# Patient Record
Sex: Male | Born: 1970 | Race: White | Hispanic: No | Marital: Married | Smoking: Never smoker
Health system: Southern US, Community
[De-identification: ages and names within clinical notes are randomized; demographics above are authoritative.]

## PROBLEM LIST (undated history)

## (undated) DIAGNOSIS — D696 Thrombocytopenia, unspecified: Secondary | ICD-10-CM

## (undated) DIAGNOSIS — K219 Gastro-esophageal reflux disease without esophagitis: Secondary | ICD-10-CM

## (undated) DIAGNOSIS — U071 COVID-19: Secondary | ICD-10-CM

## (undated) DIAGNOSIS — G459 Transient cerebral ischemic attack, unspecified: Secondary | ICD-10-CM

## (undated) DIAGNOSIS — E669 Obesity, unspecified: Secondary | ICD-10-CM

## (undated) DIAGNOSIS — G4730 Sleep apnea, unspecified: Secondary | ICD-10-CM

## (undated) DIAGNOSIS — K7460 Unspecified cirrhosis of liver: Secondary | ICD-10-CM

## (undated) DIAGNOSIS — D649 Anemia, unspecified: Secondary | ICD-10-CM

## (undated) DIAGNOSIS — M109 Gout, unspecified: Secondary | ICD-10-CM

## (undated) DIAGNOSIS — E7800 Pure hypercholesterolemia, unspecified: Secondary | ICD-10-CM

## (undated) DIAGNOSIS — E119 Type 2 diabetes mellitus without complications: Secondary | ICD-10-CM

## (undated) DIAGNOSIS — M549 Dorsalgia, unspecified: Principal | ICD-10-CM

## (undated) DIAGNOSIS — T8859XA Other complications of anesthesia, initial encounter: Secondary | ICD-10-CM

## (undated) DIAGNOSIS — G43909 Migraine, unspecified, not intractable, without status migrainosus: Secondary | ICD-10-CM

## (undated) DIAGNOSIS — I10 Essential (primary) hypertension: Secondary | ICD-10-CM

## (undated) DIAGNOSIS — I700 Atherosclerosis of aorta: Secondary | ICD-10-CM

## (undated) DIAGNOSIS — T7840XA Allergy, unspecified, initial encounter: Secondary | ICD-10-CM

## (undated) DIAGNOSIS — K769 Liver disease, unspecified: Secondary | ICD-10-CM

## (undated) DIAGNOSIS — G8929 Other chronic pain: Secondary | ICD-10-CM

## (undated) HISTORY — DX: Essential (primary) hypertension: I10

## (undated) HISTORY — DX: Dorsalgia, unspecified: M54.9

## (undated) HISTORY — PX: KNEE ARTHROSCOPY: SUR90

## (undated) HISTORY — DX: Allergy, unspecified, initial encounter: T78.40XA

## (undated) HISTORY — DX: Migraine, unspecified, not intractable, without status migrainosus: G43.909

## (undated) HISTORY — DX: Other chronic pain: G89.29

## (undated) HISTORY — PX: CHOLECYSTECTOMY: SHX55

## (undated) HISTORY — DX: Pure hypercholesterolemia, unspecified: E78.00

---

## 2003-01-16 ENCOUNTER — Encounter: Admission: RE | Admit: 2003-01-16 | Discharge: 2003-01-16 | Payer: Self-pay | Admitting: Neurosurgery

## 2003-01-16 ENCOUNTER — Encounter: Payer: Self-pay | Admitting: Neurosurgery

## 2003-02-20 ENCOUNTER — Encounter
Admission: RE | Admit: 2003-02-20 | Discharge: 2003-05-21 | Payer: Self-pay | Admitting: Physical Medicine & Rehabilitation

## 2003-06-12 ENCOUNTER — Encounter
Admission: RE | Admit: 2003-06-12 | Discharge: 2003-09-10 | Payer: Self-pay | Admitting: Physical Medicine & Rehabilitation

## 2003-09-20 ENCOUNTER — Encounter
Admission: RE | Admit: 2003-09-20 | Discharge: 2003-12-19 | Payer: Self-pay | Admitting: Physical Medicine & Rehabilitation

## 2004-08-10 ENCOUNTER — Other Ambulatory Visit: Payer: Self-pay

## 2004-08-19 ENCOUNTER — Ambulatory Visit: Payer: Self-pay | Admitting: Unknown Physician Specialty

## 2004-10-16 ENCOUNTER — Encounter: Payer: Self-pay | Admitting: Unknown Physician Specialty

## 2004-11-15 ENCOUNTER — Encounter: Payer: Self-pay | Admitting: Unknown Physician Specialty

## 2004-12-16 ENCOUNTER — Encounter: Payer: Self-pay | Admitting: Unknown Physician Specialty

## 2005-04-16 ENCOUNTER — Ambulatory Visit: Payer: Self-pay | Admitting: Physician Assistant

## 2006-08-10 ENCOUNTER — Other Ambulatory Visit: Payer: Self-pay

## 2006-08-10 ENCOUNTER — Emergency Department: Payer: Self-pay | Admitting: Emergency Medicine

## 2006-08-12 ENCOUNTER — Ambulatory Visit: Payer: Self-pay | Admitting: Emergency Medicine

## 2006-08-23 ENCOUNTER — Ambulatory Visit: Payer: Self-pay | Admitting: Anesthesiology

## 2006-08-26 ENCOUNTER — Ambulatory Visit: Payer: Self-pay | Admitting: Anesthesiology

## 2006-09-05 ENCOUNTER — Ambulatory Visit: Payer: Self-pay | Admitting: Anesthesiology

## 2006-09-28 ENCOUNTER — Ambulatory Visit: Payer: Self-pay | Admitting: Anesthesiology

## 2006-11-21 ENCOUNTER — Ambulatory Visit: Payer: Self-pay | Admitting: Anesthesiology

## 2007-01-24 ENCOUNTER — Ambulatory Visit: Payer: Self-pay | Admitting: Anesthesiology

## 2007-02-16 ENCOUNTER — Ambulatory Visit: Payer: Self-pay | Admitting: Unknown Physician Specialty

## 2007-06-12 ENCOUNTER — Other Ambulatory Visit: Payer: Self-pay

## 2007-06-12 ENCOUNTER — Ambulatory Visit: Payer: Self-pay | Admitting: Unknown Physician Specialty

## 2007-06-19 ENCOUNTER — Inpatient Hospital Stay: Payer: Self-pay | Admitting: Unknown Physician Specialty

## 2008-10-11 ENCOUNTER — Ambulatory Visit: Payer: Self-pay | Admitting: Family Medicine

## 2009-08-12 ENCOUNTER — Ambulatory Visit: Payer: Self-pay | Admitting: Unknown Physician Specialty

## 2010-08-14 ENCOUNTER — Ambulatory Visit: Payer: Self-pay | Admitting: Internal Medicine

## 2010-09-27 ENCOUNTER — Ambulatory Visit: Payer: Self-pay | Admitting: Internal Medicine

## 2010-10-14 ENCOUNTER — Ambulatory Visit: Payer: Self-pay

## 2011-02-07 ENCOUNTER — Emergency Department: Payer: Self-pay | Admitting: Emergency Medicine

## 2011-02-26 ENCOUNTER — Ambulatory Visit: Payer: Self-pay | Admitting: Unknown Physician Specialty

## 2011-03-16 ENCOUNTER — Ambulatory Visit: Payer: Self-pay | Admitting: Unknown Physician Specialty

## 2011-08-19 ENCOUNTER — Ambulatory Visit: Payer: Self-pay | Admitting: Internal Medicine

## 2011-09-10 ENCOUNTER — Ambulatory Visit: Payer: Self-pay | Admitting: Internal Medicine

## 2011-11-04 ENCOUNTER — Ambulatory Visit: Payer: Self-pay | Admitting: Internal Medicine

## 2012-01-11 ENCOUNTER — Ambulatory Visit: Payer: Self-pay | Admitting: Urology

## 2012-01-11 LAB — POTASSIUM: Potassium: 3.9 mmol/L (ref 3.5–5.1)

## 2012-01-19 ENCOUNTER — Ambulatory Visit: Payer: Self-pay | Admitting: Urology

## 2012-05-31 ENCOUNTER — Ambulatory Visit: Payer: Self-pay | Admitting: Pain Medicine

## 2012-08-21 ENCOUNTER — Other Ambulatory Visit: Payer: Self-pay | Admitting: Neurosurgery

## 2012-08-21 DIAGNOSIS — M545 Low back pain, unspecified: Secondary | ICD-10-CM

## 2012-08-23 ENCOUNTER — Ambulatory Visit
Admission: RE | Admit: 2012-08-23 | Discharge: 2012-08-23 | Disposition: A | Discharge status: Home / Self Care | Payer: BC Managed Care – PPO | Source: Ambulatory Visit | Attending: Neurosurgery | Admitting: Neurosurgery

## 2012-08-23 VITALS — BP 135/83 | HR 67

## 2012-08-23 DIAGNOSIS — M545 Low back pain, unspecified: Secondary | ICD-10-CM

## 2012-08-23 MED ORDER — DIAZEPAM 5 MG PO TABS
10.0000 mg | ORAL_TABLET | Freq: Once | ORAL | Status: AC
  Administered 2012-08-23: 10 mg via ORAL

## 2012-08-23 MED ORDER — HYDROMORPHONE HCL PF 2 MG/ML IJ SOLN
2.0000 mg | Freq: Once | INTRAMUSCULAR | Status: AC
  Administered 2012-08-23: 2 mg via INTRAMUSCULAR

## 2012-08-23 MED ORDER — OXYCODONE-ACETAMINOPHEN 5-325 MG PO TABS
2.0000 | ORAL_TABLET | Freq: Once | ORAL | Status: AC
  Administered 2012-08-23: 2 via ORAL

## 2012-08-23 MED ORDER — IOHEXOL 180 MG/ML  SOLN: 15.0000 mL | Freq: Once | INTRAMUSCULAR | Status: AC | PRN

## 2012-08-23 MED ORDER — ONDANSETRON HCL 4 MG/2ML IJ SOLN
4.0000 mg | Freq: Once | INTRAMUSCULAR | Status: AC
  Administered 2012-08-23: 4 mg via INTRAMUSCULAR

## 2012-09-11 ENCOUNTER — Telehealth: Payer: Self-pay | Admitting: Internal Medicine

## 2012-09-11 ENCOUNTER — Emergency Department: Payer: Self-pay | Admitting: Emergency Medicine

## 2012-09-11 LAB — ETHANOL
Ethanol %: <0.003 % (ref 0.000–0.080)
Ethanol: <3 mg/dL

## 2012-09-11 LAB — SALICYLATE LEVEL: Salicylates, Serum: <1.7 mg/dL

## 2012-09-11 LAB — COMPREHENSIVE METABOLIC PANEL
Albumin: 5 g/dL (ref 3.4–5.0)
Alkaline Phosphatase: 55 U/L (ref 50–136)
Anion Gap: 11 (ref 7–16)
BUN: 9 mg/dL (ref 7–18)
Bilirubin,Total: 1.6 mg/dL — ABNORMAL HIGH (ref 0.2–1.0)
Calcium, Total: 9.5 mg/dL (ref 8.5–10.1)
Chloride: 102 mmol/L (ref 98–107)
Co2: 27 mmol/L (ref 21–32)
Creatinine: 1.08 mg/dL (ref 0.60–1.30)
EGFR (African American): >60
EGFR (Non-African Amer.): >60
Glucose: 112 mg/dL — ABNORMAL HIGH (ref 65–99)
Osmolality: 279 (ref 275–301)
Potassium: 3.6 mmol/L (ref 3.5–5.1)
SGOT(AST): 50 U/L — ABNORMAL HIGH (ref 15–37)
SGPT (ALT): 80 U/L — ABNORMAL HIGH (ref 12–78)
Sodium: 140 mmol/L (ref 136–145)
Total Protein: 9 g/dL — ABNORMAL HIGH (ref 6.4–8.2)

## 2012-09-11 LAB — ACETAMINOPHEN LEVEL: Acetaminophen: <2 ug/mL

## 2012-09-11 LAB — DRUG SCREEN, URINE
Amphetamines, Ur Screen: NEGATIVE (ref ?–1000)
Barbiturates, Ur Screen: NEGATIVE (ref ?–200)
Benzodiazepine, Ur Scrn: NEGATIVE (ref ?–200)
Cannabinoid 50 Ng, Ur ~~LOC~~: NEGATIVE (ref ?–50)
Cocaine Metabolite,Ur ~~LOC~~: NEGATIVE (ref ?–300)
MDMA (Ecstasy)Ur Screen: NEGATIVE (ref ?–500)
Methadone, Ur Screen: NEGATIVE (ref ?–300)
Opiate, Ur Screen: POSITIVE (ref ?–300)
Phencyclidine (PCP) Ur S: NEGATIVE (ref ?–25)
Tricyclic, Ur Screen: NEGATIVE (ref ?–1000)

## 2012-09-11 LAB — CBC
HCT: 46 % (ref 40.0–52.0)
HGB: 16 g/dL (ref 13.0–18.0)
MCH: 30.1 pg (ref 26.0–34.0)
MCHC: 34.8 g/dL (ref 32.0–36.0)
MCV: 87 fL (ref 80–100)
Platelet: 261 10*3/uL (ref 150–440)
RBC: 5.31 10*6/uL (ref 4.40–5.90)
RDW: 12.8 % (ref 11.5–14.5)
WBC: 8.7 10*3/uL (ref 3.8–10.6)

## 2012-09-11 LAB — TSH: Thyroid Stimulating Horm: 1.88 u[IU]/mL

## 2012-09-11 NOTE — Telephone Encounter (Signed)
Pt called to see if he could be soon. His dr at [facility] left dr Bernette Redbird , todd Monday Pt had to go to er this morning to get pain meds. Pt stated he has not ate anything in 4 days

## 2012-09-11 NOTE — Telephone Encounter (Signed)
1. Patient stated that both of his back doctors have left [facility], he stated that he cannot get any pain meds until he sees a Psychiatrist.   2. He stated that he hasn't eaten anything in four days, went to ED this morning to see if he could get pain meds for back pain, they gave him twelve tablets of Vicodin and he stated that after those pills he doesn't know what else to do.   3. He states that he cannot sleep, he continues to think about his girlfriend.  He is staying out of work and he cannot concentrate.  Sounds like patient may be suffering from severe depression.  I spoke with Dr. Lorin Picket and she will call patient.  Patient can be reached on his cell phone at [phone].

## 2012-09-11 NOTE — Telephone Encounter (Signed)
I spoke to pt.  Discussed his current situation.  He informed me that since Dr Gerrit Heck left, he has been seeing another physician at [facility] (ortho).  They informed him they were going to stop prescribing pain meds and referred him to pain clinic in [place].  He was evaluated at Pain Clinic and was instructed to see a psychiatrist.  States he has called that psychiatrist three times and has been unable to arrange an appt.  He has subsequently followed up with Dr Gerlene Fee.  He rec pain management in [place] - for injections.  Pt is hesitant to have more injections given his "experience with previous injections".  He is seeing a psychiatrist now (through his work).  He has an appt with him tomorrow.  He does plan to keep the appt.  He was evaluated in the ER today because of increased pain and need of pain meds.  When asked if he was suicidal - he stated he had previously thought about suicide.  He explained to me that he did not and does not have a plan or desire to hurt or kill himself.  States that he was just frustrated with the pain.  He assures me that he would not and has no desire to hurt himself.  He is having some issues with his current relationship.  His girlfriend has moved out.  He is upset about this.  Plans to discuss this more with psychiatry tomorrow.  We discussed a possible plan of seeing the psychiatrist through work and seeing if he could work with the pain clinic physician.  He plans to discuss this with psychiatry tomorrow.  He is also going to call pain clinic tomorrow and explain the above situation.  He did agree to stay with his father tonight.  He stated he was comfortable with this plan.  Info provided for 24 hour coverage if needed for depression, etc issues.

## 2012-09-12 ENCOUNTER — Telehealth: Payer: Self-pay | Admitting: Internal Medicine

## 2012-09-12 NOTE — Telephone Encounter (Signed)
Pt went to Therapist but the therapist does not per scribe rx's. Therapist says pt is going through depression and needed to contact you for getting an rx. Pt says he isn't eating and vomiting because of the pain pills. [facility] [address]. We wanting to have something for the depression as soon as possible because he isn't doing so well.

## 2012-09-12 NOTE — Telephone Encounter (Signed)
Have tried multiple times to reach pt.  Unable.

## 2012-09-13 ENCOUNTER — Emergency Department: Payer: Self-pay | Admitting: Emergency Medicine

## 2012-09-13 LAB — COMPREHENSIVE METABOLIC PANEL
Albumin: 4.8 g/dL (ref 3.4–5.0)
Alkaline Phosphatase: 63 U/L (ref 50–136)
Anion Gap: 10 (ref 7–16)
BUN: 10 mg/dL (ref 7–18)
Bilirubin,Total: 1.5 mg/dL — ABNORMAL HIGH (ref 0.2–1.0)
Calcium, Total: 9.7 mg/dL (ref 8.5–10.1)
Chloride: 100 mmol/L (ref 98–107)
Co2: 27 mmol/L (ref 21–32)
Creatinine: 0.94 mg/dL (ref 0.60–1.30)
EGFR (African American): >60
EGFR (Non-African Amer.): >60
Glucose: 94 mg/dL (ref 65–99)
Osmolality: 273 (ref 275–301)
Potassium: 3.7 mmol/L (ref 3.5–5.1)
SGOT(AST): 27 U/L (ref 15–37)
SGPT (ALT): 71 U/L (ref 12–78)
Sodium: 137 mmol/L (ref 136–145)
Total Protein: 8.7 g/dL — ABNORMAL HIGH (ref 6.4–8.2)

## 2012-09-13 LAB — DRUG SCREEN, URINE
Amphetamines, Ur Screen: NEGATIVE (ref ?–1000)
Barbiturates, Ur Screen: NEGATIVE (ref ?–200)
Benzodiazepine, Ur Scrn: NEGATIVE (ref ?–200)
Cannabinoid 50 Ng, Ur ~~LOC~~: NEGATIVE (ref ?–50)
Cocaine Metabolite,Ur ~~LOC~~: NEGATIVE (ref ?–300)
MDMA (Ecstasy)Ur Screen: NEGATIVE (ref ?–500)
Methadone, Ur Screen: NEGATIVE (ref ?–300)
Opiate, Ur Screen: POSITIVE (ref ?–300)
Phencyclidine (PCP) Ur S: NEGATIVE (ref ?–25)
Tricyclic, Ur Screen: NEGATIVE (ref ?–1000)

## 2012-09-13 LAB — AMYLASE: Amylase: 61 U/L (ref 25–115)

## 2012-09-13 LAB — CBC WITH DIFFERENTIAL/PLATELET
Basophil #: 0.1 10*3/uL (ref 0.0–0.1)
Basophil %: 1 %
Eosinophil #: 0.1 10*3/uL (ref 0.0–0.7)
Eosinophil %: 1.1 %
HCT: 45.3 % (ref 40.0–52.0)
HGB: 15.7 g/dL (ref 13.0–18.0)
Lymphocyte #: 3.8 10*3/uL — ABNORMAL HIGH (ref 1.0–3.6)
Lymphocyte %: 40.6 %
MCH: 30.1 pg (ref 26.0–34.0)
MCHC: 34.8 g/dL (ref 32.0–36.0)
MCV: 87 fL (ref 80–100)
Monocyte #: 0.9 x10 3/mm (ref 0.2–1.0)
Monocyte %: 10.1 %
Neutrophil #: 4.4 10*3/uL (ref 1.4–6.5)
Neutrophil %: 47.2 %
Platelet: 214 10*3/uL (ref 150–440)
RBC: 5.23 10*6/uL (ref 4.40–5.90)
RDW: 12.8 % (ref 11.5–14.5)
WBC: 9.4 10*3/uL (ref 3.8–10.6)

## 2012-09-13 LAB — URINALYSIS, COMPLETE
Bacteria: NONE SEEN
Bilirubin,UR: NEGATIVE
Blood: NEGATIVE
Glucose,UR: NEGATIVE mg/dL (ref 0–75)
Ketone: NEGATIVE
Leukocyte Esterase: NEGATIVE
Nitrite: NEGATIVE
Ph: 6 (ref 4.5–8.0)
Protein: NEGATIVE
RBC,UR: <1 /HPF (ref 0–5)
Specific Gravity: 1.017 (ref 1.003–1.030)
Squamous Epithelial: NONE SEEN
WBC UR: <1 /HPF (ref 0–5)

## 2012-09-13 LAB — ETHANOL
Ethanol %: <0.003 % (ref 0.000–0.080)
Ethanol: <3 mg/dL

## 2012-09-13 LAB — LIPASE, BLOOD: Lipase: 398 U/L — ABNORMAL HIGH (ref 73–393)

## 2012-09-13 NOTE — Telephone Encounter (Signed)
Pt returned your call  He stated to try number [phone]

## 2012-09-13 NOTE — Telephone Encounter (Signed)
I called Carollee Herter to discuss current symptoms.  Please also refer to my previous phone message.  He saw Dr Letta Moynahan today.  Dr Letta Moynahan is going to contact pain management (Dr Shirlyn Goltz) and let him know pt followed through with the appt.  Pt concerned he is out of pain meds.  See my last note regarding plan for follow up at pain clinic.  He contacted pain clinic late this pm.  He is concerned regarding not eating and not being able to keep po liquids or food down.  He is also having diarrhea.  States he hasn't eaten or been able to drink for the past few days.  Discussed current clinical condition.  Discussed withdrawal symptoms and also discussed other possible etiologies.  I do agree with need for evaluation today.  May need fluids, detox, and further testing to confirm nothing else contributing to these symptoms.  He states he discussed these current symptoms with Dr Letta Moynahan.  Discussed with him about withdrawal symptoms.  He does not feel these symptoms are coming from depression.  He denies any suicidal ideations.  Expressed to me that his main concern was "how he was going to get his pain meds".   I expressed my concern regarding the not eating and not sleeping.  Advised him - he needed evaluation today.  He agreed to go to acute care for evaluation. I explained to him that acute care may assess him and refer him over to ER for evaluation and workup.  He was in agreement with this plan.  Needs to contact pain clinics office as well.

## 2012-09-13 NOTE — Telephone Encounter (Signed)
Caller: [name]/Patient; Patient Name: Cameron Sanchez; PCP: Dale [place]; Best Callback Phone Number: [phone]; Calling today 09/13/12 regarding missed call from MD, cell phone not working.  Triager pulled chart in EPIC and see message from yesterday regarding MD trying to reach pt.  No note left in chart for triage RN to relay to pt.  Going through withdrawals because does not have pain medication, not eating.  Can't keep fluids down.  Has been trying to contact MD to get refill or instructions on what to do  Keep missing calls from MD. Emergent symptoms r/o by Withdrawal Symptoms guidelines with exception of continuous or repeated vomiting for more than 8 hours AND unable to keep any fluids down.  Pt has flat affect to his voice. (See Provider Within 4 Hours).  Care advice given.  No appts available in office.  OFFICE PLEASE CALL PT BACK AT [phone] TO ADVISE IF CAN BE WORKED IN OR MD IS GOING TO CALL IN MEDICATION FOR HIM.

## 2012-09-14 ENCOUNTER — Telehealth: Payer: Self-pay

## 2012-09-14 ENCOUNTER — Telehealth: Payer: Self-pay | Admitting: Internal Medicine

## 2012-09-14 ENCOUNTER — Telehealth: Payer: Self-pay | Admitting: *Deleted

## 2012-09-14 NOTE — Telephone Encounter (Signed)
Patient called and he wishes to speck to you.

## 2012-09-14 NOTE — Telephone Encounter (Signed)
Notify pt that I called pain clinic and explained the situation.  They are supposed to be calling him with an appt.  I also need the ER records from last night - states he went to [facility] last night.  (to review) - to see if they gave him anything for nausea, etc.

## 2012-09-14 NOTE — Telephone Encounter (Signed)
Did  give him something for nausea, but nothing else. He wants a call back today with your reply.

## 2012-09-14 NOTE — Telephone Encounter (Signed)
Patient called about what he should do he has not had anything to eat in 3 days because he does not have an appetite  i  informed him of your notes. And he is not vomitting just only drinking water.

## 2012-09-14 NOTE — Telephone Encounter (Signed)
See other message.  Can conclude this one.

## 2012-09-14 NOTE — Telephone Encounter (Signed)
Pt is calling back he went to Acute care and they sent him to hospital. He is saying that they still haven't done anything for him and he is till not eating anything. He is not sure what to do at this point ??? He does apologize for having to keep calling back but he is scared because he isn't eating and he knows his pain meds are causing some of the problem.

## 2012-09-14 NOTE — Telephone Encounter (Signed)
Dr.Scott,  Cameron Sanchez called and stated that he is still unable drink and eat.  He went to [facility], and said they did nothing for him. How should I handle this?  Patients DOB: 11/08/[phone]

## 2012-09-14 NOTE — Telephone Encounter (Signed)
See my other message on this patient.  He called this am also with same question.  Thanks.

## 2012-09-14 NOTE — Telephone Encounter (Signed)
Called pt and discussed the current situation.  He is drinking some fluids now.  Informed him to get gatorade or pediolyte and sip.  Try to start eating crackers/toast, etc.  He does have an appt with pain clinic next Tuesday.  I explained to him that I would contact ortho tomorrow and make them aware of the situation.  He informed me that a psychiatrist saw him last night in the ER and mentioned detox.  He was subsequently discharged with (per pt) an unknown plan.  I discussed with him that if his symptoms worsened - I did want him to return to er for eval/treatment and detox.  He feels this is all related to not having the pain meds.  No suicidal ideations.  Will be evaluated if problems.

## 2012-09-14 NOTE — Telephone Encounter (Signed)
Notify pt i will review the er records and see what they checked, etc.   I did explain the situation to pain clinic. They will notify him of an appt.  Have him take the med if needed for nausea and try sipping clear fluids and eating bland foods.  I will also notify ortho of the situation since Dr Gerrit Heck had previously been following.  If any acute change or problems rec reevaluation.

## 2012-09-15 ENCOUNTER — Telehealth: Payer: Self-pay | Admitting: Internal Medicine

## 2012-09-15 NOTE — Telephone Encounter (Signed)
Notify pt that I did review er records.  Labs looked good.  As we discussed last night - if he is continuing to have these problems - I do rec another evaluation - had stated would go to er if worsened.  rec to er for eval.  If he asked about ortho, let him know I did call them and made them aware of the situation.

## 2012-09-15 NOTE — Telephone Encounter (Signed)
Advised patient per Dr.Scott. Patient stated that he was going to seek more eval at [facility]

## 2012-09-15 NOTE — Telephone Encounter (Signed)
Caller: Kadeem/Patient; Patient Name: Cameron Sanchez; PCP: Dale [place]; Best Callback Phone Number: [phone]. States that he has been talking to Dr. Lorin Picket about vomiting and other symptoms this week. Is still not able to keep down any food. Is feeling weak and having cramping to abdomen and legs. Last urination was this morning at 0930. Has had half of a sweet tea from [employer] that was brought to him this morning. Is dizzy upon standing. States that someone from office was supposed to call him regarding recent ED visit. States that Dr. Lorin Picket told him she would get records from ED, review them and call him back. No one has called him.  Has not been able to eat in 5 days. Reviewed notes in EPIC. Advised I would send this note to office for follow up. OFFICE PLEASE FOLLOW UP WITH PATIENT TODAY.

## 2012-09-19 ENCOUNTER — Ambulatory Visit: Payer: Self-pay | Admitting: Pain Medicine

## 2012-10-05 ENCOUNTER — Ambulatory Visit: Payer: Self-pay | Admitting: Pain Medicine

## 2012-10-06 ENCOUNTER — Telehealth: Payer: Self-pay | Admitting: Internal Medicine

## 2012-10-06 NOTE — Telephone Encounter (Signed)
Caller: Gavriel/Patient; Phone: [phone]; Reason for Call: Patient following Dr.  Lorin Picket from [facility]; states needs refill of BP medication but has not been seen in this office yet.  Caller transferred to staff for assistance.  Krs/can

## 2012-10-06 NOTE — Telephone Encounter (Signed)
Spoke with Cameron Sanchez and explained to him that I was not sure why the do not schedule message had been entered into the record and would follow-up with the person that entered the message on Monday. advised him that Dr. Lorin Picket was not here and that [facility] should refill his BP med because he has not established with Dr. Lorin Picket he stated that he had called and  they would not refill. Advised that he could be seen at an Urgent Care for his BP and they would refill his medication. He is agreeable and  will be seen at a local Urgent Care.

## 2012-10-07 NOTE — Telephone Encounter (Signed)
This pt had a note on his chart to "not schedule with [employer]  Ermalinda Barrios spoke to him on Friday.  I am not sure if she left a message with you regarding this pt.  Thanks.

## 2012-10-09 ENCOUNTER — Telehealth: Payer: Self-pay | Admitting: Internal Medicine

## 2012-10-09 NOTE — Telephone Encounter (Signed)
I spoke with Cameron Sanchez today and advised him that Dr. Lorin Picket would see him on Wednesday. He states he is completely out of his Blood pressure medicine. [facility] [place] and he takes topral 50mg  and Tamazin HCTZ 37.5 mg. Please call in this medication.

## 2012-10-10 ENCOUNTER — Other Ambulatory Visit: Payer: Self-pay | Admitting: *Deleted

## 2012-10-10 NOTE — Telephone Encounter (Signed)
Called prescription in to pharmacy 

## 2012-10-11 ENCOUNTER — Ambulatory Visit: Payer: BC Managed Care – PPO | Admitting: Internal Medicine

## 2012-10-30 ENCOUNTER — Encounter: Payer: Self-pay | Admitting: *Deleted

## 2012-10-30 DIAGNOSIS — I10 Essential (primary) hypertension: Secondary | ICD-10-CM

## 2012-10-30 DIAGNOSIS — E7800 Pure hypercholesterolemia, unspecified: Secondary | ICD-10-CM

## 2012-10-30 DIAGNOSIS — M549 Dorsalgia, unspecified: Secondary | ICD-10-CM

## 2012-10-30 DIAGNOSIS — G8929 Other chronic pain: Secondary | ICD-10-CM

## 2012-10-31 ENCOUNTER — Telehealth: Payer: Self-pay | Admitting: Internal Medicine

## 2012-10-31 ENCOUNTER — Ambulatory Visit: Payer: BC Managed Care – PPO | Admitting: Internal Medicine

## 2012-10-31 ENCOUNTER — Encounter: Payer: Self-pay | Admitting: *Deleted

## 2012-10-31 DIAGNOSIS — E7800 Pure hypercholesterolemia, unspecified: Secondary | ICD-10-CM | POA: Insufficient documentation

## 2012-10-31 DIAGNOSIS — G8929 Other chronic pain: Secondary | ICD-10-CM | POA: Insufficient documentation

## 2012-10-31 DIAGNOSIS — I10 Essential (primary) hypertension: Secondary | ICD-10-CM | POA: Insufficient documentation

## 2012-10-31 NOTE — Telephone Encounter (Signed)
Pt is calling and cancelling appt again. He says he was called into work and is just not calling at 8:45 am to cancel. He is wanting his b/p meds refilled and we told him the next new patient was in in march but we would ask you to see what we needed to do.

## 2012-10-31 NOTE — Telephone Encounter (Signed)
Made appointment for patient. Called to let him know and also let him know that he needs to keep this appointment or we will not continue to offer care.

## 2012-10-31 NOTE — Telephone Encounter (Signed)
I can see him at 11:30 on 11/22/12.  He needs to keep this appt.  I am not going to continue to refill medication and not see him.  If he can come on this day - will need to schedule appt.

## 2012-11-22 ENCOUNTER — Encounter: Payer: Self-pay | Admitting: Internal Medicine

## 2012-11-22 ENCOUNTER — Encounter: Payer: Self-pay | Admitting: *Deleted

## 2012-11-22 ENCOUNTER — Ambulatory Visit (INDEPENDENT_AMBULATORY_CARE_PROVIDER_SITE_OTHER): Payer: BC Managed Care – PPO | Admitting: Internal Medicine

## 2012-11-22 VITALS — BP 112/60 | HR 83 | Temp 98.5°F | Ht 68.0 in | Wt 186.2 lb

## 2012-11-22 DIAGNOSIS — E7800 Pure hypercholesterolemia, unspecified: Secondary | ICD-10-CM

## 2012-11-22 DIAGNOSIS — M549 Dorsalgia, unspecified: Secondary | ICD-10-CM

## 2012-11-22 DIAGNOSIS — R5383 Other fatigue: Secondary | ICD-10-CM

## 2012-11-22 DIAGNOSIS — G8929 Other chronic pain: Secondary | ICD-10-CM

## 2012-11-22 DIAGNOSIS — R5381 Other malaise: Secondary | ICD-10-CM

## 2012-11-22 DIAGNOSIS — R079 Chest pain, unspecified: Secondary | ICD-10-CM

## 2012-11-22 DIAGNOSIS — I10 Essential (primary) hypertension: Secondary | ICD-10-CM

## 2012-11-24 ENCOUNTER — Other Ambulatory Visit: Payer: Self-pay | Admitting: *Deleted

## 2012-11-24 MED ORDER — AMOXICILLIN 875 MG PO TABS: 875.0000 mg | ORAL_TABLET | Freq: Two times a day (BID) | ORAL | Status: DC

## 2012-11-24 MED ORDER — FLUTICASONE PROPIONATE 50 MCG/ACT NA SUSP: 2.0000 | Freq: Every day | NASAL | Status: DC

## 2012-11-24 NOTE — Telephone Encounter (Signed)
Called prescription in to pharmacy 

## 2012-11-27 ENCOUNTER — Other Ambulatory Visit: Payer: BC Managed Care – PPO

## 2012-11-27 ENCOUNTER — Encounter: Payer: Self-pay | Admitting: Internal Medicine

## 2012-11-27 NOTE — Assessment & Plan Note (Signed)
Has not been watching what he eats.  Low cholesterol diet and exercise.  Check lipid panel.

## 2012-11-27 NOTE — Assessment & Plan Note (Signed)
Blood pressure under good control.  Same medication regimen.  Check metabolic panel.    

## 2012-11-27 NOTE — Assessment & Plan Note (Signed)
Now seeing Dr Gerrit Heck.  Discussed with him today regarding the need to cut back on the narcotics.  We have discussed detox.  He is now seeing Dr Gerrit Heck.

## 2012-11-27 NOTE — Progress Notes (Signed)
Subjective:    [name], male    DOB: 1970-12-08, 42 y.o.   MRN: [id number]  HPI 42 year old male with past history of hypertension, hypercholesterolemia, sleep apnea and chronic back pain who comes in today for a scheduled follow up.  He has been going to pain clinic.  Received two injections.  Did not help.  Now is seeing Dr Gerrit Heck.  He hurt his back at work.  He is currently under workman's comp for this.  Dr Gerrit Heck is giving him hydrocodone.  He apparently is out now and is taking some oxycodone.  He can't have his hydrocodone filled until 11/28/12.  We discussed the need to cut down the amount of narcotics he is taking.  Have tried to get him in detox.  His back is doing some better from the injury, but still has the chronic pain issues.  He also states that starting 11/04/12 he developed body aches, ears stopped up, increased congestion and cough.  Was seen at urgent care and given an abx and cough medication.  He is better, but still is having increased cough and congestion and drainage.    He also reports that on 11/15/12, he was driving and developed chest pain and left arm pain.  Pulled over.  Pain persisted.  He went to work and pain started easing off.  He has noticed getting winded with increased activity or exertion.  Had not had any other episodes of chest pain since occurrence, until sitting in the office today.  The initial EKG - no pain.  A second EKG was done secondary to some mild chest and arm discomfort he experienced while sitting here in the office.  Pain resolved quickly.  Declined ER evaluation.  Did agree to cardiology evaluation and they agreed to see him today.    Past Medical History  Diagnosis Date  . Migraines   . Allergy   . Hypertension   . Hypercholesterolemia   . Chronic back pain     Current Outpatient Prescriptions on File Prior to Visit  Medication Sig Dispense Refill  . carisoprodol (SOMA) 350 MG tablet Take 350 mg by mouth 4 (four) times daily  as needed.      . doxepin (SINEQUAN) 10 MG capsule Take 10 mg by mouth daily.      . fish oil-omega-3 fatty acids 1000 MG capsule Take 2 g by mouth daily.      . metoprolol succinate (TOPROL-XL) 50 MG 24 hr tablet Take 50 mg by mouth daily. Take with or immediately following a meal.      . oxyCODONE (OXYCONTIN) 20 MG 12 hr tablet Take 20 mg by mouth 2 (two) times daily.      Marland Kitchen triamterene-hydrochlorothiazide (MAXZIDE-25) 37.5-25 MG per tablet Take 1 tablet by mouth daily.      . fluticasone (FLONASE) 50 MCG/ACT nasal spray Place 2 sprays into the nose daily.  16 g  5  . gabapentin (NEURONTIN) 300 MG capsule Take 300 mg by mouth at bedtime. 2 pills at bedtime      . hydrocodone-acetaminophen (LORCET-HD) 5-500 MG per capsule Take 1 capsule by mouth every 6 (six) hours as needed.        Review of Systems Patient denies any headache, lightheadedness or dizziness. Does report the increased congestion, cough and drainage. Ears stopped up.  Describes the chest pain as outlined.  No sob associated.  Does report increased sob with exertion.  No nausea or vomiting.  No abdominal  pain or cramping.  No bowel change, such as diarrhea, constipation, BRBPR or melana.  No urine change.        Objective:   Physical Exam Filed Vitals:   11/22/12 1156  BP: 112/60  Pulse: 83  Temp: 98.5 F (39.74 C)   42 year old male in no acute distress.   HEENT:  Nares - clear.  OP- without lesions or erythema.  NECK:  Supple, nontender.  No audible bruit.   HEART:  Appears to be regular. LUNGS:  Without crackles or wheezing audible.  Respirations even and unlabored.   RADIAL PULSE:  Equal bilaterally.  ABDOMEN:  Soft, nontender.  No audible abdominal bruit.   EXTREMITIES:  No increased edema to be present.                  Assessment & Plan:  CARDIOVASCULAR.  Chest pain as outlined.  EKG x 2 revealed no acute ischemic changes.  Has risk factors.  Discussed at length with him today regarding the need for further  cardiac w/up.  He declined ER evaluation today.  Did agree to see cardiology.  Cardiology agreed to see him today.  He was pain free prior to leaving the office and agreed if he had any more pain - he would be evaluated in the ER.  Seeing Rudi Coco today.    PROBABLE SINUSITIS/URI.  Already partially treated -with residual symptoms.  Extend out the abx.  Will prescribe amoxicillin 875mg  bid x 1 week.  Flonase nasal spray as directed.    FATIGUE.  Check cbc, met c and tsh.

## 2013-01-01 ENCOUNTER — Telehealth: Payer: Self-pay | Admitting: Internal Medicine

## 2013-01-01 NOTE — Telephone Encounter (Signed)
Appointment scheduled for 01/02/13.

## 2013-01-01 NOTE — Telephone Encounter (Signed)
Patient Information:  Caller Name: Lukas  Phone: [phone]  Patient: Cameron Sanchez, Cameron Sanchez  Gender: Male  DOB: Mar 20, 1971  Age: 42 Years  PCP: [facility] [place]  Office Follow Up:  Does the office need to follow up with this patient?: No  Instructions For The Office: N/A  RN Note:  No appt found for today (01/01/13); Rn looked for appt for tomorrow (01/02/13) and pt needed late appt because of work.  Symptoms  Reason For Call & Symptoms: pt reports he has been to the walk in clinic twice in [place].  Pt reports he has been on antibiotic for a week and it is getting worse  Reviewed Health History In EMR: Yes  Reviewed Medications In EMR: Yes  Reviewed Allergies In EMR: Yes  Reviewed Surgeries / Procedures: Yes  Date of Onset of Symptoms: 12/20/2012  Treatments Tried: antibiotic, Mucinex  Treatments Tried Worked: No  Guideline(s) Used:  Earache  Disposition Per Guideline:   See Today in Office  Reason For Disposition Reached:   All other earaches (Exceptions: earache lasting < 1 hour, and earache from air travel)  Advice Given:  Pain Medicines:  For pain relief, you can take either acetaminophen, ibuprofen, or naproxen.  Call Back If  You become worse.  Appointment Scheduled:  01/02/2013 15:00:00 Appointment Scheduled Provider:  Orville Govern

## 2013-01-02 ENCOUNTER — Ambulatory Visit: Payer: Self-pay | Admitting: Adult Health

## 2013-01-08 ENCOUNTER — Ambulatory Visit: Payer: BC Managed Care – PPO | Admitting: Internal Medicine

## 2013-02-20 ENCOUNTER — Ambulatory Visit: Payer: BC Managed Care – PPO | Admitting: Internal Medicine

## 2013-04-12 ENCOUNTER — Other Ambulatory Visit: Payer: Self-pay | Admitting: Internal Medicine

## 2013-04-12 NOTE — Telephone Encounter (Signed)
He missed his last appt with me and he has not shown up for his labs.  The labs are ordered.  He needs to come in within the next week to have his labs checked.  Also he needs a f/u appt with me.  I will refill the meds #30 with no refills - to give him time to make these appts.  Sent in to pharmacy.  Please notify pt of need for appts.

## 2013-04-12 NOTE — Telephone Encounter (Signed)
Patient wants refills on metoprolol succinate and triamterene-hydrochlorothiazide. Is it ok to refill both or just one?

## 2013-05-16 ENCOUNTER — Other Ambulatory Visit: Payer: Self-pay | Admitting: Internal Medicine

## 2013-08-23 ENCOUNTER — Emergency Department (HOSPITAL_COMMUNITY): Payer: Worker's Compensation

## 2013-08-23 ENCOUNTER — Encounter (HOSPITAL_COMMUNITY): Payer: Self-pay | Admitting: Emergency Medicine

## 2013-08-23 ENCOUNTER — Emergency Department (HOSPITAL_COMMUNITY)
Admission: EM | Admit: 2013-08-23 | Discharge: 2013-08-23 | Disposition: A | Discharge status: Home / Self Care | Payer: Worker's Compensation | Attending: Emergency Medicine | Admitting: Emergency Medicine

## 2013-08-23 DIAGNOSIS — X503XXA Overexertion from repetitive movements, initial encounter: Secondary | ICD-10-CM | POA: Insufficient documentation

## 2013-08-23 DIAGNOSIS — I10 Essential (primary) hypertension: Secondary | ICD-10-CM | POA: Insufficient documentation

## 2013-08-23 DIAGNOSIS — X500XXA Overexertion from strenuous movement or load, initial encounter: Secondary | ICD-10-CM | POA: Insufficient documentation

## 2013-08-23 DIAGNOSIS — G43909 Migraine, unspecified, not intractable, without status migrainosus: Secondary | ICD-10-CM | POA: Insufficient documentation

## 2013-08-23 DIAGNOSIS — S8992XA Unspecified injury of left lower leg, initial encounter: Secondary | ICD-10-CM

## 2013-08-23 DIAGNOSIS — Y929 Unspecified place or not applicable: Secondary | ICD-10-CM | POA: Insufficient documentation

## 2013-08-23 DIAGNOSIS — Z862 Personal history of diseases of the blood and blood-forming organs and certain disorders involving the immune mechanism: Secondary | ICD-10-CM | POA: Insufficient documentation

## 2013-08-23 DIAGNOSIS — Z79899 Other long term (current) drug therapy: Secondary | ICD-10-CM | POA: Insufficient documentation

## 2013-08-23 DIAGNOSIS — S8990XA Unspecified injury of unspecified lower leg, initial encounter: Secondary | ICD-10-CM | POA: Insufficient documentation

## 2013-08-23 DIAGNOSIS — G8929 Other chronic pain: Secondary | ICD-10-CM | POA: Insufficient documentation

## 2013-08-23 DIAGNOSIS — Y9389 Activity, other specified: Secondary | ICD-10-CM | POA: Insufficient documentation

## 2013-08-23 DIAGNOSIS — Z8639 Personal history of other endocrine, nutritional and metabolic disease: Secondary | ICD-10-CM | POA: Insufficient documentation

## 2013-08-23 DIAGNOSIS — Z9889 Other specified postprocedural states: Secondary | ICD-10-CM | POA: Insufficient documentation

## 2013-08-23 MED ORDER — OXYCODONE-ACETAMINOPHEN 5-325 MG PO TABS
2.0000 | ORAL_TABLET | Freq: Once | ORAL | Status: AC
  Administered 2013-08-23: 2 via ORAL
  Filled 2013-08-23: qty 2

## 2013-08-23 MED ORDER — OXYCODONE HCL 10 MG PO TABS: 10.0000 mg | ORAL_TABLET | Freq: Three times a day (TID) | ORAL | Status: DC

## 2013-08-23 NOTE — ED Provider Notes (Signed)
CSN: [id number]     Arrival date & time 08/23/13  1203 History  This chart was scribed for Cameron Helper, PA,  working with Candyce Churn, MD, by Allene Dillon, ED Scribe. This patient was seen in room TR07C/TR07C and the patient's care was started at 12:40 PM.   Chief Complaint  Patient presents with  . Knee Injury   The history is provided by the patient. No language interpreter was used.   HPI Comments: Cameron Sanchez is a 42 y.o. male who presents to the Emergency Department complaining of sudden onset, constant left knee pain which began approximately 2 hours ago. 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 Pt heard a "popping" sound during the incident. Pt has associated swelling. Pt is unable to ambulate. Pt took oxycodone for his chronic back pain 5 hours ago before incident but has not tried any other medication. Pt states he previously had orthoscopic surgery on knee due to previous injury at work. Pt denies numbness or any other symptoms.   Past Medical History  Diagnosis Date  . Migraines   . Allergy   . Hypertension   . Hypercholesterolemia   . Chronic back pain    Past Surgical History  Procedure Laterality Date  . Cholecystectomy    . Back surgery      multiple  . Knee arthroscopy      Dr HBK   Family History  Problem Relation Age of Onset  . Cancer Father     germ cell (retroperitoneal)  . Coronary artery disease      s/p stent (age 67)  . Hypertension Paternal Grandfather   . Kidney cancer Paternal Grandfather   . Diabetes Paternal Grandfather   . Diabetes Maternal Grandfather   . Heart disease Cousin     MI - age 67   History  Substance Use Topics  . Smoking status: Never Smoker   . Smokeless tobacco: Never Used  . Alcohol Use: No    Review of Systems  Musculoskeletal: Positive for gait problem and myalgias.  Skin: Negative for wound.  Neurological: Negative for numbness.    Allergies  Review of patient's allergies  indicates no known allergies.  Home Medications   Current Outpatient Rx  Name  Route  Sig  Dispense  Refill  . carisoprodol (SOMA) 350 MG tablet   Oral   Take 350 mg by mouth 4 (four) times daily as needed for muscle spasms.          Marland Kitchen HYDROcodone-acetaminophen (NORCO/VICODIN) 5-325 MG per tablet   Oral   Take 1 tablet by mouth every 6 (six) hours as needed for pain.         . metoprolol succinate (TOPROL-XL) 50 MG 24 hr tablet   Oral   Take 50 mg by mouth daily. Take with or immediately following a meal.         . Oxycodone HCl 10 MG TABS   Oral   Take 10 mg by mouth 3 (three) times daily.         Marland Kitchen triamterene-hydrochlorothiazide (MAXZIDE-25) 37.5-25 MG per tablet   Oral   Take 1 tablet by mouth daily.          Triage Vitals: BP 159/106  Pulse 76  Temp(Src) 98.6 F (37 C) (Oral)  Resp 12  SpO2 96%  Physical Exam  Nursing note and vitals reviewed. Constitutional: He is oriented to person, place, and time. He appears well-developed and  well-nourished. No distress.  HENT:  Head: Normocephalic and atraumatic.  Right Ear: External ear normal.  Left Ear: External ear normal.  Nose: Nose normal.  Eyes: Conjunctivae are normal.  Neck: Normal range of motion. No tracheal deviation present.  Cardiovascular: Normal rate, regular rhythm and normal heart sounds.   Pulmonary/Chest: Effort normal and breath sounds normal. No stridor.  Abdominal: Soft. He exhibits no distension. There is no tenderness.  Musculoskeletal: Normal range of motion. He exhibits tenderness.  Full ROM to left knee. protuberance over the overlying tibial tuberosity .  Tender to palpation. Moderate tenderness to the infrapatellar region. No obvious deformity.  Negative anterior and posterior draw test. No significant  pain with cavus and varus maneuver.  Neurological: He is alert and oriented to person, place, and time.  Skin: Skin is warm and dry. He is not diaphoretic.  Psychiatric: He has a  normal mood and affect. His behavior is normal.    ED Course  Procedures (including critical care time)  DIAGNOSTIC STUDIES: Oxygen Saturation is 96% on RA, adequate by my interpretation.    COORDINATION OF CARE: 12:44 PM- Pt advised of plan for treatment which includes knee sleeve, crutches, x-ray, percocet and pt agrees.  1:44 PM Xray neg for acute fx/dislocation.  WIll prescribe a short course of pain meds along with RICE therapy.  Ortho referral for further care.  Pt voice understanding.    Medications  oxyCODONE-acetaminophen (PERCOCET/ROXICET) 5-325 MG per tablet 2 tablet (2 tablets Oral Given 08/23/13 1252)     Labs Review Labs Reviewed - No data to display Imaging Review Dg Knee Complete 4 Views Left  08/23/2013   CLINICAL DATA:  Knee popped while lifting.  EXAM: LEFT KNEE - COMPLETE 4+ VIEW  COMPARISON:  None.  FINDINGS: No fracture or dislocation.  No plain film evidence of joint effusion  IMPRESSION: No fracture or dislocation.   Electronically Signed   By: Bridgett Larsson M.D.   On: 08/23/2013 13:16    EKG Interpretation   None       MDM   1. Left knee injury, initial encounter    BP 159/106  Pulse 76  Temp(Src) 98.6 F (37 C) (Oral)  Resp 12  SpO2 96%  I have reviewed nursing notes and vital signs. I personally reviewed the imaging tests through PACS system  I reviewed available ER/hospitalization records thought the EMR  I personally performed the services described in this documentation, which was scribed in my presence. The recorded information has been reviewed and is accurate.     Cameron Helper, PA-C 08/23/13 1345

## 2013-08-23 NOTE — ED Notes (Signed)
Pt felt L knee pop while pushing a cart.  Unable to ambulate d/t pain.  Denies numbness or tingling to lower extremity.

## 2013-08-25 NOTE — ED Provider Notes (Signed)
Medical screening examination/treatment/procedure(s) were performed by non-physician practitioner and as supervising physician I was immediately available for consultation/collaboration.   Halston Kintz David Sam Wunschel, MD 08/25/13 1508 

## 2013-11-12 ENCOUNTER — Other Ambulatory Visit: Payer: Self-pay | Admitting: Internal Medicine

## 2013-11-12 NOTE — Telephone Encounter (Signed)
Last lab 10/13, last visit 11/22/12, needs to be seen first prior to refills?

## 2013-11-12 NOTE — Telephone Encounter (Signed)
He missed his last appt with me.  He needs an appt to be seen and needs labs.  Can schedule fasting labs within one week and appt asap.  Will refill x 1 only if he agrees to appt and labs.  Has to show up to appt or I will not continue to refill

## 2013-11-13 NOTE — Telephone Encounter (Addendum)
Spoke with pt, made aware of need for labs and appt. Agrees to schedule labs and apt with Dr. Lorin Picket. Rx sent to pharmacy by escript for one month only. Made aware he will need to keep appointment for further refills. Fasting lab appt scheduled 11/21/12. Appt with Dr. Lorin Picket 12/12/13. Any additional labs need ordered?

## 2013-11-17 ENCOUNTER — Other Ambulatory Visit: Payer: Self-pay | Admitting: Internal Medicine

## 2013-11-21 ENCOUNTER — Other Ambulatory Visit: Payer: Self-pay | Admitting: Internal Medicine

## 2013-11-21 ENCOUNTER — Other Ambulatory Visit (INDEPENDENT_AMBULATORY_CARE_PROVIDER_SITE_OTHER): Payer: BC Managed Care – PPO

## 2013-11-21 ENCOUNTER — Encounter (INDEPENDENT_AMBULATORY_CARE_PROVIDER_SITE_OTHER): Payer: Self-pay

## 2013-11-21 DIAGNOSIS — R5383 Other fatigue: Secondary | ICD-10-CM

## 2013-11-21 DIAGNOSIS — R5381 Other malaise: Secondary | ICD-10-CM

## 2013-11-21 DIAGNOSIS — R739 Hyperglycemia, unspecified: Secondary | ICD-10-CM

## 2013-11-21 DIAGNOSIS — E7800 Pure hypercholesterolemia, unspecified: Secondary | ICD-10-CM

## 2013-11-21 DIAGNOSIS — R7309 Other abnormal glucose: Secondary | ICD-10-CM

## 2013-11-21 LAB — LIPID PANEL
Cholesterol: 272 mg/dL — ABNORMAL HIGH (ref 0–200)
HDL: 36.6 mg/dL — ABNORMAL LOW (ref >39.00)
Total CHOL/HDL Ratio: 7
Triglycerides: 469 mg/dL — ABNORMAL HIGH (ref 0.0–149.0)
VLDL: 93.8 mg/dL — ABNORMAL HIGH (ref 0.0–40.0)

## 2013-11-21 LAB — COMPREHENSIVE METABOLIC PANEL
ALT: 37 U/L (ref 0–53)
AST: 28 U/L (ref 0–37)
Albumin: 4.8 g/dL (ref 3.5–5.2)
Alkaline Phosphatase: 41 U/L (ref 39–117)
BUN: 9 mg/dL (ref 6–23)
CO2: 27 mEq/L (ref 19–32)
Calcium: 9.7 mg/dL (ref 8.4–10.5)
Chloride: 102 mEq/L (ref 96–112)
Creatinine, Ser: 1.1 mg/dL (ref 0.4–1.5)
GFR: 81.35 mL/min (ref >60.00)
Glucose, Bld: 112 mg/dL — ABNORMAL HIGH (ref 70–99)
Potassium: 4.2 mEq/L (ref 3.5–5.1)
Sodium: 138 mEq/L (ref 135–145)
Total Bilirubin: 1.2 mg/dL (ref 0.3–1.2)
Total Protein: 7.7 g/dL (ref 6.0–8.3)

## 2013-11-21 LAB — CBC WITH DIFFERENTIAL/PLATELET
Basophils Absolute: 0 10*3/uL (ref 0.0–0.1)
Basophils Relative: 0.3 % (ref 0.0–3.0)
Eosinophils Absolute: 0.1 10*3/uL (ref 0.0–0.7)
Eosinophils Relative: 1.1 % (ref 0.0–5.0)
HCT: 43.2 % (ref 39.0–52.0)
Hemoglobin: 14.7 g/dL (ref 13.0–17.0)
Lymphocytes Relative: 34.8 % (ref 12.0–46.0)
Lymphs Abs: 3 10*3/uL (ref 0.7–4.0)
MCHC: 34.1 g/dL (ref 30.0–36.0)
MCV: 86.1 fl (ref 78.0–100.0)
Monocytes Absolute: 0.7 10*3/uL (ref 0.1–1.0)
Monocytes Relative: 8 % (ref 3.0–12.0)
Neutro Abs: 4.8 10*3/uL (ref 1.4–7.7)
Neutrophils Relative %: 55.8 % (ref 43.0–77.0)
Platelets: 224 10*3/uL (ref 150.0–400.0)
RBC: 5.01 Mil/uL (ref 4.22–5.81)
RDW: 13.3 % (ref 11.5–14.6)
WBC: 8.6 10*3/uL (ref 4.5–10.5)

## 2013-11-21 LAB — HEMOGLOBIN A1C: Hgb A1c MFr Bld: 5.9 % (ref 4.6–6.5)

## 2013-11-21 LAB — LDL CHOLESTEROL, DIRECT: Direct LDL: 157.7 mg/dL

## 2013-11-21 LAB — TSH: TSH: 1.83 u[IU]/mL (ref 0.35–5.50)

## 2013-11-21 NOTE — Progress Notes (Signed)
Order placed for a1c

## 2013-11-24 ENCOUNTER — Other Ambulatory Visit: Payer: Self-pay | Admitting: *Deleted

## 2013-11-24 MED ORDER — PRAVASTATIN SODIUM 10 MG PO TABS: 10.0000 mg | ORAL_TABLET | Freq: Every day | ORAL | Status: DC

## 2013-12-12 ENCOUNTER — Ambulatory Visit (INDEPENDENT_AMBULATORY_CARE_PROVIDER_SITE_OTHER): Payer: BC Managed Care – PPO | Admitting: Internal Medicine

## 2013-12-12 ENCOUNTER — Encounter: Payer: Self-pay | Admitting: Internal Medicine

## 2013-12-12 ENCOUNTER — Telehealth: Payer: Self-pay | Admitting: Internal Medicine

## 2013-12-12 VITALS — BP 120/82 | HR 77 | Temp 98.3°F | Ht 68.0 in | Wt 196.5 lb

## 2013-12-12 DIAGNOSIS — Z733 Stress, not elsewhere classified: Secondary | ICD-10-CM

## 2013-12-12 DIAGNOSIS — F439 Reaction to severe stress, unspecified: Secondary | ICD-10-CM

## 2013-12-12 DIAGNOSIS — I10 Essential (primary) hypertension: Secondary | ICD-10-CM

## 2013-12-12 DIAGNOSIS — M25569 Pain in unspecified knee: Secondary | ICD-10-CM

## 2013-12-12 DIAGNOSIS — G8929 Other chronic pain: Secondary | ICD-10-CM

## 2013-12-12 DIAGNOSIS — M549 Dorsalgia, unspecified: Secondary | ICD-10-CM

## 2013-12-12 DIAGNOSIS — M25579 Pain in unspecified ankle and joints of unspecified foot: Secondary | ICD-10-CM

## 2013-12-12 DIAGNOSIS — E7800 Pure hypercholesterolemia, unspecified: Secondary | ICD-10-CM

## 2013-12-12 MED ORDER — CEPHALEXIN 500 MG PO CAPS: 500.0000 mg | ORAL_CAPSULE | Freq: Three times a day (TID) | ORAL | Status: DC

## 2013-12-12 NOTE — Telephone Encounter (Signed)
See below

## 2013-12-12 NOTE — Progress Notes (Signed)
Pre-visit discussion using our clinic review tool. No additional management support is needed unless otherwise documented below in the visit note.  

## 2013-12-12 NOTE — Telephone Encounter (Signed)
Can schedule on 01/01/14 at 11:30.

## 2013-12-12 NOTE — Telephone Encounter (Signed)
Pt aware of appointment on 01/01/14

## 2013-12-12 NOTE — Telephone Encounter (Signed)
[facility] says 1 mth for CPE.  No 30 minute slots until May.  Pt did not schedule.  Please advise.

## 2013-12-13 ENCOUNTER — Telehealth: Payer: Self-pay | Admitting: Internal Medicine

## 2013-12-13 NOTE — Telephone Encounter (Signed)
I am ok to order the xray.  Where does he want the xray.  I can order for [facility] - will not be able to get until tomorrow.  I can write order for the hospital.

## 2013-12-13 NOTE — Telephone Encounter (Signed)
See attached note.

## 2013-12-13 NOTE — Telephone Encounter (Signed)
Patient Information:  Caller Name: [name]  Phone: [phone]  Patient: Cameron Sanchez, Cameron Sanchez  Gender: Male  DOB: 1971/05/02  Age: 43 Years  PCP: Einar Pheasant  Office Follow Up:  Does the office need to follow up with this patient?: Yes  Instructions For The Office: Requesting order for xray  RN Note:  Patient/[name] calling regarding swollen Left ankle.  He was seen in office on 12/12/13 for follow up blood work and states "Dr. Nicki Reaper looked at ankle at that time and offered order for xray."  Ankle is worse today and interferes with daily activity while at work.  Desires order for xray.  Symptoms  Reason For Call & Symptoms: seen in office 12/12/13 by Dr. Nicki Reaper.  Left ankle is worse unable to bear weight.  Reviewed Health History In EMR: Yes  Reviewed Medications In EMR: Yes  Reviewed Allergies In EMR: Yes  Reviewed Surgeries / Procedures: Yes  Date of Onset of Symptoms: 12/12/2013  Guideline(s) Used:  Ankle Pain  Disposition Per Guideline:   See Within 3 Days in Office  Reason For Disposition Reached:   Moderate pain (e.g., interferes with normal activities, limping) and present > 3 days  Advice Given:  N/A  RN Overrode Recommendation:  Follow Up With Office Later  Patient requesting xray of left ankle

## 2013-12-13 NOTE — Telephone Encounter (Signed)
Please advise 

## 2013-12-13 NOTE — Telephone Encounter (Signed)
Pt states he is unable to walk on it, had to leave work due to the pain. Denies any injury and questions why he should have an xray if he has not had any known injury. Pt states ankle quite painful. Advised pt he could be seen in an urgent care this evening for evaluation with increased pain and difficulty walking. Pt states he will be seen tonight and will call office back in the morning to followup.

## 2013-12-13 NOTE — Telephone Encounter (Signed)
The patient calling back wanting a response to his message from this morning.

## 2013-12-16 ENCOUNTER — Encounter: Payer: Self-pay | Admitting: Internal Medicine

## 2013-12-16 DIAGNOSIS — M25579 Pain in unspecified ankle and joints of unspecified foot: Secondary | ICD-10-CM | POA: Insufficient documentation

## 2013-12-16 DIAGNOSIS — Z658 Other specified problems related to psychosocial circumstances: Secondary | ICD-10-CM | POA: Insufficient documentation

## 2013-12-16 DIAGNOSIS — M25569 Pain in unspecified knee: Secondary | ICD-10-CM | POA: Insufficient documentation

## 2013-12-16 DIAGNOSIS — F439 Reaction to severe stress, unspecified: Secondary | ICD-10-CM | POA: Insufficient documentation

## 2013-12-16 NOTE — Assessment & Plan Note (Signed)
Apparently injured his left knee at work.  Workmans Comp.  Going to physical therapy.  Follow.

## 2013-12-16 NOTE — Assessment & Plan Note (Signed)
Now seeing Dr Mauri Pole.  Planning for "pain block".  Dr Mauri Pole prescribes his oxycodone and hydrocodone.  Follow.

## 2013-12-16 NOTE — Assessment & Plan Note (Signed)
Has not been watching what he eats.  Low cholesterol diet and exercise.  Follow lipid panel.  Triglycerides increased recently - 469.  Discussed importance of diet and exercise.  Follow.

## 2013-12-16 NOTE — Assessment & Plan Note (Signed)
Blood pressure as outlined.   Same medication regimen.  Check metabolic panel.  Follow pressures.  Get him back in soon to reassess.  If persistent elevation, will require further medication.

## 2013-12-16 NOTE — Assessment & Plan Note (Signed)
Pain started within the last two days.  No injury.  Increased ankle pain.  Tender to palpation.  Discussed further w/up including xray.  He declines.  Will wear ankle support.  Will notify me if symptoms change, worsen or do not resolve.

## 2013-12-16 NOTE — Progress Notes (Signed)
Subjective:    [name], male    DOB: August 11, 1971, 43 y.o.   MRN: [id number]  HPI 43 year old male with past history of hypertension, hypercholesterolemia, sleep apnea and chronic back pain who comes in today for a scheduled follow up.  I have not seen him in over one year.  Has been noncompliant with keeping appts.  has chronic back pain.   Now is seeing Dr Mauri Pole.  He hurt his knee at work.  He is currently under workman's comp for this.  Dr Mauri Pole is giving him hydrocodone and oxycodone.  He is taking oxycodone tid and hydrocodone qid.  Scheduled for a "pain block" 2/15.  Has not been watching what he eats.  Not exercising.  No chest pain reported.  Breathing stable.  Increased stress.     Past Medical History  Diagnosis Date  . Migraines   . Allergy   . Hypertension   . Hypercholesterolemia   . Chronic back pain     Current Outpatient Prescriptions on File Prior to Visit  Medication Sig Dispense Refill  . carisoprodol (SOMA) 350 MG tablet Take 350 mg by mouth 4 (four) times daily as needed for muscle spasms.       Marland Kitchen HYDROcodone-acetaminophen (NORCO/VICODIN) 5-325 MG per tablet Take 1 tablet by mouth every 6 (six) hours as needed for pain.      . metoprolol succinate (TOPROL-XL) 50 MG 24 hr tablet Take 1 tablet (50 mg total) by mouth daily. MUST KEEP APPT ON 12/12/13 FOR FURTHER REFILLS  30 tablet  0  . Oxycodone HCl 10 MG TABS Take 1 tablet (10 mg total) by mouth 3 (three) times daily.  6 tablet  0  . pravastatin (PRAVACHOL) 10 MG tablet Take 1 tablet (10 mg total) by mouth daily.  30 tablet  5  . triamterene-hydrochlorothiazide (MAXZIDE-25) 37.5-25 MG per tablet Take 1 tablet by mouth daily. MUST KEEP APPT ON 12/12/13 FOR FURTHER REFILLS  30 tablet  0   No current facility-administered medications on file prior to visit.    Review of Systems Patient denies any headache, lightheadedness or dizziness.  No sinus or allergy symptoms currently. No chest pain.  No sob.  No  nausea or vomiting.  No abdominal pain or cramping.  No bowel change, such as diarrhea, constipation, BRBPR or melana.  No urine change.  No acid reflux.  Increased back pain and knee pain as outlined.  States he hurt his left knee pain at work.  Workman's comp.  Going to [facility] physical therapy.  Being followed at [facility].  Increased stress.  Feels she is handling things relatively well.  No suicidal ideations.  Increased pain - left ankle.  No injury.       Objective:   Physical Exam  Filed Vitals:   12/12/13 1031  BP: 120/82  Pulse: 77  Temp: 98.3 F (36.8 C)   Blood pressure recheck:  48/62-19  43 year old male in no acute distress.   HEENT:  Nares - clear.  OP- without lesions or erythema.  NECK:  Supple, nontender.  No audible bruit.   HEART:  Appears to be regular. LUNGS:  Without crackles or wheezing audible.  Respirations even and unlabored.   RADIAL PULSE:  Equal bilaterally.  ABDOMEN:  Soft, nontender.  No audible abdominal bruit.   EXTREMITIES:  No increased edema to be present.    MSK:  Increased pain to palpation over the ankle.  No increased soft tissue  swelling.  No significant pain with rotation fo the ankle.                 Assessment & Plan:  CARDIOVASCULAR.  No cardiac symptoms reported now.  Continue risk factor modification.    HEALTH MAINTENANCE.  Overdue a physical.  Schedule.

## 2013-12-16 NOTE — Assessment & Plan Note (Signed)
Feels he is coping well.  Does not feel he needs anything more.  Follow.

## 2013-12-17 NOTE — Telephone Encounter (Signed)
Spoke with pt, advised on need to be re-evaluated. Advised pt to be seen in an Urgent Care of pain that sever, pt chose to take appt with Raquel on 12/19/13.

## 2013-12-17 NOTE — Telephone Encounter (Signed)
Can refer to ortho or podiatry if persistent pain.  With pain at that level - needs reevaluation.

## 2013-12-17 NOTE — Telephone Encounter (Signed)
The patient [facility] go to [facility] 1[facility]15. The physician gave him a shot the patient is not sure as to what type of shot ,but it helped a little. The shot only helped him Thursday . The pain started again on Friday and has continued to increase. On a scale of 1-10 he stated he is a 8.

## 2013-12-19 ENCOUNTER — Ambulatory Visit (INDEPENDENT_AMBULATORY_CARE_PROVIDER_SITE_OTHER)
Admission: RE | Admit: 2013-12-19 | Discharge: 2013-12-19 | Disposition: A | Discharge status: Home / Self Care | Payer: BC Managed Care – PPO | Source: Ambulatory Visit | Attending: Adult Health | Admitting: Adult Health

## 2013-12-19 ENCOUNTER — Encounter: Payer: Self-pay | Admitting: Adult Health

## 2013-12-19 ENCOUNTER — Ambulatory Visit (INDEPENDENT_AMBULATORY_CARE_PROVIDER_SITE_OTHER): Payer: BC Managed Care – PPO | Admitting: Adult Health

## 2013-12-19 VITALS — BP 130/90 | HR 93 | Temp 97.8°F | Resp 12 | Wt 198.0 lb

## 2013-12-19 DIAGNOSIS — M25579 Pain in unspecified ankle and joints of unspecified foot: Secondary | ICD-10-CM

## 2013-12-19 LAB — SEDIMENTATION RATE: Sed Rate: 4 mm/hr (ref 0–22)

## 2013-12-19 MED ORDER — NAPROXEN 500 MG PO TABS: 500.0000 mg | ORAL_TABLET | Freq: Two times a day (BID) | ORAL | Status: DC

## 2013-12-19 MED ORDER — HYDROCODONE-ACETAMINOPHEN 5-325 MG PO TABS: 1.0000 | ORAL_TABLET | Freq: Four times a day (QID) | ORAL | Status: DC | PRN

## 2013-12-19 NOTE — Progress Notes (Signed)
Pre visit review using our clinic review tool, if applicable. No additional management support is needed unless otherwise documented below in the visit note. 

## 2013-12-19 NOTE — Progress Notes (Signed)
[name], male   DOB: [birth year], 43 y.o.   MRN: [id number]   Subjective:    [name], male    DOB: 11-Sep-1971, 43 y.o.   MRN: [id number]  HPI  Patient is a pleasant 43 year old male who presents to clinic with complaints of left ankle pain. He was seen by his PCP on 12/12/2013 for the same symptom. He declined having an x-ray at that time. He was started on Keflex. Since then he has been to urgent care where he was given a prednisone taper. He does not like the way the prednisone taper is making him feel. He reports that he is completely out of his pain medications because he has been taking more than usual secondary to his ankle pain. Note, he has been receiving hydrocodone and oxycodone from Dr. Mauri Pole, Orthopedics. He drives to [place] for his refills. He is requesting medication for pain.   Past Medical History  Diagnosis Date  . Migraines   . Allergy   . Hypertension   . Hypercholesterolemia   . Chronic back pain     Current Outpatient Prescriptions on File Prior to Visit  Medication Sig Dispense Refill  . carisoprodol (SOMA) 350 MG tablet Take 350 mg by mouth 4 (four) times daily as needed for muscle spasms.       . cephALEXin (KEFLEX) 500 MG capsule Take 1 capsule (500 mg total) by mouth 3 (three) times daily.  21 capsule  0  . metoprolol succinate (TOPROL-XL) 50 MG 24 hr tablet Take 1 tablet (50 mg total) by mouth daily. MUST KEEP APPT ON 12/12/13 FOR FURTHER REFILLS  30 tablet  0  . pravastatin (PRAVACHOL) 10 MG tablet Take 1 tablet (10 mg total) by mouth daily.  30 tablet  5  . triamterene-hydrochlorothiazide (MAXZIDE-25) 37.5-25 MG per tablet Take 1 tablet by mouth daily. MUST KEEP APPT ON 12/12/13 FOR FURTHER REFILLS  30 tablet  0   No current facility-administered medications on file prior to visit.     Review of Systems  Constitutional: Negative for fever and chills.  Respiratory: Negative.   Cardiovascular: Negative.   Musculoskeletal:     Pain in left ankle now also into his toes. Reporting pain on the right toes as well. No injury. Reports he woke up with the pain.  Psychiatric/Behavioral: Negative.   All other systems reviewed and are negative.       Objective:  BP 130/90  Pulse 93  Temp(Src) 97.8 F (36.6 C) (Oral)  Resp 12  Wt 198 lb (89.812 kg)  SpO2 97%   Physical Exam  Constitutional: He is oriented to person, place, and time. No distress.  Cardiovascular: Normal rate and regular rhythm.   Pulmonary/Chest: Effort normal. No respiratory distress.  Musculoskeletal:  Left ankle with slight erythema on the lateral aspect. Some of his toes and also slightly erythematous. Only trace edema noted.   Neurological: He is alert and oriented to person, place, and time.  Skin: There is erythema.  Psychiatric: He has a normal mood and affect. His behavior is normal. Judgment and thought content normal.       Assessment & Plan:   1. Pain in joint, ankle and foot He has agreed to have an xray. Will get labs as well to check for gout, inflammation. No pain medication prescribed today on account that he recently had this filled. Has run out of narcotics because he was taking more than prescribed.   - Uric acid -  Sedimentation rate - C-reactive protein - DG Ankle Complete Left; Future - naproxen (NAPROSYN) 500 MG tablet; Take 1 tablet (500 mg total) by mouth 2 (two) times daily with a meal.  Dispense: 30 tablet; Refill: 0    Return if symptoms worsen or fail to improve.

## 2013-12-20 LAB — C-REACTIVE PROTEIN: CRP: 0.6 mg/dL (ref 0.5–20.0)

## 2013-12-20 LAB — URIC ACID: Uric Acid, Serum: 6.6 mg/dL (ref 4.0–7.8)

## 2013-12-21 ENCOUNTER — Other Ambulatory Visit: Payer: Self-pay | Admitting: Internal Medicine

## 2014-01-01 ENCOUNTER — Ambulatory Visit: Payer: Self-pay | Admitting: Internal Medicine

## 2014-01-08 ENCOUNTER — Telehealth: Payer: Self-pay | Admitting: Internal Medicine

## 2014-01-08 NOTE — Telephone Encounter (Signed)
Called pt.  States his blood pressure has been averaging 170/100 the last few checks - over the last week.  States feels better today.  No light headedness or dizziness.  Only minimal headache now.  Pulse has been running in the 80 range.  Will get his blood pressure checked today.  If higher or if symptoms change or worsen, he will be seen today.  Can take an extra 1/2 of metoprolol today.  See if he can be worked in our office 01/09/14.

## 2014-01-08 NOTE — Telephone Encounter (Signed)
Please advise (we are now closed & leaving the office now)

## 2014-01-08 NOTE — Telephone Encounter (Signed)
Patient Information:  Caller Name: Mont  Phone: [phone]  Patient: Timo, Hartwig  Gender: Male  DOB: 25-Jun-1971  Age: 43 Years  PCP: Einar Pheasant  Office Follow Up:  Does the office need to follow up with this patient?: Yes  Instructions For The Office: Please contact patient.  RN Note:  Please contact patient to give further instructions regarding medication dosages vs going to ED or UC for evaluation since office is closing early due to weather.  Symptoms  Reason For Call & Symptoms: Elevated blood pressure. Reading on 01/05/14 was 174/108. He does not have equipment at home to take BP again. Reports headache, but no other symptoms. Has been taking medicine as prescribed.  Reviewed Health History In EMR: Yes  Reviewed Medications In EMR: Yes  Reviewed Allergies In EMR: Yes  Reviewed Surgeries / Procedures: Yes  Date of Onset of Symptoms: 01/03/2014  Treatments Tried: HCTZ, Toprol  Treatments Tried Worked: No  Guideline(s) Used:  High Blood Pressure  Disposition Per Guideline:   See Within 2 Weeks in Office  Reason For Disposition Reached:   BP > 140/90 and is taking BP medications  Advice Given:  N/A  Patient Will Follow Care Advice:  YES

## 2014-01-09 ENCOUNTER — Encounter: Payer: Self-pay | Admitting: Adult Health

## 2014-01-09 ENCOUNTER — Ambulatory Visit (INDEPENDENT_AMBULATORY_CARE_PROVIDER_SITE_OTHER): Payer: BC Managed Care – PPO | Admitting: Adult Health

## 2014-01-09 ENCOUNTER — Encounter (INDEPENDENT_AMBULATORY_CARE_PROVIDER_SITE_OTHER): Payer: Self-pay

## 2014-01-09 VITALS — BP 133/86 | HR 79 | Temp 98.8°F | Resp 14 | Wt 197.0 lb

## 2014-01-09 DIAGNOSIS — I10 Essential (primary) hypertension: Secondary | ICD-10-CM

## 2014-01-09 MED ORDER — METOPROLOL SUCCINATE ER 50 MG PO TB24: ORAL_TABLET | ORAL | Status: DC

## 2014-01-09 NOTE — Progress Notes (Signed)
Pre visit review using our clinic review tool, if applicable. No additional management support is needed unless otherwise documented below in the visit note. 

## 2014-01-09 NOTE — Patient Instructions (Signed)
  Take 1 and 1/2 tablet of metoprolol for your blood pressure.  Monitor your blood pressure. If better controlled then let us know and we will send in a new prescription.  If your blood pressure is still elevated then take 2 tablets of the metoprolol.   If still not controlled then return for follow up in 1 week.  Avoid sodium. No table salt.

## 2014-01-09 NOTE — Assessment & Plan Note (Signed)
Increase metoprolol to 1 & 1/2 tablet daily. If still not controlled then take 2 tablets - total of 100 mg. He will monitor b/p. If still not improved he will call to be seen.

## 2014-01-09 NOTE — Progress Notes (Signed)
   Subjective:    [name], male    DOB: 1970-12-27, 43 y.o.   MRN: [id number]  HPI  Pt is a pleasant 43 y/o male who presents to clinic with concerns of elevated b/p. He spoke with Dr. Nicki Reaper yesterday evening and was advised to take metoprolol 1 and 1/2 tablet. His blood pressure today in clinic is 133/86. He reports earlier readings from last week were 174/106 on Thursday, 171/103 on Friday and 185/110 yesterday.   Current Outpatient Prescriptions on File Prior to Visit  Medication Sig Dispense Refill  . carisoprodol (SOMA) 350 MG tablet Take 350 mg by mouth 4 (four) times daily as needed for muscle spasms.       . cephALEXin (KEFLEX) 500 MG capsule Take 1 capsule (500 mg total) by mouth 3 (three) times daily.  21 capsule  0  . naproxen (NAPROSYN) 500 MG tablet Take 1 tablet (500 mg total) by mouth 2 (two) times daily with a meal.  30 tablet  0  . pravastatin (PRAVACHOL) 10 MG tablet Take 1 tablet (10 mg total) by mouth daily.  30 tablet  5  . triamterene-hydrochlorothiazide (MAXZIDE-25) 37.5-25 MG per tablet Take 1 tablet by mouth daily.  30 tablet  5   No current facility-administered medications on file prior to visit.    Review of Systems  Constitutional: Negative.   Respiratory: Negative.   Cardiovascular: Negative.   Neurological: Negative.   Psychiatric/Behavioral: Negative.   All other systems reviewed and are negative.       Objective:   Physical Exam  Constitutional: He is oriented to person, place, and time. He appears well-developed and well-nourished. No distress.  Cardiovascular: Normal rate, regular rhythm and normal heart sounds.   Pulmonary/Chest: Effort normal and breath sounds normal.  Neurological: He is alert and oriented to person, place, and time.  Skin: Skin is warm and dry.  Psychiatric: He has a normal mood and affect. His behavior is normal. Judgment and thought content normal.          Assessment & Plan:

## 2014-01-09 NOTE — Telephone Encounter (Signed)
Spoke with pt, appt today at 3:15 with Raquel, ok with Dr. Nicki Reaper

## 2014-01-11 ENCOUNTER — Telehealth: Payer: Self-pay | Admitting: Internal Medicine

## 2014-01-11 NOTE — Telephone Encounter (Signed)
Relevant patient education mailed to patient.  

## 2014-01-29 ENCOUNTER — Ambulatory Visit: Payer: Self-pay | Admitting: Internal Medicine

## 2014-01-31 ENCOUNTER — Encounter: Payer: Self-pay | Admitting: Internal Medicine

## 2014-01-31 ENCOUNTER — Ambulatory Visit (INDEPENDENT_AMBULATORY_CARE_PROVIDER_SITE_OTHER): Payer: BC Managed Care – PPO | Admitting: Internal Medicine

## 2014-01-31 VITALS — BP 122/80 | HR 73 | Temp 98.3°F | Wt 195.5 lb

## 2014-01-31 DIAGNOSIS — G8929 Other chronic pain: Secondary | ICD-10-CM

## 2014-01-31 DIAGNOSIS — R739 Hyperglycemia, unspecified: Secondary | ICD-10-CM

## 2014-01-31 DIAGNOSIS — F439 Reaction to severe stress, unspecified: Secondary | ICD-10-CM

## 2014-01-31 DIAGNOSIS — M549 Dorsalgia, unspecified: Secondary | ICD-10-CM

## 2014-01-31 DIAGNOSIS — J329 Chronic sinusitis, unspecified: Secondary | ICD-10-CM

## 2014-01-31 DIAGNOSIS — E7800 Pure hypercholesterolemia, unspecified: Secondary | ICD-10-CM

## 2014-01-31 DIAGNOSIS — Z733 Stress, not elsewhere classified: Secondary | ICD-10-CM

## 2014-01-31 DIAGNOSIS — R7309 Other abnormal glucose: Secondary | ICD-10-CM

## 2014-01-31 DIAGNOSIS — I10 Essential (primary) hypertension: Secondary | ICD-10-CM

## 2014-01-31 DIAGNOSIS — M25579 Pain in unspecified ankle and joints of unspecified foot: Secondary | ICD-10-CM

## 2014-01-31 MED ORDER — AMOXICILLIN 875 MG PO TABS: 875.0000 mg | ORAL_TABLET | Freq: Two times a day (BID) | ORAL | Status: DC

## 2014-02-03 ENCOUNTER — Encounter: Payer: Self-pay | Admitting: Internal Medicine

## 2014-02-03 DIAGNOSIS — J329 Chronic sinusitis, unspecified: Secondary | ICD-10-CM | POA: Insufficient documentation

## 2014-02-03 NOTE — Progress Notes (Signed)
  Subjective:    [name], male    DOB: 26-Jan-1971, 43 y.o.   MRN: [id number]  HPI 43 year old male with past history of hypertension, hypercholesterolemia, sleep apnea and chronic back pain who comes in today for a scheduled follow up.  He has been going to pain clinic.  Received two injections.  Did not help.  Now is seeing Dr Mauri Pole.   Saw Dr Mauri Pole yesterday.  Planning injection in 2-3 weeks.  We discussed the need to cut down the amount of narcotics he is taking.  Have tried to get him in detox.  He has declined.  He recently had problems with foot pain.  This has resolved.   He reports recently noticing his ears stopped up, increased congestion and cough.  Persistent.  Productive mucus.      Past Medical History  Diagnosis Date  . Migraines   . Allergy   . Hypertension   . Hypercholesterolemia   . Chronic back pain     Current Outpatient Prescriptions on File Prior to Visit  Medication Sig Dispense Refill  . carisoprodol (SOMA) 350 MG tablet Take 350 mg by mouth 4 (four) times daily as needed for muscle spasms.       . cephALEXin (KEFLEX) 500 MG capsule Take 1 capsule (500 mg total) by mouth 3 (three) times daily.  21 capsule  0  . metoprolol succinate (TOPROL-XL) 50 MG 24 hr tablet Take 2 tablets daily  60 tablet  6  . naproxen (NAPROSYN) 500 MG tablet Take 1 tablet (500 mg total) by mouth 2 (two) times daily with a meal.  30 tablet  0  . pravastatin (PRAVACHOL) 10 MG tablet Take 1 tablet (10 mg total) by mouth daily.  30 tablet  5  . triamterene-hydrochlorothiazide (MAXZIDE-25) 37.5-25 MG per tablet Take 1 tablet by mouth daily.  30 tablet  5   No current facility-administered medications on file prior to visit.    Review of Systems Patient denies any headache, lightheadedness or dizziness. Does report the increased congestion, cough and drainage as outlined.  No chest pain.   No sob.  Some cough and congestion.  No nausea or vomiting.  No abdominal pain or  cramping.  No bowel change, such as diarrhea, constipation, BRBPR or melana.  No urine change.  Blood pressure doing better.        Objective:   Physical Exam  Filed Vitals:   01/31/14 1431  BP: 122/80  Pulse: 73  Temp: 98.3 F (36.8 C)   Blood pressure recheck:  75/9  43 year old male in no acute distress.   HEENT:  Nares - slightly erythematous turbinates.   OP- without lesions or erythema.   Minimal tenderness to palpation over the sinuses.  NECK:  Supple, nontender.  No audible bruit.   HEART:  Appears to be regular. LUNGS:  Without crackles or wheezing audible.  Respirations even and unlabored.   RADIAL PULSE:  Equal bilaterally.  ABDOMEN:  Soft, nontender.  No audible abdominal bruit.   EXTREMITIES:  No increased edema to be present.                  Assessment & Plan:  CARDIOVASCULAR.  No chest pain.  Continue risk factor modification.      HEALTH MAINTENANCE.  Schedule for a physical.  Check PSA.

## 2014-02-03 NOTE — Assessment & Plan Note (Signed)
Now seeing Dr Mauri Pole.  Planning for another injection.  Dr Mauri Pole prescribes his oxycodone and hydrocodone.  Follow.

## 2014-02-03 NOTE — Assessment & Plan Note (Signed)
Feels he is coping well.  Does not feel he needs anything more.  Follow.

## 2014-02-03 NOTE — Assessment & Plan Note (Signed)
Has cut down on [facility].   Trying to modify his diet.  Low cholesterol diet and exercise.  Follow lipid panel.  Triglycerides increased recently - 469.  Discussed importance of diet and exercise.  Follow.  Recheck fasting lipid profile with next labs.

## 2014-02-03 NOTE — Assessment & Plan Note (Signed)
Treat with amoxicillin 875mg  bid as directed.  Saline nasal spray as directed.  Mucinex/robitussin as directed.  Follow.

## 2014-02-03 NOTE — Assessment & Plan Note (Signed)
On metoprolol 2 tablets - total of 100 mg per day - now.  Blood pressure better.  Follow.

## 2014-02-03 NOTE — Assessment & Plan Note (Signed)
Resolved

## 2014-03-28 ENCOUNTER — Telehealth: Payer: Self-pay | Admitting: *Deleted

## 2014-03-28 MED ORDER — PRAVASTATIN SODIUM 10 MG PO TABS: 10.0000 mg | ORAL_TABLET | Freq: Every day | ORAL | Status: DC

## 2014-03-28 NOTE — Telephone Encounter (Signed)
Sent in rx.

## 2014-04-04 ENCOUNTER — Other Ambulatory Visit: Payer: Self-pay

## 2014-04-11 ENCOUNTER — Ambulatory Visit: Payer: Self-pay | Admitting: Internal Medicine

## 2014-06-27 ENCOUNTER — Other Ambulatory Visit: Payer: Self-pay | Admitting: Internal Medicine

## 2014-08-12 HISTORY — PX: BACK SURGERY: SHX140

## 2014-08-16 ENCOUNTER — Other Ambulatory Visit: Payer: Self-pay | Admitting: *Deleted

## 2014-08-16 MED ORDER — METOPROLOL SUCCINATE ER 50 MG PO TB24: ORAL_TABLET | ORAL | Status: DC

## 2014-10-01 ENCOUNTER — Other Ambulatory Visit: Payer: Self-pay | Admitting: Internal Medicine

## 2014-10-25 ENCOUNTER — Other Ambulatory Visit: Payer: Self-pay | Admitting: Internal Medicine

## 2014-10-25 NOTE — Telephone Encounter (Signed)
Ok to refill x 1 only.  Will schedule labs.

## 2014-10-25 NOTE — Telephone Encounter (Signed)
Last visit and labs 12/19/13. Called pt and notified on need for appt and labs. States he had back surgery 9/15. Returns to work in after new year. Scheduled lab appt 10/31/14. Sch appt with Dr. Nicki Reaper 12/18/14. Did you want to add any add's labs to those already entered? Ok to refill unitl appt?

## 2014-10-29 MED ORDER — PRAVASTATIN SODIUM 10 MG PO TABS: 10.0000 mg | ORAL_TABLET | Freq: Every day | ORAL | Status: DC

## 2014-10-31 ENCOUNTER — Other Ambulatory Visit (INDEPENDENT_AMBULATORY_CARE_PROVIDER_SITE_OTHER): Payer: BC Managed Care – PPO

## 2014-10-31 DIAGNOSIS — E7800 Pure hypercholesterolemia, unspecified: Secondary | ICD-10-CM

## 2014-10-31 DIAGNOSIS — R739 Hyperglycemia, unspecified: Secondary | ICD-10-CM

## 2014-10-31 DIAGNOSIS — I10 Essential (primary) hypertension: Secondary | ICD-10-CM

## 2014-10-31 LAB — HEPATIC FUNCTION PANEL
ALT: 40 U/L (ref 0–53)
AST: 25 U/L (ref 0–37)
Albumin: 4.6 g/dL (ref 3.5–5.2)
Alkaline Phosphatase: 56 U/L (ref 39–117)
Bilirubin, Direct: 0.1 mg/dL (ref 0.0–0.3)
Total Bilirubin: 0.8 mg/dL (ref 0.2–1.2)
Total Protein: 7.8 g/dL (ref 6.0–8.3)

## 2014-10-31 LAB — BASIC METABOLIC PANEL
BUN: 10 mg/dL (ref 6–23)
CO2: 26 mEq/L (ref 19–32)
Calcium: 9.6 mg/dL (ref 8.4–10.5)
Chloride: 101 mEq/L (ref 96–112)
Creatinine, Ser: 1 mg/dL (ref 0.4–1.5)
GFR: 82.82 mL/min (ref >60.00)
Glucose, Bld: 115 mg/dL — ABNORMAL HIGH (ref 70–99)
Potassium: 4.1 mEq/L (ref 3.5–5.1)
Sodium: 136 mEq/L (ref 135–145)

## 2014-10-31 LAB — LIPID PANEL
Cholesterol: 252 mg/dL — ABNORMAL HIGH (ref 0–200)
HDL: 34.3 mg/dL — ABNORMAL LOW (ref >39.00)
NonHDL: 217.7
Total CHOL/HDL Ratio: 7
Triglycerides: 313 mg/dL — ABNORMAL HIGH (ref 0.0–149.0)
VLDL: 62.6 mg/dL — ABNORMAL HIGH (ref 0.0–40.0)

## 2014-10-31 LAB — LDL CHOLESTEROL, DIRECT: Direct LDL: 158.6 mg/dL

## 2014-10-31 LAB — HEMOGLOBIN A1C: Hgb A1c MFr Bld: 6.1 % (ref 4.6–6.5)

## 2014-11-01 ENCOUNTER — Other Ambulatory Visit: Payer: Self-pay | Admitting: *Deleted

## 2014-11-01 ENCOUNTER — Telehealth: Payer: Self-pay | Admitting: *Deleted

## 2014-11-01 MED ORDER — PRAVASTATIN SODIUM 10 MG PO TABS: 10.0000 mg | ORAL_TABLET | Freq: Every day | ORAL | Status: DC

## 2014-11-01 MED ORDER — TRIAMTERENE-HCTZ 37.5-25 MG PO TABS: 1.0000 | ORAL_TABLET | Freq: Every day | ORAL | Status: DC

## 2014-11-01 NOTE — Telephone Encounter (Signed)
Called to advised pt about labs. States [facility] did not have his pravastatin that was sent 10/29/14, even though confirmed receipt on our end. They did receive his Triam/HCTZ. Resent pravastatin to pharmacy.

## 2014-11-01 NOTE — Telephone Encounter (Signed)
[facility] called, stating pt's insurance will only pay if Rxs sent as 90 days. Recent refills were sent for 30 days since pt needing to be seen. Appt scheduled 12/18/14. Ok to send #90?

## 2014-11-01 NOTE — Telephone Encounter (Signed)
Ok to change to 90 day since had his labs.  thanks

## 2014-11-01 NOTE — Telephone Encounter (Signed)
Rx sent to pharmacy by escript  

## 2014-11-18 ENCOUNTER — Other Ambulatory Visit: Payer: Self-pay | Admitting: Internal Medicine

## 2014-12-18 ENCOUNTER — Ambulatory Visit (INDEPENDENT_AMBULATORY_CARE_PROVIDER_SITE_OTHER): Payer: BLUE CROSS/BLUE SHIELD | Admitting: Internal Medicine

## 2014-12-18 ENCOUNTER — Encounter: Payer: Self-pay | Admitting: Internal Medicine

## 2014-12-18 VITALS — BP 110/80 | HR 76 | Temp 98.2°F | Ht 68.0 in | Wt 202.2 lb

## 2014-12-18 DIAGNOSIS — R739 Hyperglycemia, unspecified: Secondary | ICD-10-CM

## 2014-12-18 DIAGNOSIS — G8929 Other chronic pain: Secondary | ICD-10-CM

## 2014-12-18 DIAGNOSIS — E7800 Pure hypercholesterolemia, unspecified: Secondary | ICD-10-CM

## 2014-12-18 DIAGNOSIS — M549 Dorsalgia, unspecified: Secondary | ICD-10-CM

## 2014-12-18 DIAGNOSIS — Z658 Other specified problems related to psychosocial circumstances: Secondary | ICD-10-CM

## 2014-12-18 DIAGNOSIS — E669 Obesity, unspecified: Secondary | ICD-10-CM

## 2014-12-18 DIAGNOSIS — F439 Reaction to severe stress, unspecified: Secondary | ICD-10-CM

## 2014-12-18 DIAGNOSIS — I10 Essential (primary) hypertension: Secondary | ICD-10-CM

## 2014-12-18 NOTE — Progress Notes (Signed)
Pre visit review using our clinic review tool, if applicable. No additional management support is needed unless otherwise documented below in the visit note. 

## 2014-12-18 NOTE — Progress Notes (Signed)
[name], male   DOB: [birth year], 44 y.o.   MRN: [id number]   Subjective:    [name], male    DOB: 03-29-1971, 44 y.o.   MRN: [id number]  HPI  Patient here for a follow up.  Overdue.  Missed last appt.  Had back surgery 08/12/14.  Dr Mauri Pole.  Back is 70% better.  Having increased pain - right leg.  Had to stop physical therapy.  Just had f/u MRI.  Unsure of results.  Due to f/u with Dr Mauri Pole 01/10/15.  Is planning to call for earlier appt.  Dr Mauri Pole is giving him his hydrocodone.  Some increased stress.  No depression.  Overall handling things relatively well.  Not watching his diet.  Not exercising.                             Past Medical History  Diagnosis Date  . Migraines   . Allergy   . Hypertension   . Hypercholesterolemia   . Chronic back pain     Current Outpatient Prescriptions on File Prior to Visit  Medication Sig Dispense Refill  . carisoprodol (SOMA) 350 MG tablet Take 350 mg by mouth 4 (four) times daily as needed for muscle spasms.     . metoprolol succinate (TOPROL-XL) 50 MG 24 hr tablet TAKE 2 TABLETS BY MOUTH EVERY DAY 60 tablet 0  . naproxen (NAPROSYN) 500 MG tablet Take 1 tablet (500 mg total) by mouth 2 (two) times daily with a meal. 30 tablet 0  . pravastatin (PRAVACHOL) 10 MG tablet Take 1 tablet (10 mg total) by mouth daily. 90 tablet 0  . triamterene-hydrochlorothiazide (MAXZIDE-25) 37.5-25 MG per tablet Take 1 tablet by mouth daily. 90 tablet 0   No current facility-administered medications on file prior to visit.    Review of Systems  Constitutional: Positive for activity change (not working.  increased right leg pain. ) and unexpected weight change (gaining weight.  not watching his diet.  not exercising.  ).  HENT: Negative for congestion and sinus pressure.   Respiratory: Negative for cough, chest tightness and shortness of breath.   Cardiovascular: Negative for chest pain, palpitations and leg swelling.    Gastrointestinal: Negative for nausea, abdominal pain, diarrhea and constipation.  Musculoskeletal: Positive for back pain (followed by Dr Mauri Pole.  is better s/u surgery.  also with right leg pain. ). Negative for joint swelling.  Skin: Negative for color change and rash.  Neurological: Negative for dizziness and light-headedness.       Objective:    Physical Exam  Constitutional: He appears well-developed and well-nourished. No distress.  HENT:  Nose: Nose normal.  Mouth/Throat: Oropharynx is clear and moist.  Neck: Neck supple. No thyromegaly present.  Cardiovascular: Normal rate and regular rhythm.   Pulmonary/Chest: Effort normal and breath sounds normal. No respiratory distress.  Abdominal: Soft. Bowel sounds are normal. There is no tenderness.  Musculoskeletal: He exhibits no edema.  Lymphadenopathy:    He has no cervical adenopathy.  Skin: No rash noted. No erythema.  Psychiatric: He has a normal mood and affect. His behavior is normal.    BP 110/80 mmHg  Pulse 76  Temp(Src) 98.2 F (36.8 C) (Oral)  Ht 5' 8"  (1.727 m)  Wt 202 lb 4 oz (91.74 kg)  BMI 30.76 kg/m2  SpO2 96% Wt Readings from Last 3 Encounters:  12/18/14 202 lb 4 oz (91.74 kg)  01/31/14 195 lb 8 oz (88.678 kg)  01/09/14 197 lb (89.359 kg)     Lab Results  Component Value Date   WBC 8.6 11/21/2013   HGB 14.7 11/21/2013   HCT 43.2 11/21/2013   PLT 224.0 11/21/2013   GLUCOSE 115* 10/31/2014   CHOL 252* 10/31/2014   TRIG 313.0* 10/31/2014   HDL 34.30* 10/31/2014   LDLDIRECT 158.6 10/31/2014   ALT 40 10/31/2014   AST 25 10/31/2014   NA 136 10/31/2014   K 4.1 10/31/2014   CL 101 10/31/2014   CREATININE 1.0 10/31/2014   BUN 10 10/31/2014   CO2 26 10/31/2014   TSH 1.83 11/21/2013   HGBA1C 6.1 10/31/2014       Assessment & Plan:   Problem List Items Addressed This Visit    Chronic back pain    Had back surgery 07/2014.  Seeing Dr Mauri Pole.  Back seems to be doing better.  Now with right leg  pain.  Had MRI.  Due to f/u with Dr Mauri Pole 01/10/15.  Waiting for results of MRI.  Dr Mauri Pole prescribes his hydrocodone.        Relevant Medications   Oxycodone HCl 10 MG TABS   HYDROcodone-acetaminophen (NORCO/VICODIN) 5-325 MG per tablet   Hypercholesterolemia    Low cholesterol diet and exercise.  Follow lipid panel and liver function.  On pravastatin.  Now taking regularly.        Relevant Orders   Lipid panel   Hepatic function panel   Hyperglycemia    Low carb diet and exercise.  Follow.  Check met b and a1c.        Relevant Orders   Hemoglobin A1c   Hypertension - Primary    Blood pressure doing well.  Same medication regimen.  Follow met b.        Relevant Orders   CBC with Differential/Platelet   Basic metabolic panel   Obesity (BMI 30-39.9)    Diet and exercise.        Stress    Increased stress.  He feels he is doing relatively well.  Follow.        Relevant Orders   TSH       Einar Pheasant, MD

## 2014-12-21 ENCOUNTER — Encounter: Payer: Self-pay | Admitting: Internal Medicine

## 2014-12-21 DIAGNOSIS — E1149 Type 2 diabetes mellitus with other diabetic neurological complication: Secondary | ICD-10-CM | POA: Insufficient documentation

## 2014-12-21 DIAGNOSIS — E669 Obesity, unspecified: Secondary | ICD-10-CM | POA: Insufficient documentation

## 2014-12-21 DIAGNOSIS — R739 Hyperglycemia, unspecified: Secondary | ICD-10-CM | POA: Insufficient documentation

## 2014-12-21 NOTE — Assessment & Plan Note (Signed)
Increased stress.  He feels he is doing relatively well.  Follow.

## 2014-12-21 NOTE — Assessment & Plan Note (Signed)
Low carb diet and exercise.  Follow.  Check met b and a1c.    

## 2014-12-21 NOTE — Assessment & Plan Note (Signed)
Had back surgery 07/2014.  Seeing Dr Mauri Pole.  Back seems to be doing better.  Now with right leg pain.  Had MRI.  Due to f/u with Dr Mauri Pole 01/10/15.  Waiting for results of MRI.  Dr Mauri Pole prescribes his hydrocodone.

## 2014-12-21 NOTE — Assessment & Plan Note (Signed)
Diet and exercise.   

## 2014-12-21 NOTE — Assessment & Plan Note (Signed)
Blood pressure doing well.  Same medication regimen.  Follow met b  

## 2014-12-21 NOTE — Assessment & Plan Note (Signed)
Low cholesterol diet and exercise.  Follow lipid panel and liver function.  On pravastatin.  Now taking regularly.

## 2014-12-30 ENCOUNTER — Ambulatory Visit (INDEPENDENT_AMBULATORY_CARE_PROVIDER_SITE_OTHER): Payer: BLUE CROSS/BLUE SHIELD | Admitting: Internal Medicine

## 2014-12-30 ENCOUNTER — Encounter: Payer: Self-pay | Admitting: Internal Medicine

## 2014-12-30 VITALS — BP 130/80 | HR 78 | Temp 98.5°F | Ht 68.0 in | Wt 205.1 lb

## 2014-12-30 DIAGNOSIS — E7800 Pure hypercholesterolemia, unspecified: Secondary | ICD-10-CM

## 2014-12-30 DIAGNOSIS — M79672 Pain in left foot: Secondary | ICD-10-CM

## 2014-12-30 DIAGNOSIS — F439 Reaction to severe stress, unspecified: Secondary | ICD-10-CM

## 2014-12-30 DIAGNOSIS — Z658 Other specified problems related to psychosocial circumstances: Secondary | ICD-10-CM

## 2014-12-30 DIAGNOSIS — R739 Hyperglycemia, unspecified: Secondary | ICD-10-CM

## 2014-12-30 DIAGNOSIS — I10 Essential (primary) hypertension: Secondary | ICD-10-CM

## 2014-12-30 LAB — CBC WITH DIFFERENTIAL/PLATELET
Basophils Absolute: 0 10*3/uL (ref 0.0–0.1)
Basophils Relative: 0.5 % (ref 0.0–3.0)
Eosinophils Absolute: 0.1 10*3/uL (ref 0.0–0.7)
Eosinophils Relative: 1.5 % (ref 0.0–5.0)
HCT: 43.2 % (ref 39.0–52.0)
Hemoglobin: 14.7 g/dL (ref 13.0–17.0)
Lymphocytes Relative: 36.3 % (ref 12.0–46.0)
Lymphs Abs: 3.1 10*3/uL (ref 0.7–4.0)
MCHC: 34.1 g/dL (ref 30.0–36.0)
MCV: 86.5 fl (ref 78.0–100.0)
Monocytes Absolute: 0.8 10*3/uL (ref 0.1–1.0)
Monocytes Relative: 9.7 % (ref 3.0–12.0)
Neutro Abs: 4.5 10*3/uL (ref 1.4–7.7)
Neutrophils Relative %: 52 % (ref 43.0–77.0)
Platelets: 243 10*3/uL (ref 150.0–400.0)
RBC: 4.99 Mil/uL (ref 4.22–5.81)
RDW: 13.5 % (ref 11.5–15.5)
WBC: 8.7 10*3/uL (ref 4.0–10.5)

## 2014-12-30 LAB — BASIC METABOLIC PANEL
BUN: 7 mg/dL (ref 6–23)
CO2: 31 mEq/L (ref 19–32)
Calcium: 9.9 mg/dL (ref 8.4–10.5)
Chloride: 99 mEq/L (ref 96–112)
Creatinine, Ser: 1.01 mg/dL (ref 0.40–1.50)
GFR: 84.66 mL/min (ref >60.00)
Glucose, Bld: 103 mg/dL — ABNORMAL HIGH (ref 70–99)
Potassium: 4.6 mEq/L (ref 3.5–5.1)
Sodium: 136 mEq/L (ref 135–145)

## 2014-12-30 LAB — LIPID PANEL
Cholesterol: 221 mg/dL — ABNORMAL HIGH (ref 0–200)
HDL: 36.7 mg/dL — ABNORMAL LOW (ref >39.00)
Total CHOL/HDL Ratio: 6
Triglycerides: 526 mg/dL — ABNORMAL HIGH (ref 0.0–149.0)

## 2014-12-30 LAB — URIC ACID: Uric Acid, Serum: 9.5 mg/dL — ABNORMAL HIGH (ref 4.0–7.8)

## 2014-12-30 LAB — HEPATIC FUNCTION PANEL
ALT: 52 U/L (ref 0–53)
AST: 39 U/L — ABNORMAL HIGH (ref 0–37)
Albumin: 4.6 g/dL (ref 3.5–5.2)
Alkaline Phosphatase: 55 U/L (ref 39–117)
Bilirubin, Direct: 0.1 mg/dL (ref 0.0–0.3)
Total Bilirubin: 0.9 mg/dL (ref 0.2–1.2)
Total Protein: 7.4 g/dL (ref 6.0–8.3)

## 2014-12-30 LAB — LDL CHOLESTEROL, DIRECT: Direct LDL: 103 mg/dL

## 2014-12-30 LAB — HEMOGLOBIN A1C: Hgb A1c MFr Bld: 6 % (ref 4.6–6.5)

## 2014-12-30 LAB — TSH: TSH: 2.91 u[IU]/mL (ref 0.35–4.50)

## 2014-12-30 MED ORDER — COLCHICINE 0.6 MG PO TABS: 0.6000 mg | ORAL_TABLET | Freq: Two times a day (BID) | ORAL | Status: DC

## 2014-12-30 NOTE — Progress Notes (Signed)
Pre visit review using our clinic review tool, if applicable. No additional management support is needed unless otherwise documented below in the visit note. 

## 2014-12-31 ENCOUNTER — Encounter: Payer: Self-pay | Admitting: Internal Medicine

## 2014-12-31 NOTE — Assessment & Plan Note (Signed)
See recent cholesterol panel.  Have discussed diet and exercise.

## 2014-12-31 NOTE — Assessment & Plan Note (Signed)
Increased pain and soft tissue swelling - base of left great toe.  Acute flare over the last two days.  Denies any injury or trauma.  Appears to be c/w gout.  Had a flare last year - similar.  Uric acid level then wnl.  Recheck uric acid level.  Treat with colchicine.  Unable to take prednisone.  Makes his heart race.  Careful use if antiinflammatories.  He wants to hold on xray.  Refer to rheumatology.  If gout, will need to change the diuretic.

## 2014-12-31 NOTE — Progress Notes (Signed)
Patient ID: NIKOLAJ GERAGHTY, male   DOB: 03/18/1971, 44 y.o.   MRN: [id number]   Subjective:    Patient ID: KENDYN ZAMAN, male    DOB: [birth year], 44 y.o.   MRN: [id number]  HPI  Patient here as a work in with concerns regarding left foot pain.  Started acutely two days ago.  Increased pain and soft tissue swelling.  Denies any injury or trauma.  Similar to episode last year.  On oxycodone tid.  States out of vicodin.  Pain meds prescribed by Dr Mauri Pole.  Unable to take steroids.  Intolerance.  Makes his heart race.    Past Medical History  Diagnosis Date  . Migraines   . Allergy   . Hypertension   . Hypercholesterolemia   . Chronic back pain     Current Outpatient Prescriptions on File Prior to Visit  Medication Sig Dispense Refill  . carisoprodol (SOMA) 350 MG tablet Take 350 mg by mouth 4 (four) times daily as needed for muscle spasms.     . fluticasone (FLONASE) 50 MCG/ACT nasal spray Place 2 sprays into both nostrils as needed.    Marland Kitchen HYDROcodone-acetaminophen (NORCO/VICODIN) 5-325 MG per tablet Take 1 tablet by mouth 4 (four) times daily as needed.    . metoprolol succinate (TOPROL-XL) 50 MG 24 hr tablet TAKE 2 TABLETS BY MOUTH EVERY DAY 60 tablet 0  . naproxen (NAPROSYN) 500 MG tablet Take 1 tablet (500 mg total) by mouth 2 (two) times daily with a meal. 30 tablet 0  . Oxycodone HCl 10 MG TABS Take 1 tablet by mouth 3 (three) times daily.  0  . pravastatin (PRAVACHOL) 10 MG tablet Take 1 tablet (10 mg total) by mouth daily. 90 tablet 0  . triamterene-hydrochlorothiazide (MAXZIDE-25) 37.5-25 MG per tablet Take 1 tablet by mouth daily. 90 tablet 0   No current facility-administered medications on file prior to visit.    Review of Systems  Constitutional: Negative for fever and chills.  Cardiovascular: Negative for leg swelling.  Musculoskeletal:       Increased left foot pain.  Pain localized at the base of the left toe.  Minimal increased soft tissue swelling.  Denies any injury  or trauma.    Skin: Negative for color change and rash.       Objective:     Blood pressure recheck:  122/84  Physical Exam  Constitutional: He appears well-developed and well-nourished. No distress.  Cardiovascular: Normal rate and regular rhythm.   Pulmonary/Chest: Effort normal and breath sounds normal. No respiratory distress.  Abdominal: Bowel sounds are normal.  Musculoskeletal: He exhibits no edema.  Increased pain - base of left great toe.  Minimal increased soft tissue swelling.  No increased erythema.  No rash.    Psychiatric: He has a normal mood and affect. His behavior is normal.    BP 130/80 mmHg  Pulse 78  Temp(Src) 98.5 F (36.9 C) (Oral)  Ht 5\' 8"  (1.727 m)  Wt 205 lb 2 oz (93.044 kg)  BMI 31.20 kg/m2  SpO2 97% Wt Readings from Last 3 Encounters:  12/30/14 205 lb 2 oz (93.044 kg)  12/18/14 202 lb 4 oz (91.74 kg)  01/31/14 195 lb 8 oz (88.678 kg)     Lab Results  Component Value Date   WBC 8.7 12/30/2014   HGB 14.7 12/30/2014   HCT 43.2 12/30/2014   PLT 243.0 12/30/2014   GLUCOSE 103* 12/30/2014   CHOL 221* 12/30/2014   TRIG 526.0* 12/30/2014  HDL 36.70* 12/30/2014   LDLDIRECT 103.0 12/30/2014   ALT 52 12/30/2014   AST 39* 12/30/2014   NA 136 12/30/2014   K 4.6 12/30/2014   CL 99 12/30/2014   CREATININE 1.01 12/30/2014   BUN 7 12/30/2014   CO2 31 12/30/2014   TSH 2.91 12/30/2014   HGBA1C 6.0 12/30/2014    Dg Ankle Complete Left  12/19/2013   CLINICAL DATA:  Pain  EXAM: LEFT ANKLE COMPLETE - 3+ VIEW  COMPARISON:  None.  FINDINGS: There is no evidence of fracture, dislocation, or joint effusion. There is no evidence of arthropathy or other focal bone abnormality. Soft tissues are unremarkable.  IMPRESSION: Negative.   Electronically Signed   By: Kathreen Devoid   On: 12/19/2013 13:13       Assessment & Plan:   Problem List Items Addressed This Visit    Foot pain, left - Primary    Increased pain and soft tissue swelling - base of left great  toe.  Acute flare over the last two days.  Denies any injury or trauma.  Appears to be c/w gout.  Had a flare last year - similar.  Uric acid level then wnl.  Recheck uric acid level.  Treat with colchicine.  Unable to take prednisone.  Makes his heart race.  Careful use if antiinflammatories.  He wants to hold on xray.  Refer to rheumatology.  If gout, will need to change the diuretic.        Relevant Orders   Uric acid (Completed)   Ambulatory referral to Rheumatology   Hypercholesterolemia    See recent cholesterol panel.  Have discussed diet and exercise.        Hyperglycemia    Low carb diet and exercise.        Hypertension    Blood pressure doing well.  Follow.  May need to change diuretic if gout.        Relevant Orders   Basic metabolic panel (Completed)   Stress       Einar Pheasant, MD

## 2014-12-31 NOTE — Assessment & Plan Note (Signed)
Blood pressure doing well.  Follow.  May need to change diuretic if gout.

## 2014-12-31 NOTE — Assessment & Plan Note (Signed)
Low carb diet and exercise.   °

## 2015-01-21 ENCOUNTER — Telehealth: Payer: Self-pay | Admitting: *Deleted

## 2015-01-21 MED ORDER — COLCHICINE 0.6 MG PO TABS: 0.6000 mg | ORAL_TABLET | Freq: Two times a day (BID) | ORAL | Status: DC

## 2015-01-21 NOTE — Telephone Encounter (Signed)
Pt called states he was seen on 2.15.16 diagnosed with Gout to his left foot.  Pt states it has reoccurred, with the change of diet.  Please advise

## 2015-01-21 NOTE — Telephone Encounter (Signed)
Spoke with pt, he states his appoint with [facility] is 3.21.16.  Rx sent to [facility]

## 2015-01-21 NOTE — Telephone Encounter (Signed)
I had given him colchicine at his last visit.  Can restart colchicine .6mg  bid.  If does not have - ok to refill x 1.  Does he have an appt with Dr Jefm Bryant (rheumatology)?  Was ordered.

## 2015-01-30 ENCOUNTER — Other Ambulatory Visit: Payer: Self-pay | Admitting: Internal Medicine

## 2015-02-11 ENCOUNTER — Telehealth: Payer: Self-pay | Admitting: *Deleted

## 2015-02-11 DIAGNOSIS — I10 Essential (primary) hypertension: Secondary | ICD-10-CM

## 2015-02-11 DIAGNOSIS — R739 Hyperglycemia, unspecified: Secondary | ICD-10-CM

## 2015-02-11 DIAGNOSIS — E7800 Pure hypercholesterolemia, unspecified: Secondary | ICD-10-CM

## 2015-02-11 DIAGNOSIS — Z125 Encounter for screening for malignant neoplasm of prostate: Secondary | ICD-10-CM

## 2015-02-11 NOTE — Telephone Encounter (Signed)
Order placed for labs.

## 2015-02-11 NOTE — Telephone Encounter (Signed)
Pt coming in tomorrow what labs and dx?  

## 2015-02-12 ENCOUNTER — Other Ambulatory Visit: Payer: Self-pay

## 2015-02-18 ENCOUNTER — Other Ambulatory Visit: Payer: Self-pay | Admitting: Internal Medicine

## 2015-02-20 ENCOUNTER — Other Ambulatory Visit (INDEPENDENT_AMBULATORY_CARE_PROVIDER_SITE_OTHER): Payer: BLUE CROSS/BLUE SHIELD

## 2015-02-20 DIAGNOSIS — Z125 Encounter for screening for malignant neoplasm of prostate: Secondary | ICD-10-CM

## 2015-02-20 DIAGNOSIS — R739 Hyperglycemia, unspecified: Secondary | ICD-10-CM | POA: Diagnosis not present

## 2015-02-20 DIAGNOSIS — I10 Essential (primary) hypertension: Secondary | ICD-10-CM

## 2015-02-20 DIAGNOSIS — E7800 Pure hypercholesterolemia, unspecified: Secondary | ICD-10-CM

## 2015-02-20 LAB — HEPATIC FUNCTION PANEL
ALT: 39 U/L (ref 0–53)
AST: 24 U/L (ref 0–37)
Albumin: 4.5 g/dL (ref 3.5–5.2)
Alkaline Phosphatase: 48 U/L (ref 39–117)
Bilirubin, Direct: 0.1 mg/dL (ref 0.0–0.3)
Total Bilirubin: 0.8 mg/dL (ref 0.2–1.2)
Total Protein: 7.3 g/dL (ref 6.0–8.3)

## 2015-02-20 LAB — BASIC METABOLIC PANEL
BUN: 11 mg/dL (ref 6–23)
CO2: 28 mEq/L (ref 19–32)
Calcium: 9.9 mg/dL (ref 8.4–10.5)
Chloride: 103 mEq/L (ref 96–112)
Creatinine, Ser: 1.18 mg/dL (ref 0.40–1.50)
GFR: 70.7 mL/min (ref >60.00)
Glucose, Bld: 105 mg/dL — ABNORMAL HIGH (ref 70–99)
Potassium: 4.6 mEq/L (ref 3.5–5.1)
Sodium: 138 mEq/L (ref 135–145)

## 2015-02-20 LAB — LDL CHOLESTEROL, DIRECT: Direct LDL: 154 mg/dL

## 2015-02-20 LAB — LIPID PANEL
Cholesterol: 239 mg/dL — ABNORMAL HIGH (ref 0–200)
HDL: 36.8 mg/dL — ABNORMAL LOW (ref >39.00)
NonHDL: 202.2
Total CHOL/HDL Ratio: 6
Triglycerides: 359 mg/dL — ABNORMAL HIGH (ref 0.0–149.0)
VLDL: 71.8 mg/dL — ABNORMAL HIGH (ref 0.0–40.0)

## 2015-02-20 LAB — PSA: PSA: 0.52 ng/mL (ref 0.10–4.00)

## 2015-02-20 LAB — HEMOGLOBIN A1C: Hgb A1c MFr Bld: 5.9 % (ref 4.6–6.5)

## 2015-03-09 NOTE — Op Note (Signed)
PATIENT NAME:  Cameron Sanchez, BELMARES MR#:  [id number] DATE OF BIRTH:  12/22/1970  DATE OF PROCEDURE:  01/19/2012  PREOPERATIVE DIAGNOSIS: Recurrent hematospermia.   POSTOPERATIVE DIAGNOSIS: Recurrent hematospermia.    PROCEDURES:  1. Cystoscopy.  2. Transrectal ultrasound of the prostate.   SURGEON: John Giovanni, MD   ASSISTANT: None.   ANESTHESIA: General.   INDICATIONS: The patient is a 44 year old male with a history of recurrent hematospermia. Digital rectal exam and PSA were normal. He had had some increased bleeding with a significant amount of blood on one occasion, although this has resolved. He was scheduled for office cystoscopy; however, he decided he wanted to have it performed under anesthesia. Transrectal ultrasound will also be performed.   DESCRIPTION OF PROCEDURE: The patient was taken to the Operating Room where a general anesthetic was administered. He was placed in the low lithotomy position, and his external genitalia were prepped and draped sterilely. A timeout was performed. A 17-French  cystoscope was tight at the meatus and was removed. The distal meatus was dilated with Leander Rams sounds from 16 to 20-French. A 17-French cystoscope was then easily passed. The urethra was normal in caliber without stricture or lesions. The prostate shows mild lateral lobe enlargement with inflammatory changes present. The bladder mucosa was closely inspected, and no erythema, solid or papillary lesions were visualized. The ureteral orifices were normal-appearing with clear efflux. The bladder was emptied and the cystoscope was removed. A 7.5 megahertz transrectal ultrasound probe was inserted per rectum. Seminal vesicles were normal in appearance and showed no masses or abnormal-appearing cysts. Prostate volume was calculated at 32 mL. There were moderate prostatic calculi present. No echogenic abnormalities were identified.  The patient was taken to the postanesthesia care unit in stable  condition. There were no complications. Estimated blood loss was zero.   ____________________________ Ronda Fairly Bernardo Heater, MD scs:cbb D: 01/19/2012 14:38:37 ET T: 01/19/2012 16:24:00 ET JOB#: [id number]  cc: Nicki Reaper C. Bernardo Heater, MD, <Dictator> Abbie Sons MD ELECTRONICALLY SIGNED 01/24/2012 18:16

## 2015-03-18 ENCOUNTER — Encounter: Payer: Self-pay | Admitting: Internal Medicine

## 2015-03-18 DIAGNOSIS — Z0289 Encounter for other administrative examinations: Secondary | ICD-10-CM

## 2015-05-06 ENCOUNTER — Other Ambulatory Visit: Payer: Self-pay | Admitting: Internal Medicine

## 2015-05-25 ENCOUNTER — Other Ambulatory Visit: Payer: Self-pay | Admitting: Internal Medicine

## 2015-05-27 ENCOUNTER — Other Ambulatory Visit: Payer: Self-pay | Admitting: Internal Medicine

## 2015-07-08 ENCOUNTER — Telehealth: Payer: Self-pay | Admitting: *Deleted

## 2015-07-08 MED ORDER — COLCHICINE 0.6 MG PO TABS: 0.6000 mg | ORAL_TABLET | Freq: Two times a day (BID) | ORAL | Status: DC

## 2015-07-08 NOTE — Telephone Encounter (Signed)
Spoke with pt, advised of Rx and stopping medication.  Pt verbalized understanding.  Rx sent to pharmacy and appoint scheduled.

## 2015-07-08 NOTE — Telephone Encounter (Signed)
I sent in rx for colchicine #30 with no refills.  He will need to hold the pravastatin - while on colchicine - then restart.  Needs to schedule a f/u appt with me (49min).

## 2015-07-08 NOTE — Telephone Encounter (Signed)
Pt called states he is having a gout flare.  Pt is requesting a refill.  Last OV 2.15.16.  Please advise

## 2015-07-23 ENCOUNTER — Encounter: Payer: Self-pay | Admitting: Family Medicine

## 2015-07-23 ENCOUNTER — Ambulatory Visit: Payer: Self-pay | Admitting: Internal Medicine

## 2015-07-23 ENCOUNTER — Ambulatory Visit (INDEPENDENT_AMBULATORY_CARE_PROVIDER_SITE_OTHER): Payer: Self-pay | Admitting: Family Medicine

## 2015-07-23 VITALS — BP 120/82 | HR 78 | Temp 98.4°F | Ht 68.0 in | Wt 205.4 lb

## 2015-07-23 DIAGNOSIS — J209 Acute bronchitis, unspecified: Secondary | ICD-10-CM

## 2015-07-23 DIAGNOSIS — J40 Bronchitis, not specified as acute or chronic: Secondary | ICD-10-CM | POA: Insufficient documentation

## 2015-07-23 MED ORDER — HYDROCODONE-HOMATROPINE 5-1.5 MG/5ML PO SYRP: 5.0000 mL | ORAL_SOLUTION | Freq: Three times a day (TID) | ORAL | Status: DC | PRN

## 2015-07-23 MED ORDER — DOXYCYCLINE HYCLATE 100 MG PO TABS: 100.0000 mg | ORAL_TABLET | Freq: Two times a day (BID) | ORAL | Status: DC

## 2015-07-23 MED ORDER — ALBUTEROL SULFATE HFA 108 (90 BASE) MCG/ACT IN AERS: 2.0000 | INHALATION_SPRAY | Freq: Four times a day (QID) | RESPIRATORY_TRACT | Status: DC | PRN

## 2015-07-23 NOTE — Progress Notes (Signed)
   Subjective:  [name], male    DOB: 1971/07/21  Age: 44 y.o. MRN: [id number]  CC: Cough  HPI:  44 year old male presents for an acute visit with complaints of cough.  1) Cough  Patient reports a one-week history of productive cough.  He reports associated postnasal drip.  He's been taking NyQuil with little relief in his symptoms. No known exacerbating factors.  No associated fever. He does note chills.  He has 2 sick sons at home as well.  Cough is quite severe and interferes with sleep.  Social Hx - Nonsmoker.  Review of Systems  Constitutional: Positive for chills. Negative for fever.  HENT: Positive for postnasal drip.   Respiratory: Positive for cough.    Objective:  BP 120/82 mmHg  Pulse 78  Temp(Src) 98.4 F (36.9 C) (Oral)  Ht 5\' 8"  (1.727 m)  Wt 205 lb 6 oz (93.157 kg)  BMI 31.23 kg/m2  SpO2 96%  BP/Weight 07/23/2015 05/14/5283 11/17/2438  Systolic BP 102 725 366  Diastolic BP 82 80 80  Wt. (Lbs) 205.38 205.13 202.25  BMI 31.23 31.2 30.76   Physical Exam  Constitutional: He appears well-developed and well-nourished. No distress.  Neck: Neck supple.  Cardiovascular: Normal rate and regular rhythm.   No murmur heard. Pulmonary/Chest: Effort normal and breath sounds normal. No respiratory distress. He has no wheezes. He has no rales.  Coughing during exam.  Abdominal: Soft. He exhibits no distension. There is no tenderness. There is no rebound.  Lymphadenopathy:    He has no cervical adenopathy.    Lab Results  Component Value Date   WBC 8.7 12/30/2014   HGB 14.7 12/30/2014   HCT 43.2 12/30/2014   PLT 243.0 12/30/2014   GLUCOSE 105* 02/20/2015   CHOL 239* 02/20/2015   TRIG 359.0* 02/20/2015   HDL 36.80* 02/20/2015   LDLDIRECT 154.0 02/20/2015   ALT 39 02/20/2015   AST 24 02/20/2015   NA 138 02/20/2015   K 4.6 02/20/2015   CL 103 02/20/2015   CREATININE 1.18 02/20/2015   BUN 11 02/20/2015   CO2 28 02/20/2015   TSH 2.91  12/30/2014   PSA 0.52 02/20/2015   HGBA1C 5.9 02/20/2015    Assessment & Plan:   Problem List Items Addressed This Visit    Acute bronchitis - Primary    Likely viral in nature, however given severity of systems will give antibiotic (to be used if he fails to improve). Hycodan for cough. PRN Albuterol.         Meds ordered this encounter  Medications  . doxycycline (VIBRA-TABS) 100 MG tablet    Sig: Take 1 tablet (100 mg total) by mouth 2 (two) times daily.    Dispense:  20 tablet    Refill:  0  . albuterol (PROVENTIL HFA;VENTOLIN HFA) 108 (90 BASE) MCG/ACT inhaler    Sig: Inhale 2 puffs into the lungs every 6 (six) hours as needed for wheezing or shortness of breath.    Dispense:  1 Inhaler    Refill:  0  . HYDROcodone-homatropine (HYCODAN) 5-1.5 MG/5ML syrup    Sig: Take 5 mLs by mouth every 8 (eight) hours as needed for cough.    Dispense:  120 mL    Refill:  0   Follow-up: PRN   Thersa Salt, DO

## 2015-07-23 NOTE — Patient Instructions (Signed)
Take the medications as prescribed.  Call if you have trouble affording it.  Follow up if you worsen or fail to improve.  Take care  Dr. Lacinda Axon

## 2015-07-23 NOTE — Progress Notes (Signed)
Pre visit review using our clinic review tool, if applicable. No additional management support is needed unless otherwise documented below in the visit note. 

## 2015-07-23 NOTE — Assessment & Plan Note (Signed)
Likely viral in nature, however given severity of systems will give antibiotic (to be used if he fails to improve). Hycodan for cough. PRN Albuterol.

## 2015-07-31 ENCOUNTER — Encounter: Payer: Self-pay | Admitting: Internal Medicine

## 2015-07-31 ENCOUNTER — Ambulatory Visit (INDEPENDENT_AMBULATORY_CARE_PROVIDER_SITE_OTHER): Payer: Self-pay | Admitting: Internal Medicine

## 2015-07-31 VITALS — BP 110/80 | HR 73 | Temp 98.5°F | Ht 68.0 in | Wt 204.0 lb

## 2015-07-31 DIAGNOSIS — I10 Essential (primary) hypertension: Secondary | ICD-10-CM

## 2015-07-31 DIAGNOSIS — E7800 Pure hypercholesterolemia, unspecified: Secondary | ICD-10-CM

## 2015-07-31 DIAGNOSIS — M79672 Pain in left foot: Secondary | ICD-10-CM

## 2015-07-31 DIAGNOSIS — E669 Obesity, unspecified: Secondary | ICD-10-CM

## 2015-07-31 DIAGNOSIS — J019 Acute sinusitis, unspecified: Secondary | ICD-10-CM

## 2015-07-31 DIAGNOSIS — R739 Hyperglycemia, unspecified: Secondary | ICD-10-CM

## 2015-07-31 MED ORDER — CEFDINIR 300 MG PO CAPS: 300.0000 mg | ORAL_CAPSULE | Freq: Two times a day (BID) | ORAL | Status: DC

## 2015-07-31 NOTE — Patient Instructions (Signed)
Saline nasal spray - flush nose at least 2-3x/day  nasacort nasal spray - 2 sprays each nostril one time per day.  Do this in the evening.    mucinex - one tablet in the morning and robitussin in the evening.    Align - one per day while on antibiotics and for two weeks after complete antibiotics.

## 2015-07-31 NOTE — Progress Notes (Signed)
Pre-visit discussion using our clinic review tool. No additional management support is needed unless otherwise documented below in the visit note.  

## 2015-07-31 NOTE — Progress Notes (Signed)
[name], male   DOB: 11/01/1971, 43 y.o.   MRN: [id number]   Subjective:    [name], male    DOB: [birth year], 44 y.o.   MRN: [id number]  HPI  Patient here for follow up regarding increased cough and congestion and gout.  Was seen recently by Dr Lacinda Axon.  See his note for details.  Took doxycycline.  Taking cough medication.  Still with increased cough and congestion.  Increased drainage.  Green mucus production now.  Increased cough and some chest congestion.  Productive of yellow mucus production.  Has chronic back pain.  Seeing Dr Mauri Pole.  Eating and drinking.  No nausea or vomiting.  Bowels stable.  Toe is better.     Past Medical History  Diagnosis Date  . Migraines   . Allergy   . Hypertension   . Hypercholesterolemia   . Chronic back pain    Past Surgical History  Procedure Laterality Date  . Cholecystectomy    . Back surgery  08/12/14    multiple  . Knee arthroscopy      Dr HBK   Family History  Problem Relation Age of Onset  . Cancer Father     germ cell (retroperitoneal)  . Coronary artery disease      s/p stent (age 3)  . Hypertension Paternal Grandfather   . Kidney cancer Paternal Grandfather   . Diabetes Paternal Grandfather   . Diabetes Maternal Grandfather   . Heart disease Cousin     MI - age 53   Social History   Social History  . Marital Status: Single    Spouse Name: N/A  . Number of Children: 2  . Years of Education: N/A   Occupational History  .     Social History Main Topics  . Smoking status: Never Smoker   . Smokeless tobacco: Never Used  . Alcohol Use: No  . Drug Use: No  . Sexual Activity: Not Asked   Other Topics Concern  . None   Social History Narrative    Outpatient Encounter Prescriptions as of 07/31/2015  Medication Sig  . albuterol (PROVENTIL HFA;VENTOLIN HFA) 108 (90 BASE) MCG/ACT inhaler Inhale 2 puffs into the lungs every 6 (six) hours as needed for wheezing or shortness of breath.  .  carisoprodol (SOMA) 350 MG tablet Take 350 mg by mouth 4 (four) times daily as needed for muscle spasms.   . colchicine 0.6 MG tablet Take 1 tablet (0.6 mg total) by mouth 2 (two) times daily.  Marland Kitchen doxycycline (VIBRA-TABS) 100 MG tablet Take 1 tablet (100 mg total) by mouth 2 (two) times daily.  . fluticasone (FLONASE) 50 MCG/ACT nasal spray Place 2 sprays into both nostrils as needed.  Marland Kitchen HYDROcodone-acetaminophen (NORCO/VICODIN) 5-325 MG per tablet Take 1 tablet by mouth 4 (four) times daily as needed.  . metoprolol succinate (TOPROL-XL) 50 MG 24 hr tablet TAKE 2 TABLETS BY MOUTH EVERY DAY  . naproxen (NAPROSYN) 500 MG tablet Take 1 tablet (500 mg total) by mouth 2 (two) times daily with a meal.  . Oxycodone HCl 10 MG TABS Take 1 tablet by mouth 3 (three) times daily.  . pravastatin (PRAVACHOL) 10 MG tablet TAKE 1 TABLET (10 MG TOTAL) BY MOUTH DAILY.  Marland Kitchen triamterene-hydrochlorothiazide (MAXZIDE-25) 37.5-25 MG per tablet TAKE 1 TABLET BY MOUTH DAILY.  . [DISCONTINUED] HYDROcodone-homatropine (HYCODAN) 5-1.5 MG/5ML syrup Take 5 mLs by mouth every 8 (eight) hours as needed for cough.  . cefdinir (OMNICEF)  300 MG capsule Take 1 capsule (300 mg total) by mouth 2 (two) times daily.   No facility-administered encounter medications on file as of 07/31/2015.    Review of Systems  Constitutional: Negative for fever and appetite change.  HENT: Positive for congestion, postnasal drip and sinus pressure.   Eyes: Negative for pain and discharge.  Respiratory: Positive for cough. Negative for chest tightness.        Cough productive of yellow mucus.   Cardiovascular: Negative for chest pain and palpitations.  Gastrointestinal: Negative for nausea, vomiting, abdominal pain and diarrhea.  Genitourinary: Negative for dysuria and difficulty urinating.  Musculoskeletal:       Toe pain is better.   Skin: Negative for color change and rash.  Neurological: Negative for dizziness, light-headedness and headaches.    Psychiatric/Behavioral: Negative for dysphoric mood and agitation.       Objective:    Physical Exam  Constitutional: He appears well-developed and well-nourished. No distress.  HENT:  Minimal tenderness to palpation over the sinuses.  Nares with slightly erythematous turbinates.  Oropharynx without lesions.  TMs without erythema.   Neck: Neck supple.  Cardiovascular: Normal rate and regular rhythm.   Pulmonary/Chest: Effort normal and breath sounds normal. No respiratory distress. He has no wheezes.  Abdominal: Soft. Bowel sounds are normal. There is no tenderness.  Musculoskeletal: He exhibits no edema or tenderness.  Lymphadenopathy:    He has no cervical adenopathy.  Skin: No rash noted. No erythema.  Psychiatric: He has a normal mood and affect. His behavior is normal.    BP 110/80 mmHg  Pulse 73  Temp(Src) 98.5 F (36.9 C) (Oral)  Ht 5\' 8"  (1.727 m)  Wt 204 lb (92.534 kg)  BMI 31.03 kg/m2  SpO2 97% Wt Readings from Last 3 Encounters:  07/31/15 204 lb (92.534 kg)  07/23/15 205 lb 6 oz (93.157 kg)  12/30/14 205 lb 2 oz (93.044 kg)     Lab Results  Component Value Date   WBC 8.7 12/30/2014   HGB 14.7 12/30/2014   HCT 43.2 12/30/2014   PLT 243.0 12/30/2014   GLUCOSE 105* 02/20/2015   CHOL 239* 02/20/2015   TRIG 359.0* 02/20/2015   HDL 36.80* 02/20/2015   LDLDIRECT 154.0 02/20/2015   ALT 39 02/20/2015   AST 24 02/20/2015   NA 138 02/20/2015   K 4.6 02/20/2015   CL 103 02/20/2015   CREATININE 1.18 02/20/2015   BUN 11 02/20/2015   CO2 28 02/20/2015   TSH 2.91 12/30/2014   PSA 0.52 02/20/2015   HGBA1C 5.9 02/20/2015       Assessment & Plan:   Problem List Items Addressed This Visit    Foot pain, left    Better.  Felt like gout flare.  Follow.       Hypercholesterolemia    Low cholesterol diet and exercise.  On pravastatin.  Needs labs.  Does not have insurance at this time.  Wants to hold on labs.  Follow.        Hyperglycemia    Low carb diet  and exercise.  Follow.        Hypertension - Primary    Blood pressure under good control.  Continue same medication regimen.  Follow pressures.  Follow metabolic panel.        Obesity (BMI 30-39.9)    Diet and exercise.        Sinusitis    Sinusitis with URI.  With increased cough and congestion and increased mucus production.  Treat with omnicef bid as directed.  mucinex in the am and robitussin in the evening.  Saline nasal spray and nasacort as directed.  Follow.        Relevant Medications   cefdinir (OMNICEF) 300 MG capsule       Einar Pheasant, MD

## 2015-08-02 ENCOUNTER — Encounter: Payer: Self-pay | Admitting: Internal Medicine

## 2015-08-02 NOTE — Assessment & Plan Note (Signed)
Blood pressure under good control.  Continue same medication regimen.  Follow pressures.  Follow metabolic panel.   

## 2015-08-02 NOTE — Assessment & Plan Note (Signed)
Better.  Felt like gout flare.  Follow.

## 2015-08-02 NOTE — Assessment & Plan Note (Signed)
Diet and exercise.   

## 2015-08-02 NOTE — Assessment & Plan Note (Signed)
Low cholesterol diet and exercise.  On pravastatin.  Needs labs.  Does not have insurance at this time.  Wants to hold on labs.  Follow.

## 2015-08-02 NOTE — Assessment & Plan Note (Signed)
Low carb diet and exercise.  Follow.  

## 2015-08-02 NOTE — Assessment & Plan Note (Signed)
Sinusitis with URI.  With increased cough and congestion and increased mucus production.  Treat with omnicef bid as directed.  mucinex in the am and robitussin in the evening.  Saline nasal spray and nasacort as directed.  Follow.

## 2015-08-25 ENCOUNTER — Other Ambulatory Visit: Payer: Self-pay | Admitting: Internal Medicine

## 2015-09-29 ENCOUNTER — Encounter: Payer: Self-pay | Admitting: Internal Medicine

## 2015-09-29 DIAGNOSIS — Z0289 Encounter for other administrative examinations: Secondary | ICD-10-CM

## 2015-11-24 ENCOUNTER — Ambulatory Visit (INDEPENDENT_AMBULATORY_CARE_PROVIDER_SITE_OTHER): Payer: Self-pay | Admitting: Internal Medicine

## 2015-11-24 ENCOUNTER — Encounter: Payer: Self-pay | Admitting: Internal Medicine

## 2015-11-24 VITALS — BP 120/82 | HR 83 | Temp 98.6°F | Resp 18 | Ht 68.0 in | Wt 211.8 lb

## 2015-11-24 DIAGNOSIS — M25462 Effusion, left knee: Secondary | ICD-10-CM

## 2015-11-24 DIAGNOSIS — I10 Essential (primary) hypertension: Secondary | ICD-10-CM

## 2015-11-24 DIAGNOSIS — M25562 Pain in left knee: Secondary | ICD-10-CM

## 2015-11-24 LAB — CBC WITH DIFFERENTIAL/PLATELET
Basophils Absolute: 0 10*3/uL (ref 0.0–0.1)
Basophils Relative: 0.4 % (ref 0.0–3.0)
Eosinophils Absolute: 0.1 10*3/uL (ref 0.0–0.7)
Eosinophils Relative: 1.4 % (ref 0.0–5.0)
HCT: 45.3 % (ref 39.0–52.0)
Hemoglobin: 14.8 g/dL (ref 13.0–17.0)
Lymphocytes Relative: 33.2 % (ref 12.0–46.0)
Lymphs Abs: 3.5 10*3/uL (ref 0.7–4.0)
MCHC: 32.6 g/dL (ref 30.0–36.0)
MCV: 88.6 fl (ref 78.0–100.0)
Monocytes Absolute: 1.1 10*3/uL — ABNORMAL HIGH (ref 0.1–1.0)
Monocytes Relative: 10.3 % (ref 3.0–12.0)
Neutro Abs: 5.8 10*3/uL (ref 1.4–7.7)
Neutrophils Relative %: 54.7 % (ref 43.0–77.0)
Platelets: 239 10*3/uL (ref 150.0–400.0)
RBC: 5.12 Mil/uL (ref 4.22–5.81)
RDW: 13.2 % (ref 11.5–15.5)
WBC: 10.6 10*3/uL — ABNORMAL HIGH (ref 4.0–10.5)

## 2015-11-24 LAB — BASIC METABOLIC PANEL
BUN: 15 mg/dL (ref 6–23)
CO2: 29 mEq/L (ref 19–32)
Calcium: 10.2 mg/dL (ref 8.4–10.5)
Chloride: 100 mEq/L (ref 96–112)
Creatinine, Ser: 1.17 mg/dL (ref 0.40–1.50)
GFR: 71.16 mL/min (ref >60.00)
Glucose, Bld: 112 mg/dL — ABNORMAL HIGH (ref 70–99)
Potassium: 3.7 mEq/L (ref 3.5–5.1)
Sodium: 138 mEq/L (ref 135–145)

## 2015-11-24 LAB — URIC ACID: Uric Acid, Serum: 8.9 mg/dL — ABNORMAL HIGH (ref 4.0–7.8)

## 2015-11-24 MED ORDER — METOPROLOL SUCCINATE ER 50 MG PO TB24: 100.0000 mg | ORAL_TABLET | Freq: Every day | ORAL | Status: DC

## 2015-11-24 MED ORDER — COLCHICINE 0.6 MG PO TABS: 0.6000 mg | ORAL_TABLET | Freq: Two times a day (BID) | ORAL | Status: DC

## 2015-11-24 MED ORDER — TRIAMTERENE-HCTZ 37.5-25 MG PO TABS: 1.0000 | ORAL_TABLET | Freq: Every day | ORAL | Status: DC

## 2015-11-24 MED ORDER — CEPHALEXIN 500 MG PO CAPS: 500.0000 mg | ORAL_CAPSULE | Freq: Three times a day (TID) | ORAL | Status: DC

## 2015-11-24 NOTE — Progress Notes (Signed)
Pre-visit discussion using our clinic review tool. No additional management support is needed unless otherwise documented below in the visit note.  

## 2015-11-24 NOTE — Progress Notes (Signed)
[name], male   DOB: [birth year], 45 y.o.   MRN: [id number]   Subjective:    [name], male    DOB: 1971/11/15, 45 y.o.   MRN: [id number]  HPI  Patient with past history of hypercholesterolemia, chronic back pain and hypertension.  He comes in today as work in with concerns regarding knee pain and swelling.  The pain and swelling started a few days ago.  Hurts to bend.  Unable to stand and walk without increased pain.  Hurts if anything touches the knee.  Has a history of gout.  Question if gout flare.  He is unable to take prednisone secondary to increased heart rate.  On ibuprofen three tablets bid.  Already on narcotic medication.     Past Medical History  Diagnosis Date  . Migraines   . Allergy   . Hypertension   . Hypercholesterolemia   . Chronic back pain    Past Surgical History  Procedure Laterality Date  . Cholecystectomy    . Back surgery  08/12/14    multiple  . Knee arthroscopy      Dr HBK   Family History  Problem Relation Age of Onset  . Cancer Father     germ cell (retroperitoneal)  . Coronary artery disease      s/p stent (age 51)  . Hypertension Paternal Grandfather   . Kidney cancer Paternal Grandfather   . Diabetes Paternal Grandfather   . Diabetes Maternal Grandfather   . Heart disease Cousin     MI - age 32   Social History   Social History  . Marital Status: Single    Spouse Name: N/A  . Number of Children: 2  . Years of Education: N/A   Occupational History  .     Social History Main Topics  . Smoking status: Never Smoker   . Smokeless tobacco: Never Used  . Alcohol Use: No  . Drug Use: No  . Sexual Activity: Not Asked   Other Topics Concern  . None   Social History Narrative    Outpatient Encounter Prescriptions as of 11/24/2015  Medication Sig  . albuterol (PROVENTIL HFA;VENTOLIN HFA) 108 (90 BASE) MCG/ACT inhaler Inhale 2 puffs into the lungs every 6 (six) hours as needed for wheezing or  shortness of breath.  . carisoprodol (SOMA) 350 MG tablet Take 350 mg by mouth 4 (four) times daily as needed for muscle spasms.   . colchicine 0.6 MG tablet Take 1 tablet (0.6 mg total) by mouth 2 (two) times daily.  . fluticasone (FLONASE) 50 MCG/ACT nasal spray Place 2 sprays into both nostrils as needed.  . metoprolol succinate (TOPROL-XL) 50 MG 24 hr tablet Take 2 tablets (100 mg total) by mouth daily. Take with or immediately following a meal.  . naproxen (NAPROSYN) 500 MG tablet Take 1 tablet (500 mg total) by mouth 2 (two) times daily with a meal.  . Oxycodone HCl 10 MG TABS Take 1 tablet by mouth 3 (three) times daily.  Marland Kitchen triamterene-hydrochlorothiazide (MAXZIDE-25) 37.5-25 MG tablet Take 1 tablet by mouth daily.  . [DISCONTINUED] colchicine 0.6 MG tablet Take 1 tablet (0.6 mg total) by mouth 2 (two) times daily.  . [DISCONTINUED] metoprolol succinate (TOPROL-XL) 50 MG 24 hr tablet TAKE 2 TABLETS BY MOUTH EVERY DAY  . [DISCONTINUED] metoprolol succinate (TOPROL-XL) 50 MG 24 hr tablet TAKE 2 TABLETS BY MOUTH EVERY DAY  . [DISCONTINUED] pravastatin (PRAVACHOL) 10 MG tablet TAKE 1 TABLET (  10 MG TOTAL) BY MOUTH DAILY.  . [DISCONTINUED] triamterene-hydrochlorothiazide (MAXZIDE-25) 37.5-25 MG per tablet TAKE 1 TABLET BY MOUTH DAILY.  . cephALEXin (KEFLEX) 500 MG capsule Take 1 capsule (500 mg total) by mouth 3 (three) times daily.  . [DISCONTINUED] cefdinir (OMNICEF) 300 MG capsule Take 1 capsule (300 mg total) by mouth 2 (two) times daily.  . [DISCONTINUED] doxycycline (VIBRA-TABS) 100 MG tablet Take 1 tablet (100 mg total) by mouth 2 (two) times daily.  . [DISCONTINUED] HYDROcodone-acetaminophen (NORCO/VICODIN) 5-325 MG per tablet Take 1 tablet by mouth 4 (four) times daily as needed.   No facility-administered encounter medications on file as of 11/24/2015.    Review of Systems  Constitutional: Negative for fever and appetite change.  Respiratory: Negative for cough, chest tightness and  shortness of breath.   Cardiovascular: Negative for chest pain, palpitations and leg swelling.  Gastrointestinal: Negative for nausea and vomiting.  Musculoskeletal:       Knee pain and swelling as outlined.  Increased soft tissue swelling - left knee.    Skin: Negative for rash and wound.  Psychiatric/Behavioral: Negative for dysphoric mood and agitation.       Objective:    Physical Exam  Constitutional: He appears well-developed and well-nourished. No distress.  Cardiovascular: Normal rate and regular rhythm.   Pulmonary/Chest: Effort normal and breath sounds normal. No respiratory distress.  Abdominal: Soft. Bowel sounds are normal. There is no tenderness.  Musculoskeletal: He exhibits no edema or tenderness.  Some increased soft tissue swelling and tenderness - left knee.  Increased pain with attempts with flexion and full extension at the knee.  Increased tenderness to light touch.  Some minimal increased erythema.    Skin: No rash noted.  Psychiatric: He has a normal mood and affect. His behavior is normal.    BP 120/82 mmHg  Pulse 83  Temp(Src) 98.6 F (37 C) (Oral)  Resp 18  Ht 5\' 8"  (1.727 m)  Wt 211 lb 12 oz (96.049 kg)  BMI 32.20 kg/m2  SpO2 97% Wt Readings from Last 3 Encounters:  11/24/15 211 lb 12 oz (96.049 kg)  07/31/15 204 lb (92.534 kg)  07/23/15 205 lb 6 oz (93.157 kg)     Lab Results  Component Value Date   WBC 10.6* 11/24/2015   HGB 14.8 11/24/2015   HCT 45.3 11/24/2015   PLT 239.0 11/24/2015   GLUCOSE 112* 11/24/2015   CHOL 239* 02/20/2015   TRIG 359.0* 02/20/2015   HDL 36.80* 02/20/2015   LDLDIRECT 154.0 02/20/2015   ALT 39 02/20/2015   AST 24 02/20/2015   NA 138 11/24/2015   K 3.7 11/24/2015   CL 100 11/24/2015   CREATININE 1.17 11/24/2015   BUN 15 11/24/2015   CO2 29 11/24/2015   TSH 2.91 12/30/2014   PSA 0.52 02/20/2015   HGBA1C 5.9 02/20/2015        Assessment & Plan:   Problem List Items Addressed This Visit     Hypertension    Blood pressure is under good control.  Same medication regimen.  Follow pressures.  Follow metabolic panel.        Relevant Medications   metoprolol succinate (TOPROL-XL) 50 MG 24 hr tablet   triamterene-hydrochlorothiazide (MAXZIDE-25) 37.5-25 MG tablet   Left knee pain - Primary    Increased pain and swelling as outlined.  Unable to put weight on his leg.  Increased pain - with light touch.  Appears to be c/w acute inflammatory process.  Question if gout.  Unable  to take prednisone.  Already on antiinflammatories.  On narcotic medication now.  Treat with colchicine.  Have ortho or rheumatology evaluate.  Question of need for aspiration.  Cover with keflex for possible infection.  Follow closely.        Relevant Orders   Ambulatory referral to Orthopedic Surgery   Basic metabolic panel (Completed)   CBC with Differential/Platelet (Completed)   Uric acid (Completed)    Other Visit Diagnoses    Knee swelling, left        Relevant Orders    Basic metabolic panel (Completed)    CBC with Differential/Platelet (Completed)    Uric acid (Completed)        Einar Pheasant, MD

## 2015-11-24 NOTE — Patient Instructions (Signed)
Take a probiotic one per day while on the antibiotics and for two weeks after completing the antibiotic.

## 2015-11-25 ENCOUNTER — Encounter: Payer: Self-pay | Admitting: Internal Medicine

## 2015-11-25 NOTE — Assessment & Plan Note (Signed)
Increased pain and swelling as outlined.  Unable to put weight on his leg.  Increased pain - with light touch.  Appears to be c/w acute inflammatory process.  Question if gout.  Unable to take prednisone.  Already on antiinflammatories.  On narcotic medication now.  Treat with colchicine.  Have ortho or rheumatology evaluate.  Question of need for aspiration.  Cover with keflex for possible infection.  Follow closely.

## 2015-11-25 NOTE — Assessment & Plan Note (Signed)
Blood pressure is under good control.  Same medication regimen.  Follow pressures.  Follow metabolic panel.   

## 2015-12-23 ENCOUNTER — Telehealth: Payer: Self-pay | Admitting: Internal Medicine

## 2015-12-23 MED ORDER — COLCHICINE 0.6 MG PO TABS: 0.6000 mg | ORAL_TABLET | Freq: Two times a day (BID) | ORAL | Status: DC

## 2015-12-23 NOTE — Telephone Encounter (Signed)
colchicine refilled

## 2015-12-23 NOTE — Telephone Encounter (Signed)
Pt called about needing another prescription for colchicine 0.6 MG tablet. Call pt @ [phone]. Pharmacy is [id number] - [place], [address] Thank you!

## 2016-03-22 ENCOUNTER — Encounter: Payer: Self-pay | Admitting: Podiatry

## 2016-03-22 NOTE — Patient Instructions (Signed)

## 2016-04-05 ENCOUNTER — Ambulatory Visit
Admission: RE | Admit: 2016-04-05 | Discharge: 2016-04-05 | Disposition: A | Discharge status: Home / Self Care | Payer: Disability Insurance | Source: Ambulatory Visit | Attending: General Practice | Admitting: General Practice

## 2016-04-05 ENCOUNTER — Other Ambulatory Visit: Payer: Self-pay | Admitting: General Practice

## 2016-04-05 DIAGNOSIS — Z981 Arthrodesis status: Secondary | ICD-10-CM | POA: Insufficient documentation

## 2016-04-05 DIAGNOSIS — M5136 Other intervertebral disc degeneration, lumbar region: Secondary | ICD-10-CM

## 2016-04-15 NOTE — Progress Notes (Signed)
This encounter was created in error - please disregard.

## 2016-06-21 ENCOUNTER — Ambulatory Visit: Payer: Self-pay | Admitting: Family Medicine

## 2016-11-04 ENCOUNTER — Other Ambulatory Visit: Payer: Self-pay | Admitting: Internal Medicine

## 2016-12-08 ENCOUNTER — Encounter: Payer: Self-pay | Admitting: Family

## 2016-12-08 ENCOUNTER — Ambulatory Visit (INDEPENDENT_AMBULATORY_CARE_PROVIDER_SITE_OTHER): Payer: Self-pay | Admitting: Family

## 2016-12-08 VITALS — BP 124/80 | HR 89 | Temp 98.3°F | Ht 68.0 in | Wt 213.4 lb

## 2016-12-08 DIAGNOSIS — J40 Bronchitis, not specified as acute or chronic: Secondary | ICD-10-CM

## 2016-12-08 DIAGNOSIS — E162 Hypoglycemia, unspecified: Secondary | ICD-10-CM | POA: Insufficient documentation

## 2016-12-08 LAB — POCT INFLUENZA A/B
Influenza A, POC: NEGATIVE
Influenza B, POC: NEGATIVE

## 2016-12-08 MED ORDER — ALBUTEROL SULFATE HFA 108 (90 BASE) MCG/ACT IN AERS: 2.0000 | INHALATION_SPRAY | Freq: Four times a day (QID) | RESPIRATORY_TRACT | 0 refills | Status: DC | PRN

## 2016-12-08 MED ORDER — BENZONATATE 100 MG PO CAPS: 100.0000 mg | ORAL_CAPSULE | Freq: Three times a day (TID) | ORAL | 1 refills | Status: DC | PRN

## 2016-12-08 NOTE — Assessment & Plan Note (Signed)
Afebrile. No adventitious lung sounds. Patient and I agreed he would continue antibiotic. Return precautions given

## 2016-12-08 NOTE — Progress Notes (Signed)
Subjective:    [name], male    DOB: May 22, 1971, 46 y.o.   MRN: [id number]  CC: Cameron Sanchez is a 46 y.o. male who presents today for an acute visit.    HPI: CC; productive cough, sore throat x one week improved today. Endorses chest tightness,  Ear pressure, wheezing. Congestion makes cough, gag. Has used an inhaler in the past.   Tried nyquil with some relief. Tried flonase without relief. Seen at walk in clinic 5 days ago and given minocycline.   C/o dizziness for past several months has self limiting episodes of fatigue, dizziness. No LOC. Endorses heart racing,  has to 'sit down' and 'eats something sweet'. Concern for DM as significant for family.   Eats once a day. Denies exertional chest pain or pressure, numbness or tingling radiating to left arm or jaw,frequent headaches, changes in vision, or shortness of breath during these episodes.   Doesn't check BP at home.     HISTORY:  Past Medical History:  Diagnosis Date  . Allergy   . Chronic back pain   . Hypercholesterolemia   . Hypertension   . Migraines    Past Surgical History:  Procedure Laterality Date  . BACK SURGERY  08/12/14   multiple  . CHOLECYSTECTOMY    . KNEE ARTHROSCOPY     Dr HBK   Family History  Problem Relation Age of Onset  . Cancer Father     germ cell (retroperitoneal)  . Coronary artery disease      s/p stent (age 61)  . Hypertension Paternal Grandfather   . Kidney cancer Paternal Grandfather   . Diabetes Paternal Grandfather   . Diabetes Maternal Grandfather   . Heart disease Cousin     MI - age 99    Allergies: Patient has no known allergies. Current Outpatient Prescriptions on File Prior to Visit  Medication Sig Dispense Refill  . carisoprodol (SOMA) 350 MG tablet Take 350 mg by mouth 4 (four) times daily as needed for muscle spasms.     . cephALEXin (KEFLEX) 500 MG capsule Take 1 capsule (500 mg total) by mouth 3 (three) times daily. 21 capsule 0  . colchicine  0.6 MG tablet Take 1 tablet (0.6 mg total) by mouth 2 (two) times daily. 60 tablet 11  . fluticasone (FLONASE) 50 MCG/ACT nasal spray Place 2 sprays into both nostrils as needed.    . metoprolol succinate (TOPROL-XL) 50 MG 24 hr tablet TAKE 2 TABLETS (100 MG TOTAL) BY MOUTH DAILY. TAKE WITH OR IMMEDIATELY FOLLOWING A MEAL. 60 tablet 0  . naproxen (NAPROSYN) 500 MG tablet Take 1 tablet (500 mg total) by mouth 2 (two) times daily with a meal. 30 tablet 0  . Oxycodone HCl 10 MG TABS Take 1 tablet by mouth 3 (three) times daily.  0  . triamterene-hydrochlorothiazide (MAXZIDE-25) 37.5-25 MG tablet TAKE 1 TABLET BY MOUTH DAILY. 30 tablet 0   No current facility-administered medications on file prior to visit.     Social History  Substance Use Topics  . Smoking status: Never Smoker  . Smokeless tobacco: Never Used  . Alcohol use No    Review of Systems  Constitutional: Negative for chills and fever.  HENT: Positive for congestion, ear pain, sinus pain, sinus pressure and sore throat.   Respiratory: Positive for cough and wheezing. Negative for shortness of breath.   Cardiovascular: Negative for chest pain and palpitations.  Gastrointestinal: Negative for nausea and vomiting.  Neurological:  Positive for dizziness and light-headedness. Negative for seizures and syncope.      Objective:    BP 124/80   Pulse 89   Temp 98.3 F (36.8 C) (Oral)   Ht 5\' 8"  (1.727 m)   Wt 213 lb 6.4 oz (96.8 kg)   SpO2 98%   BMI 32.45 kg/m    Physical Exam  Constitutional: Vital signs are normal. He appears well-developed and well-nourished.  HENT:  Head: Normocephalic and atraumatic.  Right Ear: Hearing, tympanic membrane, external ear and ear canal normal. No drainage, swelling or tenderness. Tympanic membrane is not injected, not erythematous and not bulging. No middle ear effusion. No decreased hearing is noted.  Left Ear: Hearing, tympanic membrane, external ear and ear canal normal. No drainage,  swelling or tenderness. Tympanic membrane is not injected, not erythematous and not bulging.  No middle ear effusion. No decreased hearing is noted.  Nose: Nose normal. Right sinus exhibits no maxillary sinus tenderness and no frontal sinus tenderness. Left sinus exhibits no maxillary sinus tenderness and no frontal sinus tenderness.  Mouth/Throat: Uvula is midline, oropharynx is clear and moist and mucous membranes are normal. No oropharyngeal exudate, posterior oropharyngeal edema, posterior oropharyngeal erythema or tonsillar abscesses.  Eyes: Conjunctivae are normal.  Cardiovascular: Regular rhythm and normal heart sounds.   Pulmonary/Chest: Effort normal and breath sounds normal. No respiratory distress. He has no wheezes. He has no rhonchi. He has no rales.  Lymphadenopathy:       Head (right side): No submental, no submandibular, no tonsillar, no preauricular, no posterior auricular and no occipital adenopathy present.       Head (left side): No submental, no submandibular, no tonsillar, no preauricular, no posterior auricular and no occipital adenopathy present.    He has no cervical adenopathy.  Neurological: He is alert.  Skin: Skin is warm and dry.  Psychiatric: He has a normal mood and affect. His speech is normal and behavior is normal.  Vitals reviewed.  Patient felt significantly better after albuterol treatment. Lung sounds clear and increased     Assessment & Plan:   Problem List Items Addressed This Visit      Respiratory   Bronchitis - Primary    Afebrile. No adventitious lung sounds. Patient and I agreed he would continue antibiotic. Return precautions given      Relevant Medications   albuterol (PROVENTIL HFA;VENTOLIN HFA) 108 (90 Base) MCG/ACT inhaler   benzonatate (TESSALON PERLES) 100 MG capsule   Other Relevant Orders   POCT Influenza A/B (Completed)     Endocrine   Hypoglycemia    New.Episodes most consistent with hypoglycemia and consistently respond to  sugar. Patient's only eating once a day. He wasvery explicit that he is not having any chest pain, SOB during these episodes. No syncope. We jointly agreed that he would first try to eat regular meals, if unable to eat a meal, and encouraged him to buy protein bars, protein shake. advised to buy blood pressure cuff, as I'm considering hypotension from his blood pressure medications. Pending A1c. Advised patient to follow-up with PCP.      Relevant Orders   Hemoglobin A1c          I am having Mr. Simons start on benzonatate. I am also having him maintain his carisoprodol, naproxen, Oxycodone HCl, fluticasone, cephALEXin, colchicine, metoprolol succinate, triamterene-hydrochlorothiazide, indomethacin, and albuterol.   Meds ordered this encounter  Medications  . indomethacin (INDOCIN) 50 MG capsule    Sig: Reported on 12/11/2015  .  albuterol (PROVENTIL HFA;VENTOLIN HFA) 108 (90 Base) MCG/ACT inhaler    Sig: Inhale 2 puffs into the lungs every 6 (six) hours as needed for wheezing or shortness of breath.    Dispense:  1 Inhaler    Refill:  0    Order Specific Question:   Supervising Provider    Answer:   Derrel Nip, TERESA L [[id number]]  . benzonatate (TESSALON PERLES) 100 MG capsule    Sig: Take 1 capsule (100 mg total) by mouth 3 (three) times daily as needed for cough.    Dispense:  30 capsule    Refill:  1    Order Specific Question:   Supervising Provider    Answer:   Crecencio Mc [[id number]]    Return precautions given.   Risks, benefits, and alternatives of the medications and treatment plan prescribed today were discussed, and patient expressed understanding.   Education regarding symptom management and diagnosis given to patient on AVS.  Continue to follow with Einar Pheasant, MD for routine health maintenance.   Cameron Sanchez and I agreed with plan.   Mable Paris, FNP

## 2016-12-08 NOTE — Patient Instructions (Addendum)
Probiotics  Continue antibiotic since seeing improvement.   Lots of fluid.   Tessalon as needed for cough  Albuterol for wheezing  Try eating more regular meals to see if this limits episodes.  Buy blood pressure cuff and see if blood pressure on low side ( may be from blood pressure medications).   Make follow up appt with Dr. Nicki Reaper  Lab appt

## 2016-12-08 NOTE — Assessment & Plan Note (Addendum)
New.Episodes most consistent with hypoglycemia and consistently respond to sugar. Patient's only eating once a day. He wasvery explicit that he is not having any chest pain, SOB during these episodes. No syncope. We jointly agreed that he would first try to eat regular meals, if unable to eat a meal, and encouraged him to buy protein bars, protein shake. advised to buy blood pressure cuff, as I'm considering hypotension from his blood pressure medications. Pending A1c. Advised patient to follow-up with PCP.

## 2016-12-08 NOTE — Progress Notes (Signed)
Pre visit review using our clinic review tool, if applicable. No additional management support is needed unless otherwise documented below in the visit note. 

## 2016-12-09 ENCOUNTER — Other Ambulatory Visit (INDEPENDENT_AMBULATORY_CARE_PROVIDER_SITE_OTHER): Payer: Self-pay

## 2016-12-09 DIAGNOSIS — E162 Hypoglycemia, unspecified: Secondary | ICD-10-CM

## 2016-12-09 LAB — HEMOGLOBIN A1C: Hgb A1c MFr Bld: 6.1 % (ref 4.6–6.5)

## 2016-12-13 ENCOUNTER — Telehealth: Payer: Self-pay | Admitting: *Deleted

## 2016-12-13 NOTE — Telephone Encounter (Signed)
Spoke to pt -  Mainly congestion bothering  Him, especially in back of throat.   'Some better. ' No fever.   Completed minocycline yesterday ( 10 day course).   Never tried Mucinex.  Jointly agreed he would trial mucinex BID and call and let me know how he is doing

## 2016-12-13 NOTE — Telephone Encounter (Signed)
Reason for call: not feeling better after finishing  antibiotic  Symptoms: stuffy nose, sinus drainage, sore throat right sided, no fever, cough dry sometimes productive, non productive, worse at bed time, No shortness of breath, right ear pressure  Duration 12/08/16  Medications: Nyquil, Tessalon Perles  Last seen for this problem:12/08/16   Seen by: Mable Paris, FNP  Please advise

## 2016-12-13 NOTE — Telephone Encounter (Signed)
Pt was advised to call office if he was not feeling any better after completing his antibiotic. Pt was seen on 12/09/15.  Pt currently has a cough and right ear pain, he requested a call to discuss further care.  Pt contact [phone]

## 2016-12-20 ENCOUNTER — Other Ambulatory Visit: Payer: Self-pay | Admitting: Internal Medicine

## 2016-12-20 NOTE — Telephone Encounter (Signed)
Patient must keep appt for further refills

## 2017-02-18 ENCOUNTER — Ambulatory Visit (INDEPENDENT_AMBULATORY_CARE_PROVIDER_SITE_OTHER): Payer: Self-pay | Admitting: Internal Medicine

## 2017-02-18 ENCOUNTER — Encounter: Payer: Self-pay | Admitting: Internal Medicine

## 2017-02-18 VITALS — BP 120/76 | HR 67 | Temp 98.6°F | Resp 14 | Ht 68.0 in | Wt 216.8 lb

## 2017-02-18 DIAGNOSIS — E7800 Pure hypercholesterolemia, unspecified: Secondary | ICD-10-CM

## 2017-02-18 DIAGNOSIS — M545 Low back pain: Secondary | ICD-10-CM

## 2017-02-18 DIAGNOSIS — E669 Obesity, unspecified: Secondary | ICD-10-CM

## 2017-02-18 DIAGNOSIS — R079 Chest pain, unspecified: Secondary | ICD-10-CM

## 2017-02-18 DIAGNOSIS — M109 Gout, unspecified: Secondary | ICD-10-CM

## 2017-02-18 DIAGNOSIS — I10 Essential (primary) hypertension: Secondary | ICD-10-CM

## 2017-02-18 DIAGNOSIS — R739 Hyperglycemia, unspecified: Secondary | ICD-10-CM

## 2017-02-18 DIAGNOSIS — G8929 Other chronic pain: Secondary | ICD-10-CM

## 2017-02-18 DIAGNOSIS — K219 Gastro-esophageal reflux disease without esophagitis: Secondary | ICD-10-CM

## 2017-02-18 LAB — CBC WITH DIFFERENTIAL/PLATELET
Basophils Absolute: 0 cells/uL (ref 0–200)
Basophils Relative: 0 %
Eosinophils Absolute: 162 cells/uL (ref 15–500)
Eosinophils Relative: 2 %
HCT: 40.6 % (ref 38.5–50.0)
Hemoglobin: 13.9 g/dL (ref 13.2–17.1)
Lymphocytes Relative: 42 %
Lymphs Abs: 3402 cells/uL (ref 850–3900)
MCH: 29.8 pg (ref 27.0–33.0)
MCHC: 34.2 g/dL (ref 32.0–36.0)
MCV: 86.9 fL (ref 80.0–100.0)
MPV: 11.6 fL (ref 7.5–12.5)
Monocytes Absolute: 729 cells/uL (ref 200–950)
Monocytes Relative: 9 %
Neutro Abs: 3807 cells/uL (ref 1500–7800)
Neutrophils Relative %: 47 %
Platelets: 159 10*3/uL (ref 140–400)
RBC: 4.67 MIL/uL (ref 4.20–5.80)
RDW: 14.2 % (ref 11.0–15.0)
WBC: 8.1 10*3/uL (ref 3.8–10.8)

## 2017-02-18 LAB — BASIC METABOLIC PANEL
BUN: 13 mg/dL (ref 7–25)
CO2: 26 mmol/L (ref 20–31)
Calcium: 9.5 mg/dL (ref 8.6–10.3)
Chloride: 101 mmol/L (ref 98–110)
Creat: 1.16 mg/dL (ref 0.60–1.35)
Glucose, Bld: 100 mg/dL — ABNORMAL HIGH (ref 65–99)
Potassium: 3.9 mmol/L (ref 3.5–5.3)
Sodium: 137 mmol/L (ref 135–146)

## 2017-02-18 LAB — LIPID PANEL
Cholesterol: 206 mg/dL — ABNORMAL HIGH (ref <200)
HDL: 40 mg/dL — ABNORMAL LOW (ref >40)
LDL Cholesterol: 123 mg/dL — ABNORMAL HIGH (ref <100)
Total CHOL/HDL Ratio: 5.2 Ratio — ABNORMAL HIGH (ref <5.0)
Triglycerides: 216 mg/dL — ABNORMAL HIGH (ref <150)
VLDL: 43 mg/dL — ABNORMAL HIGH (ref <30)

## 2017-02-18 LAB — HEPATIC FUNCTION PANEL
ALT: 29 U/L (ref 9–46)
AST: 28 U/L (ref 10–40)
Albumin: 4.3 g/dL (ref 3.6–5.1)
Alkaline Phosphatase: 48 U/L (ref 40–115)
Bilirubin, Direct: 0.1 mg/dL (ref <=0.2)
Indirect Bilirubin: 0.6 mg/dL (ref 0.2–1.2)
Total Bilirubin: 0.7 mg/dL (ref 0.2–1.2)
Total Protein: 7.1 g/dL (ref 6.1–8.1)

## 2017-02-18 LAB — TSH: TSH: 3.41 mIU/L (ref 0.40–4.50)

## 2017-02-18 LAB — GLUCOSE, POCT (MANUAL RESULT ENTRY): POC Glucose: 93 mg/dl (ref 70–99)

## 2017-02-18 LAB — URIC ACID: Uric Acid, Serum: 8.4 mg/dL — ABNORMAL HIGH (ref 4.0–8.0)

## 2017-02-18 MED ORDER — METOPROLOL SUCCINATE ER 50 MG PO TB24: 100.0000 mg | ORAL_TABLET | Freq: Every day | ORAL | 2 refills | Status: DC

## 2017-02-18 MED ORDER — TRIAMTERENE-HCTZ 37.5-25 MG PO TABS: 1.0000 | ORAL_TABLET | Freq: Every day | ORAL | 2 refills | Status: DC

## 2017-02-18 MED ORDER — PANTOPRAZOLE SODIUM 40 MG PO TBEC
40.0000 mg | DELAYED_RELEASE_TABLET | Freq: Every day | ORAL | 1 refills | Status: DC
Start: 2017-02-18 — End: 2017-04-19

## 2017-02-18 NOTE — Progress Notes (Signed)
[name], male   DOB: 1971-05-27, 46 y.o.   MRN: [id number]   Subjective:    [name], male    DOB: June 25, 1971, 46 y.o.   MRN: [id number]  HPI  Patient here for a scheduled follow up.  Has not been seen here by me since 11/24/15.  States he has not been doing as well watching his diet.  Not exercising.  Reports increased acid reflux.  Recently has adjusted his diet and is some better.  Takes ibuprofen.  Instructed to stop.  No swallowing difficulty.  Also reporst some chest pain at night.  Reports has had a few episodes where he feels weak.  Feels like he is going to pass out.  No syncope.  Has occurred on a few occasions.  No known triggers.  No abdominal pain.  Bowels moving, but constipated.  Some rectal bleeding noted.     Past Medical History:  Diagnosis Date  . Allergy   . Chronic back pain   . Hypercholesterolemia   . Hypertension   . Migraines    Past Surgical History:  Procedure Laterality Date  . BACK SURGERY  08/12/14   multiple  . CHOLECYSTECTOMY    . KNEE ARTHROSCOPY     Dr HBK   Family History  Problem Relation Age of Onset  . Cancer Father     germ cell (retroperitoneal)  . Coronary artery disease      s/p stent (age 53)  . Hypertension Paternal Grandfather   . Kidney cancer Paternal Grandfather   . Diabetes Paternal Grandfather   . Diabetes Maternal Grandfather   . Heart disease Cousin     MI - age 55   Social History   Social History  . Marital status: Single    Spouse name: N/A  . Number of children: 2  . Years of education: N/A   Occupational History  .  [employer]   Social History Main Topics  . Smoking status: Never Smoker  . Smokeless tobacco: Never Used  . Alcohol use No  . Drug use: No  . Sexual activity: Not Asked   Other Topics Concern  . None   Social History Narrative  . None    Outpatient Encounter Prescriptions as of 02/18/2017  Medication Sig  . carisoprodol (SOMA) 350 MG tablet  Take 350 mg by mouth 4 (four) times daily as needed for muscle spasms.   . colchicine 0.6 MG tablet Take 1 tablet (0.6 mg total) by mouth 2 (two) times daily.  . fluticasone (FLONASE) 50 MCG/ACT nasal spray Place 2 sprays into both nostrils as needed.  . metoprolol succinate (TOPROL-XL) 50 MG 24 hr tablet Take 2 tablets (100 mg total) by mouth daily. Take with or immediately following a meal.  . Oxycodone HCl 10 MG TABS Take 1 tablet by mouth 3 (three) times daily.  Marland Kitchen triamterene-hydrochlorothiazide (MAXZIDE-25) 37.5-25 MG tablet Take 1 tablet by mouth daily.  . [DISCONTINUED] cephALEXin (KEFLEX) 500 MG capsule Take 1 capsule (500 mg total) by mouth 3 (three) times daily.  . [DISCONTINUED] indomethacin (INDOCIN) 50 MG capsule Reported on 12/11/2015  . [DISCONTINUED] metoprolol succinate (TOPROL-XL) 50 MG 24 hr tablet TAKE 2 TABLETS (100 MG TOTAL) BY MOUTH DAILY. TAKE WITH OR IMMEDIATELY FOLLOWING A MEAL.  . [DISCONTINUED] naproxen (NAPROSYN) 500 MG tablet Take 1 tablet (500 mg total) by mouth 2 (two) times daily with a meal.  . [DISCONTINUED] triamterene-hydrochlorothiazide (MAXZIDE-25) 37.5-25 MG tablet TAKE 1  TABLET BY MOUTH DAILY.  . pantoprazole (PROTONIX) 40 MG tablet Take 1 tablet (40 mg total) by mouth daily.  . [DISCONTINUED] albuterol (PROVENTIL HFA;VENTOLIN HFA) 108 (90 Base) MCG/ACT inhaler Inhale 2 puffs into the lungs every 6 (six) hours as needed for wheezing or shortness of breath. (Patient not taking: Reported on 02/18/2017)  . [DISCONTINUED] benzonatate (TESSALON PERLES) 100 MG capsule Take 1 capsule (100 mg total) by mouth 3 (three) times daily as needed for cough. (Patient not taking: Reported on 02/18/2017)   No facility-administered encounter medications on file as of 02/18/2017.     Review of Systems  Constitutional: Negative for appetite change and unexpected weight change.  HENT: Negative for congestion and sinus pressure.   Respiratory: Negative for cough, chest tightness and  wheezing.   Cardiovascular: Positive for chest pain. Negative for palpitations and leg swelling.  Gastrointestinal: Negative for abdominal pain, diarrhea, nausea and vomiting.       Acid reflux as outlined.    Genitourinary: Negative for difficulty urinating and dysuria.  Musculoskeletal: Positive for back pain. Negative for myalgias.       Chronic back pain.   Skin: Negative for color change and rash.  Neurological: Negative for dizziness, light-headedness and headaches.  Psychiatric/Behavioral: Negative for agitation and dysphoric mood.       Objective:    Physical Exam  Constitutional: He appears well-developed and well-nourished. No distress.  HENT:  Nose: Nose normal.  Mouth/Throat: Oropharynx is clear and moist.  Neck: Neck supple. No thyromegaly present.  Cardiovascular: Normal rate and regular rhythm.   Pulmonary/Chest: Effort normal and breath sounds normal. No respiratory distress.  Abdominal: Soft. Bowel sounds are normal. There is no tenderness.  Musculoskeletal: He exhibits no edema or tenderness.  Lymphadenopathy:    He has no cervical adenopathy.  Skin: No rash noted. No erythema.  Psychiatric: He has a normal mood and affect. His behavior is normal.    BP 120/76 (BP Location: Left Arm, Patient Position: Sitting, Cuff Size: Normal)   Pulse 67   Temp 98.6 F (37 C) (Oral)   Resp 14   Ht 5\' 8"  (1.727 m)   Wt 216 lb 12.8 oz (98.3 kg)   SpO2 96%   BMI 32.96 kg/m  Wt Readings from Last 3 Encounters:  02/18/17 216 lb 12.8 oz (98.3 kg)  12/08/16 213 lb 6.4 oz (96.8 kg)  11/24/15 211 lb 12 oz (96 kg)     Lab Results  Component Value Date   WBC 8.1 02/18/2017   HGB 13.9 02/18/2017   HCT 40.6 02/18/2017   PLT 159 02/18/2017   GLUCOSE 100 (H) 02/18/2017   CHOL 206 (H) 02/18/2017   TRIG 216 (H) 02/18/2017   HDL 40 (L) 02/18/2017   LDLDIRECT 154.0 02/20/2015   LDLCALC 123 (H) 02/18/2017   ALT 29 02/18/2017   AST 28 02/18/2017   NA 137 02/18/2017   K  3.9 02/18/2017   CL 101 02/18/2017   CREATININE 1.16 02/18/2017   BUN 13 02/18/2017   CO2 26 02/18/2017   TSH 3.41 02/18/2017   PSA 0.52 02/20/2015   HGBA1C 6.1 12/09/2016    Dg Lumbar Spine 2-3 Views  Result Date: 04/05/2016 CLINICAL DATA:  Pain left and right lower extremity . EXAM: LUMBAR SPINE - 2-3 VIEW COMPARISON:  CT 08/23/2012. FINDINGS: Surgical clips right upper quadrant . L5 posterior fusion. Right ilial sacral fusion. Hardware intact. Good anatomic bony alignment. Mineralization normal. Degenerative changes lumbar spine and both hips. IMPRESSION: 1.  L5-S1 posterior fusion. Right ileo sacral fusion. Good anatomic alignment. Hardware intact. 2. Degenerative changes lumbar spine and both hips. No acute abnormality. Electronically Signed   By: Marcello Moores  Register   On: 04/05/2016 09:38       Assessment & Plan:   Problem List Items Addressed This Visit    Chest pain - Primary    Chest pain as outlined.  Episodes of feeling weak (intermittently) as outlined.  Unclear etiology.  Treat reflux.  Have him spot check his sugar.  Check when episodes occur.  Gave him a glucometer.  EKG - SR with no acute ischemic changes.  Feel needs further cardiac w/up.  Refer back to cardiology for evaluation.  Unclear etiology of the "spells".  No actual syncope.  Again will check sugar.  May need holter and further stress testing, echo, etc.  If any change or worsening problems, he is to be evaluated.        Relevant Orders   EKG 12-Lead (Completed)   CBC with Differential/Platelet (Completed)   Ambulatory referral to Cardiology   Chronic back pain    Has back surgery 07/2014.  Has been seeing Dr Mauri Pole.  On oxycodone per ortho.        GERD (gastroesophageal reflux disease)    Persistent increased acid reflux as outlined.  Start protonix.  Avoid foods that aggravate.  Avoid eating late.        Relevant Medications   pantoprazole (PROTONIX) 40 MG tablet   Gout    No recent flare.  Check uric acid  level.        Relevant Orders   Uric acid (Completed)   Hypercholesterolemia    Low cholesterol diet and exercise.  Discussed with him today.  Follow lipid panel.       Relevant Medications   metoprolol succinate (TOPROL-XL) 50 MG 24 hr tablet   triamterene-hydrochlorothiazide (MAXZIDE-25) 37.5-25 MG tablet   Other Relevant Orders   Hepatic function panel (Completed)   Lipid panel (Completed)   Hyperglycemia    Low carb diet and exercise.  Weight loss.  Recent a1c 6.0.        Relevant Orders   POCT Glucose (CBG) (Completed)   Hypertension    Blood pressure under good control.  Continue same medication regimen.  Follow pressures.  Follow metabolic panel.        Relevant Medications   metoprolol succinate (TOPROL-XL) 50 MG 24 hr tablet   triamterene-hydrochlorothiazide (MAXZIDE-25) 37.5-25 MG tablet   Other Relevant Orders   TSH (Completed)   Basic metabolic panel (Completed)   Obesity (BMI 30-39.9)    Discussed diet, exercise and weight loss.  Follow.           Einar Pheasant, MD

## 2017-02-18 NOTE — Progress Notes (Signed)
Pre-visit discussion using our clinic review tool. No additional management support is needed unless otherwise documented below in the visit note.  

## 2017-02-19 ENCOUNTER — Encounter: Payer: Self-pay | Admitting: Internal Medicine

## 2017-02-19 DIAGNOSIS — K219 Gastro-esophageal reflux disease without esophagitis: Secondary | ICD-10-CM | POA: Insufficient documentation

## 2017-02-19 NOTE — Assessment & Plan Note (Signed)
Discussed diet, exercise and weight loss.  Follow.    

## 2017-02-19 NOTE — Assessment & Plan Note (Signed)
Has back surgery 07/2014.  Has been seeing Dr Mauri Pole.  On oxycodone per ortho.

## 2017-02-19 NOTE — Assessment & Plan Note (Signed)
Low carb diet and exercise.  Weight loss.  Recent a1c 6.0.

## 2017-02-19 NOTE — Assessment & Plan Note (Signed)
Blood pressure under good control.  Continue same medication regimen.  Follow pressures.  Follow metabolic panel.   

## 2017-02-19 NOTE — Assessment & Plan Note (Signed)
No recent flare.  Check uric acid level.

## 2017-02-19 NOTE — Assessment & Plan Note (Signed)
Persistent increased acid reflux as outlined.  Start protonix.  Avoid foods that aggravate.  Avoid eating late.

## 2017-02-19 NOTE — Assessment & Plan Note (Signed)
Chest pain as outlined.  Episodes of feeling weak (intermittently) as outlined.  Unclear etiology.  Treat reflux.  Have him spot check his sugar.  Check when episodes occur.  Gave him a glucometer.  EKG - SR with no acute ischemic changes.  Feel needs further cardiac w/up.  Refer back to cardiology for evaluation.  Unclear etiology of the "spells".  No actual syncope.  Again will check sugar.  May need holter and further stress testing, echo, etc.  If any change or worsening problems, he is to be evaluated.

## 2017-02-19 NOTE — Assessment & Plan Note (Signed)
Low cholesterol diet and exercise.  Discussed with him today.  Follow lipid panel.

## 2017-02-21 ENCOUNTER — Other Ambulatory Visit: Payer: Self-pay | Admitting: Internal Medicine

## 2017-02-21 NOTE — Telephone Encounter (Signed)
Patient requested a update on this Rx.  Pt contact [phone]

## 2017-02-21 NOTE — Telephone Encounter (Signed)
Pt called to follow up on the medication listed. Refill needed.   Pharmacy is [id number] - [place], [place] [address]  Call pt @ [phone]. Thank you!

## 2017-02-22 ENCOUNTER — Other Ambulatory Visit: Payer: Self-pay | Admitting: Internal Medicine

## 2017-02-22 MED ORDER — ALLOPURINOL 300 MG PO TABS: 300.0000 mg | ORAL_TABLET | Freq: Every day | ORAL | 2 refills | Status: DC

## 2017-02-22 NOTE — Telephone Encounter (Signed)
Pt called back and is getting very aggravated. PT states that he is getting a gout flair up (in big toe) and needs this medication. Please advise, thank you!  Call pt @ [phone]

## 2017-02-22 NOTE — Telephone Encounter (Signed)
Also has message under lab results

## 2017-02-22 NOTE — Progress Notes (Signed)
rx sent in for allopurinol #30 with 2 refills.

## 2017-02-23 NOTE — Telephone Encounter (Signed)
rx sent in for colchicine.  Notify pt since having an acute flare, to start colchicine now.  Hold on starting allopurinol until after gout flare resolves.  Once gout flare resolves, then stop colchicine and start allopurinol to prevent gout flare.

## 2017-03-02 NOTE — Telephone Encounter (Signed)
Not able to leave vm

## 2017-03-07 NOTE — Telephone Encounter (Signed)
Not able to leave vm

## 2017-03-17 NOTE — Telephone Encounter (Signed)
Not able to l/m.

## 2017-03-24 NOTE — Telephone Encounter (Signed)
Have not be able to reach patient do you want me mail letter to have him call office?

## 2017-03-25 NOTE — Telephone Encounter (Signed)
You can send letter.  May be past the acute flare.  Will need him to call office with update on condition to know which medication is needed now.

## 2017-04-19 ENCOUNTER — Other Ambulatory Visit: Payer: Self-pay | Admitting: Internal Medicine

## 2017-05-18 ENCOUNTER — Other Ambulatory Visit: Payer: Self-pay | Admitting: Internal Medicine

## 2017-05-23 ENCOUNTER — Ambulatory Visit: Payer: Self-pay | Admitting: Internal Medicine

## 2017-07-01 ENCOUNTER — Telehealth: Payer: Self-pay | Admitting: Internal Medicine

## 2017-07-01 ENCOUNTER — Encounter: Payer: Self-pay | Admitting: Emergency Medicine

## 2017-07-01 ENCOUNTER — Emergency Department: Payer: Self-pay

## 2017-07-01 ENCOUNTER — Telehealth (INDEPENDENT_AMBULATORY_CARE_PROVIDER_SITE_OTHER): Payer: Self-pay | Admitting: Internal Medicine

## 2017-07-01 ENCOUNTER — Emergency Department
Admission: EM | Admit: 2017-07-01 | Discharge: 2017-07-01 | Disposition: A | Discharge status: Home / Self Care | Payer: Self-pay | Attending: Emergency Medicine | Admitting: Emergency Medicine

## 2017-07-01 DIAGNOSIS — R55 Syncope and collapse: Secondary | ICD-10-CM

## 2017-07-01 DIAGNOSIS — Z79899 Other long term (current) drug therapy: Secondary | ICD-10-CM | POA: Insufficient documentation

## 2017-07-01 DIAGNOSIS — I200 Unstable angina: Secondary | ICD-10-CM

## 2017-07-01 DIAGNOSIS — I10 Essential (primary) hypertension: Secondary | ICD-10-CM | POA: Insufficient documentation

## 2017-07-01 DIAGNOSIS — R079 Chest pain, unspecified: Secondary | ICD-10-CM

## 2017-07-01 LAB — GLUCOSE, POCT (MANUAL RESULT ENTRY): POC Glucose: 111 mg/dl — AB (ref 70–99)

## 2017-07-01 LAB — CBC
HCT: 44.6 % (ref 40.0–52.0)
Hemoglobin: 15.1 g/dL (ref 13.0–18.0)
MCH: 29.5 pg (ref 26.0–34.0)
MCHC: 33.9 g/dL (ref 32.0–36.0)
MCV: 87.2 fL (ref 80.0–100.0)
Platelets: 193 10*3/uL (ref 150–440)
RBC: 5.11 MIL/uL (ref 4.40–5.90)
RDW: 14.1 % (ref 11.5–14.5)
WBC: 10.4 10*3/uL (ref 3.8–10.6)

## 2017-07-01 LAB — BASIC METABOLIC PANEL
Anion gap: 9 (ref 5–15)
BUN: 11 mg/dL (ref 6–20)
CO2: 25 mmol/L (ref 22–32)
Calcium: 9.8 mg/dL (ref 8.9–10.3)
Chloride: 100 mmol/L — ABNORMAL LOW (ref 101–111)
Creatinine, Ser: 1.01 mg/dL (ref 0.61–1.24)
GFR calc Af Amer: >60 mL/min (ref >60)
GFR calc non Af Amer: >60 mL/min (ref >60)
Glucose, Bld: 103 mg/dL — ABNORMAL HIGH (ref 65–99)
Potassium: 4.1 mmol/L (ref 3.5–5.1)
Sodium: 134 mmol/L — ABNORMAL LOW (ref 135–145)

## 2017-07-01 LAB — TROPONIN I: Troponin I: <0.03 ng/mL (ref <0.03)

## 2017-07-01 MED ORDER — LORAZEPAM 0.5 MG PO TABS: 0.5000 mg | ORAL_TABLET | Freq: Three times a day (TID) | ORAL | 0 refills | Status: DC | PRN

## 2017-07-01 NOTE — Telephone Encounter (Signed)
Pt called and stated that he thinks that his blood sugar issues. He states that he has being feeling more weak,chest pain (like when you run), he took his blood sugar was 206 ad blood pressure was 144/94. He states that his the top of his head hurt. Pt states that his blood sugar is 119 now and blood pressure is 136/83. Sent call to [facility] triage.  Call pt @ [phone]

## 2017-07-01 NOTE — Telephone Encounter (Signed)
See 07/01/17 phone note; Dr Nicki Reaper at [facility]; will fwd note to Dr Nicki Reaper and Garnette Scheuermann LPN.

## 2017-07-01 NOTE — Telephone Encounter (Signed)
Spoke with patient again and he stated that he was feeling some better but agreed to go to walk in clinic or the ED to be evaluated today.

## 2017-07-01 NOTE — Telephone Encounter (Signed)
EKG in the office revealed SR with no acute ischemic changes.  Pt was triaged to ER.  Went to ER for evaluation.  Planning for f/u with cardiology after ER evaluation.

## 2017-07-01 NOTE — Telephone Encounter (Signed)
FYI- See team health note.

## 2017-07-01 NOTE — Telephone Encounter (Signed)
Patient Name: Cameron Sanchez  DOB: Apr 18, 1971    Initial Comment Caller states he feels weak, chest pain.   Nurse Assessment  Nurse: Julien Girt, RN, Almyra Free Date/Time Eilene Ghazi Time): 07/01/2017 8:48:11 AM  Confirm and document reason for call. If symptomatic, describe symptoms. ---Caller states he is having chest pain and is feeling weak. States he has had this pain before, "spells" always feels very weak, and was referred to a cardiologist but missed the appointment. He had 2 episodes on Thursday, last 25 -30 mins.  Does the patient have any new or worsening symptoms? ---Yes  Will a triage be completed? ---Yes  Related visit to physician within the last 2 weeks? ---No  Does the PT have any chronic conditions? (i.e. diabetes, asthma, etc.) ---Yes  List chronic conditions. ---Hx chest pain, eud, Back surgery x 4 , Htn, GOUT, Pre-diabetic  Is this a behavioral health or substance abuse call? ---No     Guidelines    Guideline Title Affirmed Question Affirmed Notes  Chest Pain [1] Chest pain lasts > 5 minutes AND [2] age > 54 AND [3] at least one cardiac risk factor (i.e., hypertension, diabetes, obesity, smoker or strong family history of heart disease)    Final Disposition User   Call EMS 911 Now Julien Girt, RN, Almyra Free    Disagree/Comply: Comply   911 Outcome Documentation Julien Girt, RN, Almyra Free Reason: Obtained message that voice mail box is not set up yet. Number was confirmed, Girlfriend was present

## 2017-07-01 NOTE — Telephone Encounter (Signed)
EMS is with patient at this time. Will follow up.

## 2017-07-01 NOTE — Telephone Encounter (Signed)
Patient came into clinc with C/O intermittent chest pain, fatigue ,  Feeling sweaty and Hot spells and high sugar, checked cbg = 111 temp 98.2 02 sat @ 98 pulse 69 BP 118/78, patient had called EMS earlier in the day and refused ED , then this office had advised twice to go to ED patient still came into office , patient was triaged and sent to ED for further work up. Called ED and advised of patient coming from office for further evaluation for labs that would take several hours to return here in office. Patient agreed to go to ED immediately from PCP office , advise should call and have someone drive patient refused.

## 2017-07-01 NOTE — Discharge Instructions (Signed)
You have been seen in the emergency department today for chest pain and episodes of near syncope. Your workup has shown normal results. As we discussed please follow-up with cardiology as soon as possible by calling the number provided Monday morning to arrange a Holter monitor. Return to the emergency department for any further chest pain, trouble breathing, or any other symptom personally concerning to yourself.

## 2017-07-01 NOTE — Telephone Encounter (Signed)
Per other phone note, EMS with pt.  Do not see where he went to ER.  Agree needs to be seen.

## 2017-07-01 NOTE — ED Triage Notes (Signed)
Pt arrives with complaints of chest pain, mid chest sharp in nature, began last night, states "weak spells" where his legs feel like giving out, awake and alert, states SOB with chest pain

## 2017-07-01 NOTE — ED Triage Notes (Signed)
First Nurse Note:  Sent from D.r Scott's office for ED evaluation.  Patient c/o hyperglycemia dn chest pain.  CBG at Dr. Bary Leriche office was 111 and EKG was "wnl, no change from prior EKG".  Patient AAOx3.  Skin warm and dry.  Vital signs at Dr. Bary Leriche office were BP:  118/78  P:  69  SPO@ 98.

## 2017-07-01 NOTE — ED Provider Notes (Signed)
[facility] Emergency Department Provider Note  Time seen: 7:48 PM  I have reviewed the triage vital signs and the nursing notes.   HISTORY  Chief Complaint Chest Pain    HPI Cameron Sanchez is a 46 y.o. male with a past medical history of hypertension, hyperlipidemia, presents to the emergency department for chest discomfort and lightheadedness. According to the patient for the past one year he has been experiencing episodes intermittently of feeling like he is going to pass out with generalized weakness. States his symptoms will happen sporadically and last for 30 minutes to one hour before resolving. He states over the past 2 weeks they have been occurring more frequently. He also states over the past 3-4 weeks she has been experiencing intermittent central chest discomfort. States intermittent nausea denies shortness of breath but does state intermittent diaphoresis. Patient has seen his doctor for the same and was referred to the ER for further workup.  Past Medical History:  Diagnosis Date  . Allergy   . Chronic back pain   . Hypercholesterolemia   . Hypertension   . Migraines     Patient Active Problem List   Diagnosis Date Noted  . GERD (gastroesophageal reflux disease) 02/19/2017  . Chest pain 02/18/2017  . Gout 02/18/2017  . Hypoglycemia 12/08/2016  . Left knee pain 11/24/2015  . Bronchitis 07/23/2015  . Foot pain, left 12/30/2014  . Obesity (BMI 30-39.9) 12/21/2014  . Hyperglycemia 12/21/2014  . Sinusitis 02/03/2014  . Knee pain 12/16/2013  . Stress 12/16/2013  . Ankle pain 12/16/2013  . Chronic back pain 10/31/2012  . Hypertension 10/31/2012  . Hypercholesterolemia 10/31/2012    Past Surgical History:  Procedure Laterality Date  . BACK SURGERY  08/12/14   multiple  . CHOLECYSTECTOMY    . KNEE ARTHROSCOPY     Dr HBK    Prior to Admission medications   Medication Sig Start Date End Date Taking? Authorizing Provider  allopurinol  (ZYLOPRIM) 300 MG tablet TAKE 1 TABLET BY MOUTH EVERY DAY 05/19/17   Einar Pheasant, MD  carisoprodol (SOMA) 350 MG tablet Take 350 mg by mouth 4 (four) times daily as needed for muscle spasms.     [provider]  colchicine 0.6 MG tablet TAKE 1 TABLET (0.6 MG TOTAL) BY MOUTH 2 (TWO) TIMES DAILY. 02/23/17   Einar Pheasant, MD  fluticasone (FLONASE) 50 MCG/ACT nasal spray Place 2 sprays into both nostrils as needed. 10/24/14   [provider]  metoprolol succinate (TOPROL-XL) 50 MG 24 hr tablet TAKE 2 TABLETS (100 MG TOTAL) BY MOUTH DAILY. TAKE WITH OR IMMEDIATELY FOLLOWING A MEAL. 05/19/17   Einar Pheasant, MD  Oxycodone HCl 10 MG TABS Take 1 tablet by mouth 3 (three) times daily. 11/24/14   [provider]  pantoprazole (PROTONIX) 40 MG tablet TAKE 1 TABLET (40 MG TOTAL) BY MOUTH DAILY. 04/19/17   Einar Pheasant, MD  triamterene-hydrochlorothiazide (MAXZIDE-25) 37.5-25 MG tablet TAKE 1 TABLET BY MOUTH DAILY. 05/19/17   Einar Pheasant, MD    No Known Allergies  Family History  Problem Relation Age of Onset  . Cancer Father        germ cell (retroperitoneal)  . Coronary artery disease Unknown        s/p stent (age 68)  . Hypertension Paternal Grandfather   . Kidney cancer Paternal Grandfather   . Diabetes Paternal Grandfather   . Diabetes Maternal Grandfather   . Heart disease Cousin  MI - age 26    Social History Social History  Substance Use Topics  . Smoking status: Never Smoker  . Smokeless tobacco: Never Used  . Alcohol use No    Review of Systems Constitutional: Negative for fever. Cardiovascular: Intermittent chest pain over the past several weeks Respiratory: Negative for shortness of breath. Gastrointestinal: Negative for abdominal pain, vomiting and diarrhea. Musculoskeletal: Negative for leg pain or swelling Neurological: Negative for headache All other ROS negative  ____________________________________________   PHYSICAL  EXAM:  VITAL SIGNS: ED Triage Vitals  Enc Vitals Group     BP 07/01/17 1635 132/74     Pulse Rate 07/01/17 1635 65     Resp 07/01/17 1635 18     Temp 07/01/17 1635 98.8 F (37.1 C)     Temp Source 07/01/17 1635 Oral     SpO2 07/01/17 1635 98 %     Weight 07/01/17 1635 212 lb (96.2 kg)     Height 07/01/17 1635 5\' 8"  (1.727 m)     Head Circumference --      Peak Flow --      Pain Score 07/01/17 1634 4     Pain Loc --      Pain Edu? --      Excl. in [place]? --     Constitutional: Alert and oriented. Well appearing and in no distress. Eyes: Normal exam ENT   Head: Normocephalic and atraumatic   Mouth/Throat: Mucous membranes are moist. Cardiovascular: Normal rate, regular rhythm. No murmur Respiratory: Normal respiratory effort without tachypnea nor retractions. Breath sounds are clear Gastrointestinal: Soft and nontender. No distention.   Musculoskeletal: Nontender with normal range of motion in all extremities. No lower extremity tenderness or edema. Neurologic:  Normal speech and language. No gross focal neurologic deficits  Skin:  Skin is warm, dry and intact.  Psychiatric: Mood and affect are normal.   ____________________________________________    EKG  EKG reviewed and interpreted by myself shows normal sinus rhythm at 65 bpm, narrow QRS, normal axis, normal intervals, no ST changes. Normal EKG  ____________________________________________    RADIOLOGY  Chest x-ray negative  ____________________________________________   INITIAL IMPRESSION / ASSESSMENT AND PLAN / ED COURSE  Pertinent labs & imaging results that were available during my care of the patient were reviewed by me and considered in my medical decision making (see chart for details).  Patient presents the emergency department for intermittent chest pain and near syncopal episodes. Overall the patient appears very well with a normal physical exam. No complaints at this time. Patient's workup  including cardiac enzymes are normal. EKG is normal, chest x-ray is normal. I discussed with the patient the possibilities of an arrhythmia causing intermittent near syncope also the possibility of anxiety. Patient will follow-up with cardiology on Monday for a Holter monitor. We will also discharge with a short course of anxiolytic medication for trial at home. Patient agreeable to plan. Discussed my normal chest pain return precautions.  ____________________________________________   FINAL CLINICAL IMPRESSION(S) / ED DIAGNOSES  Chest pain Near-syncope    Harvest Dark, MD 07/01/17 [phone]

## 2017-07-25 ENCOUNTER — Ambulatory Visit: Payer: Self-pay | Admitting: Internal Medicine

## 2017-07-25 DIAGNOSIS — Z0289 Encounter for other administrative examinations: Secondary | ICD-10-CM

## 2017-08-17 ENCOUNTER — Other Ambulatory Visit: Payer: Self-pay | Admitting: Internal Medicine

## 2017-11-21 ENCOUNTER — Other Ambulatory Visit: Payer: Self-pay | Admitting: Internal Medicine

## 2017-12-22 ENCOUNTER — Other Ambulatory Visit: Payer: Self-pay | Admitting: Internal Medicine

## 2017-12-22 NOTE — Telephone Encounter (Signed)
Copied from [place]. Topic: Quick Communication - Rx Refill/Question >> Dec 22, 2017 11:44 AM Scherrie Gerlach wrote: Medication: triamterene-hydrochlorothiazide (MAXZIDE-25) 37.5-25 MG tablet metoprolol succinate (TOPROL-XL) 50 MG 24 hr tablet   Has the patient contacted their pharmacy? {yes Pt was told he needed an appt. Pt has made an appt for refills, but is out. Can you send enough to get him through to 12/27/17?  [id number] - [place], [place] - [address] [phone] (Phone) [phone] (Fax)

## 2017-12-27 ENCOUNTER — Ambulatory Visit (INDEPENDENT_AMBULATORY_CARE_PROVIDER_SITE_OTHER): Payer: Self-pay | Admitting: Internal Medicine

## 2017-12-27 ENCOUNTER — Encounter: Payer: Self-pay | Admitting: Internal Medicine

## 2017-12-27 VITALS — BP 128/84 | HR 64 | Temp 98.3°F | Resp 20 | Wt 221.6 lb

## 2017-12-27 DIAGNOSIS — F439 Reaction to severe stress, unspecified: Secondary | ICD-10-CM

## 2017-12-27 DIAGNOSIS — M545 Low back pain: Secondary | ICD-10-CM

## 2017-12-27 DIAGNOSIS — K219 Gastro-esophageal reflux disease without esophagitis: Secondary | ICD-10-CM

## 2017-12-27 DIAGNOSIS — E7800 Pure hypercholesterolemia, unspecified: Secondary | ICD-10-CM

## 2017-12-27 DIAGNOSIS — G8929 Other chronic pain: Secondary | ICD-10-CM

## 2017-12-27 DIAGNOSIS — I10 Essential (primary) hypertension: Secondary | ICD-10-CM

## 2017-12-27 DIAGNOSIS — E669 Obesity, unspecified: Secondary | ICD-10-CM

## 2017-12-27 DIAGNOSIS — M109 Gout, unspecified: Secondary | ICD-10-CM

## 2017-12-27 DIAGNOSIS — R739 Hyperglycemia, unspecified: Secondary | ICD-10-CM

## 2017-12-27 DIAGNOSIS — M79672 Pain in left foot: Secondary | ICD-10-CM

## 2017-12-27 DIAGNOSIS — Z125 Encounter for screening for malignant neoplasm of prostate: Secondary | ICD-10-CM

## 2017-12-27 MED ORDER — PANTOPRAZOLE SODIUM 40 MG PO TBEC: 40.0000 mg | DELAYED_RELEASE_TABLET | Freq: Every day | ORAL | 1 refills | Status: DC

## 2017-12-27 NOTE — Progress Notes (Addendum)
[name], male   DOB: 11-07-1971, 47 y.o.   MRN: [id number]   Subjective:    [name], male    DOB: 09/05/1971, 47 y.o.   MRN: [id number]  HPI  Patient here for a scheduled follow up.  He is accompanied by his friend.  History obtained from both of them.  He was evaluated 07/01/17 (ER) - for chest pain.  Had f/u with cardiology - Dr Chancy Milroy.  Reports had negative stress test and negative cardiac w/up.  Reports having increased acid reflux now.  Taking TUMS.  Breathing stable.  No reports of increased heart rate or palpitations.  No abdominal pain.  Bowels moving.  Has some issues with constipation.  Feel aggravated by his pain medication.  States having gout flare. Taking colchicine.  Is better.  Discussed diet and exercise.  Discussed the need to keep regular appointments.     Past Medical History:  Diagnosis Date  . Allergy   . Chronic back pain   . Hypercholesterolemia   . Hypertension   . Migraines    Past Surgical History:  Procedure Laterality Date  . BACK SURGERY  08/12/14   multiple  . CHOLECYSTECTOMY    . KNEE ARTHROSCOPY     Dr HBK   Family History  Problem Relation Age of Onset  . Cancer Father        germ cell (retroperitoneal)  . Coronary artery disease Unknown        s/p stent (age 81)  . Hypertension Paternal Grandfather   . Kidney cancer Paternal Grandfather   . Diabetes Paternal Grandfather   . Diabetes Maternal Grandfather   . Heart disease Cousin        MI - age 78   Social History   Socioeconomic History  . Marital status: Single    Spouse name: None  . Number of children: 2  . Years of education: None  . Highest education level: None  Social Needs  . Financial resource strain: None  . Food insecurity - worry: None  . Food insecurity - inability: None  . Transportation needs - medical: None  . Transportation needs - non-medical: None  Occupational History    Employer: [employer]  Tobacco Use  . Smoking  status: Never Smoker  . Smokeless tobacco: Never Used  Substance and Sexual Activity  . Alcohol use: No    Alcohol/week: 0.0 oz  . Drug use: No  . Sexual activity: None  Other Topics Concern  . None  Social History Narrative  . None    Outpatient Encounter Medications as of 12/27/2017  Medication Sig  . allopurinol (ZYLOPRIM) 300 MG tablet TAKE 1 TABLET BY MOUTH EVERY DAY  . carisoprodol (SOMA) 350 MG tablet Take 350 mg by mouth 4 (four) times daily as needed for muscle spasms.   . colchicine 0.6 MG tablet TAKE 1 TABLET (0.6 MG TOTAL) BY MOUTH 2 (TWO) TIMES DAILY.  . fluticasone (FLONASE) 50 MCG/ACT nasal spray Place 2 sprays into both nostrils as needed.  . metoprolol succinate (TOPROL-XL) 50 MG 24 hr tablet Take 2 tablets (100 mg total) by mouth daily. Take with or immediately following a meal.  . Oxycodone HCl 10 MG TABS Take 1 tablet by mouth 3 (three) times daily.  . pantoprazole (PROTONIX) 40 MG tablet Take 1 tablet (40 mg total) by mouth daily.  Marland Kitchen triamterene-hydrochlorothiazide (MAXZIDE-25) 37.5-25 MG tablet TAKE 1 TABLET BY MOUTH DAILY.  Marland Kitchen triamterene-hydrochlorothiazide (MAXZIDE-25)  37.5-25 MG tablet TAKE 1 TABLET BY MOUTH DAILY.  . [DISCONTINUED] LORazepam (ATIVAN) 0.5 MG tablet Take 1 tablet (0.5 mg total) by mouth every 8 (eight) hours as needed for anxiety.  . [DISCONTINUED] metoprolol succinate (TOPROL-XL) 50 MG 24 hr tablet TAKE 2 TABLETS (100 MG TOTAL) BY MOUTH DAILY. TAKE WITH OR IMMEDIATELY FOLLOWING A MEAL.  . [DISCONTINUED] metoprolol succinate (TOPROL-XL) 50 MG 24 hr tablet TAKE 2 TABLETS (100 MG TOTAL) BY MOUTH DAILY. TAKE WITH OR IMMEDIATELY FOLLOWING A MEAL.  . [DISCONTINUED] pantoprazole (PROTONIX) 40 MG tablet TAKE 1 TABLET (40 MG TOTAL) BY MOUTH DAILY.  . [DISCONTINUED] pantoprazole (PROTONIX) 40 MG tablet Take 1 tablet (40 mg total) by mouth daily.   No facility-administered encounter medications on file as of 12/27/2017.     Review of Systems    Constitutional: Negative for appetite change.       Not watching his diet.  Has gained weight.    HENT: Negative for congestion and sinus pressure.   Respiratory: Negative for cough, chest tightness and shortness of breath.   Cardiovascular: Negative for chest pain, palpitations and leg swelling.  Gastrointestinal: Positive for constipation. Negative for abdominal pain, diarrhea and vomiting.  Genitourinary: Negative for difficulty urinating and dysuria.  Musculoskeletal: Positive for back pain. Negative for joint swelling.       Foot pain as outlined.  Relates to gout flare.   Skin: Negative for color change and rash.  Neurological: Negative for dizziness, light-headedness and headaches.  Psychiatric/Behavioral: Negative for agitation and dysphoric mood.       Objective:     Blood pressure rechecked by me:  128/84  Physical Exam  Constitutional: He appears well-developed and well-nourished. No distress.  HENT:  Nose: Nose normal.  Mouth/Throat: Oropharynx is clear and moist.  Neck: Neck supple. No thyromegaly present.  Cardiovascular: Normal rate and regular rhythm.  Pulmonary/Chest: Effort normal and breath sounds normal. No respiratory distress.  Abdominal: Soft. Bowel sounds are normal. There is no tenderness.  Musculoskeletal: He exhibits no edema or tenderness.  Tenderness noted over base of great toe.    Lymphadenopathy:    He has no cervical adenopathy.  Skin: No rash noted. No erythema.  Psychiatric: He has a normal mood and affect. His behavior is normal.    BP 128/84   Pulse 64   Temp 98.3 F (36.8 C) (Oral)   Resp 20   Wt 221 lb 9.6 oz (100.5 kg)   SpO2 98%   BMI 33.69 kg/m  Wt Readings from Last 3 Encounters:  12/27/17 221 lb 9.6 oz (100.5 kg)  07/01/17 212 lb (96.2 kg)  02/18/17 216 lb 12.8 oz (98.3 kg)     Lab Results  Component Value Date   WBC 10.4 07/01/2017   HGB 15.1 07/01/2017   HCT 44.6 07/01/2017   PLT 193 07/01/2017   GLUCOSE 103 (H)  07/01/2017   CHOL 206 (H) 02/18/2017   TRIG 216 (H) 02/18/2017   HDL 40 (L) 02/18/2017   LDLDIRECT 154.0 02/20/2015   LDLCALC 123 (H) 02/18/2017   ALT 29 02/18/2017   AST 28 02/18/2017   NA 134 (L) 07/01/2017   K 4.1 07/01/2017   CL 100 (L) 07/01/2017   CREATININE 1.01 07/01/2017   BUN 11 07/01/2017   CO2 25 07/01/2017   TSH 3.41 02/18/2017   PSA 0.52 02/20/2015   HGBA1C 6.1 12/09/2016    Dg Chest 2 View  Result Date: 07/01/2017 CLINICAL DATA:  Chest pain. EXAM: CHEST  2 VIEW COMPARISON:  Radiographs of February 26, 2011. FINDINGS: The heart size and mediastinal contours are within normal limits. Both lungs are clear. No pneumothorax or pleural effusion is noted. The visualized skeletal structures are unremarkable. IMPRESSION: No active cardiopulmonary disease. Electronically Signed   By: Marijo Conception, M.D.   On: 07/01/2017 17:10       Assessment & Plan:   Problem List Items Addressed This Visit    Chronic back pain    Had back surgery 07/2014.  Seeing Dr Mauri Pole.  On oxycodone (per ortho).  Undergoing evaluation for disability.        Foot pain, left    Recent gout flare.  On colchicine.  Better.       GERD (gastroesophageal reflux disease)    Persistent increased acid reflux.  Start protonix.  Given the severity, will refer to GI for evaluation.        Relevant Medications   pantoprazole (PROTONIX) 40 MG tablet   Other Relevant Orders   Ambulatory referral to Gastroenterology   Gout    On colchicine now.  Foot is better.  Follow.  Will resume allopurinol after this flare.  Desires not to stop the triam/hctz.  Can decrease to 1/2 tablet.  Follow pressures.        Hypercholesterolemia    Not watching his diet.  Discussed the need for diet and exercise.  Diet information given.  Follow lipid panel.        Relevant Medications   metoprolol succinate (TOPROL-XL) 50 MG 24 hr tablet   Other Relevant Orders   Hepatic function panel   Lipid panel   Hyperglycemia     Low carb diet and exercise.  Follow met b and a1c.        Relevant Orders   Hemoglobin A1c   Hypertension    Blood pressure under good control.  Continue same medication regimen.  Follow pressures.  Follow metabolic panel.        Relevant Medications   metoprolol succinate (TOPROL-XL) 50 MG 24 hr tablet   Other Relevant Orders   TSH   Basic metabolic panel   Obesity (BMI 30-39.9)    Discussed diet and exercise.  Follow.       Stress    Overall appears to be doing better.  With his friend now.  Follow.        Other Visit Diagnoses    Prostate cancer screening    -  Primary   Relevant Orders   PSA       Einar Pheasant, MD

## 2017-12-30 ENCOUNTER — Encounter: Payer: Self-pay | Admitting: Internal Medicine

## 2017-12-30 MED ORDER — METOPROLOL SUCCINATE ER 50 MG PO TB24: 100.0000 mg | ORAL_TABLET | Freq: Every day | ORAL | 2 refills | Status: DC

## 2017-12-30 NOTE — Addendum Note (Signed)
Addended by: Alisa Graff on: 12/30/2017 05:02 AM   Modules accepted: Orders

## 2017-12-30 NOTE — Assessment & Plan Note (Signed)
Had back surgery 07/2014.  Seeing Dr Mauri Pole.  On oxycodone (per ortho).  Undergoing evaluation for disability.

## 2017-12-30 NOTE — Assessment & Plan Note (Signed)
Overall appears to be doing better.  With his friend now.  Follow.

## 2017-12-30 NOTE — Assessment & Plan Note (Signed)
Persistent increased acid reflux.  Start protonix.  Given the severity, will refer to GI for evaluation.

## 2017-12-30 NOTE — Assessment & Plan Note (Signed)
Not watching his diet.  Discussed the need for diet and exercise.  Diet information given.  Follow lipid panel.

## 2017-12-30 NOTE — Assessment & Plan Note (Signed)
Low carb diet and exercise.  Follow met b and a1c.   

## 2017-12-30 NOTE — Assessment & Plan Note (Addendum)
On colchicine now.  Foot is better.  Follow.  Will resume allopurinol after this flare.  Desires not to stop the triam/hctz.  Can decrease to 1/2 tablet.  Follow pressures.

## 2017-12-30 NOTE — Assessment & Plan Note (Signed)
Recent gout flare.  On colchicine.  Better.

## 2017-12-30 NOTE — Assessment & Plan Note (Signed)
Blood pressure under good control.  Continue same medication regimen.  Follow pressures.  Follow metabolic panel.   

## 2017-12-30 NOTE — Assessment & Plan Note (Signed)
Discussed diet and exercise.  Follow.  

## 2018-01-04 ENCOUNTER — Other Ambulatory Visit (INDEPENDENT_AMBULATORY_CARE_PROVIDER_SITE_OTHER): Payer: Self-pay

## 2018-01-04 DIAGNOSIS — E7800 Pure hypercholesterolemia, unspecified: Secondary | ICD-10-CM

## 2018-01-04 DIAGNOSIS — I10 Essential (primary) hypertension: Secondary | ICD-10-CM

## 2018-01-04 DIAGNOSIS — R739 Hyperglycemia, unspecified: Secondary | ICD-10-CM

## 2018-01-04 DIAGNOSIS — Z125 Encounter for screening for malignant neoplasm of prostate: Secondary | ICD-10-CM

## 2018-01-04 LAB — HEPATIC FUNCTION PANEL
ALT: 59 U/L — ABNORMAL HIGH (ref 0–53)
AST: 48 U/L — ABNORMAL HIGH (ref 0–37)
Albumin: 4.4 g/dL (ref 3.5–5.2)
Alkaline Phosphatase: 62 U/L (ref 39–117)
Bilirubin, Direct: 0.1 mg/dL (ref 0.0–0.3)
Total Bilirubin: 1 mg/dL (ref 0.2–1.2)
Total Protein: 7.1 g/dL (ref 6.0–8.3)

## 2018-01-04 LAB — BASIC METABOLIC PANEL
BUN: 12 mg/dL (ref 6–23)
CO2: 31 mEq/L (ref 19–32)
Calcium: 9.9 mg/dL (ref 8.4–10.5)
Chloride: 98 mEq/L (ref 96–112)
Creatinine, Ser: 0.97 mg/dL (ref 0.40–1.50)
GFR: 87.55 mL/min (ref >60.00)
Glucose, Bld: 126 mg/dL — ABNORMAL HIGH (ref 70–99)
Potassium: 4.6 mEq/L (ref 3.5–5.1)
Sodium: 136 mEq/L (ref 135–145)

## 2018-01-04 LAB — LIPID PANEL
Cholesterol: 197 mg/dL (ref 0–200)
HDL: 36.9 mg/dL — ABNORMAL LOW (ref >39.00)
NonHDL: 159.6
Total CHOL/HDL Ratio: 5
Triglycerides: 233 mg/dL — ABNORMAL HIGH (ref 0.0–149.0)
VLDL: 46.6 mg/dL — ABNORMAL HIGH (ref 0.0–40.0)

## 2018-01-04 LAB — TSH: TSH: 3.59 u[IU]/mL (ref 0.35–4.50)

## 2018-01-04 LAB — HEMOGLOBIN A1C: Hgb A1c MFr Bld: 6.3 % (ref 4.6–6.5)

## 2018-01-04 LAB — LDL CHOLESTEROL, DIRECT: Direct LDL: 134 mg/dL

## 2018-01-04 LAB — PSA: PSA: 0.43 ng/mL (ref 0.10–4.00)

## 2018-01-05 ENCOUNTER — Other Ambulatory Visit: Payer: Self-pay | Admitting: Internal Medicine

## 2018-01-05 DIAGNOSIS — R945 Abnormal results of liver function studies: Secondary | ICD-10-CM

## 2018-01-05 DIAGNOSIS — R7989 Other specified abnormal findings of blood chemistry: Secondary | ICD-10-CM

## 2018-01-05 NOTE — Progress Notes (Signed)
Order placed for f/u liver panel.  

## 2018-01-09 ENCOUNTER — Other Ambulatory Visit: Payer: Self-pay | Admitting: Internal Medicine

## 2018-01-09 DIAGNOSIS — R945 Abnormal results of liver function studies: Secondary | ICD-10-CM

## 2018-01-09 DIAGNOSIS — R7989 Other specified abnormal findings of blood chemistry: Secondary | ICD-10-CM

## 2018-01-09 DIAGNOSIS — R739 Hyperglycemia, unspecified: Secondary | ICD-10-CM

## 2018-01-09 NOTE — Progress Notes (Signed)
Order placed for [facility] referral and abdominal ultrasound.

## 2018-01-13 ENCOUNTER — Ambulatory Visit: Payer: Self-pay

## 2018-01-16 ENCOUNTER — Ambulatory Visit
Admission: RE | Admit: 2018-01-16 | Discharge: 2018-01-16 | Disposition: A | Discharge status: Home / Self Care | Payer: Self-pay | Source: Ambulatory Visit | Attending: Internal Medicine | Admitting: Internal Medicine

## 2018-01-16 DIAGNOSIS — R7989 Other specified abnormal findings of blood chemistry: Secondary | ICD-10-CM

## 2018-01-16 DIAGNOSIS — R945 Abnormal results of liver function studies: Secondary | ICD-10-CM | POA: Insufficient documentation

## 2018-01-17 ENCOUNTER — Telehealth: Payer: Self-pay | Admitting: Internal Medicine

## 2018-01-17 ENCOUNTER — Other Ambulatory Visit: Payer: Self-pay | Admitting: Internal Medicine

## 2018-01-17 DIAGNOSIS — R200 Anesthesia of skin: Secondary | ICD-10-CM

## 2018-01-17 DIAGNOSIS — R7989 Other specified abnormal findings of blood chemistry: Secondary | ICD-10-CM

## 2018-01-17 DIAGNOSIS — R945 Abnormal results of liver function studies: Secondary | ICD-10-CM

## 2018-01-17 DIAGNOSIS — K760 Fatty (change of) liver, not elsewhere classified: Secondary | ICD-10-CM

## 2018-01-17 MED ORDER — COLCHICINE 0.6 MG PO TABS: 0.6000 mg | ORAL_TABLET | Freq: Two times a day (BID) | ORAL | 0 refills | Status: DC

## 2018-01-17 NOTE — Progress Notes (Signed)
Order placed for GI referral.   

## 2018-01-17 NOTE — Telephone Encounter (Signed)
See last unrouted msg 

## 2018-01-17 NOTE — Telephone Encounter (Signed)
Sent rx for colchicine to [facility].

## 2018-01-17 NOTE — Telephone Encounter (Signed)
Patient aware.

## 2018-01-17 NOTE — Telephone Encounter (Signed)
OK to refill

## 2018-01-17 NOTE — Telephone Encounter (Signed)
CRM for notification. See Telephone encounter for: pt called in to request a refill for colchicine 0.6 MG tablet for his gout, pt says that allopurinol (ZYLOPRIM) 300 MG tablet isn't helping.     Ph[facility]acy: [id number] - [place], [place] [address]      Please advise.     01/17/18.

## 2018-01-22 ENCOUNTER — Other Ambulatory Visit: Payer: Self-pay | Admitting: Internal Medicine

## 2018-01-23 ENCOUNTER — Other Ambulatory Visit (INDEPENDENT_AMBULATORY_CARE_PROVIDER_SITE_OTHER): Payer: Self-pay

## 2018-01-23 DIAGNOSIS — R7989 Other specified abnormal findings of blood chemistry: Secondary | ICD-10-CM

## 2018-01-23 DIAGNOSIS — R945 Abnormal results of liver function studies: Secondary | ICD-10-CM

## 2018-01-23 LAB — HEPATIC FUNCTION PANEL
ALT: 62 U/L — ABNORMAL HIGH (ref 0–53)
AST: 45 U/L — ABNORMAL HIGH (ref 0–37)
Albumin: 4.4 g/dL (ref 3.5–5.2)
Alkaline Phosphatase: 62 U/L (ref 39–117)
Bilirubin, Direct: 0.1 mg/dL (ref 0.0–0.3)
Total Bilirubin: 0.7 mg/dL (ref 0.2–1.2)
Total Protein: 7.3 g/dL (ref 6.0–8.3)

## 2018-01-30 ENCOUNTER — Emergency Department
Admission: EM | Admit: 2018-01-30 | Discharge: 2018-01-31 | Disposition: A | Discharge status: Home / Self Care | Payer: Self-pay | Attending: Emergency Medicine | Admitting: Emergency Medicine

## 2018-01-30 ENCOUNTER — Emergency Department: Payer: Self-pay

## 2018-01-30 DIAGNOSIS — Z79899 Other long term (current) drug therapy: Secondary | ICD-10-CM | POA: Insufficient documentation

## 2018-01-30 DIAGNOSIS — R200 Anesthesia of skin: Secondary | ICD-10-CM

## 2018-01-30 DIAGNOSIS — I10 Essential (primary) hypertension: Secondary | ICD-10-CM | POA: Insufficient documentation

## 2018-01-30 NOTE — ED Provider Notes (Signed)
[facility] Emergency Department Provider Note   ____________________________________________   First MD Initiated Contact with Patient 01/30/18 2310     (approximate)  I have reviewed the triage vital signs and the nursing notes.   HISTORY  Chief Complaint Dizziness and Numbness    HPI Cameron Sanchez is a 47 y.o. male brought to the ED from home via EMS with a chief complaint of right-sided numbness.  Patient with a history of hypertension, hypercholesterolemia who states he was sitting in the chair when he felt numb on his right arm and leg.  Girlfriend was with him and denied slurred speech, facial droop, confusion.  Patient felt weak on the right side although he stayed in the chair and did not get up.  Symptoms lasted approximately 30 minutes.  EMS reports stroke scale was negative on their arrival.  Patient denies similar symptoms previously.  Denies recent fever, chills, chest pain, shortness of breath, bowel pain, nausea, vomiting, diarrhea.  [place]   Past Medical History:  Diagnosis Date  . Allergy   . Chronic back pain   . Hypercholesterolemia   . Hypertension   . Migraines     Patient Active Problem List   Diagnosis Date Noted  . GERD (gastroesophageal reflux disease) 02/19/2017  . Gout 02/18/2017  . Bronchitis 07/23/2015  . Foot pain, left 12/30/2014  . Obesity (BMI 30-39.9) 12/21/2014  . Hyperglycemia 12/21/2014  . Stress 12/16/2013  . Ankle pain 12/16/2013  . Chronic back pain 10/31/2012  . Hypertension 10/31/2012  . Hypercholesterolemia 10/31/2012    Past Surgical History:  Procedure Laterality Date  . BACK SURGERY  08/12/14   multiple  . CHOLECYSTECTOMY    . KNEE ARTHROSCOPY     Dr HBK    Prior to Admission medications   Medication Sig Start Date End Date Taking? Authorizing Provider  carisoprodol (SOMA) 350 MG tablet Take 350 mg by mouth 4 (four) times daily as needed for muscle spasms.    Yes  [provider]  colchicine 0.6 MG tablet Take 1 tablet (0.6 mg total) by mouth 2 (two) times daily. 01/17/18  Yes Einar Pheasant, MD  fluticasone (FLONASE) 50 MCG/ACT nasal spray Place 2 sprays into both nostrils as needed. 10/24/14  Yes [provider]  metoprolol succinate (TOPROL-XL) 50 MG 24 hr tablet TAKE 2 TABLETS (100 MG TOTAL) BY MOUTH DAILY. TAKE WITH OR IMMEDIATELY FOLLOWING A MEAL. 01/24/18  Yes Einar Pheasant, MD  Oxycodone HCl 10 MG TABS Take 1 tablet by mouth 3 (three) times daily. 11/24/14  Yes [provider]  pantoprazole (PROTONIX) 40 MG tablet Take 1 tablet (40 mg total) by mouth daily. 12/27/17  Yes Einar Pheasant, MD  triamterene-hydrochlorothiazide (MAXZIDE-25) 37.5-25 MG tablet TAKE 1 TABLET BY MOUTH DAILY. 01/24/18  Yes Einar Pheasant, MD  allopurinol (ZYLOPRIM) 300 MG tablet TAKE 1 TABLET BY MOUTH EVERY DAY Patient not taking: Reported on 01/30/2018 11/21/17   Einar Pheasant, MD    Allergies Patient has no known allergies.  Family History  Problem Relation Age of Onset  . Cancer Father        germ cell (retroperitoneal)  . Coronary artery disease Unknown        s/p stent (age 70)  . Hypertension Paternal Grandfather   . Kidney cancer Paternal Grandfather   . Diabetes Paternal Grandfather   . Diabetes Maternal Grandfather   . Heart disease Cousin        MI - age  32    Social History Social History   Tobacco Use  . Smoking status: Never Smoker  . Smokeless tobacco: Never Used  Substance Use Topics  . Alcohol use: No    Alcohol/week: 0.0 oz  . Drug use: No    Review of Systems  Constitutional: No fever/chills. Eyes: No visual changes. ENT: No sore throat. Cardiovascular: Denies chest pain. Respiratory: Denies shortness of breath. Gastrointestinal: No abdominal pain.  No nausea, no vomiting.  No diarrhea.  No constipation. Genitourinary: Negative for dysuria. Musculoskeletal: Negative for back pain. Skin: Negative for  rash. Neurological: Negative for headaches, focal weakness.  Positive for right-sided numbness.   ____________________________________________   PHYSICAL EXAM:  VITAL SIGNS: ED Triage Vitals  Enc Vitals Group     BP 01/30/18 2248 (!) 169/78     Pulse Rate 01/30/18 2248 70     Resp 01/30/18 2248 12     Temp 01/30/18 2248 98.1 F (36.7 C)     Temp Source 01/30/18 2248 Oral     SpO2 01/30/18 2248 99 %     Weight 01/30/18 2249 214 lb (97.1 kg)     Height 01/30/18 2249 5\' 8"  (1.727 m)     Head Circumference --      Peak Flow --      Pain Score 01/30/18 2249 7     Pain Loc --      Pain Edu? --      Excl. in [place]? --     Constitutional: Alert and oriented. Well appearing and in no acute distress. Eyes: Conjunctivae are normal. PERRL. EOMI. Head: Atraumatic. Nose: No congestion/rhinnorhea. Mouth/Throat: Mucous membranes are moist.  Oropharynx non-erythematous. Neck: No stridor.  No carotid bruits. Cardiovascular: Normal rate, regular rhythm. Grossly normal heart sounds.  Good peripheral circulation. Respiratory: Normal respiratory effort.  No retractions. Lungs CTAB. Gastrointestinal: Soft and nontender. No distention. No abdominal bruits. No CVA tenderness. Musculoskeletal: No lower extremity tenderness nor edema.  No joint effusions. Neurologic: Alert and oriented x3.  CN II-XII grossly intact.  Normal speech and language. No gross focal neurologic deficits are appreciated. MAEx4. Skin:  Skin is warm, dry and intact. No rash noted. Psychiatric: Mood and affect are normal. Speech and behavior are normal.  ____________________________________________   LABS (all labs ordered are listed, but only abnormal results are displayed)  Labs Reviewed  COMPREHENSIVE METABOLIC PANEL - Abnormal; Notable for the following components:      Result Value   Glucose, Bld 108 (*)    AST 58 (*)    ALT 73 (*)    All other components within normal limits  CBC WITH DIFFERENTIAL/PLATELET    ETHANOL  TROPONIN I  URINE DRUG SCREEN, QUALITATIVE ([facility] ONLY)   ____________________________________________  EKG  ED ECG REPORT I, SUNG,JADE J, the attending physician, personally viewed and interpreted this ECG.   Date: 01/31/2018  EKG Time: 2243  Rate: 69  Rhythm: normal EKG, normal sinus rhythm  Axis: Normal  Intervals:none  ST&T Change: Nonspecific  ____________________________________________  RADIOLOGY  ED MD interpretation: No ICH, no acute cardiopulmonary process, MRI with no acute abnormalities, nonspecific findings  Official radiology report(s): Ct Head Wo Contrast  Result Date: 01/30/2018 CLINICAL DATA:  Dizziness and numbness EXAM: CT HEAD WITHOUT CONTRAST TECHNIQUE: Contiguous axial images were obtained from the base of the skull through the vertex without intravenous contrast. COMPARISON:  None. FINDINGS: Brain: No acute territorial infarction, hemorrhage or intracranial mass is visualized. The ventricles are nonenlarged. Vascular: No hyperdense vessels.  Scattered calcifications at the carotid siphons. Vertebral artery calcification. Skull: Normal. Negative for fracture or focal lesion. Sinuses/Orbits: No acute finding. Other: None IMPRESSION: Negative non contrasted CT appearance of the brain. Electronically Signed   By: Donavan Foil M.D.   On: 01/30/2018 23:36   Mr Jeri Cos And Wo Contrast  Result Date: 01/31/2018 CLINICAL DATA:  Initial evaluation for acute dizziness, numbness. EXAM: MRI HEAD WITHOUT AND WITH CONTRAST TECHNIQUE: Multiplanar, multiecho pulse sequences of the brain and surrounding structures were obtained without and with intravenous contrast. CONTRAST:  62mL MULTIHANCE GADOBENATE DIMEGLUMINE 529 MG/ML IV SOLN COMPARISON:  Prior CT from 01/30/2018. FINDINGS: Brain: Cerebral volume within normal limits for age. Few scattered tiny subcentimeter T2/FLAIR hyperintense foci noted within the periventricular, deep, and subcortical white matter both  cerebral hemispheres, predominantly involving the frontal lobes bilaterally. Most prominent of these foci position within the left frontal lobe and measures 5 mm (series 8, image 36). Findings are nonspecific, and of uncertain clinical significance. No abnormal foci of restricted diffusion to suggest acute or subacute ischemia. Gray-white matter differentiation maintained. No areas of chronic infarction identified. No acute or chronic intracranial hemorrhage. No mass lesion, midline shift or mass effect. No hydrocephalus. No extra-axial fluid collection. Major dural sinuses are grossly patent. Pituitary gland suprasellar region within normal limits. Midline structures intact. Vascular: Major intracranial vascular flow voids are maintained. Skull and upper cervical spine: Craniocervical junction within normal limits. Upper cervical spine normal. Bone marrow signal intensity within normal limits. No scalp soft tissue abnormality. Sinuses/Orbits: Globes and orbital soft tissues within normal limits. Left maxillary sinus retention cyst noted. Paranasal sinuses are otherwise clear. No mastoid effusion. Inner ear structures normal. Other: None. IMPRESSION: 1. No acute intracranial abnormality. 2. Mild nonspecific T2/FLAIR hyperintensities involving the supratentorial cerebral white matter as above. Findings are nonspecific, but mild for age. Differential considerations include sequelae of chronic small vessel ischemic disease, changes related to underlying migrainous disorder, sequelae of prior infectious or inflammatory process, or possibly vasculitis. Although not classic for underlying demyelinating disease, this could also be considered in the correct clinical setting. Electronically Signed   By: Jeannine Boga M.D.   On: 01/31/2018 04:29   Dg Chest Port 1 View  Result Date: 01/30/2018 CLINICAL DATA:  Numbness.  Dizziness. EXAM: PORTABLE CHEST 1 VIEW COMPARISON:  Frontal and lateral views 07/01/2017  FINDINGS: The cardiomediastinal contours are normal. Low lung volumes. Heart size likely normal for technique. Pulmonary vasculature is normal. No consolidation, pleural effusion, or pneumothorax. No acute osseous abnormalities are seen. IMPRESSION: No acute abnormality. Electronically Signed   By: Jeb Levering M.D.   On: 01/30/2018 23:41    ____________________________________________   PROCEDURES  Procedure(s) performed:  NIH Stroke Scale  Interval: Baseline Time: 6:13 AM Person Administering Scale: SUNG,JADE J  Administer stroke scale items in the order listed. Record performance in each category after each subscale exam. Do not go back and change scores. Follow directions provided for each exam technique. Scores should reflect what the patient does, not what the clinician thinks the patient can do. The clinician should record answers while administering the exam and work quickly. Except where indicated, the patient should not be coached (i.e., repeated requests to patient to make a special effort).   1a  Level of consciousness: 0=alert; keenly responsive  1b. LOC questions:  0=Performs both tasks correctly  1c. LOC commands: 0=Performs both tasks correctly  2.  Best Gaze: 0=normal  3.  Visual: 0=No visual loss  4. Facial  Palsy: 0=Normal symmetric movement  5a.  Motor left arm: 0=No drift, limb holds 90 (or 45) degrees for full 10 seconds  5b.  Motor right arm: 0=No drift, limb holds 90 (or 45) degrees for full 10 seconds  6a. motor left leg: 0=No drift, limb holds 90 (or 45) degrees for full 10 seconds  6b  Motor right leg:  0=No drift, limb holds 90 (or 45) degrees for full 10 seconds  7. Limb Ataxia: 0=Absent  8.  Sensory: 0=Normal; no sensory loss  9. Best Language:  0=No aphasia, normal  10. Dysarthria: 0=Normal  11. Extinction and Inattention: 0=No abnormality  12. Distal motor function: 0=Normal   Total:   0    Procedures  Critical Care performed:  None  ____________________________________________   INITIAL IMPRESSION / ASSESSMENT AND PLAN / ED COURSE  As part of my medical decision making, I reviewed the following data within the [facility] notes reviewed and incorporated, Labs reviewed, EKG interpreted, Old chart reviewed, Radiograph reviewed and Notes from prior ED visits   47 year old male with hypertension, hyperlipidemia who presents with 30 minutes of right-sided numbness.  Symptoms resolved, now with mild global headache.  Differential diagnosis includes, but is not limited to, CVA, TIA, intracranial hemorrhage, meningitis/encephalitis, cavernous venous thrombosis, tension headache, temporal arteritis, migraine or migraine equivalent, idiopathic intracranial hypertension, and non-specific headache.  No focal neurological deficits on exam.  Will initiate workup including CT head and reassess.  Clinical Course as of Feb 01 612  Tue Jan 31, 2018  0205 Patient resting no acute distress.  Updated patient and spouse of laboratory and imaging results.  Discussed with them possibly patient had TIA.  NIH stroke scale 0.  Discussed with patient obtaining MRI brain.  If negative, feel patient may be discharged home with outpatient follow-up.  Patient agreeable.  He has missed his evening dose of pain medicine for his chronic back pain.  Will administer and proceed with MRI of the brain.  [JS]  [id number] Patient resting no acute distress.  No focal neurological deficits noted.  Updated patient and spouse of MRI results.  I did offer admission for further evaluation by neurology.  Patient does not have insurance and prefers to follow-up with his PCP with neurology referral.  I asked patient to start taking a baby aspirin daily.  Very strict return instructions given.  Patient knows he is welcome to return at any time or if he changes his mind.  Both verbalize understanding and agree with plan of care.  [JS]    Clinical  Course User Index [JS] Paulette Blanch, MD     ____________________________________________   FINAL CLINICAL IMPRESSION(S) / ED DIAGNOSES  Final diagnoses:  Numbness     ED Discharge Orders    None       Note:  This document was prepared using Dragon voice recognition software and may include unintentional dictation errors.    Paulette Blanch, MD 01/31/18 [phone]

## 2018-01-30 NOTE — ED Triage Notes (Signed)
Pt arrived from home with complaints of dizziness and numbness to right side upper and lower extremities. EMS reported that pt had a similar episode about 3-4 months ago along with chest pain. EMS said that pt was simply sitting and speaking with friends, then about 30 min later his speech became delayed. EMS stated that the stroke scale was negative for them. VS per EMS BP manually- 128/78 BP automatic- 155/96 BS-104 O2sat- 98%RA. Pt is alert and oriented x 4.

## 2018-01-31 ENCOUNTER — Emergency Department: Payer: Self-pay

## 2018-01-31 LAB — CBC WITH DIFFERENTIAL/PLATELET
Basophils Absolute: 0 10*3/uL (ref 0–0.1)
Basophils Relative: 1 %
Eosinophils Absolute: 0.2 10*3/uL (ref 0–0.7)
Eosinophils Relative: 2 %
HCT: 44 % (ref 40.0–52.0)
Hemoglobin: 15.3 g/dL (ref 13.0–18.0)
Lymphocytes Relative: 39 %
Lymphs Abs: 3.6 10*3/uL (ref 1.0–3.6)
MCH: 30.2 pg (ref 26.0–34.0)
MCHC: 34.8 g/dL (ref 32.0–36.0)
MCV: 86.9 fL (ref 80.0–100.0)
Monocytes Absolute: 0.8 10*3/uL (ref 0.2–1.0)
Monocytes Relative: 9 %
Neutro Abs: 4.5 10*3/uL (ref 1.4–6.5)
Neutrophils Relative %: 49 %
Platelets: 154 10*3/uL (ref 150–440)
RBC: 5.06 MIL/uL (ref 4.40–5.90)
RDW: 13.5 % (ref 11.5–14.5)
WBC: 9.1 10*3/uL (ref 3.8–10.6)

## 2018-01-31 LAB — URINE DRUG SCREEN, QUALITATIVE (ARMC ONLY)
Amphetamines, Ur Screen: NOT DETECTED
Barbiturates, Ur Screen: NOT DETECTED
Benzodiazepine, Ur Scrn: NOT DETECTED
Cannabinoid 50 Ng, Ur ~~LOC~~: NOT DETECTED
Cocaine Metabolite,Ur ~~LOC~~: NOT DETECTED
MDMA (Ecstasy)Ur Screen: NOT DETECTED
Methadone Scn, Ur: NOT DETECTED
Opiate, Ur Screen: NOT DETECTED
Phencyclidine (PCP) Ur S: NOT DETECTED
Tricyclic, Ur Screen: NOT DETECTED

## 2018-01-31 LAB — COMPREHENSIVE METABOLIC PANEL
ALT: 73 U/L — ABNORMAL HIGH (ref 17–63)
AST: 58 U/L — ABNORMAL HIGH (ref 15–41)
Albumin: 4.5 g/dL (ref 3.5–5.0)
Alkaline Phosphatase: 70 U/L (ref 38–126)
Anion gap: 11 (ref 5–15)
BUN: 10 mg/dL (ref 6–20)
CO2: 24 mmol/L (ref 22–32)
Calcium: 9.1 mg/dL (ref 8.9–10.3)
Chloride: 102 mmol/L (ref 101–111)
Creatinine, Ser: 0.79 mg/dL (ref 0.61–1.24)
GFR calc Af Amer: >60 mL/min (ref >60)
GFR calc non Af Amer: >60 mL/min (ref >60)
Glucose, Bld: 108 mg/dL — ABNORMAL HIGH (ref 65–99)
Potassium: 3.9 mmol/L (ref 3.5–5.1)
Sodium: 137 mmol/L (ref 135–145)
Total Bilirubin: 0.9 mg/dL (ref 0.3–1.2)
Total Protein: 7.5 g/dL (ref 6.5–8.1)

## 2018-01-31 LAB — TROPONIN I: Troponin I: <0.03 ng/mL (ref <0.03)

## 2018-01-31 LAB — ETHANOL: Alcohol, Ethyl (B): <10 mg/dL (ref <10)

## 2018-01-31 MED ORDER — ASPIRIN EC 81 MG PO TBEC
81.0000 mg | DELAYED_RELEASE_TABLET | Freq: Once | ORAL | Status: AC
Start: 2018-01-31 — End: 2018-01-31
  Administered 2018-01-31: 81 mg via ORAL
  Filled 2018-01-31: qty 1

## 2018-01-31 MED ORDER — OXYCODONE HCL 5 MG PO TABS
10.0000 mg | ORAL_TABLET | Freq: Once | ORAL | Status: AC
  Administered 2018-01-31: 10 mg via ORAL
  Filled 2018-01-31: qty 2

## 2018-01-31 MED ORDER — GADOBENATE DIMEGLUMINE 529 MG/ML IV SOLN
20.0000 mL | Freq: Once | INTRAVENOUS | Status: AC | PRN
  Administered 2018-01-31: 20 mL via INTRAVENOUS

## 2018-01-31 NOTE — Discharge Instructions (Signed)
1.  Start baby aspirin daily. 2.  Contact your doctor today for follow-up visit as well as neurology referral to further evaluate your numbness. 3.  Return to the ER for worsening symptoms, persistent vomiting, difficulty breathing, lethargy, weakness or other concerns.

## 2018-02-01 NOTE — Telephone Encounter (Signed)
-----   Message from Lars Masson, LPN sent at 05/14/1600 10:28 AM EDT ----- Regarding: RE: FYI patient visit Patient is currently taking aspirin everyday and is agreeable to referral to neurology. He is ok with going to [facility]. He does not have a preference. ----- Message ----- From: Einar Pheasant, MD Sent: 01/31/2018   1:23 PM To: Lars Masson, LPN Subject: Cameron Sanchez: FYI patient visit                          Received information and this note regarding recent ED admission.  See note.  They had recommended neurology evaluation.  If pt agreeable?  If so, does he have a preference of where he goes or who he sees?  Is he ok with going to [place]?  Confirm taking aspirin daily and confirm doing ok.    Dr Nicki Reaper ----- Message ----- From: Paulette Blanch, MD Sent: 01/31/2018   6:13 AM To: Einar Pheasant, MD Subject: FYI patient visit                              Hi Dr. Nicki Reaper,  I had the pleasure of seeing Cameron Sanchez tonight for right-sided numbness; possible TIA. He had a NIH stroke scale = 0, work up included MRI Brain which was nonspecific. I offered hospital admission for further neuro workup but he declined due to not having health insurance. He preferred to have outpatient neurology follow up. I asked him to call your office for neurology referral. In the interim I have asked him to take a baby aspirin daily.  Thank you, Lurline Hare, MD

## 2018-02-01 NOTE — Telephone Encounter (Signed)
Order placed for neurology referral.   

## 2018-02-02 ENCOUNTER — Encounter: Payer: Self-pay | Admitting: Neurology

## 2018-02-20 ENCOUNTER — Other Ambulatory Visit: Payer: Self-pay

## 2018-02-20 ENCOUNTER — Telehealth: Payer: Self-pay

## 2018-02-20 ENCOUNTER — Encounter: Payer: Self-pay | Admitting: Neurology

## 2018-02-20 ENCOUNTER — Ambulatory Visit (INDEPENDENT_AMBULATORY_CARE_PROVIDER_SITE_OTHER): Payer: Self-pay | Admitting: Neurology

## 2018-02-20 VITALS — BP 100/64 | HR 71 | Ht 69.0 in | Wt 221.0 lb

## 2018-02-20 DIAGNOSIS — G459 Transient cerebral ischemic attack, unspecified: Secondary | ICD-10-CM

## 2018-02-20 DIAGNOSIS — I10 Essential (primary) hypertension: Secondary | ICD-10-CM

## 2018-02-20 DIAGNOSIS — E7800 Pure hypercholesterolemia, unspecified: Secondary | ICD-10-CM

## 2018-02-20 MED ORDER — ATORVASTATIN CALCIUM 80 MG PO TABS: 80.0000 mg | ORAL_TABLET | Freq: Every day | ORAL | 2 refills | Status: DC

## 2018-02-20 NOTE — Patient Instructions (Signed)
1.  Continue aspirin 81mg  daily 2.  Start atorvastatin 80mg  daily.  We will recheck a lipid profile with direct LDL in 3 months (about a week prior to follow up) 3.  Continue blood pressure control 4.  We will check CTA of head and neck 5.  We will check 2D echocardiogram 6.  We will check a 24 hour Holter monitor 7.  Mediterranean diet (see below) 8.  Routine exercise 9.  Follow up in 3 months.   Mediterranean Diet A Mediterranean diet refers to food and lifestyle choices that are based on the traditions of countries located on the [employer]. This way of eating has been shown to help prevent certain conditions and improve outcomes for people who have chronic diseases, like kidney disease and heart disease. What are tips for following this plan? Lifestyle  Cook and eat meals together with your family, when possible.  Drink enough fluid to keep your urine clear or pale yellow.  Be physically active every day. This includes: ? Aerobic exercise like running or swimming. ? Leisure activities like gardening, walking, or housework.  Get 7-8 hours of sleep each night.  If recommended by your health care provider, drink red wine in moderation. This means 1 glass a day for nonpregnant women and 2 glasses a day for men. A glass of wine equals 5 oz (150 mL). Reading food labels  Check the serving size of packaged foods. For foods such as rice and pasta, the serving size refers to the amount of cooked product, not dry.  Check the total fat in packaged foods. Avoid foods that have saturated fat or trans fats.  Check the ingredients list for added sugars, such as corn syrup. Shopping  At the grocery store, buy most of your food from the areas near the walls of the store. This includes: ? Fresh fruits and vegetables (produce). ? Grains, beans, nuts, and seeds. Some of these may be available in unpackaged forms or large amounts (in bulk). ? Fresh seafood. ? Poultry and  eggs. ? Low-fat dairy products.  Buy whole ingredients instead of prepackaged foods.  Buy fresh fruits and vegetables in-season from local farmers markets.  Buy frozen fruits and vegetables in resealable bags.  If you do not have access to quality fresh seafood, buy precooked frozen shrimp or canned fish, such as tuna, salmon, or sardines.  Buy small amounts of raw or cooked vegetables, salads, or olives from the deli or salad bar at your store.  Stock your pantry so you always have certain foods on hand, such as olive oil, canned tuna, canned tomatoes, rice, pasta, and beans. Cooking  Cook foods with extra-virgin olive oil instead of using butter or other vegetable oils.  Have meat as a side dish, and have vegetables or grains as your main dish. This means having meat in small portions or adding small amounts of meat to foods like pasta or stew.  Use beans or vegetables instead of meat in common dishes like chili or lasagna.  Experiment with different cooking methods. Try roasting or broiling vegetables instead of steaming or sauteing them.  Add frozen vegetables to soups, stews, pasta, or rice.  Add nuts or seeds for added healthy fat at each meal. You can add these to yogurt, salads, or vegetable dishes.  Marinate fish or vegetables using olive oil, lemon juice, garlic, and fresh herbs. Meal planning  Plan to eat 1 vegetarian meal one day each week. Try to work up to 2 vegetarian  meals, if possible.  Eat seafood 2 or more times a week.  Have healthy snacks readily available, such as: ? Vegetable sticks with hummus. ? [place] yogurt. ? Fruit and nut trail mix.  Eat balanced meals throughout the week. This includes: ? Fruit: 2-3 servings a day ? Vegetables: 4-5 servings a day ? Low-fat dairy: 2 servings a day ? Fish, poultry, or lean meat: 1 serving a day ? Beans and legumes: 2 or more servings a week ? Nuts and seeds: 1-2 servings a day ? Whole grains: 6-8 servings a  day ? Extra-virgin olive oil: 3-4 servings a day  Limit red meat and sweets to only a few servings a month What are my food choices?  Mediterranean diet ? Recommended ? Grains: Whole-grain pasta. Brown rice. Bulgar wheat. Polenta. Couscous. Whole-wheat bread. [place]. ? Vegetables: Artichokes. Beets. Broccoli. Cabbage. Carrots. Eggplant. Green beans. Chard. Kale. Spinach. Onions. Leeks. Peas. Squash. Tomatoes. Peppers. Radishes. ? Fruits: Apples. Apricots. Avocado. Berries. Bananas. Cherries. Dates. Figs. Grapes. Lemons. Melon. Oranges. Peaches. Plums. Pomegranate. ? Meats and other protein foods: Beans. Almonds. Sunflower seeds. Pine nuts. Peanuts. [place]. Salmon. Scallops. Shrimp. [place]. Tilapia. Clams. Oysters. Eggs. ? Dairy: Low-fat milk. Cheese. Greek yogurt. ? Beverages: Water. Red wine. Herbal tea. ? Fats and oils: Extra virgin olive oil. Avocado oil. Grape seed oil. ? Sweets and desserts: [place] yogurt with honey. Baked apples. Poached pears. Trail mix. ? Seasoning and other foods: Basil. Cilantro. Coriander. Cumin. Mint. Parsley. Sage. Rosemary. Tarragon. Garlic. Oregano. Thyme. Pepper. Balsalmic vinegar. Tahini. Hummus. Tomato sauce. Olives. Mushrooms. ? Limit these ? Grains: Prepackaged pasta or rice dishes. Prepackaged cereal with added sugar. ? Vegetables: Deep fried potatoes (french fries). ? Fruits: Fruit canned in syrup. ? Meats and other protein foods: Beef. Pork. Lamb. Poultry with skin. Hot dogs. [place] Salines. ? Dairy: Ice cream. Sour cream. Whole milk. ? Beverages: Juice. Sugar-sweetened soft drinks. Beer. Liquor and spirits. ? Fats and oils: Butter. Canola oil. Vegetable oil. Beef fat (tallow). Lard. ? Sweets and desserts: Cookies. Cakes. Pies. Candy. ? Seasoning and other foods: Mayonnaise. Premade sauces and marinades. ? The items listed may not be a complete list. Talk with your dietitian about what dietary choices are right for you. Summary  The Mediterranean diet  includes both food and lifestyle choices.  Eat a variety of fresh fruits and vegetables, beans, nuts, seeds, and whole grains.  Limit the amount of red meat and sweets that you eat.  Talk with your health care provider about whether it is safe for you to drink red wine in moderation. This means 1 glass a day for nonpregnant women and 2 glasses a day for men. A glass of wine equals 5 oz (150 mL). This information is not intended to replace advice given to you by your health care provider. Make sure you discuss any questions you have with your health care provider. Document Released: 06/24/2016 Document Revised: 07/27/2016 Document Reviewed: 06/24/2016 Elsevier Interactive Patient Education  [employer].

## 2018-02-20 NOTE — Progress Notes (Signed)
NEUROLOGY CONSULTATION NOTE  Cameron Sanchez MRN: [id number] DOB: [birth year]  Referring provider: Einar Pheasant, MD Primary care provider: Einar Pheasant, MD  Reason for consult:  TIA  HISTORY OF PRESENT ILLNESS: Cameron Sanchez is a 47 year old right-handed male with hypercholesterolemia, hypertension, chronic back pain and migraines who presents for evaluation of possible TIA presenting as right sided numbness.  History supplemented by ED notes.  On 01/30/18, he was sitting in his recliner when he suddenly felt numbness of his right arm and leg.  He also had trouble getting words out.  He did not have associated facial droop or unilateral weakness.  He noted a left sided headache.  Symptoms lasted about 60 minutes.  He was brought by EMS to the ED at [facility] for further evaluation and treatment.  CT of head was unremarkable.  MRI of brain with and without contrast showed mild nonspecific cerebral white matter changes but no acute stroke, bleed or mass lesion.  He deferred admission for observation, so he was advised to start aspirin 81mg  daily.  He has history of mild headaches once in a while but denies migraines.  He has history of hypertension (on medication) as well as hypercholesterolemia.  He was previously on statin therapy but was taken off due to cost and never restarted.  Heart disease runs in his family.  01/04/18:  Lipid panel with total cholesterol 197, TG 233, HDL 36.90 and direct LDL 134; Hgb A1c 6.3.  For the past year, he reports feeling intermittent diffuse weakness during physical activity.  01/31/18 LABS:  CBC with WBC 9.1, HGB 15.3, HCT 44, PLT 154; CMP with Na 137, K 3.9, Cl 102. CO2 24, glucose 108, BUN 10, Cr 0.79, t bili 0.9, ALP 70, AST 58 and ALT 73.  PAST MEDICAL HISTORY: Past Medical History:  Diagnosis Date  . Allergy   . Chronic back pain   . Hypercholesterolemia   . Hypertension   . Migraines     PAST SURGICAL HISTORY: Past  Surgical History:  Procedure Laterality Date  . BACK SURGERY  08/12/14   multiple  . CHOLECYSTECTOMY    . KNEE ARTHROSCOPY     Dr Joellyn Rued    MEDICATIONS: Current Outpatient Medications on File Prior to Visit  Medication Sig Dispense Refill  . aspirin EC 81 MG tablet Take 81 mg by mouth daily.    . carisoprodol (SOMA) 350 MG tablet Take 350 mg by mouth 4 (four) times daily as needed for muscle spasms.     . colchicine 0.6 MG tablet Take 1 tablet (0.6 mg total) by mouth 2 (two) times daily. 60 tablet 0  . metoprolol succinate (TOPROL-XL) 50 MG 24 hr tablet TAKE 2 TABLETS (100 MG TOTAL) BY MOUTH DAILY. TAKE WITH OR IMMEDIATELY FOLLOWING A MEAL. 60 tablet 0  . Oxycodone HCl 10 MG TABS Take 1 tablet by mouth 3 (three) times daily.  0  . triamterene-hydrochlorothiazide (MAXZIDE-25) 37.5-25 MG tablet TAKE 1 TABLET BY MOUTH DAILY. 30 tablet 0   No current facility-administered medications on file prior to visit.     ALLERGIES: No Known Allergies  FAMILY HISTORY: Family History  Problem Relation Age of Onset  . Cancer Father        germ cell (retroperitoneal)  . Coronary artery disease Unknown        s/p stent (age 89)  . Hypertension Paternal Grandfather   . Kidney cancer Paternal Grandfather   . Diabetes Paternal Grandfather   .  Diabetes Maternal Grandfather   . Heart disease Cousin        MI - age 16    SOCIAL HISTORY: Social History   Socioeconomic History  . Marital status: Significant Other    Spouse name: Not on file  . Number of children: 2  . Years of education: 26  . Highest education level: Not on file  Occupational History  . Occupation: disabled    Employer: [employer]  Social Needs  . Financial resource strain: Not on file  . Food insecurity:    Worry: Not on file    Inability: Not on file  . Transportation needs:    Medical: Not on file    Non-medical: Not on file  Tobacco Use  . Smoking status: Never Smoker  . Smokeless tobacco: Never Used    Substance and Sexual Activity  . Alcohol use: No    Alcohol/week: 0.0 oz  . Drug use: No  . Sexual activity: Not on file  Lifestyle  . Physical activity:    Days per week: Not on file    Minutes per session: Not on file  . Stress: Not on file  Relationships  . Social connections:    Talks on phone: Not on file    Gets together: Not on file    Attends religious service: Not on file    Active member of club or organization: Not on file    Attends meetings of clubs or organizations: Not on file    Relationship status: Not on file  . Intimate partner violence:    Fear of current or ex partner: Not on file    Emotionally abused: Not on file    Physically abused: Not on file    Forced sexual activity: Not on file  Other Topics Concern  . Not on file  Social History Narrative   Lives with girlfriend in a one story home.  Has 2 sons.  Trying to get disability due to several back issues.  Education: high school.     REVIEW OF SYSTEMS: Constitutional: No fevers, chills, or sweats, no generalized fatigue, change in appetite Eyes: No visual changes, double vision, eye pain Ear, nose and throat: No hearing loss, ear pain, nasal congestion, sore throat Cardiovascular: No chest pain, palpitations Respiratory:  No shortness of breath at rest or with exertion, wheezes GastrointestinaI: No nausea, vomiting, diarrhea, abdominal pain, fecal incontinence Genitourinary:  No dysuria, urinary retention or frequency Musculoskeletal:  No neck pain, back pain Integumentary: No rash, pruritus, skin lesions Neurological: as above Psychiatric: No depression, insomnia, anxiety Endocrine: No palpitations, fatigue, diaphoresis, mood swings, change in appetite, change in weight, increased thirst Hematologic/Lymphatic:  No purpura, petechiae. Allergic/Immunologic: no itchy/runny eyes, nasal congestion, recent allergic reactions, rashes  PHYSICAL EXAM: BP 100/64, Pulse 71, Wt 221 lb, Ht 5'9", O2  96% General: No acute distress.  Patient appears well-groomed.  Head:  Normocephalic/atraumatic Eyes:  fundi examined but not visualized Neck: supple, no paraspinal tenderness, full range of motion Back: No paraspinal tenderness Heart: regular rate and rhythm Lungs: Clear to auscultation bilaterally. Vascular: No carotid bruits. Neurological Exam: Mental status: alert and oriented to person, place, and time, recent and remote memory intact, fund of knowledge intact, attention and concentration intact, speech fluent and not dysarthric, language intact. Cranial nerves: CN I: not tested CN II: pupils equal, round and reactive to light, visual fields intact CN III, IV, VI:  full range of motion, no nystagmus, no ptosis CN V: facial  sensation intact CN VII: upper and lower face symmetric CN VIII: hearing intact CN IX, X: gag intact, uvula midline CN XI: sternocleidomastoid and trapezius muscles intact CN XII: tongue midline Bulk & Tone: normal, no fasciculations. Motor:  5/5 throughout  Sensation: temperature and vibration sensation intact. Deep Tendon Reflexes:  1+ throughout, toes downgoing.  Finger to nose testing:  Without dysmetria.  Heel to shin:  Without dysmetria.  Gait:  Normal station and stride.  Able to turn and tandem walk cautiously. Romberg negative.  IMPRESSION: Transient ischemic attack Hypertension Hyperlipidemia  PLAN: 1.  Continue ASA 81mg  daily for secondary stroke prevention 2.  We will start atorvastatin 80mg  daily.  LDL goal less than 70.  Recheck lipid panel with direct LDL in 3 months (prior to follow up) 3.  Continue blood pressure and glycemic control. 4.  Mediterranean diet and exercise 5.  Check CTA of head and neck, TTE and 24 hour Holter monitor 6.  Follow up in 3 months.  Thank you for allowing me to take part in the care of this patient.  Metta Clines, DO  CC:  Einar Pheasant, MD

## 2018-02-20 NOTE — Telephone Encounter (Signed)
[facility], spoke with Jackelyn Poling, she gave me CVD [phone], Left detailed message abourt 24 hr holter. Debbie also trx me to [facility] specialized scheduling, LM concerning CTA head and neck and 2D Echo

## 2018-02-21 ENCOUNTER — Other Ambulatory Visit: Payer: Self-pay | Admitting: Internal Medicine

## 2018-02-24 ENCOUNTER — Other Ambulatory Visit: Payer: Self-pay | Admitting: Internal Medicine

## 2018-02-27 ENCOUNTER — Ambulatory Visit: Payer: Self-pay

## 2018-02-27 DIAGNOSIS — I10 Essential (primary) hypertension: Secondary | ICD-10-CM

## 2018-02-27 DIAGNOSIS — E7800 Pure hypercholesterolemia, unspecified: Secondary | ICD-10-CM

## 2018-02-27 DIAGNOSIS — G459 Transient cerebral ischemic attack, unspecified: Secondary | ICD-10-CM

## 2018-02-28 ENCOUNTER — Encounter: Payer: Self-pay | Admitting: Internal Medicine

## 2018-02-28 ENCOUNTER — Ambulatory Visit (INDEPENDENT_AMBULATORY_CARE_PROVIDER_SITE_OTHER): Payer: Self-pay | Admitting: Internal Medicine

## 2018-02-28 DIAGNOSIS — E669 Obesity, unspecified: Secondary | ICD-10-CM

## 2018-02-28 DIAGNOSIS — G8929 Other chronic pain: Secondary | ICD-10-CM

## 2018-02-28 DIAGNOSIS — R739 Hyperglycemia, unspecified: Secondary | ICD-10-CM

## 2018-02-28 DIAGNOSIS — G459 Transient cerebral ischemic attack, unspecified: Secondary | ICD-10-CM

## 2018-02-28 DIAGNOSIS — E7800 Pure hypercholesterolemia, unspecified: Secondary | ICD-10-CM

## 2018-02-28 DIAGNOSIS — M545 Low back pain: Secondary | ICD-10-CM

## 2018-02-28 DIAGNOSIS — I10 Essential (primary) hypertension: Secondary | ICD-10-CM

## 2018-02-28 DIAGNOSIS — M109 Gout, unspecified: Secondary | ICD-10-CM

## 2018-02-28 DIAGNOSIS — F439 Reaction to severe stress, unspecified: Secondary | ICD-10-CM

## 2018-02-28 NOTE — Progress Notes (Addendum)
[name], male   DOB: 10-Jan-1971, 47 y.o.   MRN: [id number]   Subjective:    [name], male    DOB: 08/03/1971, 47 y.o.   MRN: [id number]  HPI  Patient here for a scheduled follow up.  Recently evaluated in ER 01/30/18 and diagnosed with TIA.  Presented with right sided numbness.  Saw neurlogy - Dr Tomi Likens.  Had MRI or brain that revealed mild non specific cerebral white matter changes but no acute stoke, bleed or mass lesion.   Was started on aspirin and lipitor.  Planning for holter monitor and CTA (head/neck).  Symptoms resolved.  No chest pain.  Breathing stable.  No acid reflux.  No abdominal pain.  Bowels moving.  No urine change.  Persistent back pain.  Limits his activity.  Has been followed by ortho.  Trying to get disability.  Increased stress related to this and his financial situation.  His problem is getting his medication. Discussed medication assistance programs.     Past Medical History:  Diagnosis Date  . Allergy   . Chronic back pain   . Hypercholesterolemia   . Hypertension   . Migraines    Past Surgical History:  Procedure Laterality Date  . BACK SURGERY  08/12/14   multiple  . CHOLECYSTECTOMY    . KNEE ARTHROSCOPY     Dr HBK   Family History  Problem Relation Age of Onset  . Cancer Father        germ cell (retroperitoneal)  . Coronary artery disease Unknown        s/p stent (age 47)  . Hypertension Paternal Grandfather   . Kidney cancer Paternal Grandfather   . Diabetes Paternal Grandfather   . Diabetes Maternal Grandfather   . Heart disease Cousin        MI - age 82   Social History   Socioeconomic History  . Marital status: Significant Other    Spouse name: Not on file  . Number of children: 2  . Years of education: 19  . Highest education level: Not on file  Occupational History  . Occupation: disabled    Employer: [employer]  Social Needs  . Financial resource strain: Not on file  . Food insecurity:      Worry: Not on file    Inability: Not on file  . Transportation needs:    Medical: Not on file    Non-medical: Not on file  Tobacco Use  . Smoking status: Never Smoker  . Smokeless tobacco: Never Used  Substance and Sexual Activity  . Alcohol use: No    Alcohol/week: 0.0 oz  . Drug use: No  . Sexual activity: Not on file  Lifestyle  . Physical activity:    Days per week: Not on file    Minutes per session: Not on file  . Stress: Not on file  Relationships  . Social connections:    Talks on phone: Not on file    Gets together: Not on file    Attends religious service: Not on file    Active member of club or organization: Not on file    Attends meetings of clubs or organizations: Not on file    Relationship status: Not on file  Other Topics Concern  . Not on file  Social History Narrative   Lives with girlfriend in a one story home.  Has 2 sons.  Trying to get disability due to several back issues.  Education: high school.     Outpatient Encounter Medications as of 02/28/2018  Medication Sig  . aspirin EC 81 MG tablet Take 81 mg by mouth daily.  Marland Kitchen atorvastatin (LIPITOR) 80 MG tablet Take 1 tablet (80 mg total) by mouth daily.  . carisoprodol (SOMA) 350 MG tablet Take 350 mg by mouth 4 (four) times daily as needed for muscle spasms.   . colchicine 0.6 MG tablet Take 1 tablet (0.6 mg total) by mouth 2 (two) times daily.  . metoprolol succinate (TOPROL-XL) 50 MG 24 hr tablet TAKE 2 TABLETS (100 MG TOTAL) BY MOUTH DAILY. TAKE WITH OR IMMEDIATELY FOLLOWING A MEAL.  Marland Kitchen Oxycodone HCl 10 MG TABS Take 1 tablet by mouth 3 (three) times daily.  . [DISCONTINUED] triamterene-hydrochlorothiazide (MAXZIDE-25) 37.5-25 MG tablet TAKE 1 TABLET BY MOUTH DAILY.   No facility-administered encounter medications on file as of 02/28/2018.     Review of Systems  Constitutional: Negative for appetite change and unexpected weight change.  HENT: Negative for congestion and sinus pressure.    Respiratory: Negative for cough, chest tightness and shortness of breath.   Cardiovascular: Negative for chest pain, palpitations and leg swelling.  Gastrointestinal: Negative for abdominal pain, diarrhea, nausea and vomiting.  Genitourinary: Negative for difficulty urinating and dysuria.  Musculoskeletal: Positive for back pain. Negative for joint swelling.       No recent gout flares.   Skin: Negative for color change and rash.  Neurological: Negative for dizziness, light-headedness and headaches.  Psychiatric/Behavioral: Negative for agitation and dysphoric mood.       Objective:     Blood pressure rechecked by me:  130/84  Physical Exam  Constitutional: He appears well-developed and well-nourished. No distress.  HENT:  Nose: Nose normal.  Mouth/Throat: No oropharyngeal exudate.  Neck: Neck supple. No thyromegaly present.  Cardiovascular: Normal rate and regular rhythm.  Pulmonary/Chest: Effort normal and breath sounds normal. No respiratory distress.  Abdominal: Soft. Bowel sounds are normal. There is no tenderness.  Musculoskeletal: He exhibits no edema or tenderness.  Lymphadenopathy:    He has no cervical adenopathy.  Skin: No rash noted. No erythema.  Psychiatric: He has a normal mood and affect. His behavior is normal.    BP 114/76 (BP Location: Left Arm, Patient Position: Sitting, Cuff Size: Normal)   Pulse 65   Temp 98 F (36.7 C) (Oral)   Resp 15   Ht 5' 9"  (1.753 m)   Wt 219 lb 9.6 oz (99.6 kg)   SpO2 97%   BMI 32.43 kg/m  Wt Readings from Last 3 Encounters:  02/28/18 219 lb 9.6 oz (99.6 kg)  02/20/18 221 lb (100.2 kg)  01/30/18 214 lb (97.1 kg)     Lab Results  Component Value Date   WBC 9.1 01/31/2018   HGB 15.3 01/31/2018   HCT 44.0 01/31/2018   PLT 154 01/31/2018   GLUCOSE 108 (H) 01/31/2018   CHOL 197 01/04/2018   TRIG 233.0 (H) 01/04/2018   HDL 36.90 (L) 01/04/2018   LDLDIRECT 134.0 01/04/2018   LDLCALC 123 (H) 02/18/2017   ALT 73 (H)  01/31/2018   AST 58 (H) 01/31/2018   NA 137 01/31/2018   K 3.9 01/31/2018   CL 102 01/31/2018   CREATININE 0.79 01/31/2018   BUN 10 01/31/2018   CO2 24 01/31/2018   TSH 3.59 01/04/2018   PSA 0.43 01/04/2018   HGBA1C 6.3 01/04/2018    Ct Head Wo Contrast  Result Date: 01/30/2018 CLINICAL DATA:  Dizziness and  numbness EXAM: CT HEAD WITHOUT CONTRAST TECHNIQUE: Contiguous axial images were obtained from the base of the skull through the vertex without intravenous contrast. COMPARISON:  None. FINDINGS: Brain: No acute territorial infarction, hemorrhage or intracranial mass is visualized. The ventricles are nonenlarged. Vascular: No hyperdense vessels. Scattered calcifications at the carotid siphons. Vertebral artery calcification. Skull: Normal. Negative for fracture or focal lesion. Sinuses/Orbits: No acute finding. Other: None IMPRESSION: Negative non contrasted CT appearance of the brain. Electronically Signed   By: Donavan Foil M.D.   On: 01/30/2018 23:36   Mr Jeri Cos And Wo Contrast  Result Date: 01/31/2018 CLINICAL DATA:  Initial evaluation for acute dizziness, numbness. EXAM: MRI HEAD WITHOUT AND WITH CONTRAST TECHNIQUE: Multiplanar, multiecho pulse sequences of the brain and surrounding structures were obtained without and with intravenous contrast. CONTRAST:  70m MULTIHANCE GADOBENATE DIMEGLUMINE 529 MG/ML IV SOLN COMPARISON:  Prior CT from 01/30/2018. FINDINGS: Brain: Cerebral volume within normal limits for age. Few scattered tiny subcentimeter T2/FLAIR hyperintense foci noted within the periventricular, deep, and subcortical white matter both cerebral hemispheres, predominantly involving the frontal lobes bilaterally. Most prominent of these foci position within the left frontal lobe and measures 5 mm (series 8, image 36). Findings are nonspecific, and of uncertain clinical significance. No abnormal foci of restricted diffusion to suggest acute or subacute ischemia. Gray-white matter  differentiation maintained. No areas of chronic infarction identified. No acute or chronic intracranial hemorrhage. No mass lesion, midline shift or mass effect. No hydrocephalus. No extra-axial fluid collection. Major dural sinuses are grossly patent. Pituitary gland suprasellar region within normal limits. Midline structures intact. Vascular: Major intracranial vascular flow voids are maintained. Skull and upper cervical spine: Craniocervical junction within normal limits. Upper cervical spine normal. Bone marrow signal intensity within normal limits. No scalp soft tissue abnormality. Sinuses/Orbits: Globes and orbital soft tissues within normal limits. Left maxillary sinus retention cyst noted. Paranasal sinuses are otherwise clear. No mastoid effusion. Inner ear structures normal. Other: None. IMPRESSION: 1. No acute intracranial abnormality. 2. Mild nonspecific T2/FLAIR hyperintensities involving the supratentorial cerebral white matter as above. Findings are nonspecific, but mild for age. Differential considerations include sequelae of chronic small vessel ischemic disease, changes related to underlying migrainous disorder, sequelae of prior infectious or inflammatory process, or possibly vasculitis. Although not classic for underlying demyelinating disease, this could also be considered in the correct clinical setting. Electronically Signed   By: BJeannine BogaM.D.   On: 01/31/2018 04:29   Dg Chest Port 1 View  Result Date: 01/30/2018 CLINICAL DATA:  Numbness.  Dizziness. EXAM: PORTABLE CHEST 1 VIEW COMPARISON:  Frontal and lateral views 07/01/2017 FINDINGS: The cardiomediastinal contours are normal. Low lung volumes. Heart size likely normal for technique. Pulmonary vasculature is normal. No consolidation, pleural effusion, or pneumothorax. No acute osseous abnormalities are seen. IMPRESSION: No acute abnormality. Electronically Signed   By: MJeb LeveringM.D.   On: 01/30/2018 23:41         Assessment & Plan:   Problem List Items Addressed This Visit    Chronic back pain    Followed by ortho.  Being evaluated for disability.        Gout    On colchicine.  No recent flares.  Follow.        Hypercholesterolemia    On lipitor.  Low cholesterol diet and exercise.  Follow lipid panel and liver function tests.        Relevant Orders   Hepatic function panel   Hyperglycemia  Low carb diet and exercise.  Follow met b and a1c.        Hypertension    Blood pressure under good control.  Continue same medication regimen.  Follow pressures.  Follow metabolic panel.        Obesity (BMI 30-39.9)    Discussed diet and exercise.  Follow.        Stress    Increased stress as outlined.  Information given for medication assistance.  Has information for [facility] assistance.        TIA (transient ischemic attack)    Recently admitted and diagnosed with TIA.  Saw neurology.  Planning for CT/MRI and holter.  Continue aspirin.  No recurring symptoms.            Einar Pheasant, MD

## 2018-03-01 ENCOUNTER — Ambulatory Visit (HOSPITAL_BASED_OUTPATIENT_CLINIC_OR_DEPARTMENT_OTHER)
Admission: RE | Admit: 2018-03-01 | Discharge: 2018-03-01 | Disposition: A | Discharge status: Home / Self Care | Payer: Self-pay | Source: Ambulatory Visit | Attending: Neurology | Admitting: Neurology

## 2018-03-01 ENCOUNTER — Ambulatory Visit
Admission: RE | Admit: 2018-03-01 | Discharge: 2018-03-01 | Disposition: A | Discharge status: Home / Self Care | Payer: Self-pay | Source: Ambulatory Visit | Attending: Neurology | Admitting: Neurology

## 2018-03-01 ENCOUNTER — Other Ambulatory Visit: Payer: Self-pay | Admitting: Neurology

## 2018-03-01 ENCOUNTER — Ambulatory Visit
Admission: RE | Admit: 2018-03-01 | Discharge: 2018-03-01 | Disposition: A | Discharge status: Home / Self Care | Payer: Self-pay | Source: Ambulatory Visit | Attending: Cardiovascular Disease | Admitting: Cardiovascular Disease

## 2018-03-01 DIAGNOSIS — G459 Transient cerebral ischemic attack, unspecified: Secondary | ICD-10-CM

## 2018-03-01 DIAGNOSIS — I10 Essential (primary) hypertension: Secondary | ICD-10-CM

## 2018-03-01 DIAGNOSIS — E7800 Pure hypercholesterolemia, unspecified: Secondary | ICD-10-CM

## 2018-03-01 NOTE — Progress Notes (Signed)
*  PRELIMINARY RESULTS* Echocardiogram 2D Echocardiogram has been performed.  Cameron Sanchez 03/01/2018, 10:46 AM

## 2018-03-02 ENCOUNTER — Other Ambulatory Visit: Payer: Self-pay | Admitting: Internal Medicine

## 2018-03-03 ENCOUNTER — Other Ambulatory Visit: Payer: Self-pay | Admitting: *Deleted

## 2018-03-03 ENCOUNTER — Encounter: Payer: Self-pay | Admitting: Internal Medicine

## 2018-03-03 DIAGNOSIS — G459 Transient cerebral ischemic attack, unspecified: Secondary | ICD-10-CM

## 2018-03-03 NOTE — Assessment & Plan Note (Signed)
On lipitor.  Low cholesterol diet and exercise.  Follow lipid panel and liver function tests.   

## 2018-03-03 NOTE — Assessment & Plan Note (Addendum)
Followed by ortho.  Being evaluated for disability.

## 2018-03-03 NOTE — Assessment & Plan Note (Signed)
Low carb diet and exercise.  Follow met b and a1c.   

## 2018-03-03 NOTE — Assessment & Plan Note (Signed)
Increased stress as outlined.  Information given for medication assistance.  Has information for [facility] assistance.

## 2018-03-03 NOTE — Assessment & Plan Note (Signed)
Recently admitted and diagnosed with TIA.  Saw neurology.  Planning for CT/MRI and holter.  Continue aspirin.  No recurring symptoms.

## 2018-03-03 NOTE — Assessment & Plan Note (Signed)
Blood pressure under good control.  Continue same medication regimen.  Follow pressures.  Follow metabolic panel.   

## 2018-03-03 NOTE — Assessment & Plan Note (Signed)
Discussed diet and exercise.  Follow.  

## 2018-03-03 NOTE — Assessment & Plan Note (Signed)
On colchicine.  No recent flares.  Follow.

## 2018-03-06 ENCOUNTER — Telehealth: Payer: Self-pay

## 2018-03-06 NOTE — Telephone Encounter (Signed)
Called and spoke with Pt, advsd him of Echo results. Pt mentioned he has not heard from anyone concerning the CTA of head and neck. Provided him with the telephone number to centralized scheduling.

## 2018-03-06 NOTE — Telephone Encounter (Signed)
-----   Message from Pieter Partridge, DO sent at 03/06/2018  8:13 AM EDT ----- Echocardiogram is unremarkable.

## 2018-03-10 ENCOUNTER — Encounter: Payer: Self-pay | Admitting: Neurology

## 2018-03-15 ENCOUNTER — Other Ambulatory Visit: Payer: Self-pay

## 2018-03-21 DIAGNOSIS — M961 Postlaminectomy syndrome, not elsewhere classified: Secondary | ICD-10-CM | POA: Diagnosis not present

## 2018-03-21 DIAGNOSIS — S39012A Strain of muscle, fascia and tendon of lower back, initial encounter: Secondary | ICD-10-CM | POA: Diagnosis not present

## 2018-03-21 DIAGNOSIS — W172XXA Fall into hole, initial encounter: Secondary | ICD-10-CM | POA: Diagnosis not present

## 2018-03-21 DIAGNOSIS — M25551 Pain in right hip: Secondary | ICD-10-CM | POA: Diagnosis not present

## 2018-03-21 DIAGNOSIS — M545 Low back pain: Secondary | ICD-10-CM | POA: Diagnosis not present

## 2018-04-07 ENCOUNTER — Ambulatory Visit: Payer: Medicare Other | Admitting: Internal Medicine

## 2018-04-07 ENCOUNTER — Encounter: Payer: Self-pay | Admitting: Internal Medicine

## 2018-04-07 VITALS — BP 130/74 | HR 68 | Temp 98.1°F | Ht 69.0 in | Wt 216.0 lb

## 2018-04-07 DIAGNOSIS — W57XXXA Bitten or stung by nonvenomous insect and other nonvenomous arthropods, initial encounter: Secondary | ICD-10-CM

## 2018-04-07 DIAGNOSIS — S40862A Insect bite (nonvenomous) of left upper arm, initial encounter: Secondary | ICD-10-CM | POA: Diagnosis not present

## 2018-04-07 MED ORDER — DOXYCYCLINE HYCLATE 100 MG PO TABS: 100.0000 mg | ORAL_TABLET | Freq: Two times a day (BID) | ORAL | 0 refills | Status: DC

## 2018-04-07 NOTE — Patient Instructions (Signed)
Start the antibiotic if the tick bite site starts getting inflamed, or it you start with fever or worsened headache.

## 2018-04-07 NOTE — Assessment & Plan Note (Signed)
No evidence of secondary infection but some early headache Discussed local care Start doxy if worsened headache, fever or local infection

## 2018-04-07 NOTE — Progress Notes (Signed)
Subjective:    [name], male    DOB: 1971-03-23, 47 y.o.   MRN: [id number]  HPI Here due to concern about a tick bite  Noticed a dark area on upper left arm--yesterday Thought it was a pimple Had been doing work in yard 6 days ago---wearing gloves  This morning, he realized it was a tick He took it off with tweezers Not engorged Brought it in (deer tick size)  No fever Some headache for 2 days  No rash  Current Outpatient Medications on File Prior to Visit  Medication Sig Dispense Refill  . aspirin EC 81 MG tablet Take 81 mg by mouth daily.    Marland Kitchen atorvastatin (LIPITOR) 80 MG tablet Take 1 tablet (80 mg total) by mouth daily. 30 tablet 2  . carisoprodol (SOMA) 350 MG tablet Take 350 mg by mouth 4 (four) times daily as needed for muscle spasms.     . colchicine 0.6 MG tablet Take 1 tablet (0.6 mg total) by mouth 2 (two) times daily. 60 tablet 0  . metoprolol succinate (TOPROL-XL) 50 MG 24 hr tablet TAKE 2 TABLETS (100 MG TOTAL) BY MOUTH DAILY. TAKE WITH OR IMMEDIATELY FOLLOWING A MEAL. 60 tablet 0  . Oxycodone HCl 10 MG TABS Take 1 tablet by mouth 3 (three) times daily.  0  . triamterene-hydrochlorothiazide (MAXZIDE-25) 37.5-25 MG tablet TAKE 1 TABLET BY MOUTH DAILY. 30 tablet 0   No current facility-administered medications on file prior to visit.     No Known Allergies  Past Medical History:  Diagnosis Date  . Allergy   . Chronic back pain   . Hypercholesterolemia   . Hypertension   . Migraines     Past Surgical History:  Procedure Laterality Date  . BACK SURGERY  08/12/14   multiple  . CHOLECYSTECTOMY    . KNEE ARTHROSCOPY     Dr HBK    Family History  Problem Relation Age of Onset  . Cancer Father        germ cell (retroperitoneal)  . Coronary artery disease Unknown        s/p stent (age 35)  . Hypertension Paternal Grandfather   . Kidney cancer Paternal Grandfather   . Diabetes Paternal Grandfather   . Diabetes Maternal Grandfather    . Heart disease Cousin        MI - age 9    Social History   Socioeconomic History  . Marital status: Significant Other    Spouse name: Not on file  . Number of children: 2  . Years of education: 40  . Highest education level: Not on file  Occupational History  . Occupation: disabled    Employer: [employer]  Social Needs  . Financial resource strain: Not on file  . Food insecurity:    Worry: Not on file    Inability: Not on file  . Transportation needs:    Medical: Not on file    Non-medical: Not on file  Tobacco Use  . Smoking status: Never Smoker  . Smokeless tobacco: Never Used  Substance and Sexual Activity  . Alcohol use: No    Alcohol/week: 0.0 oz  . Drug use: No  . Sexual activity: Not on file  Lifestyle  . Physical activity:    Days per week: Not on file    Minutes per session: Not on file  . Stress: Not on file  Relationships  . Social connections:    Talks on phone:  Not on file    Gets together: Not on file    Attends religious service: Not on file    Active member of club or organization: Not on file    Attends meetings of clubs or organizations: Not on file    Relationship status: Not on file  . Intimate partner violence:    Fear of current or ex partner: Not on file    Emotionally abused: Not on file    Physically abused: Not on file    Forced sexual activity: Not on file  Other Topics Concern  . Not on file  Social History Narrative   Lives with girlfriend in a one story home.  Has 2 sons.  Trying to get disability due to several back issues.  Education: high school.    Review of Systems Chronic back pain --on meds for this No joint swelling    Objective:   Physical Exam  Constitutional: He appears well-developed. No distress.  Musculoskeletal:  No active joint swelling  Skin:  2-10mm nodule on posterior upper left arm (at shoulder). Slight retained tick parts removed No sig rash  Separate papulovesicular area on volar  left forearm (contact)           Assessment & Plan:

## 2018-04-08 ENCOUNTER — Other Ambulatory Visit: Payer: Self-pay | Admitting: Internal Medicine

## 2018-04-24 ENCOUNTER — Other Ambulatory Visit: Payer: Self-pay | Admitting: Internal Medicine

## 2018-05-01 DIAGNOSIS — J019 Acute sinusitis, unspecified: Secondary | ICD-10-CM | POA: Diagnosis not present

## 2018-05-08 DIAGNOSIS — M961 Postlaminectomy syndrome, not elsewhere classified: Secondary | ICD-10-CM | POA: Diagnosis not present

## 2018-05-08 DIAGNOSIS — M25562 Pain in left knee: Secondary | ICD-10-CM | POA: Diagnosis not present

## 2018-05-14 ENCOUNTER — Other Ambulatory Visit: Payer: Self-pay | Admitting: Internal Medicine

## 2018-05-23 ENCOUNTER — Other Ambulatory Visit: Payer: Self-pay | Admitting: Internal Medicine

## 2018-06-01 ENCOUNTER — Telehealth: Payer: Self-pay | Admitting: Neurology

## 2018-06-01 NOTE — Telephone Encounter (Signed)
Called Pt, his VM is not set up, unable to leave message.  I did speak with this Pt on 03/06/18 and provided him the telephone number to call to schedule imaging studies.

## 2018-06-01 NOTE — Telephone Encounter (Signed)
Patients that he wanted to cancel his appt on 06-02-18 sue to him not having the test done that Dr Tomi Likens wanted. He states that no one ever called him to sch the test. He is no resch from 08-04-18 so he wants to talk to someone about this

## 2018-06-02 ENCOUNTER — Ambulatory Visit: Payer: Self-pay | Admitting: Neurology

## 2018-06-06 NOTE — Telephone Encounter (Signed)
Called and spoke with Pt. He said when he called the number I gave him for [facility] to schedule his imaging studies, he was told they were busy and someone would return his call. No one called him back. Provided him the number to centralized scheduling.

## 2018-06-17 ENCOUNTER — Encounter: Payer: Self-pay | Admitting: Emergency Medicine

## 2018-06-17 ENCOUNTER — Emergency Department
Admission: EM | Admit: 2018-06-17 | Discharge: 2018-06-17 | Disposition: A | Discharge status: Home / Self Care | Payer: Medicare Other | Attending: Emergency Medicine | Admitting: Emergency Medicine

## 2018-06-17 ENCOUNTER — Other Ambulatory Visit: Payer: Self-pay

## 2018-06-17 DIAGNOSIS — Y9389 Activity, other specified: Secondary | ICD-10-CM | POA: Diagnosis not present

## 2018-06-17 DIAGNOSIS — Z9049 Acquired absence of other specified parts of digestive tract: Secondary | ICD-10-CM | POA: Insufficient documentation

## 2018-06-17 DIAGNOSIS — Y998 Other external cause status: Secondary | ICD-10-CM | POA: Insufficient documentation

## 2018-06-17 DIAGNOSIS — Y929 Unspecified place or not applicable: Secondary | ICD-10-CM | POA: Diagnosis not present

## 2018-06-17 DIAGNOSIS — Z79899 Other long term (current) drug therapy: Secondary | ICD-10-CM | POA: Insufficient documentation

## 2018-06-17 DIAGNOSIS — S00201A Unspecified superficial injury of right eyelid and periocular area, initial encounter: Secondary | ICD-10-CM | POA: Diagnosis present

## 2018-06-17 DIAGNOSIS — Z8673 Personal history of transient ischemic attack (TIA), and cerebral infarction without residual deficits: Secondary | ICD-10-CM | POA: Insufficient documentation

## 2018-06-17 DIAGNOSIS — W298XXA Contact with other powered powered hand tools and household machinery, initial encounter: Secondary | ICD-10-CM | POA: Diagnosis not present

## 2018-06-17 DIAGNOSIS — S0011XA Contusion of right eyelid and periocular area, initial encounter: Secondary | ICD-10-CM | POA: Diagnosis not present

## 2018-06-17 DIAGNOSIS — I10 Essential (primary) hypertension: Secondary | ICD-10-CM | POA: Insufficient documentation

## 2018-06-17 DIAGNOSIS — Z7982 Long term (current) use of aspirin: Secondary | ICD-10-CM | POA: Diagnosis not present

## 2018-06-17 MED ORDER — ERYTHROMYCIN 5 MG/GM OP OINT: 1.0000 "application " | TOPICAL_OINTMENT | Freq: Every day | OPHTHALMIC | 0 refills | Status: AC

## 2018-06-17 NOTE — ED Triage Notes (Signed)
approx 30 minutes ago drill spun around in his hand and hit R upper eyelid, ecchymotic area L upper eyelid, no visible injury to eye.

## 2018-06-17 NOTE — ED Provider Notes (Signed)
[facility] Emergency Department Provider Note  ____________________________________________  Time seen: Approximately 7:30 PM  I have reviewed the triage vital signs and the nursing notes.   HISTORY  Chief Complaint Eyelid Pain    HPI Cameron Sanchez is a 47 y.o. male presents to the emergency department after a drill spun unexpectedly and struck patient in the right upper eyelid.  Patient has noticed a small area of ecchymosis with a linear abrasion, approximately 0.5 cm.  Patient has noticed no increased tearing, photophobia, new onset blurry vision or pain with opening and closing eyelids.  Patient has noticed no bleeding from eyelid.  Patient is able to move his eyes in different directions without pain.  Tetanus status is up-to-date.   Past Medical History:  Diagnosis Date  . Allergy   . Chronic back pain   . Hypercholesterolemia   . Hypertension   . Migraines     Patient Active Problem List   Diagnosis Date Noted  . Tick bite of left upper arm 04/07/2018  . TIA (transient ischemic attack) 03/03/2018  . GERD (gastroesophageal reflux disease) 02/19/2017  . Gout 02/18/2017  . Foot pain, left 12/30/2014  . Obesity (BMI 30-39.9) 12/21/2014  . Hyperglycemia 12/21/2014  . Stress 12/16/2013  . Ankle pain 12/16/2013  . Chronic back pain 10/31/2012  . Hypertension 10/31/2012  . Hypercholesterolemia 10/31/2012    Past Surgical History:  Procedure Laterality Date  . BACK SURGERY  08/12/14   multiple  . CHOLECYSTECTOMY    . KNEE ARTHROSCOPY     Dr HBK    Prior to Admission medications   Medication Sig Start Date End Date Taking? Authorizing Provider  aspirin EC 81 MG tablet Take 81 mg by mouth daily.    [provider]  atorvastatin (LIPITOR) 80 MG tablet Take 1 tablet (80 mg total) by mouth daily. 02/20/18   Pieter Partridge, DO  carisoprodol (SOMA) 350 MG tablet Take 350 mg by mouth 4 (four) times daily as needed for muscle spasms.      [provider]  colchicine 0.6 MG tablet Take 1 tablet (0.6 mg total) by mouth 2 (two) times daily. 01/17/18   Einar Pheasant, MD  doxycycline (VIBRA-TABS) 100 MG tablet Take 1 tablet (100 mg total) by mouth 2 (two) times daily. 04/07/18   Venia Carbon, MD  erythromycin ophthalmic ointment Place 1 application into the right eye at bedtime for 7 days. 06/17/18 06/24/18  Lannie Fields, PA-C  metoprolol succinate (TOPROL-XL) 50 MG 24 hr tablet TAKE 2 TABLETS BY MOUTH DAILY WITH OR IMMEDIATELY FOLLOWING A MEAL. 05/23/18   Einar Pheasant, MD  Oxycodone HCl 10 MG TABS Take 1 tablet by mouth 3 (three) times daily. 11/24/14   [provider]  triamterene-hydrochlorothiazide (MAXZIDE-25) 37.5-25 MG tablet TAKE 1 TABLET BY MOUTH DAILY. 05/15/18   Einar Pheasant, MD    Allergies Patient has no known allergies.  Family History  Problem Relation Age of Onset  . Cancer Father        germ cell (retroperitoneal)  . Coronary artery disease Unknown        s/p stent (age 62)  . Hypertension Paternal Grandfather   . Kidney cancer Paternal Grandfather   . Diabetes Paternal Grandfather   . Diabetes Maternal Grandfather   . Heart disease Cousin        MI - age 41    Social History Social History   Tobacco Use  . Smoking status: Never Smoker  .  Smokeless tobacco: Never Used  Substance Use Topics  . Alcohol use: No    Alcohol/week: 0.0 oz  . Drug use: No     Review of Systems  Constitutional: No fever/chills Eyes: No visual changes. No discharge ENT: No upper respiratory complaints. Cardiovascular: no chest pain. Respiratory: no cough. No SOB. Gastrointestinal: No abdominal pain.  No nausea, no vomiting.  No diarrhea.  No constipation. Genitourinary: Negative for dysuria. No hematuria Musculoskeletal: Negative for musculoskeletal pain. Skin: Patient has right upper eyelid contusion.  Neurological: Negative for headaches, focal weakness or  numbness.   ____________________________________________   PHYSICAL EXAM:  VITAL SIGNS: ED Triage Vitals  Enc Vitals Group     BP 06/17/18 1747 136/78     Pulse Rate 06/17/18 1747 87     Resp 06/17/18 1747 20     Temp 06/17/18 1747 99 F (37.2 C)     Temp Source 06/17/18 1747 Oral     SpO2 06/17/18 1747 99 %     Weight 06/17/18 1748 211 lb (95.7 kg)     Height 06/17/18 1748 5\' 7"  (1.702 m)     Head Circumference --      Peak Flow --      Pain Score 06/17/18 1748 7     Pain Loc --      Pain Edu? --      Excl. in [place]? --      Constitutional: Alert and oriented. Well appearing and in no acute distress. Eyes: Conjunctivae are normal.  Patient has 0.5 cm abrasion of right upper eyelid with mild surrounding ecchymosis.  Patient is holding his right eye open without difficulty.  No internal upper eyelid lacerations with eyelid eversion.  PERRL. EOMI. Head: Atraumatic. ENT:      Ears: TMs are pearly.      Nose: No congestion/rhinnorhea.      Mouth/Throat: Mucous membranes are moist.  Neck: No stridor.  No cervical spine tenderness to palpation. Cardiovascular: Normal rate, regular rhythm. Normal S1 and S2.  Good peripheral circulation. Respiratory: Normal respiratory effort without tachypnea or retractions. Lungs CTAB. Good air entry to the bases with no decreased or absent breath sounds. Musculoskeletal: Full range of motion to all extremities. No gross deformities appreciated. Neurologic:  Normal speech and language. No gross focal neurologic deficits are appreciated.  Skin:  Skin is warm, dry and intact. No rash noted.   ____________________________________________   LABS (all labs ordered are listed, but only abnormal results are displayed)  Labs Reviewed - No data to display ____________________________________________  EKG   ____________________________________________  RADIOLOGY   No results  found.  ____________________________________________    PROCEDURES  Procedure(s) performed:    Procedures    Medications - No data to display   ____________________________________________   INITIAL IMPRESSION / ASSESSMENT AND PLAN / ED COURSE  Pertinent labs & imaging results that were available during my care of the patient were reviewed by me and considered in my medical decision making (see chart for details).  Review of the [place] CSRS was performed in accordance of the [place] prior to dispensing any controlled drugs.      Assessment and plan Right eyelid contusion Patient presents to the emergency department with a 0.5 cm right upper eyelid abrasion with some surrounding ecchymosis.  Overall physical exam was reassuring.  Patient was discharged with erythromycin ointment and advised to follow-up with ophthalmology as needed.  All patient questions were answered.    ____________________________________________  FINAL CLINICAL IMPRESSION(S) / ED  DIAGNOSES  Final diagnoses:  Contusion of right eyelid, initial encounter      NEW MEDICATIONS STARTED DURING THIS VISIT:  ED Discharge Orders        Ordered    erythromycin ophthalmic ointment  Daily at bedtime     06/17/18 1818          This chart was dictated using voice recognition software/Dragon. Despite best efforts to proofread, errors can occur which can change the meaning. Any change was purely unintentional.    Lannie Fields, PA-C 06/17/18 Cheri Rous, MD 06/17/18 [phone]

## 2018-06-19 ENCOUNTER — Ambulatory Visit (INDEPENDENT_AMBULATORY_CARE_PROVIDER_SITE_OTHER): Payer: Medicare Other | Admitting: Internal Medicine

## 2018-06-19 ENCOUNTER — Other Ambulatory Visit: Payer: Self-pay | Admitting: Internal Medicine

## 2018-06-19 ENCOUNTER — Encounter: Payer: Self-pay | Admitting: Internal Medicine

## 2018-06-19 VITALS — BP 130/80 | HR 87 | Temp 97.9°F | Resp 18 | Wt 224.0 lb

## 2018-06-19 DIAGNOSIS — S0591XD Unspecified injury of right eye and orbit, subsequent encounter: Secondary | ICD-10-CM

## 2018-06-19 DIAGNOSIS — G4730 Sleep apnea, unspecified: Secondary | ICD-10-CM | POA: Diagnosis not present

## 2018-06-19 DIAGNOSIS — G459 Transient cerebral ischemic attack, unspecified: Secondary | ICD-10-CM | POA: Diagnosis not present

## 2018-06-19 DIAGNOSIS — M545 Low back pain: Secondary | ICD-10-CM | POA: Diagnosis not present

## 2018-06-19 DIAGNOSIS — K219 Gastro-esophageal reflux disease without esophagitis: Secondary | ICD-10-CM | POA: Diagnosis not present

## 2018-06-19 DIAGNOSIS — R739 Hyperglycemia, unspecified: Secondary | ICD-10-CM | POA: Diagnosis not present

## 2018-06-19 DIAGNOSIS — E669 Obesity, unspecified: Secondary | ICD-10-CM | POA: Diagnosis not present

## 2018-06-19 DIAGNOSIS — E7800 Pure hypercholesterolemia, unspecified: Secondary | ICD-10-CM | POA: Diagnosis not present

## 2018-06-19 DIAGNOSIS — G8929 Other chronic pain: Secondary | ICD-10-CM | POA: Diagnosis not present

## 2018-06-19 DIAGNOSIS — I10 Essential (primary) hypertension: Secondary | ICD-10-CM

## 2018-06-19 MED ORDER — HYDROCORTISONE ACE-PRAMOXINE 1-1 % RE CREA: 1.0000 "application " | TOPICAL_CREAM | Freq: Two times a day (BID) | RECTAL | 0 refills | Status: DC

## 2018-06-19 MED ORDER — ATORVASTATIN CALCIUM 80 MG PO TABS: 80.0000 mg | ORAL_TABLET | Freq: Every day | ORAL | 0 refills | Status: DC

## 2018-06-19 NOTE — Progress Notes (Signed)
[name], male   DOB: 1971-08-02, 47 y.o.   MRN: [id number]   Subjective:    [name], male    DOB: August 15, 1971, 47 y.o.   MRN: [id number]  HPI  Patient here for a scheduled follow up.  He was seen in ER 06/17/18 with contusion/laceration of right eyelid.  Was referred to [facility].  Did not go.  Declines to go.  Eyelid bruised, but area closed.  No vision change.  Still with persistent back pain.  Sees Dr Mauri Pole.  He prescribes his oxycodone.  Takes 4 per day.  Is on disability now.  No chest pain reported.  No sob.  No acid reflux.  Some problems with constipation.  Dulcolax helps.  Has been off his cholesterol medication for two months.  Stopped taking.  Discussed importance of taking his medication regularly.  Previous concern regarding TIA.  Saw Dr Tomi Likens.  Still has not had CTA.  Plans to reschedule.  Is taking aspirin daily.  No recurring episode.  Concern regarding weight gain.  Discussed diet and exercise.  Previously diagnosed with sleep apnea.  Never treated.  Discussed recent TIA, elevated blood pressure, weight gain, daytime somnolence and concern regarding untreated sleep apnea.  Agrees with referral to pulmonary for sleep study.     Past Medical History:  Diagnosis Date  . Allergy   . Chronic back pain   . Hypercholesterolemia   . Hypertension   . Migraines    Past Surgical History:  Procedure Laterality Date  . BACK SURGERY  08/12/14   multiple  . CHOLECYSTECTOMY    . KNEE ARTHROSCOPY     Dr HBK   Family History  Problem Relation Age of Onset  . Cancer Father        germ cell (retroperitoneal)  . Coronary artery disease Unknown        s/p stent (age 89)  . Hypertension Paternal Grandfather   . Kidney cancer Paternal Grandfather   . Diabetes Paternal Grandfather   . Diabetes Maternal Grandfather   . Heart disease Cousin        MI - age 58   Social History   Socioeconomic History  . Marital status: Significant Other   Spouse name: Not on file  . Number of children: 2  . Years of education: 37  . Highest education level: Not on file  Occupational History  . Occupation: disabled    Employer: [employer]  Social Needs  . Financial resource strain: Not on file  . Food insecurity:    Worry: Not on file    Inability: Not on file  . Transportation needs:    Medical: Not on file    Non-medical: Not on file  Tobacco Use  . Smoking status: Never Smoker  . Smokeless tobacco: Never Used  Substance and Sexual Activity  . Alcohol use: No    Alcohol/week: 0.0 oz  . Drug use: No  . Sexual activity: Not on file  Lifestyle  . Physical activity:    Days per week: Not on file    Minutes per session: Not on file  . Stress: Not on file  Relationships  . Social connections:    Talks on phone: Not on file    Gets together: Not on file    Attends religious service: Not on file    Active member of club or organization: Not on file    Attends meetings of clubs or organizations:  Not on file    Relationship status: Not on file  Other Topics Concern  . Not on file  Social History Narrative   Lives with girlfriend in a one story home.  Has 2 sons.  Trying to get disability due to several back issues.  Education: high school.     Outpatient Encounter Medications as of 06/19/2018  Medication Sig  . allopurinol (ZYLOPRIM) 300 MG tablet Take 300 mg by mouth daily.  Marland Kitchen aspirin EC 81 MG tablet Take 81 mg by mouth daily.  Marland Kitchen atorvastatin (LIPITOR) 80 MG tablet Take 1 tablet (80 mg total) by mouth daily.  . carisoprodol (SOMA) 350 MG tablet Take 350 mg by mouth 4 (four) times daily as needed for muscle spasms.   . colchicine 0.6 MG tablet Take 1 tablet (0.6 mg total) by mouth 2 (two) times daily.  Marland Kitchen erythromycin ophthalmic ointment Place 1 application into the right eye at bedtime for 7 days.  . metoprolol succinate (TOPROL-XL) 50 MG 24 hr tablet TAKE 2 TABLETS BY MOUTH DAILY WITH OR IMMEDIATELY FOLLOWING A MEAL.   Marland Kitchen Oxycodone HCl 10 MG TABS Take 1 tablet by mouth 3 (three) times daily.  . pantoprazole (PROTONIX) 40 MG tablet Take 40 mg by mouth daily.  . pramoxine-hydrocortisone (ANALPRAM-HC) 1-1 % rectal cream Place 1 application rectally 2 (two) times daily.  Marland Kitchen triamterene-hydrochlorothiazide (MAXZIDE-25) 37.5-25 MG tablet TAKE 1 TABLET BY MOUTH DAILY.  . [DISCONTINUED] atorvastatin (LIPITOR) 80 MG tablet Take 1 tablet (80 mg total) by mouth daily.  . [DISCONTINUED] doxycycline (VIBRA-TABS) 100 MG tablet Take 1 tablet (100 mg total) by mouth 2 (two) times daily.   No facility-administered encounter medications on file as of 06/19/2018.     Review of Systems  Constitutional: Negative for appetite change.       Increased weight.    HENT: Negative for congestion and sinus pressure.   Respiratory: Negative for cough, chest tightness and shortness of breath.   Cardiovascular: Negative for chest pain, palpitations and leg swelling.  Gastrointestinal: Positive for constipation. Negative for diarrhea, nausea and vomiting.       Some abdominal discomfort related to constipation.  Resolves when has a bowel movement.    Genitourinary: Negative for difficulty urinating and dysuria.  Musculoskeletal: Positive for back pain. Negative for joint swelling and myalgias.  Skin: Negative for color change and rash.  Neurological: Negative for dizziness, light-headedness and headaches.  Psychiatric/Behavioral: Negative for agitation and dysphoric mood.       Objective:    Physical Exam  Constitutional: He appears well-developed and well-nourished. No distress.  HENT:  Nose: Nose normal.  Mouth/Throat: Oropharynx is clear and moist.  Neck: Neck supple. No thyromegaly present.  Cardiovascular: Normal rate and regular rhythm.  Pulmonary/Chest: Effort normal and breath sounds normal. No respiratory distress.  Abdominal: Soft. Bowel sounds are normal. There is no tenderness.  Musculoskeletal: He exhibits no edema  or tenderness.  Lymphadenopathy:    He has no cervical adenopathy.  Skin: No rash noted. No erythema.  Psychiatric: He has a normal mood and affect. His behavior is normal.    BP 130/80 (BP Location: Left Arm, Patient Position: Sitting, Cuff Size: Large)   Pulse 87   Temp 97.9 F (36.6 C) (Oral)   Resp 18   Wt 224 lb (101.6 kg)   SpO2 98%   BMI 35.08 kg/m  Wt Readings from Last 3 Encounters:  06/19/18 224 lb (101.6 kg)  06/17/18 211 lb (95.7 kg)  04/07/18  216 lb (98 kg)     Lab Results  Component Value Date   WBC 9.1 01/31/2018   HGB 15.3 01/31/2018   HCT 44.0 01/31/2018   PLT 154 01/31/2018   GLUCOSE 108 (H) 01/31/2018   CHOL 197 01/04/2018   TRIG 233.0 (H) 01/04/2018   HDL 36.90 (L) 01/04/2018   LDLDIRECT 134.0 01/04/2018   LDLCALC 123 (H) 02/18/2017   ALT 73 (H) 01/31/2018   AST 58 (H) 01/31/2018   NA 137 01/31/2018   K 3.9 01/31/2018   CL 102 01/31/2018   CREATININE 0.79 01/31/2018   BUN 10 01/31/2018   CO2 24 01/31/2018   TSH 3.59 01/04/2018   PSA 0.43 01/04/2018   HGBA1C 6.3 01/04/2018       Assessment & Plan:   Problem List Items Addressed This Visit    Chronic back pain    Followed by ortho.  Received disability.        GERD (gastroesophageal reflux disease)    On protonix.  No increased acid reflux reported.        Relevant Medications   pantoprazole (PROTONIX) 40 MG tablet   Hypercholesterolemia    Was on lipitor.  Stopped taking.  rx sent in.  Will restart.  Low cholesterol diet and exercise.  Follow lipid panel and liver function tests.  Discussed importance of taking medication regularly.        Relevant Medications   atorvastatin (LIPITOR) 80 MG tablet   Other Relevant Orders   Hepatic function panel   Lipid panel   Hyperglycemia    Low carb diet and exercise.  Follow met b and a1c.        Relevant Orders   Hemoglobin A1c   Hypertension    Blood pressure under good control.  Continue same medication regimen.  Follow  pressures.  Follow metabolic panel.        Relevant Medications   atorvastatin (LIPITOR) 80 MG tablet   Other Relevant Orders   TSH   Basic metabolic panel   Obesity (BMI 30-39.9)    Discussed diet and exercise.  Follow.       Sleep apnea    Has previously been diagnosed with sleep apnea.  Never followed through with treatment.  Recent TIA.  Weight gain.  Hypertension, etc.  Discussed the need for reevaluation and treatment.  Refer to pulmonary.        Relevant Orders   Ambulatory referral to Pulmonology   TIA (transient ischemic attack)    Recently admitted and diagnosed with TIA.  Saw neurology.  Still has not had CTA. Planning to reschedule.  On aspirin.  Continue.  No recurring symptoms.        Relevant Medications   atorvastatin (LIPITOR) 80 MG tablet    Other Visit Diagnoses    Right eye injury, subsequent encounter    -  Primary   Involved eye lid. Bruising.  No vision change.  Declines further evaluation.         Einar Pheasant, MD

## 2018-06-20 ENCOUNTER — Encounter: Payer: Self-pay | Admitting: Internal Medicine

## 2018-06-20 DIAGNOSIS — G4730 Sleep apnea, unspecified: Secondary | ICD-10-CM | POA: Insufficient documentation

## 2018-06-20 NOTE — Assessment & Plan Note (Signed)
Low carb diet and exercise.  Follow met b and a1c.   

## 2018-06-20 NOTE — Assessment & Plan Note (Signed)
On protonix.  No increased acid reflux reported.   

## 2018-06-20 NOTE — Assessment & Plan Note (Signed)
Recently admitted and diagnosed with TIA.  Saw neurology.  Still has not had CTA. Planning to reschedule.  On aspirin.  Continue.  No recurring symptoms.

## 2018-06-20 NOTE — Assessment & Plan Note (Signed)
Has previously been diagnosed with sleep apnea.  Never followed through with treatment.  Recent TIA.  Weight gain.  Hypertension, etc.  Discussed the need for reevaluation and treatment.  Refer to pulmonary.

## 2018-06-20 NOTE — Assessment & Plan Note (Signed)
Blood pressure under good control.  Continue same medication regimen.  Follow pressures.  Follow metabolic panel.   

## 2018-06-20 NOTE — Assessment & Plan Note (Signed)
Was on lipitor.  Stopped taking.  rx sent in.  Will restart.  Low cholesterol diet and exercise.  Follow lipid panel and liver function tests.  Discussed importance of taking medication regularly.

## 2018-06-20 NOTE — Assessment & Plan Note (Signed)
Discussed diet and exercise.  Follow.  

## 2018-06-20 NOTE — Assessment & Plan Note (Signed)
Followed by ortho.  Received disability.

## 2018-06-23 ENCOUNTER — Telehealth: Payer: Self-pay

## 2018-06-23 ENCOUNTER — Other Ambulatory Visit: Payer: Self-pay | Admitting: Internal Medicine

## 2018-06-23 ENCOUNTER — Other Ambulatory Visit: Payer: Self-pay

## 2018-06-23 MED ORDER — METOPROLOL SUCCINATE ER 50 MG PO TB24: ORAL_TABLET | ORAL | 3 refills | Status: DC

## 2018-06-23 MED ORDER — ALLOPURINOL 300 MG PO TABS: 300.0000 mg | ORAL_TABLET | Freq: Every day | ORAL | 2 refills | Status: DC

## 2018-06-23 NOTE — Telephone Encounter (Signed)
Copied from [place] [id number]. Topic: General - Other >> Jun 23, 2018 11:17 AM Carolyn Stare wrote:  Pt said he saw the doctor on Monday and was told his refills would be called in and he has contacted the pharmacy and nothing is there    metoprolol succinate (TOPROL-XL) 50 MG 24 hr tablet     ALLOPURINOL

## 2018-06-23 NOTE — Telephone Encounter (Signed)
rx sent in. Tried to call patient but he does not have voicemail set up to leave a message

## 2018-06-27 ENCOUNTER — Other Ambulatory Visit: Payer: Self-pay

## 2018-06-29 ENCOUNTER — Other Ambulatory Visit (INDEPENDENT_AMBULATORY_CARE_PROVIDER_SITE_OTHER): Payer: Medicare Other

## 2018-06-29 DIAGNOSIS — R739 Hyperglycemia, unspecified: Secondary | ICD-10-CM | POA: Diagnosis not present

## 2018-06-29 DIAGNOSIS — E7800 Pure hypercholesterolemia, unspecified: Secondary | ICD-10-CM

## 2018-06-29 DIAGNOSIS — I10 Essential (primary) hypertension: Secondary | ICD-10-CM | POA: Diagnosis not present

## 2018-06-29 LAB — POCT GLYCOSYLATED HEMOGLOBIN (HGB A1C)
HbA1c POC (<> result, manual entry): 6.2 % (ref 4.0–5.6)
HbA1c, POC (controlled diabetic range): 6.2 % (ref 0.0–7.0)
HbA1c, POC (prediabetic range): 6.2 % (ref 5.7–6.4)
Hemoglobin A1C: 6.2 % — AB (ref 4.0–5.6)

## 2018-06-29 LAB — TSH: TSH: 4.43 u[IU]/mL (ref 0.35–4.50)

## 2018-06-29 NOTE — Addendum Note (Signed)
Addended by: Leeanne Rio on: 06/29/2018 11:54 AM   Modules accepted: Orders

## 2018-06-29 NOTE — Addendum Note (Signed)
Addended by: Arby Barrette on: 06/29/2018 08:35 AM   Modules accepted: Orders

## 2018-06-29 NOTE — Progress Notes (Signed)
Pt came in for fasting labs this morning. Was not able to get enough blood after three attempts to run all test.  Pt usually drinks plenty of fluids, but did not this morning. Pt will need to return for remainder of labs well hydrated. Only TSH & POCT A1C was completed today.

## 2018-06-30 ENCOUNTER — Institutional Professional Consult (permissible substitution): Payer: Self-pay | Admitting: Internal Medicine

## 2018-07-03 ENCOUNTER — Other Ambulatory Visit: Payer: Self-pay

## 2018-07-03 ENCOUNTER — Encounter: Payer: Self-pay | Admitting: Internal Medicine

## 2018-07-04 ENCOUNTER — Other Ambulatory Visit: Payer: Self-pay

## 2018-07-05 ENCOUNTER — Other Ambulatory Visit: Payer: Self-pay

## 2018-07-07 ENCOUNTER — Ambulatory Visit: Payer: Self-pay | Admitting: Family Medicine

## 2018-07-07 DIAGNOSIS — M545 Low back pain: Secondary | ICD-10-CM | POA: Diagnosis not present

## 2018-07-18 DIAGNOSIS — M545 Low back pain: Secondary | ICD-10-CM | POA: Diagnosis not present

## 2018-07-20 DIAGNOSIS — M2392 Unspecified internal derangement of left knee: Secondary | ICD-10-CM | POA: Diagnosis not present

## 2018-07-20 DIAGNOSIS — S39012A Strain of muscle, fascia and tendon of lower back, initial encounter: Secondary | ICD-10-CM | POA: Diagnosis not present

## 2018-07-20 DIAGNOSIS — M961 Postlaminectomy syndrome, not elsewhere classified: Secondary | ICD-10-CM | POA: Diagnosis not present

## 2018-07-20 DIAGNOSIS — M25562 Pain in left knee: Secondary | ICD-10-CM | POA: Diagnosis not present

## 2018-07-24 ENCOUNTER — Other Ambulatory Visit: Payer: Self-pay | Admitting: Unknown Physician Specialty

## 2018-07-24 DIAGNOSIS — M2392 Unspecified internal derangement of left knee: Secondary | ICD-10-CM

## 2018-07-29 ENCOUNTER — Telehealth: Payer: Self-pay | Admitting: Internal Medicine

## 2018-07-31 NOTE — Telephone Encounter (Signed)
Pt called stating he needs a refill for medication triamterene-hydrochlorothiazide (MAXZIDE-25) 37.5-25 MG tablet. Pt does not have anymore left. Pt states both of his hands are swollen. Pt took last pill on 09/14. Please advise?  Pharmacy is [id number] - [place], [address]  Call pt @ [phone]. Thank you!

## 2018-07-31 NOTE — Telephone Encounter (Signed)
rx sent in 

## 2018-08-03 NOTE — Progress Notes (Deleted)
NEUROLOGY FOLLOW UP OFFICE NOTE  Cameron Sanchez [id number]  HISTORY OF PRESENT ILLNESS: Cameron Sanchez is a 47 year old right-handed male with hypercholesterolemia, hypertension, chronic back pain and migraines who follows up for TIA.  UPDATE: Echocardiogram from 03/01/18 showed EF 55-60% 24 hour Holter Monitor showed normal sinus rhythm with rare PVCs and no significant arrhythmias such as atrial fibrillation. Hgb A1c from 06/29/18 was 6.2 CTA of head and neck not performed.  He is taking ASA 325mg  daily and atorvastatin ***  HISTORY: On 01/30/18, he was sitting in his recliner when he suddenly felt numbness of his right arm and leg.  He also had trouble getting words out.  He did not have associated facial droop or unilateral weakness.  He noted a left sided headache.  Symptoms lasted about 60 minutes.  He was brought by EMS to the ED at [facility] for further evaluation and treatment.  CT of head was unremarkable.  MRI of brain with and without contrast showed mild nonspecific cerebral white matter changes but no acute stroke, bleed or mass lesion.  He deferred admission for observation, so he was advised to start aspirin 81mg  daily.  He has history of mild headaches once in a while but denies migraines.  He has history of hypertension (on medication) as well as hypercholesterolemia.  He was previously on statin therapy but was taken off due to cost and never restarted.  Heart disease runs in his family.  PAST MEDICAL HISTORY: Past Medical History:  Diagnosis Date  . Allergy   . Chronic back pain   . Hypercholesterolemia   . Hypertension   . Migraines     MEDICATIONS: Current Outpatient Medications on File Prior to Visit  Medication Sig Dispense Refill  . allopurinol (ZYLOPRIM) 300 MG tablet Take 1 tablet (300 mg total) by mouth daily. 30 tablet 2  . aspirin EC 81 MG tablet Take 81 mg by mouth daily.    Marland Kitchen atorvastatin (LIPITOR) 80 MG tablet Take 1 tablet  (80 mg total) by mouth daily. 30 tablet 0  . carisoprodol (SOMA) 350 MG tablet Take 350 mg by mouth 4 (four) times daily as needed for muscle spasms.     . colchicine 0.6 MG tablet Take 1 tablet (0.6 mg total) by mouth 2 (two) times daily. 60 tablet 0  . metoprolol succinate (TOPROL-XL) 50 MG 24 hr tablet TAKE 2 TABLETS BY MOUTH DAILY WITH OR IMMEDIATELY FOLLOWING A MEAL. 60 tablet 3  . Oxycodone HCl 10 MG TABS Take 1 tablet by mouth 3 (three) times daily.  0  . pantoprazole (PROTONIX) 40 MG tablet Take 40 mg by mouth daily.  1  . pramoxine-hydrocortisone (ANALPRAM-HC) 1-1 % rectal cream Place 1 application rectally 2 (two) times daily. 30 g 0  . triamterene-hydrochlorothiazide (MAXZIDE-25) 37.5-25 MG tablet TAKE 1 TABLET BY MOUTH DAILY. 30 tablet 2   No current facility-administered medications on file prior to visit.     ALLERGIES: No Known Allergies  FAMILY HISTORY: Family History  Problem Relation Age of Onset  . Cancer Father        germ cell (retroperitoneal)  . Coronary artery disease Unknown        s/p stent (age 40)  . Hypertension Paternal Grandfather   . Kidney cancer Paternal Grandfather   . Diabetes Paternal Grandfather   . Diabetes Maternal Grandfather   . Heart disease Cousin        MI - age 43  SOCIAL HISTORY: Social History   Socioeconomic History  . Marital status: Significant Other    Spouse name: Not on file  . Number of children: 2  . Years of education: 56  . Highest education level: Not on file  Occupational History  . Occupation: disabled    Employer: [employer]  Social Needs  . Financial resource strain: Not on file  . Food insecurity:    Worry: Not on file    Inability: Not on file  . Transportation needs:    Medical: Not on file    Non-medical: Not on file  Tobacco Use  . Smoking status: Never Smoker  . Smokeless tobacco: Never Used  Substance and Sexual Activity  . Alcohol use: No    Alcohol/week: 0.0 standard drinks  .  Drug use: No  . Sexual activity: Not on file  Lifestyle  . Physical activity:    Days per week: Not on file    Minutes per session: Not on file  . Stress: Not on file  Relationships  . Social connections:    Talks on phone: Not on file    Gets together: Not on file    Attends religious service: Not on file    Active member of club or organization: Not on file    Attends meetings of clubs or organizations: Not on file    Relationship status: Not on file  . Intimate partner violence:    Fear of current or ex partner: Not on file    Emotionally abused: Not on file    Physically abused: Not on file    Forced sexual activity: Not on file  Other Topics Concern  . Not on file  Social History Narrative   Lives with girlfriend in a one story home.  Has 2 sons.  Trying to get disability due to several back issues.  Education: high school.     REVIEW OF SYSTEMS: Constitutional: No fevers, chills, or sweats, no generalized fatigue, change in appetite Eyes: No visual changes, double vision, eye pain Ear, nose and throat: No hearing loss, ear pain, nasal congestion, sore throat Cardiovascular: No chest pain, palpitations Respiratory:  No shortness of breath at rest or with exertion, wheezes GastrointestinaI: No nausea, vomiting, diarrhea, abdominal pain, fecal incontinence Genitourinary:  No dysuria, urinary retention or frequency Musculoskeletal:  No neck pain, back pain Integumentary: No rash, pruritus, skin lesions Neurological: as above Psychiatric: No depression, insomnia, anxiety Endocrine: No palpitations, fatigue, diaphoresis, mood swings, change in appetite, change in weight, increased thirst Hematologic/Lymphatic:  No purpura, petechiae. Allergic/Immunologic: no itchy/runny eyes, nasal congestion, recent allergic reactions, rashes  PHYSICAL EXAM: *** General: No acute distress.  Patient appears ***-groomed.  *** body habitus. Head:  Normocephalic/atraumatic Eyes:  Fundi  examined but not visualized Neck: supple, no paraspinal tenderness, full range of motion Heart:  Regular rate and rhythm Lungs:  Clear to auscultation bilaterally Back: No paraspinal tenderness Neurological Exam: alert and oriented to person, place, and time. Attention span and concentration intact, recent and remote memory intact, fund of knowledge intact.  Speech fluent and not dysarthric, language intact.  CN II-XII intact. Bulk and tone normal, muscle strength 5/5 throughout.  Sensation to light touch, temperature and vibration intact.  Deep tendon reflexes 2+ throughout, toes downgoing.  Finger to nose and heel to shin testing intact.  Gait normal, Romberg negative.  IMPRESSION: ***  PLAN: ***  Metta Clines, DO  CC: ***

## 2018-08-04 ENCOUNTER — Ambulatory Visit: Payer: Medicare Other

## 2018-08-04 ENCOUNTER — Ambulatory Visit: Payer: Self-pay | Admitting: Neurology

## 2018-08-16 ENCOUNTER — Ambulatory Visit
Admission: RE | Admit: 2018-08-16 | Discharge: 2018-08-16 | Disposition: A | Discharge status: Home / Self Care | Payer: Medicare Other | Source: Ambulatory Visit | Attending: Unknown Physician Specialty | Admitting: Unknown Physician Specialty

## 2018-08-16 DIAGNOSIS — M2392 Unspecified internal derangement of left knee: Secondary | ICD-10-CM | POA: Insufficient documentation

## 2018-08-16 DIAGNOSIS — R600 Localized edema: Secondary | ICD-10-CM | POA: Diagnosis not present

## 2018-08-16 DIAGNOSIS — M94262 Chondromalacia, left knee: Secondary | ICD-10-CM | POA: Diagnosis not present

## 2018-09-03 IMAGING — CT CT HEAD W/O CM
3 series · 16 of 47 positions shown, 19 images · non-contrast
Comparison: None.

CLINICAL DATA: Dizziness and numbness

EXAM:
CT HEAD WITHOUT CONTRAST
TECHNIQUE: Contiguous axial images were obtained from the base of the skull
through the vertex without intravenous contrast.

[Series 2: head wo · axial · 0.43mm/px · z∈[+302,+432]mm · 10 of 32 slices shown, 13 images]
[im 3/32  brain]
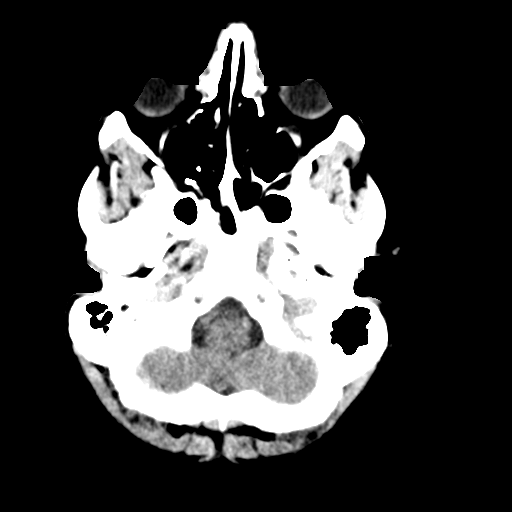
[im 3/32  bone]
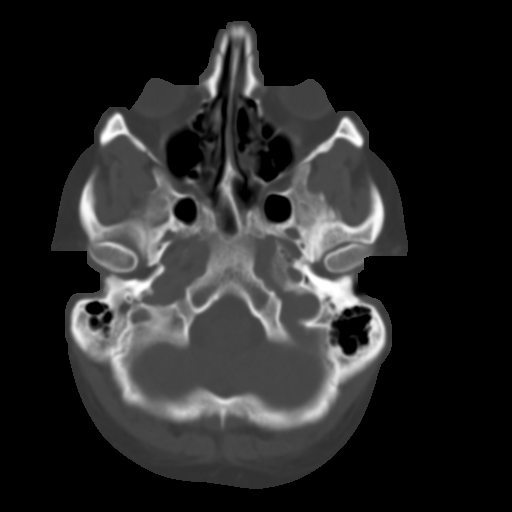
[im 6/32  brain]
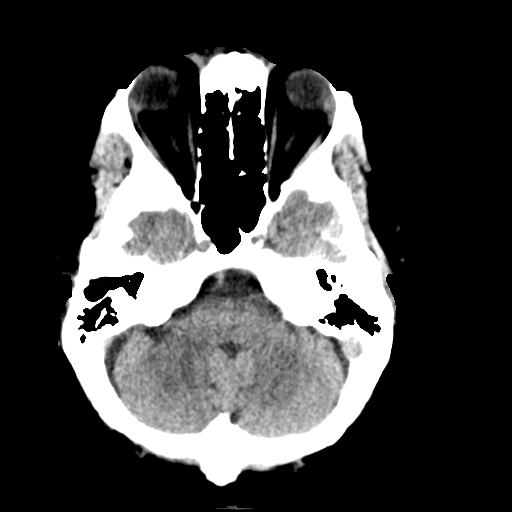
[im 9/32  brain]
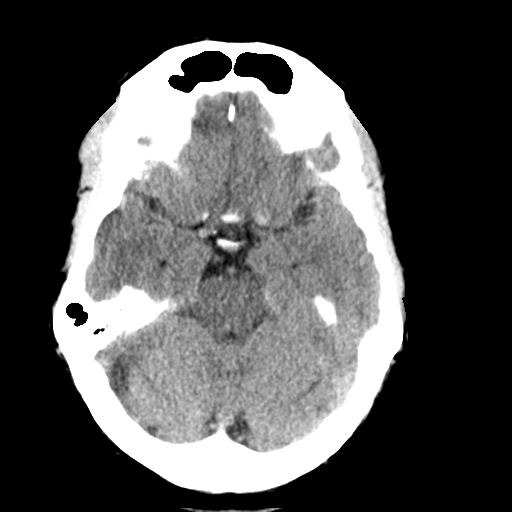
[im 11/32  brain]
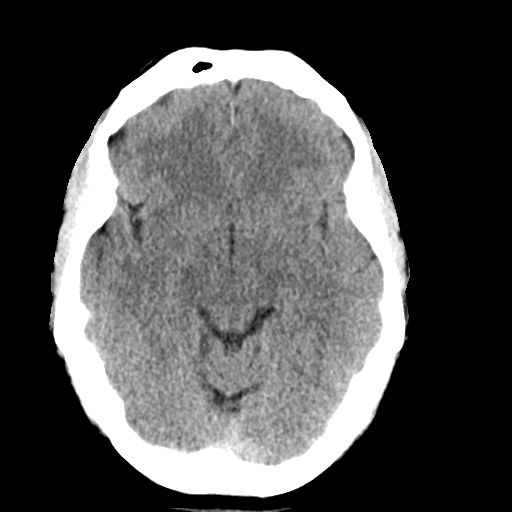
[im 14/32  brain]
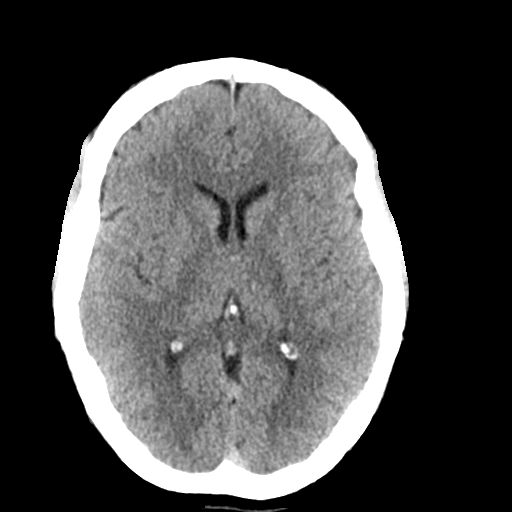
[im 14/32  bone]
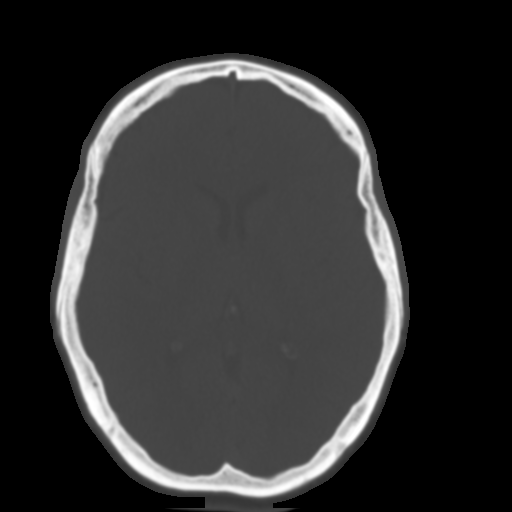
[im 18/32  brain]
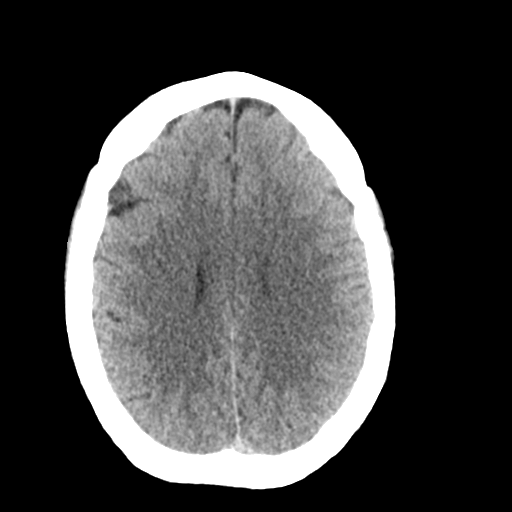
[im 21/32  brain]
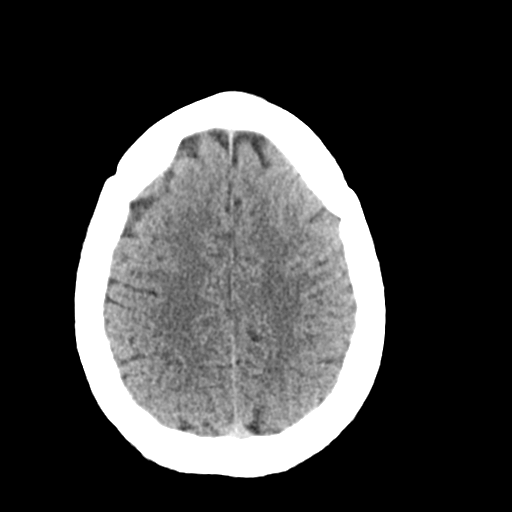
[im 24/32  brain]
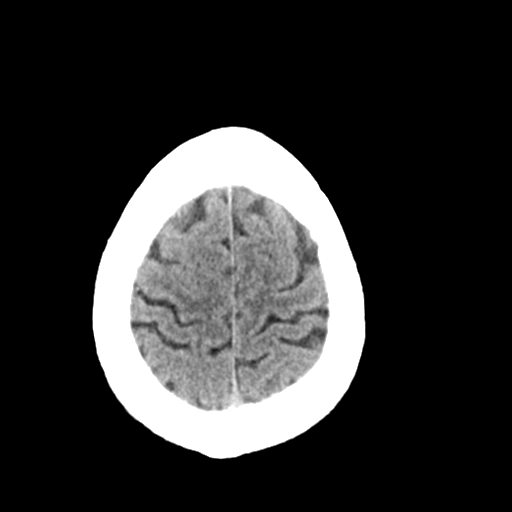
[im 26/32  brain]
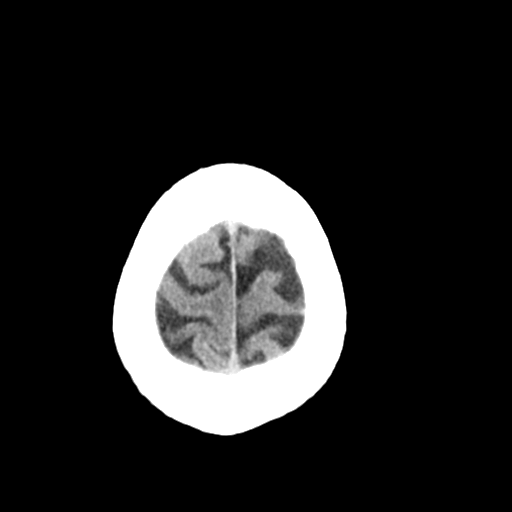
[im 26/32  bone]
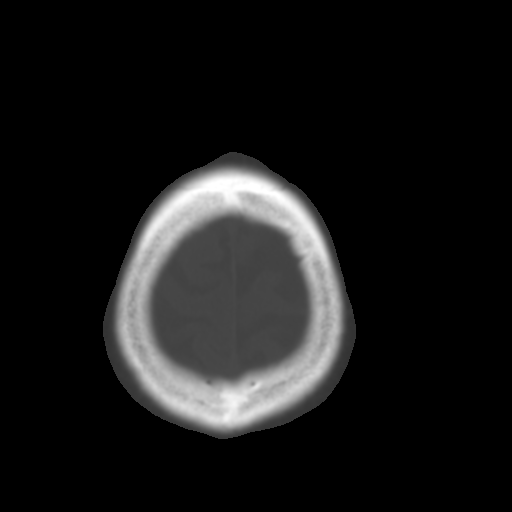
[im 29/32  brain]
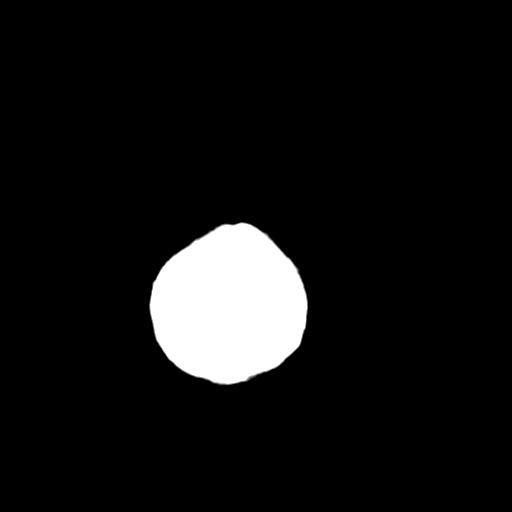

[Series 4: coronal soft tissue · coronal · 0.31mm/px · 3 of 67 slices shown]
[im 23/67  brain]
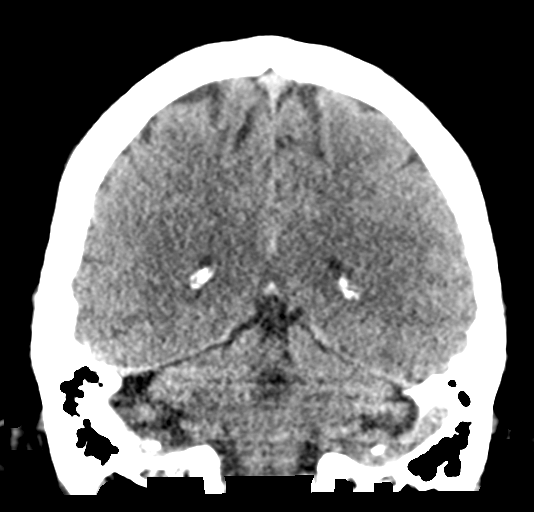
[im 30/67  brain]
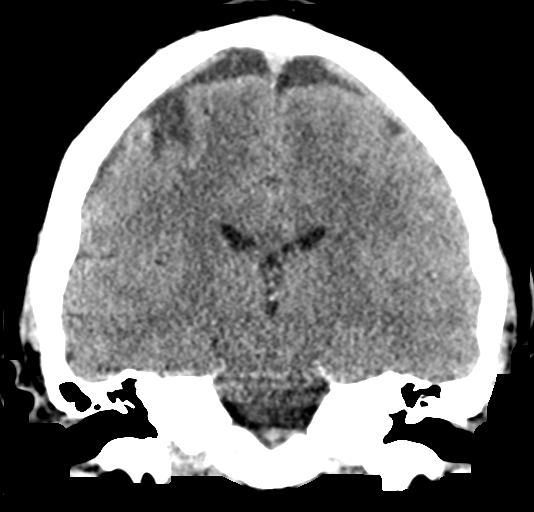
[im 37/67  brain]
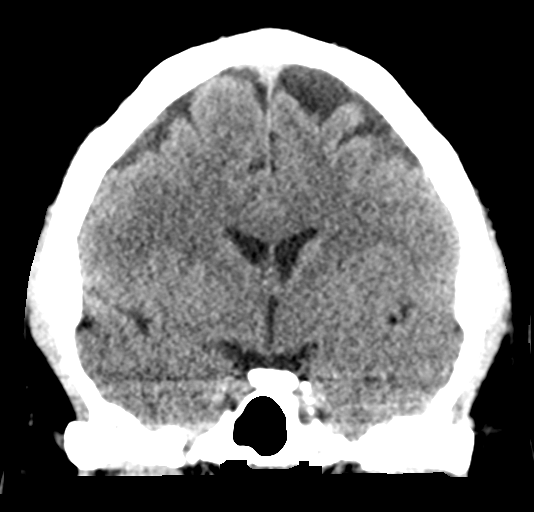

[Series 5: sagittal soft tissue · sagittal · 0.31mm/px · 3 of 56 slices shown]
[im 19/56  brain]
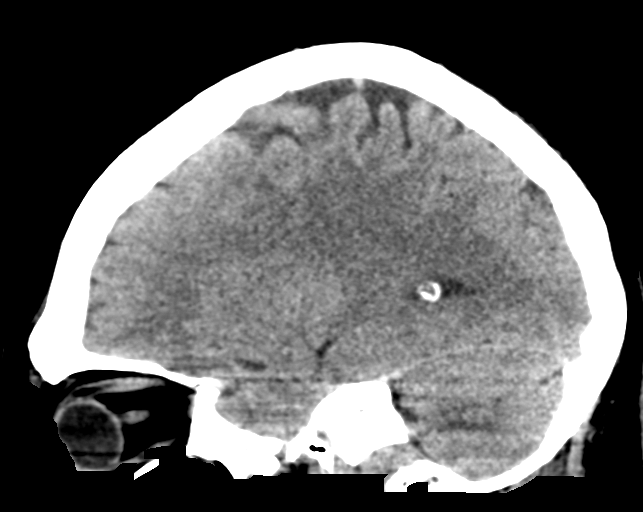
[im 28/56  brain]
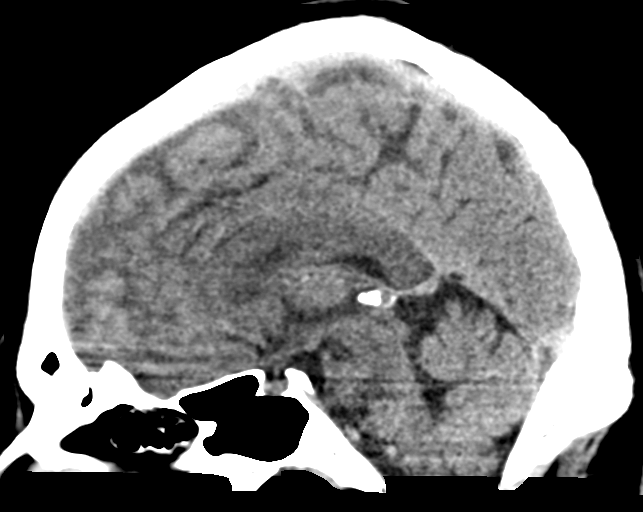
[im 37/56  brain]
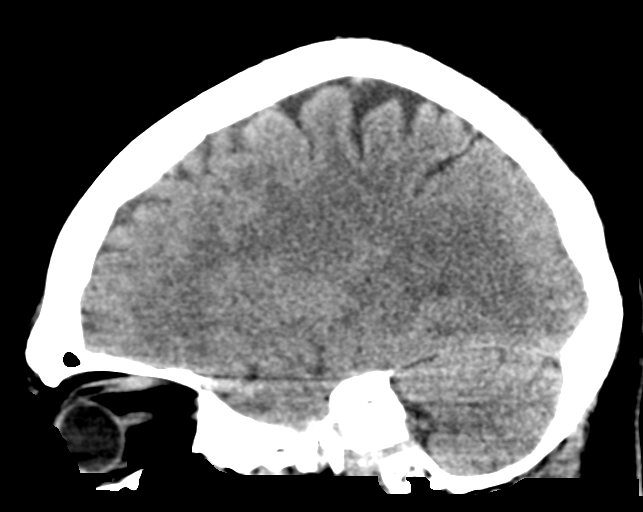

[16 of 47 positions shown; findings below may reference images not displayed]

FINDINGS: Brain: No acute territorial infarction, hemorrhage or intracranial
mass is visualized. The ventricles are nonenlarged.

Vascular: No hyperdense vessels. Scattered calcifications at the
carotid siphons. Vertebral artery calcification.

Skull: Normal. Negative for fracture or focal lesion.

Sinuses/Orbits: No acute finding.

Other: None
IMPRESSION: Negative non contrasted CT appearance of the brain.

## 2018-09-16 ENCOUNTER — Other Ambulatory Visit: Payer: Self-pay | Admitting: Internal Medicine

## 2018-09-29 ENCOUNTER — Telehealth: Payer: Self-pay

## 2018-09-29 ENCOUNTER — Ambulatory Visit (INDEPENDENT_AMBULATORY_CARE_PROVIDER_SITE_OTHER): Payer: Medicare Other | Admitting: Internal Medicine

## 2018-09-29 VITALS — BP 126/78 | HR 79 | Temp 98.0°F | Resp 18 | Wt 220.4 lb

## 2018-09-29 DIAGNOSIS — R739 Hyperglycemia, unspecified: Secondary | ICD-10-CM

## 2018-09-29 DIAGNOSIS — K219 Gastro-esophageal reflux disease without esophagitis: Secondary | ICD-10-CM | POA: Diagnosis not present

## 2018-09-29 DIAGNOSIS — G4730 Sleep apnea, unspecified: Secondary | ICD-10-CM | POA: Diagnosis not present

## 2018-09-29 DIAGNOSIS — E7800 Pure hypercholesterolemia, unspecified: Secondary | ICD-10-CM | POA: Diagnosis not present

## 2018-09-29 DIAGNOSIS — I10 Essential (primary) hypertension: Secondary | ICD-10-CM

## 2018-09-29 DIAGNOSIS — M109 Gout, unspecified: Secondary | ICD-10-CM | POA: Diagnosis not present

## 2018-09-29 DIAGNOSIS — G8929 Other chronic pain: Secondary | ICD-10-CM

## 2018-09-29 DIAGNOSIS — L918 Other hypertrophic disorders of the skin: Secondary | ICD-10-CM | POA: Diagnosis not present

## 2018-09-29 DIAGNOSIS — M545 Low back pain, unspecified: Secondary | ICD-10-CM

## 2018-09-29 DIAGNOSIS — E669 Obesity, unspecified: Secondary | ICD-10-CM

## 2018-09-29 MED ORDER — PANTOPRAZOLE SODIUM 40 MG PO TBEC: 40.0000 mg | DELAYED_RELEASE_TABLET | Freq: Two times a day (BID) | ORAL | 2 refills | Status: DC

## 2018-09-29 NOTE — Progress Notes (Signed)
[name], male   DOB: 1971/08/07, 47 y.o.   MRN: [id number]   Subjective:    [name], male    DOB: 1971-09-18, 47 y.o.   MRN: [id number]  HPI  Patient here for a scheduled follow up.  He reports that he is having problems with increased acid reflux.  Worse recently.  On protonix.  Taking daily.  A few days ago, he had an episode where acid came up in his mouth.  Drank milk and took [place].  Sleeps with his head up.  Tries to eat early.  Trying to avoid greasy foods.  No chest pain with increased activity or exertion.  Breathing is stable.  No abdominal pain.  Bowels moving.  Has known sleep apnea. Never treated.  Sleep study was years ago.  Was referred to pulmonary.  appt never made.  He is in agreement.  Also has multiple skin tags.  He request dermatology referral to have removed.     Past Medical History:  Diagnosis Date  . Allergy   . Chronic back pain   . Hypercholesterolemia   . Hypertension   . Migraines    Past Surgical History:  Procedure Laterality Date  . BACK SURGERY  08/12/14   multiple  . CHOLECYSTECTOMY    . KNEE ARTHROSCOPY     Dr HBK   Family History  Problem Relation Age of Onset  . Cancer Father        germ cell (retroperitoneal)  . Coronary artery disease Unknown        s/p stent (age 20)  . Hypertension Paternal Grandfather   . Kidney cancer Paternal Grandfather   . Diabetes Paternal Grandfather   . Diabetes Maternal Grandfather   . Heart disease Cousin        MI - age 29   Social History   Socioeconomic History  . Marital status: Significant Other    Spouse name: Not on file  . Number of children: 2  . Years of education: 63  . Highest education level: Not on file  Occupational History  . Occupation: disabled    Employer: [employer]  Social Needs  . Financial resource strain: Not on file  . Food insecurity:    Worry: Not on file    Inability: Not on file  . Transportation needs:    Medical: Not  on file    Non-medical: Not on file  Tobacco Use  . Smoking status: Never Smoker  . Smokeless tobacco: Never Used  Substance and Sexual Activity  . Alcohol use: No    Alcohol/week: 0.0 standard drinks  . Drug use: No  . Sexual activity: Not on file  Lifestyle  . Physical activity:    Days per week: Not on file    Minutes per session: Not on file  . Stress: Not on file  Relationships  . Social connections:    Talks on phone: Not on file    Gets together: Not on file    Attends religious service: Not on file    Active member of club or organization: Not on file    Attends meetings of clubs or organizations: Not on file    Relationship status: Not on file  Other Topics Concern  . Not on file  Social History Narrative   Lives with girlfriend in a one story home.  Has 2 sons.  Trying to get disability due to several back issues.  Education: high school.  Outpatient Encounter Medications as of 09/29/2018  Medication Sig  . allopurinol (ZYLOPRIM) 300 MG tablet TAKE 1 TABLET BY MOUTH EVERY DAY  . aspirin EC 81 MG tablet Take 81 mg by mouth daily.  Marland Kitchen atorvastatin (LIPITOR) 80 MG tablet Take 1 tablet (80 mg total) by mouth daily.  . carisoprodol (SOMA) 350 MG tablet Take 350 mg by mouth 4 (four) times daily as needed for muscle spasms.   . colchicine 0.6 MG tablet Take 1 tablet (0.6 mg total) by mouth 2 (two) times daily.  . metoprolol succinate (TOPROL-XL) 50 MG 24 hr tablet TAKE 2 TABLETS BY MOUTH DAILY WITH OR IMMEDIATELY FOLLOWING A MEAL.  . pantoprazole (PROTONIX) 40 MG tablet Take 1 tablet (40 mg total) by mouth 2 (two) times daily before a meal.  . triamterene-hydrochlorothiazide (MAXZIDE-25) 37.5-25 MG tablet TAKE 1 TABLET BY MOUTH DAILY.  . [DISCONTINUED] Oxycodone HCl 10 MG TABS Take 1 tablet by mouth 3 (three) times daily.  . [DISCONTINUED] pantoprazole (PROTONIX) 40 MG tablet Take 40 mg by mouth daily.  . [DISCONTINUED] pramoxine-hydrocortisone (ANALPRAM-HC) 1-1 % rectal  cream Place 1 application rectally 2 (two) times daily.   No facility-administered encounter medications on file as of 09/29/2018.     Review of Systems  Constitutional: Negative for appetite change and unexpected weight change.  HENT: Negative for congestion and sinus pressure.   Respiratory: Negative for cough, chest tightness and shortness of breath.   Cardiovascular: Negative for chest pain, palpitations and leg swelling.  Gastrointestinal: Negative for abdominal pain, diarrhea, nausea and vomiting.       Reports acid reflux as outlined.  Worsened recently.    Genitourinary: Negative for difficulty urinating and dysuria.  Musculoskeletal: Negative for joint swelling and myalgias.  Skin: Negative for color change and rash.  Neurological: Negative for dizziness, light-headedness and headaches.  Psychiatric/Behavioral: Negative for agitation and dysphoric mood.       Objective:    Physical Exam  Constitutional: He appears well-developed and well-nourished. No distress.  HENT:  Nose: Nose normal.  Mouth/Throat: Oropharynx is clear and moist.  Neck: Neck supple.  Cardiovascular: Normal rate and regular rhythm.  Pulmonary/Chest: Effort normal and breath sounds normal. No respiratory distress.  Abdominal: Soft. Bowel sounds are normal. There is no tenderness.  Musculoskeletal: He exhibits no edema or tenderness.  Lymphadenopathy:    He has no cervical adenopathy.  Skin: No rash noted. No erythema.  Psychiatric: He has a normal mood and affect. His behavior is normal.    BP 126/78 (BP Location: Left Arm, Patient Position: Sitting, Cuff Size: Normal)   Pulse 79   Temp 98 F (36.7 C) (Oral)   Resp 18   Wt 220 lb 6.4 oz (100 kg)   SpO2 98%   BMI 34.52 kg/m  Wt Readings from Last 3 Encounters:  09/29/18 220 lb 6.4 oz (100 kg)  06/19/18 224 lb (101.6 kg)  06/17/18 211 lb (95.7 kg)     Lab Results  Component Value Date   WBC 9.1 01/31/2018   HGB 15.3 01/31/2018   HCT  44.0 01/31/2018   PLT 154 01/31/2018   GLUCOSE 108 (H) 01/31/2018   CHOL 197 01/04/2018   TRIG 233.0 (H) 01/04/2018   HDL 36.90 (L) 01/04/2018   LDLDIRECT 134.0 01/04/2018   LDLCALC 123 (H) 02/18/2017   ALT 73 (H) 01/31/2018   AST 58 (H) 01/31/2018   NA 137 01/31/2018   K 3.9 01/31/2018   CL 102 01/31/2018   CREATININE 0.79  01/31/2018   BUN 10 01/31/2018   CO2 24 01/31/2018   TSH 4.43 06/29/2018   PSA 0.43 01/04/2018   HGBA1C 6.2 (A) 06/29/2018   HGBA1C 6.2 06/29/2018   HGBA1C 6.2 06/29/2018   HGBA1C 6.2 06/29/2018    Mr Knee Left Wo Contrast  Result Date: 08/17/2018 CLINICAL DATA:  Left knee instability. Mass along the anterior aspect. EXAM: MRI OF THE LEFT KNEE WITHOUT CONTRAST TECHNIQUE: Multiplanar, multisequence MR imaging of the knee was performed. No intravenous contrast was administered. COMPARISON:  None. FINDINGS: MENISCI Medial meniscus:  Intact. Lateral meniscus:  Intact. LIGAMENTS Cruciates:  Intact ACL and PCL. Collaterals: Medial collateral ligament is intact. Lateral collateral ligament complex is intact. 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 Medial:  No chondral defect. Lateral:  No chondral defect. Joint: Small joint effusion. Edema in Hoffa's fat. Prominent medial patellar plica extending into the medial patellofemoral joint space. Popliteal Fossa:  No Baker cyst. Intact popliteus tendon. Extensor Mechanism: Intact quadriceps tendon. Intact patellar tendon. Intact medial patellar retinaculum. Intact lateral patellar retinaculum. Intact MPFL. Bones:  No acute osseous abnormality.  No aggressive osseous lesion. Other: No fluid collection or hematoma. IMPRESSION: 1. 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 2. No ligamentous or meniscal injury of left knee. Electronically  Signed   By: Kathreen Devoid   On: 08/17/2018 08:39       Assessment & Plan:   Problem List Items Addressed This Visit    Chronic back pain - Primary    Followed by ortho.        GERD (gastroesophageal reflux disease)    On protonix.  Continues to have increased symptoms.  Will increase protonix to bid.  Refer to GI for evaluation and question of need for EGD.        Relevant Medications   pantoprazole (PROTONIX) 40 MG tablet   Gout    No reported flares since last visit.  Follow.        Hypercholesterolemia    On lipiitor.  Low cholesterol diet and exercise.  Follow lipid panel and liver function tests.        Relevant Orders   Hepatic function panel   Lipid panel   Hyperglycemia    Low carb diet and exercise.  Follow met b and a1c.        Relevant Orders   Hemoglobin A1c   Hypertension    Blood pressure under good control.  Continue same medication regimen.  Follow pressures.  Follow metabolic panel.        Relevant Orders   Basic metabolic panel   Obesity (BMI 30-39.9)    Discussed diet and exercise.  Follow.        Sleep apnea    Has previously been diagnosed with sleep apnea.  Never treated.  Has been years since had sleep study.  Refer to pulmonary for evaluation and treatment - sleep apnea.         Other Visit Diagnoses    Cutaneous skin tags       Request dermatology referral.     Relevant Orders   Ambulatory referral to Dermatology       Einar Pheasant, MD

## 2018-09-29 NOTE — Telephone Encounter (Signed)
He wants his dermatology referral to go to [facility]

## 2018-09-29 NOTE — Telephone Encounter (Signed)
Copied from [place] [phone]. Topic: Referral - Request for Referral >> Sep 29, 2018  3:48 PM Rutherford Nail, [place] wrote: Has patient seen PCP for this complaint? Yes.   *If NO, is insurance requiring patient see PCP for this issue before PCP can refer them? Referral for which specialty: Dermatology Preferred provider/office: [facility] (t) [phone] Reason for referral: Discussed with Dr Nicki Reaper at visit on 09/29/18

## 2018-10-01 ENCOUNTER — Encounter: Payer: Self-pay | Admitting: Internal Medicine

## 2018-10-01 NOTE — Assessment & Plan Note (Signed)
No reported flares since last visit.  Follow.

## 2018-10-01 NOTE — Assessment & Plan Note (Signed)
Discussed diet and exercise.  Follow.  

## 2018-10-01 NOTE — Assessment & Plan Note (Signed)
On lipiitor.  Low cholesterol diet and exercise.  Follow lipid panel and liver function tests.

## 2018-10-01 NOTE — Assessment & Plan Note (Signed)
Blood pressure under good control.  Continue same medication regimen.  Follow pressures.  Follow metabolic panel.   

## 2018-10-01 NOTE — Assessment & Plan Note (Signed)
Low carb diet and exercise.  Follow met b and a1c.   

## 2018-10-01 NOTE — Assessment & Plan Note (Signed)
Has previously been diagnosed with sleep apnea.  Never treated.  Has been years since had sleep study.  Refer to pulmonary for evaluation and treatment - sleep apnea.

## 2018-10-01 NOTE — Assessment & Plan Note (Signed)
On protonix.  Continues to have increased symptoms.  Will increase protonix to bid.  Refer to GI for evaluation and question of need for EGD.

## 2018-10-01 NOTE — Telephone Encounter (Signed)
Order placed for dermatology referral.  

## 2018-10-01 NOTE — Assessment & Plan Note (Signed)
Followed by ortho 

## 2018-10-03 ENCOUNTER — Other Ambulatory Visit (INDEPENDENT_AMBULATORY_CARE_PROVIDER_SITE_OTHER): Payer: Medicare Other

## 2018-10-03 DIAGNOSIS — R739 Hyperglycemia, unspecified: Secondary | ICD-10-CM

## 2018-10-03 DIAGNOSIS — I10 Essential (primary) hypertension: Secondary | ICD-10-CM | POA: Diagnosis not present

## 2018-10-03 DIAGNOSIS — E7800 Pure hypercholesterolemia, unspecified: Secondary | ICD-10-CM

## 2018-10-03 LAB — LIPID PANEL
Cholesterol: 194 mg/dL (ref 0–200)
HDL: 39.5 mg/dL (ref >39.00)
NonHDL: 154.16
Total CHOL/HDL Ratio: 5
Triglycerides: 266 mg/dL — ABNORMAL HIGH (ref 0.0–149.0)
VLDL: 53.2 mg/dL — ABNORMAL HIGH (ref 0.0–40.0)

## 2018-10-03 LAB — BASIC METABOLIC PANEL
BUN: 9 mg/dL (ref 6–23)
CO2: 23 mEq/L (ref 19–32)
Calcium: 9.8 mg/dL (ref 8.4–10.5)
Chloride: 101 mEq/L (ref 96–112)
Creatinine, Ser: 0.91 mg/dL (ref 0.40–1.50)
GFR: 93.96 mL/min (ref >60.00)
Glucose, Bld: 114 mg/dL — ABNORMAL HIGH (ref 70–99)
Potassium: 4.1 mEq/L (ref 3.5–5.1)
Sodium: 138 mEq/L (ref 135–145)

## 2018-10-03 LAB — HEPATIC FUNCTION PANEL
ALT: 39 U/L (ref 0–53)
AST: 48 U/L — ABNORMAL HIGH (ref 0–37)
Albumin: 4.4 g/dL (ref 3.5–5.2)
Alkaline Phosphatase: 72 U/L (ref 39–117)
Bilirubin, Direct: 0.1 mg/dL (ref 0.0–0.3)
Total Bilirubin: 0.8 mg/dL (ref 0.2–1.2)
Total Protein: 7 g/dL (ref 6.0–8.3)

## 2018-10-03 LAB — HEMOGLOBIN A1C: Hgb A1c MFr Bld: 6.6 % — ABNORMAL HIGH (ref 4.6–6.5)

## 2018-10-03 LAB — LDL CHOLESTEROL, DIRECT: Direct LDL: 127 mg/dL

## 2018-10-04 ENCOUNTER — Other Ambulatory Visit: Payer: Self-pay | Admitting: Internal Medicine

## 2018-10-09 DIAGNOSIS — D2261 Melanocytic nevi of right upper limb, including shoulder: Secondary | ICD-10-CM | POA: Diagnosis not present

## 2018-10-09 DIAGNOSIS — L83 Acanthosis nigricans: Secondary | ICD-10-CM | POA: Diagnosis not present

## 2018-10-09 DIAGNOSIS — D225 Melanocytic nevi of trunk: Secondary | ICD-10-CM | POA: Diagnosis not present

## 2018-10-09 DIAGNOSIS — D2262 Melanocytic nevi of left upper limb, including shoulder: Secondary | ICD-10-CM | POA: Diagnosis not present

## 2018-10-10 ENCOUNTER — Other Ambulatory Visit: Payer: Self-pay | Admitting: Internal Medicine

## 2018-10-10 ENCOUNTER — Other Ambulatory Visit: Payer: Self-pay

## 2018-10-10 DIAGNOSIS — E1149 Type 2 diabetes mellitus with other diabetic neurological complication: Secondary | ICD-10-CM

## 2018-10-10 DIAGNOSIS — E7800 Pure hypercholesterolemia, unspecified: Secondary | ICD-10-CM

## 2018-10-10 MED ORDER — ATORVASTATIN CALCIUM 40 MG PO TABS: 40.0000 mg | ORAL_TABLET | Freq: Every day | ORAL | 2 refills | Status: DC

## 2018-10-10 NOTE — Progress Notes (Signed)
Order placed for f/u liver panel.  

## 2018-10-10 NOTE — Progress Notes (Signed)
rx sent in for lipitor 40mg  q day #30 with 2 refills.  Also placed order for referral to [facility].

## 2018-10-11 DIAGNOSIS — M222X2 Patellofemoral disorders, left knee: Secondary | ICD-10-CM | POA: Diagnosis not present

## 2018-10-11 DIAGNOSIS — M961 Postlaminectomy syndrome, not elsewhere classified: Secondary | ICD-10-CM | POA: Diagnosis not present

## 2018-10-11 DIAGNOSIS — S39012D Strain of muscle, fascia and tendon of lower back, subsequent encounter: Secondary | ICD-10-CM | POA: Diagnosis not present

## 2018-10-15 ENCOUNTER — Other Ambulatory Visit: Payer: Self-pay | Admitting: Internal Medicine

## 2018-11-10 ENCOUNTER — Other Ambulatory Visit: Payer: Self-pay

## 2018-11-17 ENCOUNTER — Institutional Professional Consult (permissible substitution): Payer: Self-pay | Admitting: Internal Medicine

## 2018-11-20 ENCOUNTER — Institutional Professional Consult (permissible substitution): Payer: Self-pay | Admitting: Internal Medicine

## 2018-11-22 ENCOUNTER — Ambulatory Visit (INDEPENDENT_AMBULATORY_CARE_PROVIDER_SITE_OTHER): Payer: Medicare Other | Admitting: Internal Medicine

## 2018-11-22 ENCOUNTER — Encounter: Payer: Self-pay | Admitting: Internal Medicine

## 2018-11-22 VITALS — BP 122/80 | HR 76 | Ht 67.0 in | Wt 221.4 lb

## 2018-11-22 DIAGNOSIS — G4733 Obstructive sleep apnea (adult) (pediatric): Secondary | ICD-10-CM | POA: Diagnosis not present

## 2018-11-22 NOTE — Addendum Note (Signed)
Addended by: Maryanna Shape A on: 11/22/2018 12:18 PM   Modules accepted: Orders

## 2018-11-22 NOTE — Progress Notes (Signed)
Name: Cameron Sanchez MRN: [id number] DOB: [birth year]     CONSULTATION DATE: 1.8.2020 REFERRING MD : scott  CHIEF COMPLAINT: excessive daytime sleepiness   HISTORY OF PRESENT ILLNESS: 48 yo pleasant white male seen today for severe excessive daytime sleepiness  seen today for problems with sleep Patient  has been having sleep problems for many years Patient has been having excessive daytime sleepiness Patient has been having extreme fatigue and tiredness, lack of energy +  very Loud snoring every night + struggling breathe at night and gasps for air   Discussed sleep data and reviewed with patient.  Encouraged proper weight management.  Discussed driving precautions and its relationship with hypersomnolence.  Discussed operating dangerous equipment and its relationship with hypersomnolence.  Discussed sleep hygiene, and benefits of a fixed sleep waked time.  The importance of getting eight or more hours of sleep discussed with patient.  Discussed limiting the use of the computer and television before bedtime.  Decrease naps during the day, so night time sleep will become enhanced.  Limit caffeine, and sleep deprivation.  HTN, stroke, and heart failure are potential risk factors.     PAST MEDICAL HISTORY :   has a past medical history of Allergy, Chronic back pain, Hypercholesterolemia, Hypertension, and Migraines.  has a past surgical history that includes Cholecystectomy; Back surgery (08/12/14); and Knee arthroscopy. Prior to Admission medications   Medication Sig Start Date End Date Taking? Authorizing Provider  allopurinol (ZYLOPRIM) 300 MG tablet TAKE 1 TABLET BY MOUTH EVERY DAY 09/18/18  Yes Einar Pheasant, MD  aspirin EC 81 MG tablet Take 81 mg by mouth daily.   Yes [provider]  atorvastatin (LIPITOR) 40 MG tablet Take 1 tablet (40 mg total) by mouth daily. 10/10/18  Yes Einar Pheasant, MD  carisoprodol (SOMA) 350 MG tablet Take 350 mg by mouth 4 (four)  times daily as needed for muscle spasms.    Yes [provider]  metoprolol succinate (TOPROL-XL) 50 MG 24 hr tablet TAKE 2 TABLETS BY MOUTH DAILY WITH OR IMMEDIATELY FOLLOWING A MEAL. 10/16/18  Yes Einar Pheasant, MD  oxyCODONE-acetaminophen (PERCOCET) 10-325 MG tablet Take 1 tablet by mouth every 4 (four) hours as needed for pain.   Yes [provider]  pantoprazole (PROTONIX) 40 MG tablet Take 1 tablet (40 mg total) by mouth 2 (two) times daily before a meal. 09/29/18  Yes Einar Pheasant, MD  triamterene-hydrochlorothiazide (MAXZIDE-25) 37.5-25 MG tablet TAKE 1 TABLET BY MOUTH DAILY. 10/04/18  Yes Einar Pheasant, MD   No Known Allergies  FAMILY HISTORY:  family history includes Cancer in his father; Coronary artery disease in his unknown relative; Diabetes in his maternal grandfather and paternal grandfather; Heart disease in his cousin; Hypertension in his paternal grandfather; Kidney cancer in his paternal grandfather. SOCIAL HISTORY:  reports that he has never smoked. He has never used smokeless tobacco. He reports that he does not drink alcohol or use drugs.  REVIEW OF SYSTEMS:   Constitutional: Negative for fever, chills, weight loss, +malaise/fatigue  HENT: Negative for hearing loss, ear pain, nosebleeds, congestion, sore throat, neck pain, tinnitus and ear discharge.   Eyes: Negative for blurred vision, double vision, photophobia, pain, discharge and redness.  Respiratory: Negative for cough, hemoptysis, sputum production, shortness of breath, wheezing and stridor.   Cardiovascular: Negative for chest pain, palpitations, orthopnea, claudication, leg swelling and PND.  Gastrointestinal: Negative for heartburn, nausea, vomiting, abdominal pain, diarrhea, constipation, blood in stool and melena.  Genitourinary: Negative for dysuria,  urgency, frequency, hematuria and flank pain.  Musculoskeletal: Negative for myalgias, +back pain, -joint pain and -falls.  Skin:  Negative for itching and rash.  Neurological: Negative for dizziness, tingling, tremors, sensory change, speech change, focal weakness, seizures, loss of consciousness, weakness and headaches.  Endo/Heme/Allergies: Negative for environmental allergies and polydipsia. Does not bruise/bleed easily.  ALL OTHER ROS ARE NEGATIVE    BP 122/80 (BP Location: Left Arm, Cuff Size: Normal)   Pulse 76   Ht 5\' 7"  (1.702 m)   Wt 221 lb 6.4 oz (100.4 kg)   SpO2 96%   BMI 34.68 kg/m    Physical Examination:   GENERAL:NAD, no fevers, chills, no weakness no fatigue HEAD: Normocephalic, atraumatic.  EYES: Pupils equal, round, reactive to light. Extraocular muscles intact. No scleral icterus.  MOUTH: Moist mucosal membrane.   EAR, NOSE, THROAT: Clear without exudates. No external lesions.  NECK: Supple. No thyromegaly. No nodules. No JVD.  PULMONARY:CTA B/L no wheezes, no crackles, no rhonchi CARDIOVASCULAR: S1 and S2. Regular rate and rhythm. No murmurs, rubs, or gallops. No edema.  GASTROINTESTINAL: Soft, nontender, nondistended. No masses. Positive bowel sounds.  MUSCULOSKELETAL: No swelling, clubbing, or edema. Range of motion full in all extremities.  NEUROLOGIC: Cranial nerves II through XII are intact. No gross focal neurological deficits.  SKIN: No ulceration, lesions, rashes, or cyanosis. Skin warm and dry. Turgor intact.  PSYCHIATRIC: Mood, affect within normal limits. The patient is awake, alert and oriented x 3. Insight, judgment intact.      ASSESSMENT / PLAN: Severe Excessive daytime sleepiness with EPWORTH SLEEP SCORE 18  Obtain Sleep study ASAP  Obesity -recommend significant weight loss -recommend changing diet  Deconditioned state -Recommend increased daily activity and exercise     Patient satisfied with Plan of action and management. All questions answered Follow up after test completed   Corrin Parker, M.D.  [facility]    Medical Director [place] Director [facility] Cardio[facility] Department

## 2018-11-22 NOTE — Patient Instructions (Signed)
Plan for Home sleep Study  Recommend Weight loss

## 2018-11-28 DIAGNOSIS — G4733 Obstructive sleep apnea (adult) (pediatric): Secondary | ICD-10-CM | POA: Diagnosis not present

## 2018-12-01 DIAGNOSIS — G4733 Obstructive sleep apnea (adult) (pediatric): Secondary | ICD-10-CM | POA: Diagnosis not present

## 2018-12-06 ENCOUNTER — Telehealth: Payer: Self-pay | Admitting: *Deleted

## 2018-12-06 DIAGNOSIS — G4733 Obstructive sleep apnea (adult) (pediatric): Secondary | ICD-10-CM

## 2018-12-06 NOTE — Telephone Encounter (Signed)
Pt aware of results.  Orders placed. 

## 2018-12-17 ENCOUNTER — Other Ambulatory Visit: Payer: Self-pay | Admitting: Internal Medicine

## 2018-12-19 ENCOUNTER — Other Ambulatory Visit: Payer: Self-pay

## 2018-12-19 DIAGNOSIS — G4733 Obstructive sleep apnea (adult) (pediatric): Secondary | ICD-10-CM

## 2018-12-23 ENCOUNTER — Other Ambulatory Visit: Payer: Self-pay | Admitting: Internal Medicine

## 2018-12-31 ENCOUNTER — Other Ambulatory Visit: Payer: Self-pay | Admitting: Internal Medicine

## 2019-01-15 ENCOUNTER — Ambulatory Visit: Payer: Self-pay | Admitting: Internal Medicine

## 2019-01-18 ENCOUNTER — Other Ambulatory Visit: Payer: Self-pay | Admitting: Internal Medicine

## 2019-02-06 ENCOUNTER — Other Ambulatory Visit: Payer: Self-pay | Admitting: Internal Medicine

## 2019-02-06 MED ORDER — METOPROLOL SUCCINATE ER 50 MG PO TB24: ORAL_TABLET | ORAL | 4 refills | Status: DC

## 2019-02-06 NOTE — Telephone Encounter (Signed)
Patient called and asked to reschedule his appointment due to the coronavirus, rescheduled to Thursday, 06/21/19 at 0900. He says he will need refills on his medications to last until his appointment.

## 2019-02-08 ENCOUNTER — Ambulatory Visit: Payer: Self-pay | Admitting: Internal Medicine

## 2019-02-18 ENCOUNTER — Other Ambulatory Visit: Payer: Self-pay | Admitting: Internal Medicine

## 2019-03-20 ENCOUNTER — Other Ambulatory Visit: Payer: Self-pay | Admitting: Internal Medicine

## 2019-04-11 ENCOUNTER — Other Ambulatory Visit: Payer: Self-pay | Admitting: Internal Medicine

## 2019-06-13 ENCOUNTER — Other Ambulatory Visit: Payer: Self-pay | Admitting: Internal Medicine

## 2019-06-17 ENCOUNTER — Other Ambulatory Visit: Payer: Self-pay | Admitting: Internal Medicine

## 2019-06-19 ENCOUNTER — Other Ambulatory Visit: Payer: Self-pay

## 2019-06-21 ENCOUNTER — Other Ambulatory Visit: Payer: Self-pay

## 2019-06-21 ENCOUNTER — Ambulatory Visit (INDEPENDENT_AMBULATORY_CARE_PROVIDER_SITE_OTHER): Payer: Medicare Other | Admitting: Internal Medicine

## 2019-06-21 DIAGNOSIS — E1149 Type 2 diabetes mellitus with other diabetic neurological complication: Secondary | ICD-10-CM

## 2019-06-21 DIAGNOSIS — E669 Obesity, unspecified: Secondary | ICD-10-CM

## 2019-06-21 DIAGNOSIS — E7800 Pure hypercholesterolemia, unspecified: Secondary | ICD-10-CM

## 2019-06-21 DIAGNOSIS — F439 Reaction to severe stress, unspecified: Secondary | ICD-10-CM

## 2019-06-21 DIAGNOSIS — M545 Low back pain, unspecified: Secondary | ICD-10-CM

## 2019-06-21 DIAGNOSIS — I10 Essential (primary) hypertension: Secondary | ICD-10-CM | POA: Diagnosis not present

## 2019-06-21 DIAGNOSIS — K219 Gastro-esophageal reflux disease without esophagitis: Secondary | ICD-10-CM

## 2019-06-21 DIAGNOSIS — G8929 Other chronic pain: Secondary | ICD-10-CM | POA: Diagnosis not present

## 2019-06-21 LAB — CBC WITH DIFFERENTIAL/PLATELET
Basophils Absolute: 0.1 10*3/uL (ref 0.0–0.1)
Basophils Relative: 0.5 % (ref 0.0–3.0)
Eosinophils Absolute: 0.2 10*3/uL (ref 0.0–0.7)
Eosinophils Relative: 1.8 % (ref 0.0–5.0)
HCT: 44.1 % (ref 39.0–52.0)
Hemoglobin: 14.3 g/dL (ref 13.0–17.0)
Lymphocytes Relative: 36.8 % (ref 12.0–46.0)
Lymphs Abs: 3.4 10*3/uL (ref 0.7–4.0)
MCHC: 32.5 g/dL (ref 30.0–36.0)
MCV: 90 fl (ref 78.0–100.0)
Monocytes Absolute: 0.8 10*3/uL (ref 0.1–1.0)
Monocytes Relative: 8.2 % (ref 3.0–12.0)
Neutro Abs: 4.9 10*3/uL (ref 1.4–7.7)
Neutrophils Relative %: 52.7 % (ref 43.0–77.0)
Platelets: 170 10*3/uL (ref 150.0–400.0)
RBC: 4.9 Mil/uL (ref 4.22–5.81)
RDW: 13.9 % (ref 11.5–15.5)
WBC: 9.4 10*3/uL (ref 4.0–10.5)

## 2019-06-21 LAB — MICROALBUMIN / CREATININE URINE RATIO
Creatinine,U: 56.3 mg/dL
Microalb Creat Ratio: 1.2 mg/g (ref 0.0–30.0)
Microalb, Ur: <0.7 mg/dL (ref 0.0–1.9)

## 2019-06-21 LAB — LIPID PANEL
Cholesterol: 222 mg/dL — ABNORMAL HIGH (ref 0–200)
HDL: 41.1 mg/dL (ref >39.00)
NonHDL: 181.25
Total CHOL/HDL Ratio: 5
Triglycerides: 308 mg/dL — ABNORMAL HIGH (ref 0.0–149.0)
VLDL: 61.6 mg/dL — ABNORMAL HIGH (ref 0.0–40.0)

## 2019-06-21 LAB — BASIC METABOLIC PANEL
BUN: 12 mg/dL (ref 6–23)
CO2: 26 mEq/L (ref 19–32)
Calcium: 9.9 mg/dL (ref 8.4–10.5)
Chloride: 99 mEq/L (ref 96–112)
Creatinine, Ser: 0.85 mg/dL (ref 0.40–1.50)
GFR: 95.36 mL/min (ref >60.00)
Glucose, Bld: 118 mg/dL — ABNORMAL HIGH (ref 70–99)
Potassium: 4 mEq/L (ref 3.5–5.1)
Sodium: 134 mEq/L — ABNORMAL LOW (ref 135–145)

## 2019-06-21 LAB — HEMOGLOBIN A1C: Hgb A1c MFr Bld: 6.5 % (ref 4.6–6.5)

## 2019-06-21 LAB — HEPATIC FUNCTION PANEL
ALT: 35 U/L (ref 0–53)
AST: 37 U/L (ref 0–37)
Albumin: 4.6 g/dL (ref 3.5–5.2)
Alkaline Phosphatase: 77 U/L (ref 39–117)
Bilirubin, Direct: 0.1 mg/dL (ref 0.0–0.3)
Total Bilirubin: 0.6 mg/dL (ref 0.2–1.2)
Total Protein: 7.3 g/dL (ref 6.0–8.3)

## 2019-06-21 LAB — LDL CHOLESTEROL, DIRECT: Direct LDL: 142 mg/dL

## 2019-06-21 NOTE — Progress Notes (Signed)
[name], male   DOB: 04-17-1971, 48 y.o.   MRN: [id number]   Subjective:    [name], male    DOB: 04-01-1971, 48 y.o.   MRN: [id number]  HPI  Patient here for a scheduled follow up.  Fall two weeks ago.  Some increased pain/spasm in right lower posterior back.  He has chronic back pain.  Sees Dr Mauri Pole.  He prescribes his oxycodone.  Has f/u with Dr Mauri Pole next week.  States has been taking an extra 1/2 tablet per day.  Requested to have a few oxycodone to get him through until his appt with Dr Mauri Pole.  Is not out.  Discussed that he needed to contact Dr Mauri Pole if needs extra medication.  States he would call.  No chest pain.  No increased heart rate or palpitations.  Taking protonix bid now.  Is better.  Had discussed referral to GI given need for bid PPI.  He is agreeable.  Out of lipitor.  No abdominal pain.  Some constipation with narcotic medication.  Discussed miralax, etc.  Not watching his diet.  Not exercising regularly.     Past Medical History:  Diagnosis Date   Allergy    Chronic back pain    Hypercholesterolemia    Hypertension    Migraines    Past Surgical History:  Procedure Laterality Date   BACK SURGERY  08/12/14   multiple   CHOLECYSTECTOMY     KNEE ARTHROSCOPY     Dr HBK   Family History  Problem Relation Age of Onset   Cancer Father        germ cell (retroperitoneal)   Coronary artery disease Other        s/p stent (age 96)   Hypertension Paternal Grandfather    Kidney cancer Paternal Grandfather    Diabetes Paternal Grandfather    Diabetes Maternal Grandfather    Heart disease Cousin        MI - age 56   Social History   Socioeconomic History   Marital status: Significant Other    Spouse name: Not on file   Number of children: 2   Years of education: 12   Highest education level: Not on file  Occupational History   Occupation: disabled    Employer: [employer]  Social Needs    Emergency planning/management officer strain: Not on file   Food insecurity    Worry: Not on file    Inability: Not on file   Transportation needs    Medical: Not on file    Non-medical: Not on file  Tobacco Use   Smoking status: Never Smoker   Smokeless tobacco: Never Used  Substance and Sexual Activity   Alcohol use: No    Alcohol/week: 0.0 standard drinks   Drug use: No   Sexual activity: Not on file  Lifestyle   Physical activity    Days per week: Not on file    Minutes per session: Not on file   Stress: Not on file  Relationships   Social connections    Talks on phone: Not on file    Gets together: Not on file    Attends religious service: Not on file    Active member of club or organization: Not on file    Attends meetings of clubs or organizations: Not on file    Relationship status: Not on file  Other Topics Concern   Not on file  Social History Narrative  Lives with girlfriend in a one story home.  Has 2 sons.  Trying to get disability due to several back issues.  Education: high school.     Outpatient Encounter Medications as of 06/21/2019  Medication Sig   allopurinol (ZYLOPRIM) 300 MG tablet TAKE 1 TABLET BY MOUTH EVERY DAY   aspirin EC 81 MG tablet Take 81 mg by mouth daily.   atorvastatin (LIPITOR) 40 MG tablet TAKE 1 TABLET BY MOUTH EVERY DAY   carisoprodol (SOMA) 350 MG tablet Take 350 mg by mouth 4 (four) times daily as needed for muscle spasms.    metoprolol succinate (TOPROL-XL) 50 MG 24 hr tablet TAKE 2 TABLETS BY MOUTH DAILY WITH OR IMMEDIATELY FOLLOWING A MEAL.   Oxycodone HCl 10 MG TABS 1 TAB(S) ORALLY EVERY 6 HOURS X 30 DAYS, AS NEEDED FILL ON OR AFTE 05/27/19   pantoprazole (PROTONIX) 40 MG tablet TAKE 1 TABLET (40 MG TOTAL) BY MOUTH 2 (TWO) TIMES DAILY BEFORE A MEAL.   triamterene-hydrochlorothiazide (MAXZIDE-25) 37.5-25 MG tablet TAKE 1 TABLET BY MOUTH EVERY DAY   [DISCONTINUED] oxyCODONE-acetaminophen (PERCOCET) 10-325 MG tablet Take 1 tablet by  mouth every 4 (four) hours as needed for pain.   No facility-administered encounter medications on file as of 06/21/2019.     Review of Systems  Constitutional: Negative for appetite change and unexpected weight change.  HENT: Negative for congestion and sinus pressure.   Respiratory: Negative for cough, chest tightness and shortness of breath.   Cardiovascular: Negative for chest pain, palpitations and leg swelling.  Gastrointestinal: Negative for abdominal pain, diarrhea, nausea and vomiting.  Genitourinary: Negative for difficulty urinating and dysuria.  Musculoskeletal: Positive for back pain. Negative for joint swelling and myalgias.  Skin: Negative for color change and rash.  Neurological: Negative for dizziness, light-headedness and headaches.  Psychiatric/Behavioral: Negative for agitation and dysphoric mood.       Objective:    Physical Exam Constitutional:      General: He is not in acute distress.    Appearance: Normal appearance. He is well-developed.  HENT:     Right Ear: External ear normal.     Left Ear: External ear normal.  Eyes:     General: No scleral icterus.       Right eye: No discharge.        Left eye: No discharge.     Conjunctiva/sclera: Conjunctivae normal.  Neck:     Musculoskeletal: Neck supple. No muscular tenderness.  Cardiovascular:     Rate and Rhythm: Normal rate and regular rhythm.  Pulmonary:     Effort: Pulmonary effort is normal. No respiratory distress.     Breath sounds: Normal breath sounds.  Abdominal:     General: Bowel sounds are normal.     Palpations: Abdomen is soft.     Tenderness: There is no abdominal tenderness.  Musculoskeletal:        General: No swelling or tenderness.     Comments: Increased back pain - right lower posterior back - some increased pain with sitting on exam table (describes as spasm).    Lymphadenopathy:     Cervical: No cervical adenopathy.  Skin:    Findings: No erythema or rash.  Neurological:      Mental Status: He is alert.  Psychiatric:        Mood and Affect: Mood normal.        Behavior: Behavior normal.     BP 140/78    Pulse 75    Temp  98.2 F (36.8 C) (Oral)    Resp 16    Ht 5' 7"  (1.702 m)    Wt 222 lb (100.7 kg)    SpO2 97%    BMI 34.77 kg/m  Wt Readings from Last 3 Encounters:  06/21/19 222 lb (100.7 kg)  11/22/18 221 lb 6.4 oz (100.4 kg)  09/29/18 220 lb 6.4 oz (100 kg)     Lab Results  Component Value Date   WBC 9.4 06/21/2019   HGB 14.3 06/21/2019   HCT 44.1 06/21/2019   PLT 170.0 06/21/2019   GLUCOSE 118 (H) 06/21/2019   CHOL 222 (H) 06/21/2019   TRIG 308.0 (H) 06/21/2019   HDL 41.10 06/21/2019   LDLDIRECT 142.0 06/21/2019   LDLCALC 123 (H) 02/18/2017   ALT 35 06/21/2019   AST 37 06/21/2019   NA 134 (L) 06/21/2019   K 4.0 06/21/2019   CL 99 06/21/2019   CREATININE 0.85 06/21/2019   BUN 12 06/21/2019   CO2 26 06/21/2019   TSH 4.43 06/29/2018   PSA 0.43 01/04/2018   HGBA1C 6.5 06/21/2019   MICROALBUR <0.7 06/21/2019    Mr Knee Left Wo Contrast  Result Date: 08/17/2018 CLINICAL DATA:  Left knee instability. Mass along the anterior aspect. EXAM: MRI OF THE LEFT KNEE WITHOUT CONTRAST TECHNIQUE: Multiplanar, multisequence MR imaging of the knee was performed. No intravenous contrast was administered. COMPARISON:  None. FINDINGS: MENISCI Medial meniscus:  Intact. Lateral meniscus:  Intact. LIGAMENTS Cruciates:  Intact ACL and PCL. Collaterals: Medial collateral ligament is intact. Lateral collateral ligament complex is intact. 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 Medial:  No chondral defect. Lateral:  No chondral defect. Joint: Small joint effusion. Edema in Hoffa's fat. Prominent medial patellar plica extending into the medial patellofemoral joint space. Popliteal Fossa:  No Baker cyst. Intact popliteus tendon. Extensor Mechanism: Intact  quadriceps tendon. Intact patellar tendon. Intact medial patellar retinaculum. Intact lateral patellar retinaculum. Intact MPFL. Bones:  No acute osseous abnormality.  No aggressive osseous lesion. Other: No fluid collection or hematoma. IMPRESSION: 1. 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 2. No ligamentous or meniscal injury of left knee. Electronically Signed   By: Kathreen Devoid   On: 08/17/2018 08:39       Assessment & Plan:   Problem List Items Addressed This Visit    Chronic back pain    Followed by ortho - Dr Mauri Pole.  Recent fall as outlined.  Some increased back pain/spasm since.  Taking an extra 1/2 oxycodone recently.  Still has medication.  Has f/u with Dr Mauri Pole next week.  He will call and notify Dr Mauri Pole and make him aware of his request for more oxycodone.        Relevant Medications   Oxycodone HCl 10 MG TABS   GERD (gastroesophageal reflux disease)    On protonix bid now.  Symptoms have improved.  Refer to GI.  Given persistent need for bid PPI.        Relevant Orders   Ambulatory referral to Gastroenterology   Hypercholesterolemia    Off lipitor.  Low cholesterol diet and exercise.  Follow lipid panel and liver function tests.        Relevant Orders   Hepatic function panel (Completed)   Lipid panel (Completed)   Hypertension   Relevant Orders   CBC with Differential/Platelet (Completed)   Basic  metabolic panel (Completed)   Obesity (BMI 30-39.9)    Discussed diet and exercise.  Follow.       Stress    Discussed with him today.  Overall handling things relatively well.  Follow.        Type 2 diabetes mellitus with neurological complications (HCC)    Low carb diet and exercise.  Follow met b and a1c.        Relevant Orders   Hemoglobin A1c (Completed)   Microalbumin / creatinine urine ratio (Completed)       Einar Pheasant, MD

## 2019-06-22 ENCOUNTER — Other Ambulatory Visit: Payer: Self-pay | Admitting: Internal Medicine

## 2019-06-22 DIAGNOSIS — E871 Hypo-osmolality and hyponatremia: Secondary | ICD-10-CM

## 2019-06-22 NOTE — Progress Notes (Signed)
Order placed for f/u sodium.  ?

## 2019-06-24 ENCOUNTER — Encounter: Payer: Self-pay | Admitting: Internal Medicine

## 2019-06-24 NOTE — Assessment & Plan Note (Signed)
Followed by ortho - Dr Mauri Pole.  Recent fall as outlined.  Some increased back pain/spasm since.  Taking an extra 1/2 oxycodone recently.  Still has medication.  Has f/u with Dr Mauri Pole next week.  He will call and notify Dr Mauri Pole and make him aware of his request for more oxycodone.

## 2019-06-24 NOTE — Assessment & Plan Note (Signed)
Discussed diet and exercise.  Follow.  

## 2019-06-24 NOTE — Assessment & Plan Note (Addendum)
On protonix bid now.  Symptoms have improved.  Refer to GI.  Given persistent need for bid PPI.

## 2019-06-24 NOTE — Assessment & Plan Note (Signed)
Low carb diet and exercise.  Follow met b and a1c.   

## 2019-06-24 NOTE — Assessment & Plan Note (Signed)
Discussed with him today.  Overall handling things relatively well.  Follow.

## 2019-06-24 NOTE — Assessment & Plan Note (Signed)
Off lipitor.  Low cholesterol diet and exercise.  Follow lipid panel and liver function tests.

## 2019-06-28 DIAGNOSIS — M545 Low back pain: Secondary | ICD-10-CM | POA: Diagnosis not present

## 2019-06-28 DIAGNOSIS — S300XXA Contusion of lower back and pelvis, initial encounter: Secondary | ICD-10-CM | POA: Diagnosis not present

## 2019-07-18 ENCOUNTER — Other Ambulatory Visit: Payer: Medicare Other

## 2019-07-20 ENCOUNTER — Other Ambulatory Visit: Payer: Self-pay | Admitting: Unknown Physician Specialty

## 2019-07-20 DIAGNOSIS — M4727 Other spondylosis with radiculopathy, lumbosacral region: Secondary | ICD-10-CM

## 2019-07-31 ENCOUNTER — Other Ambulatory Visit: Payer: Self-pay

## 2019-07-31 ENCOUNTER — Ambulatory Visit
Admission: RE | Admit: 2019-07-31 | Discharge: 2019-07-31 | Disposition: A | Discharge status: Home / Self Care | Payer: Medicare Other | Source: Ambulatory Visit | Attending: Unknown Physician Specialty | Admitting: Unknown Physician Specialty

## 2019-07-31 DIAGNOSIS — M4727 Other spondylosis with radiculopathy, lumbosacral region: Secondary | ICD-10-CM | POA: Insufficient documentation

## 2019-07-31 DIAGNOSIS — M545 Low back pain: Secondary | ICD-10-CM | POA: Diagnosis not present

## 2019-08-26 ENCOUNTER — Other Ambulatory Visit: Payer: Self-pay | Admitting: Internal Medicine

## 2019-08-31 ENCOUNTER — Other Ambulatory Visit: Payer: Medicare Other

## 2019-09-10 ENCOUNTER — Other Ambulatory Visit: Payer: Self-pay | Admitting: Internal Medicine

## 2019-09-12 ENCOUNTER — Other Ambulatory Visit: Payer: Self-pay | Admitting: Internal Medicine

## 2019-09-14 ENCOUNTER — Other Ambulatory Visit: Payer: Self-pay | Admitting: Internal Medicine

## 2019-10-30 ENCOUNTER — Other Ambulatory Visit: Payer: Self-pay

## 2019-10-30 ENCOUNTER — Encounter: Payer: Self-pay | Admitting: Internal Medicine

## 2019-10-30 ENCOUNTER — Ambulatory Visit (INDEPENDENT_AMBULATORY_CARE_PROVIDER_SITE_OTHER): Payer: Medicare Other | Admitting: Internal Medicine

## 2019-10-30 VITALS — Ht 68.0 in | Wt 222.0 lb

## 2019-10-30 DIAGNOSIS — R531 Weakness: Secondary | ICD-10-CM

## 2019-10-30 DIAGNOSIS — G8929 Other chronic pain: Secondary | ICD-10-CM | POA: Diagnosis not present

## 2019-10-30 DIAGNOSIS — E7800 Pure hypercholesterolemia, unspecified: Secondary | ICD-10-CM | POA: Diagnosis not present

## 2019-10-30 DIAGNOSIS — M545 Low back pain, unspecified: Secondary | ICD-10-CM

## 2019-10-30 DIAGNOSIS — K219 Gastro-esophageal reflux disease without esophagitis: Secondary | ICD-10-CM | POA: Diagnosis not present

## 2019-10-30 DIAGNOSIS — I10 Essential (primary) hypertension: Secondary | ICD-10-CM

## 2019-10-30 DIAGNOSIS — E1149 Type 2 diabetes mellitus with other diabetic neurological complication: Secondary | ICD-10-CM

## 2019-10-30 DIAGNOSIS — Z1211 Encounter for screening for malignant neoplasm of colon: Secondary | ICD-10-CM | POA: Diagnosis not present

## 2019-10-30 DIAGNOSIS — F439 Reaction to severe stress, unspecified: Secondary | ICD-10-CM

## 2019-10-30 NOTE — Progress Notes (Signed)
[name], male   DOB: Feb 08, 1971, 48 y.o.   MRN: [id number]   Virtual Visit via video Note  This visit type was conducted due to national recommendations for restrictions regarding the COVID-19 pandemic (e.g. social distancing).  This format is felt to be most appropriate for this patient at this time.  All issues noted in this document were discussed and addressed.  No physical exam was performed (except for noted visual exam findings with Video Visits).   I connected with Cameron Sanchez by a video enabled telemedicine application and verified that I am speaking with the correct person using two identifiers. Location patient: home Location provider: work  Persons participating in the virtual visit: patient, provider  I discussed the limitations, risks, security and privacy concerns of performing an evaluation and management service by video and the availability of in person appointments.  The patient expressed understanding and agreed to proceed.   Reason for visit: scheduled follow up.   HPI: He has been staying home.  Not going out much.  States eating and gaining weight.  Feels he may have gained 20 pounds recently.  Has noticed over the last two weeks, weak spells.  Has occurred on 3 separate occasions.  Once just walking out to his garage.  Had not eaten for several hours.  Another episode when he was driving.  Pulled into his driveway.  Resolves after 5 minutes.  Not necessarily related to not eating.  No headache.  No dizziness or light headedness.  No chest pain or increased heart rate or palpitations.  No sob.  No known trigger or associated symptoms.  Still has issues with constipation.  Takes 4 oxycodone per day.  Takes laxatives to help keep his bowels moving.  Still with chronic back pain.  Sees Dr Mauri Pole.  Has f/u1/02/2020.  Having some problems with his left knee and right hip.  Plans to discuss with Dr Mauri Pole.  Taking his blood pressure medication.  Has not checked his  pressure.  Does not feel is elevated.  Taking his father's lipitor - 1/2 ('80mg'$ ) tablet.  Unable to afford.  Reports increased acid reflux.  Worsened recently.  Feels acid coming up in his mouth.  Worse if eats late.  Taking protonix daily.     ROS: See pertinent positives and negatives per HPI.  Past Medical History:  Diagnosis Date  . Allergy   . Chronic back pain   . Hypercholesterolemia   . Hypertension   . Migraines     Past Surgical History:  Procedure Laterality Date  . BACK SURGERY  08/12/14   multiple  . CHOLECYSTECTOMY    . KNEE ARTHROSCOPY     Dr HBK    Family History  Problem Relation Age of Onset  . Cancer Father        germ cell (retroperitoneal)  . Coronary artery disease Other        s/p stent (age 73)  . Hypertension Paternal Grandfather   . Kidney cancer Paternal Grandfather   . Diabetes Paternal Grandfather   . Diabetes Maternal Grandfather   . Heart disease Cousin        MI - age 74    SOCIAL HX: reviewed.    Current Outpatient Medications:  .  allopurinol (ZYLOPRIM) 300 MG tablet, TAKE 1 TABLET BY MOUTH EVERY DAY, Disp: 30 tablet, Rfl: 2 .  aspirin EC 81 MG tablet, Take 81 mg by mouth daily., Disp: , Rfl:  .  atorvastatin (LIPITOR)  40 MG tablet, TAKE 1 TABLET BY MOUTH EVERY DAY, Disp: 30 tablet, Rfl: 2 .  carisoprodol (SOMA) 350 MG tablet, Take 350 mg by mouth 4 (four) times daily as needed for muscle spasms. , Disp: , Rfl:  .  metoprolol succinate (TOPROL-XL) 50 MG 24 hr tablet, TAKE 2 TABLETS BY MOUTH DAILY WITH OR IMMEDIATELY FOLLOWING A MEAL., Disp: 60 tablet, Rfl: 3 .  Oxycodone HCl 10 MG TABS, 1 TAB(S) ORALLY EVERY 6 HOURS X 30 DAYS, AS NEEDED FILL ON OR AFTE 05/27/19, Disp: , Rfl:  .  pantoprazole (PROTONIX) 40 MG tablet, TAKE 1 TABLET (40 MG TOTAL) BY MOUTH 2 (TWO) TIMES DAILY BEFORE A MEAL., Disp: 60 tablet, Rfl: 2 .  triamterene-hydrochlorothiazide (MAXZIDE-25) 37.5-25 MG tablet, TAKE 1 TABLET BY MOUTH EVERY DAY, Disp: 90 tablet, Rfl: 1   EXAM:  GENERAL: alert, oriented, appears well and in no acute distress  HEENT: atraumatic, conjunttiva clear, no obvious abnormalities on inspection of external nose and ears  NECK: normal movements of the head and neck  LUNGS: on inspection no signs of respiratory distress, breathing rate appears normal, no obvious gross SOB, gasping or wheezing  CV: no obvious cyanosis  PSYCH/NEURO: pleasant and cooperative, no obvious depression or anxiety, speech and thought processing grossly intact  ASSESSMENT AND PLAN:  Discussed the following assessment and plan:  Chronic back pain Followed by ortho - Dr Mauri Pole.  On oxycodone.  Has f/u soon.    GERD (gastroesophageal reflux disease) On protonix bid now.  Still with persistent acid reflux.  Discussed the need for GI evaluation.  Need to schedule.    Hypercholesterolemia On lipitor as outlined.  Low cholesterol diet and exercise.  Follow lipid panel and liver function tests.    Hypertension Not checking pressures.  Has not felt was elevated.  Continue current medication regimen.  Follow pressures.  Follow metabolic panel.    Stress Handling things relatively well.  Follow.    Type 2 diabetes mellitus with neurological complications (HCC) Not watching his diet.  Not exercising.  Low carb diet and exercise.  Follow met b and a1c.   Spell of generalized weakness Has had a few weak spells as outlined.  Discussed possible triggers.  Discussed making sure eating regular meals. Discussed checking sugar.  Discussed further w/up.  Discussed needing lab check.  Declines further w/up.  Wants to start with lab check.  Follow.  Discussed not driving until can sort through etiology.      I discussed the assessment and treatment plan with the patient. The patient was provided an opportunity to ask questions and all were answered. The patient agreed with the plan and demonstrated an understanding of the instructions.   The patient was advised to call  back or seek an in-person evaluation if the symptoms worsen or if the condition fails to improve as anticipated.   Einar Pheasant, MD

## 2019-11-03 ENCOUNTER — Encounter: Payer: Self-pay | Admitting: Internal Medicine

## 2019-11-03 DIAGNOSIS — R531 Weakness: Secondary | ICD-10-CM | POA: Insufficient documentation

## 2019-11-03 NOTE — Assessment & Plan Note (Signed)
On lipitor as outlined.  Low cholesterol diet and exercise.  Follow lipid panel and liver function tests.

## 2019-11-03 NOTE — Assessment & Plan Note (Signed)
Handling things relatively well.  Follow.  

## 2019-11-03 NOTE — Assessment & Plan Note (Signed)
Not watching his diet.  Not exercising.  Low carb diet and exercise.  Follow met b and a1c.

## 2019-11-03 NOTE — Assessment & Plan Note (Signed)
Not checking pressures.  Has not felt was elevated.  Continue current medication regimen.  Follow pressures.  Follow metabolic panel.

## 2019-11-03 NOTE — Assessment & Plan Note (Signed)
Has had a few weak spells as outlined.  Discussed possible triggers.  Discussed making sure eating regular meals. Discussed checking sugar.  Discussed further w/up.  Discussed needing lab check.  Declines further w/up.  Wants to start with lab check.  Follow.  Discussed not driving until can sort through etiology.

## 2019-11-03 NOTE — Assessment & Plan Note (Signed)
On protonix bid now.  Still with persistent acid reflux.  Discussed the need for GI evaluation.  Need to schedule.

## 2019-11-03 NOTE — Assessment & Plan Note (Signed)
Followed by ortho - Dr Mauri Pole.  On oxycodone.  Has f/u soon.

## 2019-11-14 ENCOUNTER — Other Ambulatory Visit: Payer: Self-pay

## 2019-11-19 ENCOUNTER — Other Ambulatory Visit: Payer: Self-pay | Admitting: Internal Medicine

## 2019-11-20 ENCOUNTER — Other Ambulatory Visit: Payer: Self-pay

## 2019-11-20 ENCOUNTER — Other Ambulatory Visit (INDEPENDENT_AMBULATORY_CARE_PROVIDER_SITE_OTHER): Payer: Medicare Other

## 2019-11-20 DIAGNOSIS — E1149 Type 2 diabetes mellitus with other diabetic neurological complication: Secondary | ICD-10-CM | POA: Diagnosis not present

## 2019-11-20 DIAGNOSIS — E7800 Pure hypercholesterolemia, unspecified: Secondary | ICD-10-CM | POA: Diagnosis not present

## 2019-11-20 DIAGNOSIS — E871 Hypo-osmolality and hyponatremia: Secondary | ICD-10-CM

## 2019-11-20 DIAGNOSIS — I10 Essential (primary) hypertension: Secondary | ICD-10-CM

## 2019-11-20 LAB — LIPID PANEL
Cholesterol: 153 mg/dL (ref 0–200)
HDL: 40 mg/dL (ref >39.00)
LDL Cholesterol: 75 mg/dL (ref 0–99)
NonHDL: 112.54
Total CHOL/HDL Ratio: 4
Triglycerides: 187 mg/dL — ABNORMAL HIGH (ref 0.0–149.0)
VLDL: 37.4 mg/dL (ref 0.0–40.0)

## 2019-11-20 LAB — BASIC METABOLIC PANEL
BUN: 11 mg/dL (ref 6–23)
CO2: 24 mEq/L (ref 19–32)
Calcium: 9.6 mg/dL (ref 8.4–10.5)
Chloride: 98 mEq/L (ref 96–112)
Creatinine, Ser: 0.88 mg/dL (ref 0.40–1.50)
GFR: 91.47 mL/min (ref >60.00)
Glucose, Bld: 119 mg/dL — ABNORMAL HIGH (ref 70–99)
Potassium: 4.2 mEq/L (ref 3.5–5.1)
Sodium: 133 mEq/L — ABNORMAL LOW (ref 135–145)

## 2019-11-20 LAB — TSH: TSH: 3.53 u[IU]/mL (ref 0.35–4.50)

## 2019-11-20 LAB — SODIUM: Sodium: 133 mEq/L — ABNORMAL LOW (ref 135–145)

## 2019-11-20 LAB — HEPATIC FUNCTION PANEL
ALT: 41 U/L (ref 0–53)
AST: 41 U/L — ABNORMAL HIGH (ref 0–37)
Albumin: 4.4 g/dL (ref 3.5–5.2)
Alkaline Phosphatase: 90 U/L (ref 39–117)
Bilirubin, Direct: 0.2 mg/dL (ref 0.0–0.3)
Total Bilirubin: 0.9 mg/dL (ref 0.2–1.2)
Total Protein: 6.9 g/dL (ref 6.0–8.3)

## 2019-11-20 LAB — HEMOGLOBIN A1C: Hgb A1c MFr Bld: 7 % — ABNORMAL HIGH (ref 4.6–6.5)

## 2019-11-24 ENCOUNTER — Other Ambulatory Visit: Payer: Self-pay | Admitting: Internal Medicine

## 2019-11-24 MED ORDER — METFORMIN HCL 500 MG PO TABS: 500.0000 mg | ORAL_TABLET | Freq: Every day | ORAL | 2 refills | Status: DC

## 2019-11-24 NOTE — Progress Notes (Signed)
rx sent in for metformin

## 2019-12-02 ENCOUNTER — Other Ambulatory Visit: Payer: Self-pay | Admitting: Internal Medicine

## 2019-12-07 ENCOUNTER — Other Ambulatory Visit: Payer: Self-pay | Admitting: Internal Medicine

## 2019-12-20 ENCOUNTER — Telehealth: Payer: Self-pay | Admitting: Internal Medicine

## 2019-12-20 ENCOUNTER — Other Ambulatory Visit: Payer: Self-pay

## 2019-12-20 MED ORDER — ALLOPURINOL 300 MG PO TABS: 300.0000 mg | ORAL_TABLET | Freq: Every day | ORAL | 0 refills | Status: DC

## 2019-12-20 NOTE — Telephone Encounter (Signed)
Rx sent in

## 2019-12-20 NOTE — Telephone Encounter (Signed)
Patient needs a refill on allopurinol (ZYLOPRIM) 300 MG tablet.

## 2020-01-03 ENCOUNTER — Ambulatory Visit (INDEPENDENT_AMBULATORY_CARE_PROVIDER_SITE_OTHER): Payer: Medicare Other | Admitting: Internal Medicine

## 2020-01-03 ENCOUNTER — Other Ambulatory Visit: Payer: Self-pay

## 2020-01-03 VITALS — Ht 68.0 in | Wt 222.0 lb

## 2020-01-03 DIAGNOSIS — Z1211 Encounter for screening for malignant neoplasm of colon: Secondary | ICD-10-CM | POA: Diagnosis not present

## 2020-01-03 DIAGNOSIS — I10 Essential (primary) hypertension: Secondary | ICD-10-CM

## 2020-01-03 DIAGNOSIS — K219 Gastro-esophageal reflux disease without esophagitis: Secondary | ICD-10-CM

## 2020-01-03 DIAGNOSIS — G8929 Other chronic pain: Secondary | ICD-10-CM

## 2020-01-03 DIAGNOSIS — K5900 Constipation, unspecified: Secondary | ICD-10-CM

## 2020-01-03 DIAGNOSIS — E7800 Pure hypercholesterolemia, unspecified: Secondary | ICD-10-CM | POA: Diagnosis not present

## 2020-01-03 DIAGNOSIS — E1149 Type 2 diabetes mellitus with other diabetic neurological complication: Secondary | ICD-10-CM | POA: Diagnosis not present

## 2020-01-03 DIAGNOSIS — M545 Low back pain, unspecified: Secondary | ICD-10-CM

## 2020-01-03 NOTE — Progress Notes (Signed)
[name], male   DOB: 1971-03-04, 49 y.o.   MRN: [id number]   Virtual Visit via video Note  This visit type was conducted due to national recommendations for restrictions regarding the COVID-19 pandemic (e.g. social distancing).  This format is felt to be most appropriate for this patient at this time.  All issues noted in this document were discussed and addressed.  No physical exam was performed (except for noted visual exam findings with Video Visits).   I connected with Cameron Sanchez by a video enabled telemedicine application and verified that I am speaking with the correct person using two identifiers. Location patient: home Location provider: work Persons participating in the virtual visit: patient, provider  The limitations, risks, security and privacy concerns of performing an evaluation and management service by video and the availability of in person appointments have been discussed.  The patient expressed understanding and agreed to proceed.   Reason for visit: scheduled follow up.    HPI: Reports persistent back pain and muscle spasm.  Sees Dr Mauri Pole.  Has f/u planned in 02/2020.  Also having problems with left knee and right hip.  Plans to discuss with Dr Mauri Pole.  No chest pain or sob reported.  Increased acid reflux.  Have discussed  Taking protonix bid.  No abdominal pain.  Still issues with constipation.  On narcotics.  Have discussed miralax, etc.  When lies down - nose stopped up.  Discussed nasal sprays - saline nasal spray and nasacort and nasacort nasal spray.  No chest congestion or cough.  Recent labs.  a1c elevated.  Discussed metformin.     ROS: See pertinent positives and negatives per HPI.  Past Medical History:  Diagnosis Date  . Allergy   . Chronic back pain   . Hypercholesterolemia   . Hypertension   . Migraines     Past Surgical History:  Procedure Laterality Date  . BACK SURGERY  08/12/14   multiple  . CHOLECYSTECTOMY    . KNEE  ARTHROSCOPY     Dr HBK    Family History  Problem Relation Age of Onset  . Cancer Father        germ cell (retroperitoneal)  . Coronary artery disease Other        s/p stent (age 5)  . Hypertension Paternal Grandfather   . Kidney cancer Paternal Grandfather   . Diabetes Paternal Grandfather   . Diabetes Maternal Grandfather   . Heart disease Cousin        MI - age 55    SOCIAL HX: reviewed.    Current Outpatient Medications:  .  allopurinol (ZYLOPRIM) 300 MG tablet, Take 1 tablet (300 mg total) by mouth daily., Disp: 90 tablet, Rfl: 0 .  atorvastatin (LIPITOR) 40 MG tablet, TAKE 1 TABLET BY MOUTH EVERY DAY, Disp: 30 tablet, Rfl: 2 .  carisoprodol (SOMA) 350 MG tablet, Take 350 mg by mouth 4 (four) times daily as needed for muscle spasms. , Disp: , Rfl:  .  metoprolol succinate (TOPROL-XL) 50 MG 24 hr tablet, TAKE 2 TABLETS BY MOUTH DAILY WITH OR IMMEDIATELY FOLLOWING A MEAL., Disp: 180 tablet, Rfl: 1 .  Oxycodone HCl 10 MG TABS, 1 TAB(S) ORALLY EVERY 6 HOURS X 30 DAYS, AS NEEDED FILL ON OR AFTE 05/27/19, Disp: , Rfl:  .  pantoprazole (PROTONIX) 40 MG tablet, TAKE 1 TABLET (40 MG TOTAL) BY MOUTH 2 (TWO) TIMES DAILY BEFORE A MEAL., Disp: 60 tablet, Rfl: 2 .  triamterene-hydrochlorothiazide (MAXZIDE-25)  37.5-25 MG tablet, TAKE 1 TABLET BY MOUTH EVERY DAY, Disp: 90 tablet, Rfl: 1  EXAM:  GENERAL: alert, oriented, appears well and in no acute distress  HEENT: atraumatic, conjunttiva clear, no obvious abnormalities on inspection of external nose and ears  NECK: normal movements of the head and neck  LUNGS: on inspection no signs of respiratory distress, breathing rate appears normal, no obvious gross SOB, gasping or wheezing  CV: no obvious cyanosis  PSYCH/NEURO: pleasant and cooperative, no obvious depression or anxiety, speech and thought processing grossly intact  ASSESSMENT AND PLAN:  Discussed the following assessment and plan:  Chronic back pain Followed by ortho - Dr  Mauri Pole.  On oxycodone.  Dr Mauri Pole prescribes.  Also having some pain - left knee and right hip.  Plans to f/u with ortho.    GERD (gastroesophageal reflux disease) Taking protonix.  Had discussed bid dosing.  Still with issues - acid reflux.  Needs GI evaluation for further w/up/evaluation and EGD.    Hypercholesterolemia On lipitor.  Low cholesterol diet and exercise.  Follow lipid panel and liver function tests.    Hypertension Blood pressure under good control.  Continue same medication regimen.  Follow pressures.  Follow metabolic panel.    Type 2 diabetes mellitus with neurological complications (HCC) Low carb diet and exercise.  a1c 7.0.  Discussed starting metformin.  Follow met b anda 1c.     Constipation Takes narcotic pain medication regularly.  Miralax.  GI referral as outlined.  Needs colonoscopy.    Orders Placed This Encounter  Procedures  . Ambulatory referral to Gastroenterology    Referral Priority:   Routine    Referral Type:   Consultation    Referral Reason:   Specialty Services Required    Number of Visits Requested:   1     I discussed the assessment and treatment plan with the patient. The patient was provided an opportunity to ask questions and all were answered. The patient agreed with the plan and demonstrated an understanding of the instructions.   The patient was advised to call back or seek an in-person evaluation if the symptoms worsen or if the condition fails to improve as anticipated.   Einar Pheasant, MD

## 2020-01-07 ENCOUNTER — Encounter: Payer: Self-pay | Admitting: Gastroenterology

## 2020-01-07 ENCOUNTER — Ambulatory Visit: Payer: Medicare Other | Admitting: Gastroenterology

## 2020-01-07 ENCOUNTER — Ambulatory Visit (INDEPENDENT_AMBULATORY_CARE_PROVIDER_SITE_OTHER): Payer: Medicare Other | Admitting: Gastroenterology

## 2020-01-07 DIAGNOSIS — K219 Gastro-esophageal reflux disease without esophagitis: Secondary | ICD-10-CM | POA: Diagnosis not present

## 2020-01-07 NOTE — Progress Notes (Addendum)
[facility] [place], [place] [address]  Main: [phone]  Fax: [phone]   Gastroenterology Consultation  Referring Provider:     Einar Pheasant, MD Primary Care Physician:  Einar Pheasant, MD Reason for Consultation:     GERD        HPI:   Virtual Visit via Video Note  I connected with patient on 01/07/20 at 10:30 AM EST by video ([email or web address]) and verified that I am speaking with the correct person using two identifiers.   I discussed the limitations, risks, security and privacy concerns of performing an evaluation and management service by video and the availability of in person appointments. I also discussed with the patient that there may be a patient responsible charge related to this service. The patient expressed understanding and agreed to proceed.  Location of the patient: Home Location of provider: Home Participating persons: Patient and provider only (Nursing staff checked in patient via phone but were not physically involved in the video interaction - see their notes)   History of Present Illness: Chief Complaint  Patient presents with  . New Patient (Initial Visit)  . Gastroesophageal Reflux    Patient states this constant.   . Constipation    Cameron Sanchez is a 49 y.o. y/o male referred for consultation & management  by Dr. Einar Pheasant, MD.  Patient reports chronic symptoms of reflux.  Especially at night.  Reports ongoing symptoms for years.  Wakes him up at least 2-3 times at night.  Symptoms are worse at night and he is okay during the day.  Taking Protonix 40 mg twice daily for at least 2 years and symptoms still continuing.  No worsening over the last couple years.  Has tried to avoid spicy foods as does trigger symptoms.  Has also tried to lose weight.  No dysphagia.  No unintentional weight loss.  No immediate family members with colon cancer.  reports having a colonoscopy and EGD when he was 34 or 49 years old.   Reports not available to [place]. Pt states they were normal.  Past Medical History:  Diagnosis Date  . Allergy   . Chronic back pain   . Hypercholesterolemia   . Hypertension   . Migraines     Past Surgical History:  Procedure Laterality Date  . BACK SURGERY  08/12/14   multiple  . CHOLECYSTECTOMY    . KNEE ARTHROSCOPY     Dr HBK    Prior to Admission medications   Medication Sig Start Date End Date Taking? Authorizing Provider  allopurinol (ZYLOPRIM) 300 MG tablet Take 1 tablet (300 mg total) by mouth daily. 12/20/19  Yes Einar Pheasant, MD  atorvastatin (LIPITOR) 40 MG tablet TAKE 1 TABLET BY MOUTH EVERY DAY 01/18/19  Yes Einar Pheasant, MD  carisoprodol (SOMA) 350 MG tablet Take 350 mg by mouth 4 (four) times daily as needed for muscle spasms.    Yes [provider]  metoprolol succinate (TOPROL-XL) 50 MG 24 hr tablet TAKE 2 TABLETS BY MOUTH DAILY WITH OR IMMEDIATELY FOLLOWING A MEAL. 12/07/19  Yes Einar Pheasant, MD  Oxycodone HCl 10 MG TABS 1 TAB(S) ORALLY EVERY 6 HOURS X 30 DAYS, AS NEEDED FILL ON OR AFTE 05/27/19 05/27/19  Yes [provider]  pantoprazole (PROTONIX) 40 MG tablet TAKE 1 TABLET (40 MG TOTAL) BY MOUTH 2 (TWO) TIMES DAILY BEFORE A MEAL. 12/03/19  Yes Einar Pheasant, MD  triamterene-hydrochlorothiazide (MAXZIDE-25) 37.5-25 MG tablet TAKE 1  TABLET BY MOUTH EVERY DAY 11/19/19  Yes Einar Pheasant, MD    Family History  Problem Relation Age of Onset  . Cancer Father        germ cell (retroperitoneal)  . Coronary artery disease Other        s/p stent (age 4)  . Hypertension Paternal Grandfather   . Kidney cancer Paternal Grandfather   . Diabetes Paternal Grandfather   . Diabetes Maternal Grandfather   . Heart disease Cousin        MI - age 31     Social History   Tobacco Use  . Smoking status: Never Smoker  . Smokeless tobacco: Never Used  Substance Use Topics  . Alcohol use: No    Alcohol/week: 0.0 standard drinks  . Drug use: No     Allergies as of 01/07/2020  . (No Known Allergies)    Review of Systems:    All systems reviewed and negative except where noted in HPI.   Observations/Objective:  Labs: CBC    Component Value Date/Time   WBC 9.4 06/21/2019 0949   RBC 4.90 06/21/2019 0949   HGB 14.3 06/21/2019 0949   HGB 15.7 09/13/2012 2026   HCT 44.1 06/21/2019 0949   HCT 45.3 09/13/2012 2026   PLT 170.0 06/21/2019 0949   PLT 214 09/13/2012 2026   MCV 90.0 06/21/2019 0949   MCV 87 09/13/2012 2026   MCH 30.2 01/31/2018 0001   MCHC 32.5 06/21/2019 0949   RDW 13.9 06/21/2019 0949   RDW 12.8 09/13/2012 2026   LYMPHSABS 3.4 06/21/2019 0949   LYMPHSABS 3.8 (H) 09/13/2012 2026   MONOABS 0.8 06/21/2019 0949   MONOABS 0.9 09/13/2012 2026   EOSABS 0.2 06/21/2019 0949   EOSABS 0.1 09/13/2012 2026   BASOSABS 0.1 06/21/2019 0949   BASOSABS 0.1 09/13/2012 2026   CMP     Component Value Date/Time   NA 133 (L) 11/20/2019 0940   NA 133 (L) 11/20/2019 0940   NA 137 09/13/2012 2026   K 4.2 11/20/2019 0940   K 3.7 09/13/2012 2026   CL 98 11/20/2019 0940   CL 100 09/13/2012 2026   CO2 24 11/20/2019 0940   CO2 27 09/13/2012 2026   GLUCOSE 119 (H) 11/20/2019 0940   GLUCOSE 94 09/13/2012 2026   BUN 11 11/20/2019 0940   BUN 10 09/13/2012 2026   CREATININE 0.88 11/20/2019 0940   CREATININE 1.16 02/18/2017 1547   CALCIUM 9.6 11/20/2019 0940   CALCIUM 9.7 09/13/2012 2026   PROT 6.9 11/20/2019 0940   PROT 8.7 (H) 09/13/2012 2026   ALBUMIN 4.4 11/20/2019 0940   ALBUMIN 4.8 09/13/2012 2026   AST 41 (H) 11/20/2019 0940   AST 27 09/13/2012 2026   ALT 41 11/20/2019 0940   ALT 71 09/13/2012 2026   ALKPHOS 90 11/20/2019 0940   ALKPHOS 63 09/13/2012 2026   BILITOT 0.9 11/20/2019 0940   BILITOT 1.5 (H) 09/13/2012 2026   GFRNONAA >60 01/31/2018 0001   GFRNONAA >60 09/13/2012 2026   GFRAA >60 01/31/2018 0001   GFRAA >60 09/13/2012 2026    Imaging Studies: No results found.  Assessment and Plan:    Cameron Sanchez is a 49 y.o. y/o male has been referred for GERD  Assessment and Plan: Symptoms occurring when laying down and clinical history consistent with GERD  Patient educated extensively on acid reflux lifestyle modification, including buying a bed wedge, not eating 3 hrs before bedtime, diet modifications, and handout given for the same.  We will discontinue Protonix and start omeprazole 20 mg twice daily.  Reassess in 3 to 4 weeks and can increase to 40 mg twice daily if needed or proceed with EGD as well.  Patient also due for screening colonoscopy and both EGD and colonoscopy can be done at the same time.  EGD would be due to chronic symptoms of reflux and unable to come off PPIs.  However, will await to see if symptoms are better controlled with above lifestyle modifications, using bed wedge, and changes in medications prior to EGD.  No alarm symptoms present to indicate urgent endoscopy at this time  (Risks of PPI use were discussed with patient including bone loss, C. Diff diarrhea, pneumonia, infections, CKD, electrolyte abnormalities.  If clinically possible based on symptoms, goal would be to maintain patient on the lowest dose possible, or discontinue the medication with institution of acid reflux lifestyle modifications over time. Pt. Verbalizes understanding and chooses to continue the medication.)   Follow Up Instructions: 4 to 6 weeks  I discussed the assessment and treatment plan with the patient. The patient was provided an opportunity to ask questions and all were answered. The patient agreed with the plan and demonstrated an understanding of the instructions.   The patient was advised to call back or seek an in-person evaluation if the symptoms worsen or if the condition fails to improve as anticipated.  I provided 15 minutes of face-to-face time via video software during this encounter.  Additional time was spent in reviewing patient's chart, placing orders  etc.   Virgel Manifold, MD  Speech recognition software was used to dictate the above note.

## 2020-01-08 ENCOUNTER — Encounter: Payer: Self-pay | Admitting: Internal Medicine

## 2020-01-08 DIAGNOSIS — K5900 Constipation, unspecified: Secondary | ICD-10-CM | POA: Insufficient documentation

## 2020-01-08 NOTE — Assessment & Plan Note (Signed)
Blood pressure under good control.  Continue same medication regimen.  Follow pressures.  Follow metabolic panel.   

## 2020-01-08 NOTE — Assessment & Plan Note (Signed)
Takes narcotic pain medication regularly.  Miralax.  GI referral as outlined.  Needs colonoscopy.

## 2020-01-08 NOTE — Assessment & Plan Note (Signed)
Low carb diet and exercise.  a1c 7.0.  Discussed starting metformin.  Follow met b anda 1c.

## 2020-01-08 NOTE — Assessment & Plan Note (Signed)
On lipitor.  Low cholesterol diet and exercise.  Follow lipid panel and liver function tests.   

## 2020-01-08 NOTE — Assessment & Plan Note (Signed)
Taking protonix.  Had discussed bid dosing.  Still with issues - acid reflux.  Needs GI evaluation for further w/up/evaluation and EGD.

## 2020-01-08 NOTE — Assessment & Plan Note (Signed)
Followed by ortho - Dr Mauri Pole.  On oxycodone.  Dr Mauri Pole prescribes.  Also having some pain - left knee and right hip.  Plans to f/u with ortho.

## 2020-02-17 ENCOUNTER — Other Ambulatory Visit: Payer: Self-pay | Admitting: Internal Medicine

## 2020-02-19 DIAGNOSIS — M549 Dorsalgia, unspecified: Secondary | ICD-10-CM | POA: Diagnosis not present

## 2020-02-19 DIAGNOSIS — M545 Low back pain: Secondary | ICD-10-CM | POA: Diagnosis not present

## 2020-02-25 DIAGNOSIS — M545 Low back pain: Secondary | ICD-10-CM | POA: Diagnosis not present

## 2020-02-25 DIAGNOSIS — M549 Dorsalgia, unspecified: Secondary | ICD-10-CM | POA: Diagnosis not present

## 2020-02-28 DIAGNOSIS — M545 Low back pain: Secondary | ICD-10-CM | POA: Diagnosis not present

## 2020-02-28 DIAGNOSIS — M549 Dorsalgia, unspecified: Secondary | ICD-10-CM | POA: Diagnosis not present

## 2020-03-03 DIAGNOSIS — M549 Dorsalgia, unspecified: Secondary | ICD-10-CM | POA: Diagnosis not present

## 2020-03-03 DIAGNOSIS — M545 Low back pain: Secondary | ICD-10-CM | POA: Diagnosis not present

## 2020-03-06 DIAGNOSIS — M549 Dorsalgia, unspecified: Secondary | ICD-10-CM | POA: Diagnosis not present

## 2020-03-06 DIAGNOSIS — M545 Low back pain: Secondary | ICD-10-CM | POA: Diagnosis not present

## 2020-03-10 ENCOUNTER — Other Ambulatory Visit: Payer: Self-pay | Admitting: Internal Medicine

## 2020-05-12 ENCOUNTER — Ambulatory Visit: Payer: Medicare Other

## 2020-05-12 ENCOUNTER — Telehealth: Payer: Self-pay

## 2020-05-12 NOTE — Telephone Encounter (Signed)
Failed attempt to reach patient for scheduled awv at preferred number. No answer. Unable to leave message. Please reschedule as appropriate.

## 2020-06-05 DIAGNOSIS — M961 Postlaminectomy syndrome, not elsewhere classified: Secondary | ICD-10-CM | POA: Diagnosis not present

## 2020-06-05 DIAGNOSIS — M4727 Other spondylosis with radiculopathy, lumbosacral region: Secondary | ICD-10-CM | POA: Diagnosis not present

## 2020-06-06 DIAGNOSIS — Z23 Encounter for immunization: Secondary | ICD-10-CM | POA: Diagnosis not present

## 2020-06-09 ENCOUNTER — Other Ambulatory Visit: Payer: Self-pay | Admitting: Internal Medicine

## 2020-08-02 ENCOUNTER — Other Ambulatory Visit: Payer: Self-pay | Admitting: Internal Medicine

## 2020-08-08 DIAGNOSIS — H0014 Chalazion left upper eyelid: Secondary | ICD-10-CM | POA: Diagnosis not present

## 2020-09-04 DIAGNOSIS — W010XXA Fall on same level from slipping, tripping and stumbling without subsequent striking against object, initial encounter: Secondary | ICD-10-CM | POA: Diagnosis not present

## 2020-09-04 DIAGNOSIS — S300XXA Contusion of lower back and pelvis, initial encounter: Secondary | ICD-10-CM | POA: Diagnosis not present

## 2020-09-04 DIAGNOSIS — M545 Low back pain, unspecified: Secondary | ICD-10-CM | POA: Diagnosis not present

## 2020-09-04 DIAGNOSIS — M961 Postlaminectomy syndrome, not elsewhere classified: Secondary | ICD-10-CM | POA: Diagnosis not present

## 2020-09-17 DIAGNOSIS — M961 Postlaminectomy syndrome, not elsewhere classified: Secondary | ICD-10-CM | POA: Diagnosis not present

## 2020-09-17 DIAGNOSIS — Z79899 Other long term (current) drug therapy: Secondary | ICD-10-CM | POA: Diagnosis not present

## 2020-10-02 ENCOUNTER — Other Ambulatory Visit: Payer: Self-pay | Admitting: Internal Medicine

## 2020-11-04 DIAGNOSIS — M7918 Myalgia, other site: Secondary | ICD-10-CM | POA: Diagnosis not present

## 2020-11-04 DIAGNOSIS — Z79891 Long term (current) use of opiate analgesic: Secondary | ICD-10-CM | POA: Diagnosis not present

## 2020-11-04 DIAGNOSIS — M792 Neuralgia and neuritis, unspecified: Secondary | ICD-10-CM | POA: Diagnosis not present

## 2020-11-04 DIAGNOSIS — M961 Postlaminectomy syndrome, not elsewhere classified: Secondary | ICD-10-CM | POA: Diagnosis not present

## 2020-11-15 DIAGNOSIS — I609 Nontraumatic subarachnoid hemorrhage, unspecified: Secondary | ICD-10-CM

## 2020-11-15 HISTORY — DX: Nontraumatic subarachnoid hemorrhage, unspecified: I60.9

## 2020-11-19 ENCOUNTER — Telehealth: Payer: Self-pay | Admitting: Internal Medicine

## 2020-11-19 NOTE — Telephone Encounter (Signed)
Left message for patient to call back and schedule Medicare Annual Wellness Visit (AWV)   This should be a virtual visit only=30 minutes.  No hx of AWV; please schedule at anytime with Denisa O'Brien-Blaney at [place] [place]   

## 2020-11-20 ENCOUNTER — Other Ambulatory Visit: Payer: Self-pay | Admitting: Internal Medicine

## 2020-11-29 ENCOUNTER — Encounter: Payer: Self-pay | Admitting: Emergency Medicine

## 2020-11-29 ENCOUNTER — Emergency Department
Admission: EM | Admit: 2020-11-29 | Discharge: 2020-11-30 | Disposition: A | Discharge status: Other Acute Inpatient Hospital | Payer: Medicare Other | Attending: Emergency Medicine | Admitting: Emergency Medicine

## 2020-11-29 ENCOUNTER — Emergency Department: Payer: Medicare Other

## 2020-11-29 DIAGNOSIS — D72829 Elevated white blood cell count, unspecified: Secondary | ICD-10-CM | POA: Diagnosis not present

## 2020-11-29 DIAGNOSIS — Z23 Encounter for immunization: Secondary | ICD-10-CM | POA: Diagnosis not present

## 2020-11-29 DIAGNOSIS — S2221XA Fracture of manubrium, initial encounter for closed fracture: Secondary | ICD-10-CM | POA: Insufficient documentation

## 2020-11-29 DIAGNOSIS — Y9289 Other specified places as the place of occurrence of the external cause: Secondary | ICD-10-CM | POA: Insufficient documentation

## 2020-11-29 DIAGNOSIS — I10 Essential (primary) hypertension: Secondary | ICD-10-CM | POA: Insufficient documentation

## 2020-11-29 DIAGNOSIS — S2242XA Multiple fractures of ribs, left side, initial encounter for closed fracture: Secondary | ICD-10-CM

## 2020-11-29 DIAGNOSIS — S3991XA Unspecified injury of abdomen, initial encounter: Secondary | ICD-10-CM | POA: Diagnosis not present

## 2020-11-29 DIAGNOSIS — E1149 Type 2 diabetes mellitus with other diabetic neurological complication: Secondary | ICD-10-CM | POA: Diagnosis not present

## 2020-11-29 DIAGNOSIS — S066X1A Traumatic subarachnoid hemorrhage with loss of consciousness of 30 minutes or less, initial encounter: Secondary | ICD-10-CM | POA: Insufficient documentation

## 2020-11-29 DIAGNOSIS — K766 Portal hypertension: Secondary | ICD-10-CM | POA: Diagnosis not present

## 2020-11-29 DIAGNOSIS — K7469 Other cirrhosis of liver: Secondary | ICD-10-CM | POA: Diagnosis not present

## 2020-11-29 DIAGNOSIS — R0789 Other chest pain: Secondary | ICD-10-CM | POA: Insufficient documentation

## 2020-11-29 DIAGNOSIS — Z79899 Other long term (current) drug therapy: Secondary | ICD-10-CM | POA: Diagnosis not present

## 2020-11-29 DIAGNOSIS — I700 Atherosclerosis of aorta: Secondary | ICD-10-CM | POA: Diagnosis not present

## 2020-11-29 DIAGNOSIS — Z20822 Contact with and (suspected) exposure to covid-19: Secondary | ICD-10-CM | POA: Diagnosis not present

## 2020-11-29 DIAGNOSIS — N400 Enlarged prostate without lower urinary tract symptoms: Secondary | ICD-10-CM | POA: Diagnosis not present

## 2020-11-29 DIAGNOSIS — K7460 Unspecified cirrhosis of liver: Secondary | ICD-10-CM | POA: Diagnosis not present

## 2020-11-29 DIAGNOSIS — M549 Dorsalgia, unspecified: Secondary | ICD-10-CM | POA: Diagnosis not present

## 2020-11-29 DIAGNOSIS — S066X0A Traumatic subarachnoid hemorrhage without loss of consciousness, initial encounter: Secondary | ICD-10-CM | POA: Diagnosis not present

## 2020-11-29 DIAGNOSIS — S0990XA Unspecified injury of head, initial encounter: Secondary | ICD-10-CM | POA: Diagnosis present

## 2020-11-29 MED ORDER — MORPHINE SULFATE (PF) 4 MG/ML IV SOLN
4.0000 mg | Freq: Once | INTRAVENOUS | Status: AC
  Administered 2020-11-29: 4 mg via INTRAVENOUS
  Filled 2020-11-29: qty 1

## 2020-11-29 MED ORDER — LEVETIRACETAM 500 MG PO TABS: 500.0000 mg | ORAL_TABLET | Freq: Two times a day (BID) | ORAL | 0 refills | Status: DC

## 2020-11-29 MED ORDER — LEVETIRACETAM 500 MG PO TABS
500.0000 mg | ORAL_TABLET | Freq: Once | ORAL | Status: AC
  Administered 2020-11-30: 500 mg via ORAL
  Filled 2020-11-29: qty 1

## 2020-11-29 MED ORDER — ONDANSETRON HCL 4 MG/2ML IJ SOLN
4.0000 mg | Freq: Four times a day (QID) | INTRAMUSCULAR | Status: DC | PRN
  Administered 2020-11-29: 4 mg via INTRAVENOUS
  Filled 2020-11-29: qty 2

## 2020-11-29 MED ORDER — TETANUS-DIPHTH-ACELL PERTUSSIS 5-2.5-18.5 LF-MCG/0.5 IM SUSY
0.5000 mL | PREFILLED_SYRINGE | Freq: Once | INTRAMUSCULAR | Status: AC
  Administered 2020-11-29: 0.5 mL via INTRAMUSCULAR
  Filled 2020-11-29: qty 0.5

## 2020-11-29 NOTE — ED Notes (Signed)
Iv team here 
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
## 2020-11-30 ENCOUNTER — Encounter (HOSPITAL_COMMUNITY): Payer: Self-pay | Admitting: General Surgery

## 2020-11-30 ENCOUNTER — Observation Stay (HOSPITAL_COMMUNITY)
Admission: AD | Admit: 2020-11-30 | Discharge: 2020-12-01 | Disposition: A | Discharge status: Home / Self Care | Payer: Medicare Other | Source: Other Acute Inpatient Hospital | Attending: General Surgery | Admitting: General Surgery

## 2020-11-30 ENCOUNTER — Other Ambulatory Visit: Payer: Self-pay

## 2020-11-30 ENCOUNTER — Inpatient Hospital Stay (HOSPITAL_COMMUNITY): Payer: Medicare Other

## 2020-11-30 ENCOUNTER — Emergency Department: Payer: Medicare Other

## 2020-11-30 DIAGNOSIS — Z8673 Personal history of transient ischemic attack (TIA), and cerebral infarction without residual deficits: Secondary | ICD-10-CM | POA: Diagnosis not present

## 2020-11-30 DIAGNOSIS — S3991XA Unspecified injury of abdomen, initial encounter: Secondary | ICD-10-CM | POA: Diagnosis not present

## 2020-11-30 DIAGNOSIS — Z79899 Other long term (current) drug therapy: Secondary | ICD-10-CM | POA: Diagnosis not present

## 2020-11-30 DIAGNOSIS — S0990XA Unspecified injury of head, initial encounter: Secondary | ICD-10-CM | POA: Diagnosis present

## 2020-11-30 DIAGNOSIS — I10 Essential (primary) hypertension: Secondary | ICD-10-CM | POA: Insufficient documentation

## 2020-11-30 DIAGNOSIS — E119 Type 2 diabetes mellitus without complications: Secondary | ICD-10-CM | POA: Diagnosis not present

## 2020-11-30 DIAGNOSIS — M549 Dorsalgia, unspecified: Secondary | ICD-10-CM | POA: Diagnosis not present

## 2020-11-30 DIAGNOSIS — S2239XA Fracture of one rib, unspecified side, initial encounter for closed fracture: Secondary | ICD-10-CM | POA: Diagnosis not present

## 2020-11-30 DIAGNOSIS — K7469 Other cirrhosis of liver: Secondary | ICD-10-CM | POA: Diagnosis not present

## 2020-11-30 DIAGNOSIS — Y92410 Unspecified street and highway as the place of occurrence of the external cause: Secondary | ICD-10-CM | POA: Diagnosis not present

## 2020-11-30 DIAGNOSIS — S2242XA Multiple fractures of ribs, left side, initial encounter for closed fracture: Secondary | ICD-10-CM | POA: Diagnosis not present

## 2020-11-30 DIAGNOSIS — I609 Nontraumatic subarachnoid hemorrhage, unspecified: Secondary | ICD-10-CM | POA: Diagnosis not present

## 2020-11-30 DIAGNOSIS — J3489 Other specified disorders of nose and nasal sinuses: Secondary | ICD-10-CM | POA: Diagnosis not present

## 2020-11-30 DIAGNOSIS — S066X0A Traumatic subarachnoid hemorrhage without loss of consciousness, initial encounter: Secondary | ICD-10-CM | POA: Insufficient documentation

## 2020-11-30 DIAGNOSIS — Z7984 Long term (current) use of oral hypoglycemic drugs: Secondary | ICD-10-CM | POA: Insufficient documentation

## 2020-11-30 DIAGNOSIS — S2221XA Fracture of manubrium, initial encounter for closed fracture: Secondary | ICD-10-CM | POA: Diagnosis not present

## 2020-11-30 DIAGNOSIS — K766 Portal hypertension: Secondary | ICD-10-CM | POA: Diagnosis not present

## 2020-11-30 LAB — COMPREHENSIVE METABOLIC PANEL
ALT: 42 U/L (ref 0–44)
ALT: 38 U/L (ref 0–44)
AST: 53 U/L — ABNORMAL HIGH (ref 15–41)
AST: 44 U/L — ABNORMAL HIGH (ref 15–41)
Albumin: 4.4 g/dL (ref 3.5–5.0)
Albumin: 3.8 g/dL (ref 3.5–5.0)
Alkaline Phosphatase: 75 U/L (ref 38–126)
Alkaline Phosphatase: 70 U/L (ref 38–126)
Anion gap: 10 (ref 5–15)
Anion gap: 11 (ref 5–15)
BUN: 11 mg/dL (ref 6–20)
BUN: 11 mg/dL (ref 6–20)
CO2: 26 mmol/L (ref 22–32)
CO2: 24 mmol/L (ref 22–32)
Calcium: 9.3 mg/dL (ref 8.9–10.3)
Calcium: 9.1 mg/dL (ref 8.9–10.3)
Chloride: 97 mmol/L — ABNORMAL LOW (ref 98–111)
Chloride: 98 mmol/L (ref 98–111)
Creatinine, Ser: 0.79 mg/dL (ref 0.61–1.24)
Creatinine, Ser: 0.91 mg/dL (ref 0.61–1.24)
GFR, Estimated: >60 mL/min (ref >60)
GFR, Estimated: >60 mL/min (ref >60)
Glucose, Bld: 185 mg/dL — ABNORMAL HIGH (ref 70–99)
Glucose, Bld: 249 mg/dL — ABNORMAL HIGH (ref 70–99)
Potassium: 3.9 mmol/L (ref 3.5–5.1)
Potassium: 4.1 mmol/L (ref 3.5–5.1)
Sodium: 133 mmol/L — ABNORMAL LOW (ref 135–145)
Sodium: 133 mmol/L — ABNORMAL LOW (ref 135–145)
Total Bilirubin: 0.8 mg/dL (ref 0.3–1.2)
Total Bilirubin: 0.9 mg/dL (ref 0.3–1.2)
Total Protein: 7.8 g/dL (ref 6.5–8.1)
Total Protein: 6.9 g/dL (ref 6.5–8.1)

## 2020-11-30 LAB — CBC
HCT: 40.3 % (ref 39.0–52.0)
Hemoglobin: 13.2 g/dL (ref 13.0–17.0)
MCH: 28.6 pg (ref 26.0–34.0)
MCHC: 32.8 g/dL (ref 30.0–36.0)
MCV: 87.4 fL (ref 80.0–100.0)
Platelets: 140 10*3/uL — ABNORMAL LOW (ref 150–400)
RBC: 4.61 MIL/uL (ref 4.22–5.81)
RDW: 13.5 % (ref 11.5–15.5)
WBC: 13.3 10*3/uL — ABNORMAL HIGH (ref 4.0–10.5)
nRBC: 0 % (ref 0.0–0.2)

## 2020-11-30 LAB — PHOSPHORUS: Phosphorus: 2.7 mg/dL (ref 2.5–4.6)

## 2020-11-30 LAB — TYPE AND SCREEN
ABO/RH(D): A POS
Antibody Screen: NEGATIVE

## 2020-11-30 LAB — GLUCOSE, CAPILLARY
Glucose-Capillary: 219 mg/dL — ABNORMAL HIGH (ref 70–99)
Glucose-Capillary: 226 mg/dL — ABNORMAL HIGH (ref 70–99)
Glucose-Capillary: 154 mg/dL — ABNORMAL HIGH (ref 70–99)
Glucose-Capillary: 177 mg/dL — ABNORMAL HIGH (ref 70–99)

## 2020-11-30 LAB — CBC WITH DIFFERENTIAL/PLATELET
Abs Immature Granulocytes: 0.07 10*3/uL (ref 0.00–0.07)
Basophils Absolute: 0 10*3/uL (ref 0.0–0.1)
Basophils Relative: 0 %
Eosinophils Absolute: 0 10*3/uL (ref 0.0–0.5)
Eosinophils Relative: 0 %
HCT: 40.7 % (ref 39.0–52.0)
Hemoglobin: 13.6 g/dL (ref 13.0–17.0)
Immature Granulocytes: 1 %
Lymphocytes Relative: 14 %
Lymphs Abs: 2 10*3/uL (ref 0.7–4.0)
MCH: 29.1 pg (ref 26.0–34.0)
MCHC: 33.4 g/dL (ref 30.0–36.0)
MCV: 87.2 fL (ref 80.0–100.0)
Monocytes Absolute: 0.9 10*3/uL (ref 0.1–1.0)
Monocytes Relative: 7 %
Neutro Abs: 11.3 10*3/uL — ABNORMAL HIGH (ref 1.7–7.7)
Neutrophils Relative %: 78 %
Platelets: 147 10*3/uL — ABNORMAL LOW (ref 150–400)
RBC: 4.67 MIL/uL (ref 4.22–5.81)
RDW: 13.6 % (ref 11.5–15.5)
WBC: 14.3 10*3/uL — ABNORMAL HIGH (ref 4.0–10.5)
nRBC: 0 % (ref 0.0–0.2)

## 2020-11-30 LAB — MAGNESIUM: Magnesium: 1.9 mg/dL (ref 1.7–2.4)

## 2020-11-30 LAB — HEMOGLOBIN A1C
Hgb A1c MFr Bld: 6.8 % — ABNORMAL HIGH (ref 4.8–5.6)
Mean Plasma Glucose: 148.46 mg/dL

## 2020-11-30 LAB — RESP PANEL BY RT-PCR (FLU A&B, COVID) ARPGX2
Influenza A by PCR: NEGATIVE
Influenza B by PCR: NEGATIVE
SARS Coronavirus 2 by RT PCR: NEGATIVE

## 2020-11-30 LAB — PROTIME-INR
INR: 1 (ref 0.8–1.2)
Prothrombin Time: 13.2 seconds (ref 11.4–15.2)

## 2020-11-30 LAB — HIV ANTIBODY (ROUTINE TESTING W REFLEX): HIV Screen 4th Generation wRfx: NONREACTIVE

## 2020-11-30 MED ORDER — ENOXAPARIN SODIUM 30 MG/0.3ML ~~LOC~~ SOLN: 30.0000 mg | Freq: Two times a day (BID) | SUBCUTANEOUS | Status: DC

## 2020-11-30 MED ORDER — METOPROLOL TARTRATE 5 MG/5ML IV SOLN: 5.0000 mg | Freq: Four times a day (QID) | INTRAVENOUS | Status: DC | PRN

## 2020-11-30 MED ORDER — ADULT MULTIVITAMIN W/MINERALS CH
1.0000 | ORAL_TABLET | Freq: Every day | ORAL | Status: DC
  Administered 2020-11-30 – 2020-12-01 (×2): 1 via ORAL
  Filled 2020-11-30 (×2): qty 1

## 2020-11-30 MED ORDER — CARISOPRODOL 350 MG PO TABS
350.0000 mg | ORAL_TABLET | Freq: Four times a day (QID) | ORAL | Status: DC | PRN
  Administered 2020-12-01: 350 mg via ORAL
  Filled 2020-11-30: qty 1

## 2020-11-30 MED ORDER — HYDROMORPHONE HCL 1 MG/ML IJ SOLN
1.0000 mg | INTRAMUSCULAR | Status: DC | PRN
Start: 2020-11-30 — End: 2020-12-01
  Administered 2020-11-30 (×3): 1 mg via INTRAVENOUS
  Filled 2020-11-30 (×3): qty 1

## 2020-11-30 MED ORDER — THIAMINE HCL 100 MG/ML IJ SOLN: 100.0000 mg | Freq: Every day | INTRAMUSCULAR | Status: DC

## 2020-11-30 MED ORDER — MORPHINE SULFATE (PF) 2 MG/ML IV SOLN
2.0000 mg | INTRAVENOUS | Status: DC | PRN
Start: 2020-11-30 — End: 2020-11-30
  Administered 2020-11-30: 2 mg via INTRAVENOUS
  Filled 2020-11-30: qty 1

## 2020-11-30 MED ORDER — IOHEXOL 300 MG/ML  SOLN
100.0000 mL | Freq: Once | INTRAMUSCULAR | Status: AC | PRN
  Administered 2020-11-30: 100 mL via INTRAVENOUS

## 2020-11-30 MED ORDER — DEXTROSE-NACL 5-0.45 % IV SOLN: INTRAVENOUS | Status: DC

## 2020-11-30 MED ORDER — PANTOPRAZOLE SODIUM 40 MG PO TBEC
40.0000 mg | DELAYED_RELEASE_TABLET | Freq: Every day | ORAL | Status: DC
  Administered 2020-11-30 – 2020-12-01 (×2): 40 mg via ORAL
  Filled 2020-11-30 (×2): qty 1

## 2020-11-30 MED ORDER — GABAPENTIN 100 MG PO CAPS
100.0000 mg | ORAL_CAPSULE | Freq: Three times a day (TID) | ORAL | Status: DC
  Administered 2020-11-30 – 2020-12-01 (×4): 100 mg via ORAL
  Filled 2020-11-30 (×4): qty 1

## 2020-11-30 MED ORDER — ONDANSETRON HCL 4 MG/2ML IJ SOLN: 4.0000 mg | Freq: Four times a day (QID) | INTRAMUSCULAR | Status: DC | PRN

## 2020-11-30 MED ORDER — HYDROMORPHONE HCL 1 MG/ML IJ SOLN
INTRAMUSCULAR | Status: AC
  Filled 2020-11-30: qty 1

## 2020-11-30 MED ORDER — ACETAMINOPHEN 325 MG PO TABS
650.0000 mg | ORAL_TABLET | Freq: Four times a day (QID) | ORAL | Status: DC
  Administered 2020-11-30 – 2020-12-01 (×4): 650 mg via ORAL
  Filled 2020-11-30 (×4): qty 2

## 2020-11-30 MED ORDER — THIAMINE HCL 100 MG PO TABS
100.0000 mg | ORAL_TABLET | Freq: Every day | ORAL | Status: DC
  Administered 2020-11-30 – 2020-12-01 (×2): 100 mg via ORAL
  Filled 2020-11-30 (×2): qty 1

## 2020-11-30 MED ORDER — LORAZEPAM 1 MG PO TABS: 1.0000 mg | ORAL_TABLET | ORAL | Status: DC | PRN

## 2020-11-30 MED ORDER — OXYCODONE HCL 5 MG PO TABS: 5.0000 mg | ORAL_TABLET | ORAL | Status: DC | PRN

## 2020-11-30 MED ORDER — ONDANSETRON 4 MG PO TBDP: 4.0000 mg | ORAL_TABLET | Freq: Four times a day (QID) | ORAL | Status: DC | PRN

## 2020-11-30 MED ORDER — OXYCODONE HCL 5 MG PO TABS
10.0000 mg | ORAL_TABLET | ORAL | Status: DC | PRN
Start: 2020-11-30 — End: 2020-12-01
  Administered 2020-11-30 – 2020-12-01 (×6): 10 mg via ORAL
  Filled 2020-11-30 (×6): qty 2

## 2020-11-30 MED ORDER — LORAZEPAM 2 MG/ML IJ SOLN: 1.0000 mg | INTRAMUSCULAR | Status: DC | PRN

## 2020-11-30 MED ORDER — LEVETIRACETAM 500 MG PO TABS
500.0000 mg | ORAL_TABLET | Freq: Two times a day (BID) | ORAL | Status: DC
  Administered 2020-11-30 – 2020-12-01 (×3): 500 mg via ORAL
  Filled 2020-11-30 (×3): qty 1

## 2020-11-30 MED ORDER — INSULIN ASPART 100 UNIT/ML ~~LOC~~ SOLN
0.0000 [IU] | SUBCUTANEOUS | Status: DC
  Administered 2020-11-30: 3 [IU] via SUBCUTANEOUS
  Administered 2020-11-30: 2 [IU] via SUBCUTANEOUS
  Administered 2020-11-30 (×2): 5 [IU] via SUBCUTANEOUS
  Administered 2020-12-01: 3 [IU] via SUBCUTANEOUS
  Administered 2020-12-01: 2 [IU] via SUBCUTANEOUS
  Administered 2020-12-01: 5 [IU] via SUBCUTANEOUS

## 2020-11-30 MED ORDER — FOLIC ACID 1 MG PO TABS
1.0000 mg | ORAL_TABLET | Freq: Every day | ORAL | Status: DC
  Administered 2020-11-30 – 2020-12-01 (×2): 1 mg via ORAL
  Filled 2020-11-30 (×2): qty 1

## 2020-11-30 MED ORDER — ACETAMINOPHEN 325 MG PO TABS
650.0000 mg | ORAL_TABLET | ORAL | Status: DC | PRN
  Administered 2020-11-30: 650 mg via ORAL
  Filled 2020-11-30: qty 2

## 2020-11-30 MED ORDER — HYDROMORPHONE HCL 1 MG/ML IJ SOLN
1.0000 mg | Freq: Once | INTRAMUSCULAR | Status: AC
  Administered 2020-11-30: 1 mg via INTRAVENOUS

## 2020-11-30 NOTE — ED Notes (Signed)
Patient transported to CT 

## 2020-11-30 NOTE — ED Notes (Signed)
Consent for transfer on paper consent form, placed in chart.
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
## 2020-11-30 NOTE — ED Notes (Signed)
Pt returned from ct. Pt states no improvement in pain after morphine, will notify MD as soon as MD available.

## 2020-11-30 NOTE — ED Notes (Signed)
Pt and spouse updated on transfer process
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
## 2020-11-30 NOTE — ED Notes (Signed)
md notified of ct resulted and pt requesting more pain medication. md in to speak with pt.
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
## 2020-12-01 DIAGNOSIS — Z79899 Other long term (current) drug therapy: Secondary | ICD-10-CM | POA: Diagnosis not present

## 2020-12-01 DIAGNOSIS — S2242XA Multiple fractures of ribs, left side, initial encounter for closed fracture: Secondary | ICD-10-CM | POA: Diagnosis not present

## 2020-12-01 DIAGNOSIS — S2221XA Fracture of manubrium, initial encounter for closed fracture: Secondary | ICD-10-CM | POA: Diagnosis not present

## 2020-12-01 DIAGNOSIS — S066X0A Traumatic subarachnoid hemorrhage without loss of consciousness, initial encounter: Secondary | ICD-10-CM | POA: Diagnosis not present

## 2020-12-01 DIAGNOSIS — I10 Essential (primary) hypertension: Secondary | ICD-10-CM | POA: Diagnosis not present

## 2020-12-01 DIAGNOSIS — E119 Type 2 diabetes mellitus without complications: Secondary | ICD-10-CM | POA: Diagnosis not present

## 2020-12-01 DIAGNOSIS — S2239XA Fracture of one rib, unspecified side, initial encounter for closed fracture: Secondary | ICD-10-CM | POA: Diagnosis not present

## 2020-12-01 LAB — GLUCOSE, CAPILLARY
Glucose-Capillary: 116 mg/dL — ABNORMAL HIGH (ref 70–99)
Glucose-Capillary: 142 mg/dL — ABNORMAL HIGH (ref 70–99)
Glucose-Capillary: 189 mg/dL — ABNORMAL HIGH (ref 70–99)
Glucose-Capillary: 217 mg/dL — ABNORMAL HIGH (ref 70–99)

## 2020-12-01 MED ORDER — ACETAMINOPHEN 325 MG PO TABS: 650.0000 mg | ORAL_TABLET | Freq: Four times a day (QID) | ORAL | Status: DC

## 2020-12-01 MED ORDER — METOPROLOL SUCCINATE ER 50 MG PO TB24
100.0000 mg | ORAL_TABLET | Freq: Every day | ORAL | Status: DC
  Administered 2020-12-01: 100 mg via ORAL
  Filled 2020-12-01 (×2): qty 2

## 2020-12-01 MED ORDER — GABAPENTIN 100 MG PO CAPS: 100.0000 mg | ORAL_CAPSULE | Freq: Three times a day (TID) | ORAL | 0 refills | Status: DC | PRN

## 2020-12-01 NOTE — Care Management CC44 (Signed)
Condition Code 44 Documentation Completed  Patient Details  Name: Cameron Sanchez MRN: [id number] Date of Birth: 02/15/1971   Condition Code 44 given:  Yes Patient signature on Condition Code 44 notice:  Yes Documentation of 2 MD's agreement:  Yes Code 44 added to claim:  Yes    Ella Bodo, RN 12/01/2020, 12:54 PM
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
## 2020-12-01 NOTE — Progress Notes (Signed)
dsicharbge summary packet provided to pt with instructions.Pt verbalized understanding of instructions. All questions and concerns were fully answered. Pt D/C to home as ordered. Pt remains alert/oriented in no apparent distress. No complaints.
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
## 2020-12-01 NOTE — Care Management Obs Status (Signed)
MEDICARE OBSERVATION STATUS NOTIFICATION   Patient Details  Name: Cameron Sanchez MRN: [id number] Date of Birth: 1971/10/23   Medicare Observation Status Notification Given:  Yes    Ella Bodo, RN 12/01/2020, 12:50 PM   Reinaldo Raddle, RN, BSN  Trauma/Neuro ICU Case Manager [phone]

## 2020-12-01 NOTE — Plan of Care (Signed)
  Problem: Education: Goal: Knowledge of General Education information will improve Description: Including pain rating scale, medication(s)/side effects and non-pharmacologic comfort measures Outcome: Progressing   Problem: Clinical Measurements: Goal: Ability to maintain clinical measurements within normal limits will improve Outcome: Progressing Goal: Will remain free from infection Outcome: Progressing Goal: Diagnostic test results will improve Outcome: Progressing Goal: Respiratory complications will improve Outcome: Progressing Goal: Cardiovascular complication will be avoided Outcome: Progressing   Problem: Activity: Goal: Risk for activity intolerance will decrease Outcome: Progressing   Problem: Nutrition: Goal: Adequate nutrition will be maintained Outcome: Progressing   Problem: Coping: Goal: Level of anxiety will decrease Outcome: Progressing   Problem: Elimination: Goal: Will not experience complications related to bowel motility Outcome: Progressing   Problem: Pain Managment: Goal: General experience of comfort will improve Outcome: Progressing   Problem: Safety: Goal: Ability to remain free from injury will improve Outcome: Progressing   Problem: Skin Integrity: Goal: Risk for impaired skin integrity will decrease Outcome: Progressing   

## 2020-12-02 ENCOUNTER — Telehealth: Payer: Self-pay

## 2020-12-02 DIAGNOSIS — M961 Postlaminectomy syndrome, not elsewhere classified: Secondary | ICD-10-CM | POA: Diagnosis not present

## 2020-12-02 DIAGNOSIS — M7918 Myalgia, other site: Secondary | ICD-10-CM | POA: Diagnosis not present

## 2020-12-02 DIAGNOSIS — Z79891 Long term (current) use of opiate analgesic: Secondary | ICD-10-CM | POA: Diagnosis not present

## 2020-12-02 DIAGNOSIS — G8929 Other chronic pain: Secondary | ICD-10-CM | POA: Diagnosis not present
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
## 2020-12-04 ENCOUNTER — Other Ambulatory Visit: Payer: Self-pay | Admitting: Internal Medicine

## 2020-12-05 NOTE — Telephone Encounter (Signed)
rx sent in for metoprolol.  Needs to keep f/u appt.

## 2020-12-08 DIAGNOSIS — S43421A Sprain of right rotator cuff capsule, initial encounter: Secondary | ICD-10-CM | POA: Diagnosis not present

## 2020-12-08 DIAGNOSIS — M25551 Pain in right hip: Secondary | ICD-10-CM | POA: Diagnosis not present

## 2020-12-12 ENCOUNTER — Encounter: Payer: Self-pay | Admitting: Internal Medicine

## 2020-12-12 ENCOUNTER — Telehealth (INDEPENDENT_AMBULATORY_CARE_PROVIDER_SITE_OTHER): Payer: Medicare Other | Admitting: Internal Medicine

## 2020-12-12 DIAGNOSIS — I10 Essential (primary) hypertension: Secondary | ICD-10-CM | POA: Diagnosis not present

## 2020-12-12 DIAGNOSIS — E7800 Pure hypercholesterolemia, unspecified: Secondary | ICD-10-CM

## 2020-12-12 DIAGNOSIS — S066X0D Traumatic subarachnoid hemorrhage without loss of consciousness, subsequent encounter: Secondary | ICD-10-CM | POA: Diagnosis not present

## 2020-12-12 DIAGNOSIS — S2239XD Fracture of one rib, unspecified side, subsequent encounter for fracture with routine healing: Secondary | ICD-10-CM

## 2020-12-12 DIAGNOSIS — E1149 Type 2 diabetes mellitus with other diabetic neurological complication: Secondary | ICD-10-CM | POA: Diagnosis not present

## 2020-12-12 MED ORDER — TRIAMTERENE-HCTZ 37.5-25 MG PO TABS: 1.0000 | ORAL_TABLET | Freq: Every day | ORAL | 0 refills | Status: DC
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
## 2020-12-13 ENCOUNTER — Encounter: Payer: Self-pay | Admitting: Internal Medicine

## 2020-12-13 DIAGNOSIS — S066X9A Traumatic subarachnoid hemorrhage with loss of consciousness of unspecified duration, initial encounter: Secondary | ICD-10-CM | POA: Insufficient documentation

## 2020-12-13 DIAGNOSIS — Z8679 Personal history of other diseases of the circulatory system: Secondary | ICD-10-CM | POA: Insufficient documentation
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
## 2020-12-13 NOTE — Assessment & Plan Note (Signed)
On triam/hctz.  Follow pressures.  Follow metabolic panel. 

## 2020-12-13 NOTE — Assessment & Plan Note (Signed)
Discussed sugar elevation and diabetes.  Low carb diet and exercise.  Follow met b and a1c.

## 2020-12-13 NOTE — Assessment & Plan Note (Signed)
On lipitor.  Low cholesterol diet and exercise.  Follow lipid panel and liver function tests.   
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
## 2020-12-19 DIAGNOSIS — M25611 Stiffness of right shoulder, not elsewhere classified: Secondary | ICD-10-CM | POA: Diagnosis not present

## 2020-12-19 DIAGNOSIS — M25511 Pain in right shoulder: Secondary | ICD-10-CM | POA: Diagnosis not present

## 2020-12-19 DIAGNOSIS — M545 Low back pain, unspecified: Secondary | ICD-10-CM | POA: Diagnosis not present

## 2020-12-29 DIAGNOSIS — M25511 Pain in right shoulder: Secondary | ICD-10-CM | POA: Diagnosis not present

## 2020-12-30 DIAGNOSIS — Z79891 Long term (current) use of opiate analgesic: Secondary | ICD-10-CM | POA: Diagnosis not present

## 2020-12-30 DIAGNOSIS — S2249XS Multiple fractures of ribs, unspecified side, sequela: Secondary | ICD-10-CM | POA: Diagnosis not present

## 2020-12-30 DIAGNOSIS — R0781 Pleurodynia: Secondary | ICD-10-CM | POA: Diagnosis not present

## 2020-12-30 DIAGNOSIS — M961 Postlaminectomy syndrome, not elsewhere classified: Secondary | ICD-10-CM | POA: Diagnosis not present

## 2021-01-06 DIAGNOSIS — M129 Arthropathy, unspecified: Secondary | ICD-10-CM | POA: Diagnosis not present

## 2021-01-06 DIAGNOSIS — M7551 Bursitis of right shoulder: Secondary | ICD-10-CM | POA: Diagnosis not present

## 2021-01-06 DIAGNOSIS — M25511 Pain in right shoulder: Secondary | ICD-10-CM | POA: Diagnosis not present

## 2021-01-06 DIAGNOSIS — M67813 Other specified disorders of tendon, right shoulder: Secondary | ICD-10-CM | POA: Diagnosis not present

## 2021-01-07 DIAGNOSIS — M25511 Pain in right shoulder: Secondary | ICD-10-CM | POA: Insufficient documentation

## 2021-01-09 ENCOUNTER — Ambulatory Visit: Payer: Medicare Other | Admitting: Internal Medicine

## 2021-01-13 ENCOUNTER — Encounter: Payer: Self-pay | Admitting: Internal Medicine

## 2021-01-13 ENCOUNTER — Ambulatory Visit (INDEPENDENT_AMBULATORY_CARE_PROVIDER_SITE_OTHER): Payer: Medicare Other | Admitting: Internal Medicine

## 2021-01-13 ENCOUNTER — Other Ambulatory Visit: Payer: Self-pay

## 2021-01-13 VITALS — BP 130/80 | HR 80 | Temp 97.8°F | Resp 16 | Ht 68.0 in | Wt 226.0 lb

## 2021-01-13 DIAGNOSIS — K219 Gastro-esophageal reflux disease without esophagitis: Secondary | ICD-10-CM

## 2021-01-13 DIAGNOSIS — I700 Atherosclerosis of aorta: Secondary | ICD-10-CM | POA: Diagnosis not present

## 2021-01-13 DIAGNOSIS — Z1159 Encounter for screening for other viral diseases: Secondary | ICD-10-CM

## 2021-01-13 DIAGNOSIS — K5900 Constipation, unspecified: Secondary | ICD-10-CM | POA: Diagnosis not present

## 2021-01-13 DIAGNOSIS — K7460 Unspecified cirrhosis of liver: Secondary | ICD-10-CM

## 2021-01-13 DIAGNOSIS — E7800 Pure hypercholesterolemia, unspecified: Secondary | ICD-10-CM

## 2021-01-13 DIAGNOSIS — S2239XD Fracture of one rib, unspecified side, subsequent encounter for fracture with routine healing: Secondary | ICD-10-CM | POA: Diagnosis not present

## 2021-01-13 DIAGNOSIS — E1149 Type 2 diabetes mellitus with other diabetic neurological complication: Secondary | ICD-10-CM

## 2021-01-13 DIAGNOSIS — Z8679 Personal history of other diseases of the circulatory system: Secondary | ICD-10-CM | POA: Diagnosis not present

## 2021-01-13 DIAGNOSIS — F439 Reaction to severe stress, unspecified: Secondary | ICD-10-CM | POA: Diagnosis not present

## 2021-01-13 DIAGNOSIS — M545 Low back pain, unspecified: Secondary | ICD-10-CM

## 2021-01-13 DIAGNOSIS — I10 Essential (primary) hypertension: Secondary | ICD-10-CM | POA: Diagnosis not present

## 2021-01-13 DIAGNOSIS — G8929 Other chronic pain: Secondary | ICD-10-CM

## 2021-01-13 LAB — BASIC METABOLIC PANEL
BUN: 13 mg/dL (ref 6–23)
CO2: 24 mEq/L (ref 19–32)
Calcium: 9.2 mg/dL (ref 8.4–10.5)
Chloride: 99 mEq/L (ref 96–112)
Creatinine, Ser: 0.84 mg/dL (ref 0.40–1.50)
GFR: 100.84 mL/min (ref >60.00)
Glucose, Bld: 217 mg/dL — ABNORMAL HIGH (ref 70–99)
Potassium: 3.8 mEq/L (ref 3.5–5.1)
Sodium: 132 mEq/L — ABNORMAL LOW (ref 135–145)

## 2021-01-13 LAB — CBC WITH DIFFERENTIAL/PLATELET
Basophils Absolute: 0 10*3/uL (ref 0.0–0.1)
Basophils Relative: 0.5 % (ref 0.0–3.0)
Eosinophils Absolute: 0.1 10*3/uL (ref 0.0–0.7)
Eosinophils Relative: 2.2 % (ref 0.0–5.0)
HCT: 38.8 % — ABNORMAL LOW (ref 39.0–52.0)
Hemoglobin: 12.9 g/dL — ABNORMAL LOW (ref 13.0–17.0)
Lymphocytes Relative: 38.6 % (ref 12.0–46.0)
Lymphs Abs: 2.6 10*3/uL (ref 0.7–4.0)
MCHC: 33.3 g/dL (ref 30.0–36.0)
MCV: 87.7 fl (ref 78.0–100.0)
Monocytes Absolute: 0.6 10*3/uL (ref 0.1–1.0)
Monocytes Relative: 8.3 % (ref 3.0–12.0)
Neutro Abs: 3.4 10*3/uL (ref 1.4–7.7)
Neutrophils Relative %: 50.4 % (ref 43.0–77.0)
Platelets: 119 10*3/uL — ABNORMAL LOW (ref 150.0–400.0)
RBC: 4.43 Mil/uL (ref 4.22–5.81)
RDW: 14.4 % (ref 11.5–15.5)
WBC: 6.8 10*3/uL (ref 4.0–10.5)

## 2021-01-13 LAB — HEPATIC FUNCTION PANEL
ALT: 34 U/L (ref 0–53)
AST: 35 U/L (ref 0–37)
Albumin: 4.2 g/dL (ref 3.5–5.2)
Alkaline Phosphatase: 107 U/L (ref 39–117)
Bilirubin, Direct: 0.2 mg/dL (ref 0.0–0.3)
Total Bilirubin: 1.3 mg/dL — ABNORMAL HIGH (ref 0.2–1.2)
Total Protein: 6.8 g/dL (ref 6.0–8.3)

## 2021-01-13 LAB — LIPID PANEL
Cholesterol: 104 mg/dL (ref 0–200)
HDL: 40.4 mg/dL (ref >39.00)
LDL Cholesterol: 38 mg/dL (ref 0–99)
NonHDL: 63.8
Total CHOL/HDL Ratio: 3
Triglycerides: 129 mg/dL (ref 0.0–149.0)
VLDL: 25.8 mg/dL (ref 0.0–40.0)

## 2021-01-13 LAB — MICROALBUMIN / CREATININE URINE RATIO
Creatinine,U: 111.1 mg/dL
Microalb Creat Ratio: 0.6 mg/g (ref 0.0–30.0)
Microalb, Ur: <0.7 mg/dL (ref 0.0–1.9)
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
## 2021-01-14 LAB — HEPATITIS C ANTIBODY
Hepatitis C Ab: NONREACTIVE
SIGNAL TO CUT-OFF: 0.01 (ref <1.00)

## 2021-01-15 ENCOUNTER — Encounter: Payer: Self-pay | Admitting: Internal Medicine

## 2021-01-16 ENCOUNTER — Other Ambulatory Visit: Payer: Self-pay | Admitting: Internal Medicine

## 2021-01-16 DIAGNOSIS — K7460 Unspecified cirrhosis of liver: Secondary | ICD-10-CM | POA: Insufficient documentation

## 2021-01-16 DIAGNOSIS — D649 Anemia, unspecified: Secondary | ICD-10-CM

## 2021-01-16 DIAGNOSIS — I10 Essential (primary) hypertension: Secondary | ICD-10-CM

## 2021-01-16 DIAGNOSIS — E1149 Type 2 diabetes mellitus with other diabetic neurological complication: Secondary | ICD-10-CM

## 2021-01-16 DIAGNOSIS — I700 Atherosclerosis of aorta: Secondary | ICD-10-CM | POA: Insufficient documentation

## 2021-01-16 NOTE — Assessment & Plan Note (Signed)
Continue lipitor  ?

## 2021-01-16 NOTE — Assessment & Plan Note (Signed)
Blood pressure doing well.  Continue triam/hctz.  Follow pressures.  Follow metabolic panel.  
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
## 2021-01-16 NOTE — Assessment & Plan Note (Signed)
Noted on recent CT.  Check liver panel and PT/INR today.  Recent decreased platelet count.  Recheck cbc.  Refer to GI for further evaluation.

## 2021-01-16 NOTE — Assessment & Plan Note (Signed)
Taking protonix.  Still with acid reflux.  Not taking protonix bid secondary to cost.  Discussed the need for GI evaluation given persistent reflux despite medication.  Discussed taking protonix n the am and pepcid in the pm.  Refer to GI for further evaluation and question of need for EGD.
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
## 2021-01-16 NOTE — Assessment & Plan Note (Signed)
Takes narcotic pain medication regularly.  Have discussed bowel regimen.  Needs colonoscopy.

## 2021-01-16 NOTE — Assessment & Plan Note (Signed)
Has been followed by Dr Mauri Pole - ortho.  Now seeing pain clinic.  On oxycodone - scheduled.

## 2021-01-16 NOTE — Progress Notes (Signed)
Orders placed for f/u labs.  

## 2021-01-16 NOTE — Assessment & Plan Note (Signed)
Discussed.  Handling things relatively well.  Follow.

## 2021-01-16 NOTE — Assessment & Plan Note (Signed)
On lipitor.  Low cholesterol diet and exercise.  Follow lipid panel and liver function tests.   

## 2021-01-16 NOTE — Assessment & Plan Note (Signed)
Discussed low carb diet and exercise.  Check met b and a1c.  

## 2021-01-20 ENCOUNTER — Telehealth: Payer: Self-pay | Admitting: Internal Medicine

## 2021-01-20 NOTE — Telephone Encounter (Signed)
See result note.  

## 2021-01-20 NOTE — Telephone Encounter (Signed)
Pt called returning a call about lab results  

## 2021-01-21 ENCOUNTER — Emergency Department: Payer: Medicare Other

## 2021-01-21 ENCOUNTER — Encounter: Payer: Self-pay | Admitting: Emergency Medicine

## 2021-01-21 ENCOUNTER — Other Ambulatory Visit: Payer: Self-pay

## 2021-01-21 ENCOUNTER — Emergency Department
Admission: EM | Admit: 2021-01-21 | Discharge: 2021-01-21 | Disposition: A | Discharge status: Home / Self Care | Payer: Medicare Other | Attending: Emergency Medicine | Admitting: Emergency Medicine

## 2021-01-21 ENCOUNTER — Telehealth: Payer: Self-pay

## 2021-01-21 DIAGNOSIS — Z8679 Personal history of other diseases of the circulatory system: Secondary | ICD-10-CM | POA: Diagnosis not present

## 2021-01-21 DIAGNOSIS — I10 Essential (primary) hypertension: Secondary | ICD-10-CM | POA: Diagnosis not present

## 2021-01-21 DIAGNOSIS — Z79899 Other long term (current) drug therapy: Secondary | ICD-10-CM | POA: Diagnosis not present

## 2021-01-21 DIAGNOSIS — R0602 Shortness of breath: Secondary | ICD-10-CM | POA: Diagnosis not present

## 2021-01-21 DIAGNOSIS — R079 Chest pain, unspecified: Secondary | ICD-10-CM | POA: Diagnosis not present

## 2021-01-21 DIAGNOSIS — R0789 Other chest pain: Secondary | ICD-10-CM | POA: Diagnosis not present

## 2021-01-21 DIAGNOSIS — E1140 Type 2 diabetes mellitus with diabetic neuropathy, unspecified: Secondary | ICD-10-CM | POA: Insufficient documentation

## 2021-01-21 LAB — BASIC METABOLIC PANEL
Anion gap: 8 (ref 5–15)
BUN: 12 mg/dL (ref 6–20)
CO2: 26 mmol/L (ref 22–32)
Calcium: 9.4 mg/dL (ref 8.9–10.3)
Chloride: 103 mmol/L (ref 98–111)
Creatinine, Ser: 0.87 mg/dL (ref 0.61–1.24)
GFR, Estimated: >60 mL/min (ref >60)
Glucose, Bld: 150 mg/dL — ABNORMAL HIGH (ref 70–99)
Potassium: 4.6 mmol/L (ref 3.5–5.1)
Sodium: 137 mmol/L (ref 135–145)

## 2021-01-21 LAB — CBC
HCT: 41.1 % (ref 39.0–52.0)
Hemoglobin: 13.6 g/dL (ref 13.0–17.0)
MCH: 29 pg (ref 26.0–34.0)
MCHC: 33.1 g/dL (ref 30.0–36.0)
MCV: 87.6 fL (ref 80.0–100.0)
Platelets: 147 10*3/uL — ABNORMAL LOW (ref 150–400)
RBC: 4.69 MIL/uL (ref 4.22–5.81)
RDW: 13.6 % (ref 11.5–15.5)
WBC: 8.4 10*3/uL (ref 4.0–10.5)
nRBC: 0 % (ref 0.0–0.2)

## 2021-01-21 LAB — TROPONIN I (HIGH SENSITIVITY): Troponin I (High Sensitivity): 5 ng/L (ref <18)

## 2021-01-21 MED ORDER — IOHEXOL 350 MG/ML SOLN
75.0000 mL | Freq: Once | INTRAVENOUS | Status: AC | PRN
  Administered 2021-01-21: 75 mL via INTRAVENOUS

## 2021-01-21 NOTE — Telephone Encounter (Signed)
Patient was advised by Access Nurse to call 911 and to go to ER now. Patient stated he awoke with SOB and chest pain different from what he has had and that the pain is in the center of his chest.  Hard to breathe with the pain. Patient confirmed EMS was on their way.

## 2021-01-21 NOTE — Telephone Encounter (Signed)
Agree with evaluation now for chest pain and sob.
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
## 2021-01-21 NOTE — ED Triage Notes (Addendum)
Pt comes into the ED via [place] and chest pain that started at 3:00 am this morning.  Pt denies any COPD, asthma, or CHF.  Pt in NAD at this time with even and unlabored respirations.  Pt denies any cough or any other symptoms other than some mild nausea.  Pt denies any cardiac history other than HTN.

## 2021-01-22 DIAGNOSIS — M25511 Pain in right shoulder: Secondary | ICD-10-CM | POA: Diagnosis not present

## 2021-01-22 DIAGNOSIS — Z79891 Long term (current) use of opiate analgesic: Secondary | ICD-10-CM | POA: Diagnosis not present

## 2021-01-22 DIAGNOSIS — M5416 Radiculopathy, lumbar region: Secondary | ICD-10-CM | POA: Diagnosis not present

## 2021-01-22 DIAGNOSIS — G8929 Other chronic pain: Secondary | ICD-10-CM | POA: Diagnosis not present

## 2021-02-02 ENCOUNTER — Other Ambulatory Visit: Payer: Medicare Other

## 2021-02-11 ENCOUNTER — Other Ambulatory Visit: Payer: Medicare Other

## 2021-02-11 ENCOUNTER — Telehealth (INDEPENDENT_AMBULATORY_CARE_PROVIDER_SITE_OTHER): Payer: Medicare Other | Admitting: Internal Medicine

## 2021-02-11 ENCOUNTER — Encounter: Payer: Self-pay | Admitting: Internal Medicine

## 2021-02-11 ENCOUNTER — Other Ambulatory Visit: Payer: Self-pay

## 2021-02-11 VITALS — Ht 67.0 in | Wt 221.0 lb

## 2021-02-11 DIAGNOSIS — R0981 Nasal congestion: Secondary | ICD-10-CM | POA: Diagnosis not present

## 2021-02-11 DIAGNOSIS — Z20822 Contact with and (suspected) exposure to covid-19: Secondary | ICD-10-CM

## 2021-02-11 DIAGNOSIS — R0982 Postnasal drip: Secondary | ICD-10-CM

## 2021-02-11 DIAGNOSIS — J309 Allergic rhinitis, unspecified: Secondary | ICD-10-CM

## 2021-02-11 MED ORDER — SALINE SPRAY 0.65 % NA SOLN: 2.0000 | Freq: Every day | NASAL | 2 refills | Status: DC | PRN

## 2021-02-11 MED ORDER — FLUTICASONE PROPIONATE 50 MCG/ACT NA SUSP: 2.0000 | Freq: Every day | NASAL | 2 refills | Status: DC | PRN

## 2021-02-11 MED ORDER — AZITHROMYCIN 250 MG PO TABS: ORAL_TABLET | ORAL | 0 refills | Status: DC

## 2021-02-11 MED ORDER — CETIRIZINE HCL 10 MG PO TABS: 10.0000 mg | ORAL_TABLET | Freq: Every evening | ORAL | 2 refills | Status: DC | PRN

## 2021-02-11 MED ORDER — AZELASTINE HCL 0.1 % NA SOLN
2.0000 | Freq: Every day | NASAL | 2 refills | Status: DC | PRN
Start: 2021-02-11 — End: 2022-02-26

## 2021-02-11 NOTE — Progress Notes (Signed)
Virtual Visit via Video Note  I connected with Cameron Sanchez on 02/11/21 at 10:00 AM EDT by a video enabled telemedicine application and verified that I am speaking with the correct person using two identifiers.  Location patient: home, [place] Location provider:work or home office Persons participating in the virtual visit: patient, provider  I discussed the limitations of evaluation and management by telemedicine and the availability of in person appointments. The patient expressed understanding and agreed to proceed.   HPI:  1. Since 3/24 or 3/25 sick with chills, pnd, runny nose nasal/sinus congestion no pain tried nyquil w/o relief he has reduced taste not sure if burnt tongue on pinto beans he is not sure about smell but nose is stuff he tried ice and showers help with nasal congestion not tried nose sprays or allergy pills feels in the past it has not happened. He had PND so bad it was hard to swallow and throat felt uncomfortable but this is better and no red/white changes in the back of throat  He is not wearing a mask in public   Denies fever, sob, wheezing, allergies  -COVID-19 vaccine status: 2/2  ROS: See pertinent positives and negatives per HPI.  Past Medical History:  Diagnosis Date  . Allergy   . Chronic back pain   . Hypercholesterolemia   . Hypertension   . Migraines     Past Surgical History:  Procedure Laterality Date  . BACK SURGERY  08/12/14   multiple  . CHOLECYSTECTOMY    . KNEE ARTHROSCOPY     Dr HBK     Current Outpatient Medications:  .  allopurinol (ZYLOPRIM) 300 MG tablet, TAKE 1 TABLET BY MOUTH EVERY DAY (Patient taking differently: Take 300 mg by mouth daily.), Disp: 90 tablet, Rfl: 0 .  atorvastatin (LIPITOR) 40 MG tablet, TAKE 1 TABLET BY MOUTH EVERY DAY (Patient taking differently: Take 40 mg by mouth daily.), Disp: 30 tablet, Rfl: 2 .  azelastine (ASTELIN) 0.1 % nasal spray, Place 2 sprays into both nostrils daily as needed for rhinitis. Use  in each nostril as directed after nasal saline and flonase, Disp: 30 mL, Rfl: 2 .  azithromycin (ZITHROMAX) 250 MG tablet, 2 pills day 1 and 1 pill day 2-5 with food, Disp: 6 tablet, Rfl: 0 .  baclofen (LIORESAL) 10 MG tablet, baclofen 10 mg tablet  TAKE 1 TABLET BY MOUTH 3 TIMES A DAY, Disp: , Rfl:  .  cetirizine (ZYRTEC) 10 MG tablet, Take 1 tablet (10 mg total) by mouth at bedtime as needed for allergies., Disp: 30 tablet, Rfl: 2 .  fluticasone (FLONASE) 50 MCG/ACT nasal spray, Place 2 sprays into both nostrils daily as needed for allergies or rhinitis. After nasal saline, Disp: 16 g, Rfl: 2 .  gabapentin (NEURONTIN) 100 MG capsule, Take 1 capsule (100 mg total) by mouth 3 (three) times daily as needed., Disp: 30 capsule, Rfl: 0 .  metoprolol succinate (TOPROL-XL) 50 MG 24 hr tablet, TAKE 2 TABLETS BY MOUTH DAILY WITH OR IMMEDIATELY FOLLOWING A MEAL., Disp: 180 tablet, Rfl: 0 .  Oxycodone HCl 10 MG TABS, Take 10 mg by mouth every 6 (six) hours as needed (for pain)., Disp: , Rfl:  .  pantoprazole (PROTONIX) 40 MG tablet, TAKE 1 TABLET (40 MG TOTAL) BY MOUTH 2 (TWO) TIMES DAILY BEFORE A MEAL., Disp: 180 tablet, Rfl: 1 .  sodium chloride (OCEAN) 0.65 % SOLN nasal spray, Place 2 sprays into both nostrils daily as needed for congestion., Disp: 30 mL,  Rfl: 2 .  triamterene-hydrochlorothiazide (MAXZIDE-25) 37.5-25 MG tablet, Take 1 tablet by mouth daily., Disp: 90 tablet, Rfl: 0  EXAM:  VITALS per patient if applicable:  GENERAL: alert, oriented, appears well and in no acute distress  HEENT: atraumatic, conjunttiva clear, no obvious abnormalities on inspection of external nose and ears  NECK: normal movements of the head and neck  LUNGS: on inspection no signs of respiratory distress, breathing rate appears normal, no obvious gross SOB, gasping or wheezing  CV: no obvious cyanosis  MS: moves all visible extremities without noticeable abnormality  PSYCH/NEURO: pleasant and cooperative, no  obvious depression or anxiety, speech and thought processing grossly intact  ASSESSMENT AND PLAN:  Discussed the following assessment and plan:  Sinus congestion - Plan: azithromycin (ZITHROMAX) 250 MG tablet, sodium chloride (OCEAN) 0.65 % SOLN nasal spray, fluticasone (FLONASE) 50 MCG/ACT nasal spray, azelastine (ASTELIN) 0.1 % nasal spray  ? Allergic rhinitis, unspecified seasonality, unspecified trigger - Plan: sodium chloride (OCEAN) 0.65 % SOLN nasal spray, fluticasone (FLONASE) 50 MCG/ACT nasal spray, azelastine (ASTELIN) 0.1 % nasal spray, cetirizine (ZYRTEC) 10 MG tablet  PND (post-nasal drip) - Plan: cetirizine (ZYRTEC) 10 MG tablet qhs prn  Exposure to COVID-19 virus - Plan: Novel Coronavirus, NAA ([facility])  Consider 3rd covid 19 booster if covid test negative today in the future   -we discussed possible serious and likely etiologies, options for evaluation and workup, limitations of telemedicine visit vs in person visit, treatment, treatment risks and precautions.   I discussed the assessment and treatment plan with the patient. The patient was provided an opportunity to ask questions and all were answered. The patient agreed with the plan and demonstrated an understanding of the instructions.    Time spent 20 min Delorise Jackson, MD

## 2021-02-11 NOTE — Progress Notes (Signed)
Symptoms ongoing since Friday of last week. No one around the Patient is sick.   When blowing nose it was yellow. Also having drainage in the back of the throat.

## 2021-02-12 LAB — SARS-COV-2, NAA 2 DAY TAT

## 2021-02-12 LAB — NOVEL CORONAVIRUS, NAA: SARS-CoV-2, NAA: NOT DETECTED

## 2021-02-18 ENCOUNTER — Ambulatory Visit (INDEPENDENT_AMBULATORY_CARE_PROVIDER_SITE_OTHER): Payer: Medicare Other

## 2021-02-18 ENCOUNTER — Other Ambulatory Visit: Payer: Self-pay

## 2021-02-18 VITALS — Ht 67.0 in | Wt 221.0 lb

## 2021-02-18 DIAGNOSIS — Z0000 Encounter for general adult medical examination without abnormal findings: Secondary | ICD-10-CM | POA: Diagnosis not present

## 2021-02-18 DIAGNOSIS — M961 Postlaminectomy syndrome, not elsewhere classified: Secondary | ICD-10-CM | POA: Diagnosis not present

## 2021-02-18 DIAGNOSIS — Z1211 Encounter for screening for malignant neoplasm of colon: Secondary | ICD-10-CM | POA: Diagnosis not present

## 2021-02-18 NOTE — Patient Instructions (Addendum)
Cameron Sanchez , Thank you for taking time to come for your Medicare Wellness Visit. I appreciate your ongoing commitment to your health goals. Please review the following plan we discussed and let me know if I can assist you in the future.   These are the goals we discussed: Goals    . DIET - low carb diet       This is a list of the screening recommended for you and due dates:  Health Maintenance  Topic Date Due  . Colon Cancer Screening  Never done  . COVID-19 Vaccine (3 - Pfizer risk 4-dose series) 02/27/2021*  . Complete foot exam   04/17/2021*  . Eye exam for diabetics  04/17/2021*  . Pneumococcal vaccine  04/17/2021*  . Hemoglobin A1C  05/30/2021  . Flu Shot  06/15/2021  . Urine Protein Check  01/13/2022  . Tetanus Vaccine  11/29/2030  .  Hepatitis C: One time screening is recommended by Center for Disease Control  (CDC) for  adults born from [birth year] through 1967.   Completed  . HIV Screening  Completed  . HPV Vaccine  Aged Out  *Topic was postponed. The date shown is not the original due date.    Immunizations Immunization History  Administered Date(s) Administered  . PFIZER(Purple Top)SARS-COV-2 Vaccination 07/16/2020, 08/12/2020  . Tdap 11/29/2020   Keep all routine maintenance appointments.   Labs 04/14/21 @ 9:30  Cpe 04/17/21   Advanced directives: not yet completed  Conditions/risks identified: none new  Follow up in one year for your annual wellness visit.  Preventive Care 40-64 Years, Male Preventive care refers to lifestyle choices and visits with your health care provider that can promote health and wellness. What does preventive care include?  A yearly physical exam. This is also called an annual well check.  Dental exams once or twice a year.  Routine eye exams. Ask your health care provider how often you should have your eyes checked.  Personal lifestyle choices, including:  Daily care of your teeth and gums.  Regular physical activity.  Eating a  healthy diet.  Avoiding tobacco and drug use.  Limiting alcohol use.  Practicing safe sex.  Taking low-dose aspirin every day starting at age 73. What happens during an annual well check? The services and screenings done by your health care provider during your annual well check will depend on your age, overall health, lifestyle risk factors, and family history of disease. Counseling  Your health care provider may ask you questions about your:  Alcohol use.  Tobacco use.  Drug use.  Emotional well-being.  Home and relationship well-being.  Sexual activity.  Eating habits.  Work and work Statistician. Screening  You may have the following tests or measurements:  Height, weight, and BMI.  Blood pressure.  Lipid and cholesterol levels. These may be checked every 5 years, or more frequently if you are over 66 years old.  Skin check.  Lung cancer screening. You may have this screening every year starting at age 75 if you have a 30-pack-year history of smoking and currently smoke or have quit within the past 15 years.  Fecal occult blood test (FOBT) of the stool. You may have this test every year starting at age 74.  Flexible sigmoidoscopy or colonoscopy. You may have a sigmoidoscopy every 5 years or a colonoscopy every 10 years starting at age 43.  Prostate cancer screening. Recommendations will vary depending on your family history and other risks.  Hepatitis C blood test.  Hepatitis B blood test.  Sexually transmitted disease (STD) testing.  Diabetes screening. This is done by checking your blood sugar (glucose) after you have not eaten for a while (fasting). You may have this done every 1-3 years. Discuss your test results, treatment options, and if necessary, the need for more tests with your health care provider. Vaccines  Your health care provider may recommend certain vaccines, such as:  Influenza vaccine. This is recommended every year.  Tetanus,  diphtheria, and acellular pertussis (Tdap, Td) vaccine. You may need a Td booster every 10 years.  Zoster vaccine. You may need this after age 65.  Pneumococcal 13-valent conjugate (PCV13) vaccine. You may need this if you have certain conditions and have not been vaccinated.  Pneumococcal polysaccharide (PPSV23) vaccine. You may need one or two doses if you smoke cigarettes or if you have certain conditions. Talk to your health care provider about which screenings and vaccines you need and how often you need them. This information is not intended to replace advice given to you by your health care provider. Make sure you discuss any questions you have with your health care provider. Document Released: 11/28/2015 Document Revised: 07/21/2016 Document Reviewed: 09/02/2015 Elsevier Interactive Patient Education  2017 [place] Prevention in the Home Falls can cause injuries. They can happen to people of all ages. There are many things you can do to make your home safe and to help prevent falls. What can I do on the outside of my home?  Regularly fix the edges of walkways and driveways and fix any cracks.  Remove anything that might make you trip as you walk through a door, such as a raised step or threshold.  Trim any bushes or trees on the path to your home.  Use bright outdoor lighting.  Clear any walking paths of anything that might make someone trip, such as rocks or tools.  Regularly check to see if handrails are loose or broken. Make sure that both sides of any steps have handrails.  Any raised decks and porches should have guardrails on the edges.  Have any leaves, snow, or ice cleared regularly.  Use sand or salt on walking paths during winter.  Clean up any spills in your garage right away. This includes oil or grease spills. What can I do in the bathroom?  Use night lights.  Install grab bars by the toilet and in the tub and shower. Do not use towel bars as  grab bars.  Use non-skid mats or decals in the tub or shower.  If you need to sit down in the shower, use a plastic, non-slip stool.  Keep the floor dry. Clean up any water that spills on the floor as soon as it happens.  Remove soap buildup in the tub or shower regularly.  Attach bath mats securely with double-sided non-slip rug tape.  Do not have throw rugs and other things on the floor that can make you trip. What can I do in the bedroom?  Use night lights.  Make sure that you have a light by your bed that is easy to reach.  Do not use any sheets or blankets that are too big for your bed. They should not hang down onto the floor.  Have a firm chair that has side arms. You can use this for support while you get dressed.  Do not have throw rugs and other things on the floor that can make you trip. What can I do  in the kitchen?  Clean up any spills right away.  Avoid walking on wet floors.  Keep items that you use a lot in easy-to-reach places.  If you need to reach something above you, use a strong step stool that has a grab bar.  Keep electrical cords out of the way.  Do not use floor polish or wax that makes floors slippery. If you must use wax, use non-skid floor wax.  Do not have throw rugs and other things on the floor that can make you trip. What can I do with my stairs?  Do not leave any items on the stairs.  Make sure that there are handrails on both sides of the stairs and use them. Fix handrails that are broken or loose. Make sure that handrails are as long as the stairways.  Check any carpeting to make sure that it is firmly attached to the stairs. Fix any carpet that is loose or worn.  Avoid having throw rugs at the top or bottom of the stairs. If you do have throw rugs, attach them to the floor with carpet tape.  Make sure that you have a light switch at the top of the stairs and the bottom of the stairs. If you do not have them, ask someone to add them  for you. What else can I do to help prevent falls?  Wear shoes that:  Do not have high heels.  Have rubber bottoms.  Are comfortable and fit you well.  Are closed at the toe. Do not wear sandals.  If you use a stepladder:  Make sure that it is fully opened. Do not climb a closed stepladder.  Make sure that both sides of the stepladder are locked into place.  Ask someone to hold it for you, if possible.  Clearly mark and make sure that you can see:  Any grab bars or handrails.  First and last steps.  Where the edge of each step is.  Use tools that help you move around (mobility aids) if they are needed. These include:  Canes.  Walkers.  Scooters.  Crutches.  Turn on the lights when you go into a dark area. Replace any light bulbs as soon as they burn out.  Set up your furniture so you have a clear path. Avoid moving your furniture around.  If any of your floors are uneven, fix them.  If there are any pets around you, be aware of where they are.  Review your medicines with your doctor. Some medicines can make you feel dizzy. This can increase your chance of falling. Ask your doctor what other things that you can do to help prevent falls. This information is not intended to replace advice given to you by your health care provider. Make sure you discuss any questions you have with your health care provider. Document Released: 08/28/2009 Document Revised: 04/08/2016 Document Reviewed: 12/06/2014 Elsevier Interactive Patient Education  2017 [place].  Colonoscopy, Adult A colonoscopy is a procedure to look at the entire large intestine. This procedure is done using a long, thin, flexible tube that has a camera on the end. You may have a colonoscopy:  As a part of normal colorectal screening.  If you have certain symptoms, such as: ? A low number of red blood cells in your blood (anemia). ? Diarrhea that does not go away. ? Pain in your abdomen. ? Blood in  your stool. A colonoscopy can help screen for and diagnose medical problems, including:  Tumors.  Extra tissue that grows where mucus forms (polyps).  Inflammation.  Areas of bleeding. Tell your health care provider about:  Any allergies you have.  All medicines you are taking, including vitamins, herbs, eye drops, creams, and over-the-counter medicines.  Any problems you or family members have had with anesthetic medicines.  Any blood disorders you have.  Any surgeries you have had.  Any medical conditions you have.  Any problems you have had with having bowel movements.  Whether you are pregnant or may be pregnant. What are the risks? Generally, this is a safe procedure. However, problems may occur, including:  Bleeding.  Damage to your intestine.  Allergic reactions to medicines given during the procedure.  Infection. This is rare. What happens before the procedure? Eating and drinking restrictions Follow instructions from your health care provider about eating or drinking restrictions, which may include:  A few days before the procedure: ? Follow a low-fiber diet. ? Avoid nuts, seeds, dried fruit, raw fruits, and vegetables.  1-3 days before the procedure: ? Eat only gelatin dessert or ice pops. ? Drink only clear liquids, such as water, clear juice, clear broth or bouillon, black coffee or tea, or clear soft drinks or sports drinks. ? Avoid liquids that contain red or purple dye.  The day of the procedure: ? Do not eat solid foods. You may continue to drink clear liquids until up to 2 hours before the procedure. ? Do not eat or drink anything starting 2 hours before the procedure, or within the time period that your health care provider recommends. Bowel prep If you were prescribed a bowel prep to take by mouth (orally) to clean out your colon:  Take it as told by your health care provider. Starting the day before your procedure, you will need to drink a  large amount of liquid medicine. The liquid will cause you to have many bowel movements of loose stool until your stool becomes almost clear or light green.  If your skin or the opening between the buttocks (anus) gets irritated from diarrhea, you may relieve the irritation using: ? Wipes with medicine in them, such as adult wet wipes with aloe and vitamin E. ? A product to soothe skin, such as petroleum jelly.  If you vomit while drinking the bowel prep: ? Take a break for up to 60 minutes. ? Begin the bowel prep again. ? Call your health care provider if you keep vomiting or you cannot take the bowel prep without vomiting.  To clean out your colon, you may also be given: ? Laxative medicines. These help you have a bowel movement. ? Instructions for enema use. An enema is liquid medicine injected into your rectum. Medicines Ask your health care provider about:  Changing or stopping your regular medicines or supplements. This is especially important if you are taking iron supplements, diabetes medicines, or blood thinners.  Taking medicines such as aspirin and ibuprofen. These medicines can thin your blood. Do not take these medicines unless your health care provider tells you to take them.  Taking over-the-counter medicines, vitamins, herbs, and supplements. General instructions  Ask your health care provider what steps will be taken to help prevent infection. These may include washing skin with a germ-killing soap.  Plan to have someone take you home from the hospital or clinic. What happens during the procedure?  An IV will be inserted into one of your veins.  You may be given one or more of the following: ?  A medicine to help you relax (sedative). ? A medicine to numb the area (local anesthetic). ? A medicine to make you fall asleep (general anesthetic). This is rarely needed.  You will lie on your side with your knees bent.  The tube will: ? Have oil or gel put on it (be  lubricated). ? Be inserted into your anus. ? Be gently eased through all parts of your large intestine.  Air will be sent into your colon to keep it open. This may cause some pressure or cramping.  Images will be taken with the camera and will appear on a screen.  A small tissue sample may be removed to be looked at under a microscope (biopsy). The tissue may be sent to a lab for testing if any signs of problems are found.  If small polyps are found, they may be removed and checked for cancer cells.  When the procedure is finished, the tube will be removed. The procedure may vary among health care providers and hospitals.   What happens after the procedure?  Your blood pressure, heart rate, breathing rate, and blood oxygen level will be monitored until you leave the hospital or clinic.  You may have a small amount of blood in your stool.  You may pass gas and have mild cramping or bloating in your abdomen. This is caused by the air that was used to open your colon during the exam.  Do not drive for 24 hours after the procedure.  It is up to you to get the results of your procedure. Ask your health care provider, or the department that is doing the procedure, when your results will be ready. Summary  A colonoscopy is a procedure to look at the entire large intestine.  Follow instructions from your health care provider about eating and drinking before the procedure.  If you were prescribed an oral bowel prep to clean out your colon, take it as told by your health care provider.  During the colonoscopy, a flexible tube with a camera on its end is inserted into the anus and then passed into the other parts of the large intestine. This information is not intended to replace advice given to you by your health care provider. Make sure you discuss any questions you have with your health care provider. Document Revised: 05/25/2019 Document Reviewed: 05/25/2019 Elsevier Patient Education   [place].

## 2021-02-18 NOTE — Progress Notes (Addendum)
Subjective:   Cameron Sanchez is a 50 y.o. male who presents for an Initial Medicare Annual Wellness Visit.  Review of Systems    No ROS.  Medicare Wellness Virtual Visit.   Cardiac Risk Factors include: advanced age (>2men, >51 women);male gender;hypertension     Objective:    Today's Vitals   02/18/21 1403  Weight: 221 lb (100.2 kg)  Height: 5\' 7"  (1.702 m)   Body mass index is 34.61 kg/m.  Advanced Directives 02/18/2021 01/21/2021 11/30/2020 06/17/2018 01/30/2018 07/01/2017  Does Patient Have a Medical Advance Directive? No No No No No No  Would patient like information on creating a medical advance directive? No - Patient declined - No - Patient declined - - No - Patient declined    Current Medications (verified) Outpatient Encounter Medications as of 02/18/2021  Medication Sig   allopurinol (ZYLOPRIM) 300 MG tablet TAKE 1 TABLET BY MOUTH EVERY DAY (Patient taking differently: Take 300 mg by mouth daily.)   atorvastatin (LIPITOR) 40 MG tablet TAKE 1 TABLET BY MOUTH EVERY DAY (Patient taking differently: Take 40 mg by mouth daily.)   azelastine (ASTELIN) 0.1 % nasal spray Place 2 sprays into both nostrils daily as needed for rhinitis. Use in each nostril as directed after nasal saline and flonase   azithromycin (ZITHROMAX) 250 MG tablet 2 pills day 1 and 1 pill day 2-5 with food   baclofen (LIORESAL) 10 MG tablet baclofen 10 mg tablet  TAKE 1 TABLET BY MOUTH 3 TIMES A DAY   cetirizine (ZYRTEC) 10 MG tablet Take 1 tablet (10 mg total) by mouth at bedtime as needed for allergies.   fluticasone (FLONASE) 50 MCG/ACT nasal spray Place 2 sprays into both nostrils daily as needed for allergies or rhinitis. After nasal saline   gabapentin (NEURONTIN) 100 MG capsule Take 1 capsule (100 mg total) by mouth 3 (three) times daily as needed.   metoprolol succinate (TOPROL-XL) 50 MG 24 hr tablet TAKE 2 TABLETS BY MOUTH DAILY WITH OR IMMEDIATELY FOLLOWING A MEAL.   Oxycodone HCl 10 MG TABS Take 10  mg by mouth every 6 (six) hours as needed (for pain).   pantoprazole (PROTONIX) 40 MG tablet TAKE 1 TABLET (40 MG TOTAL) BY MOUTH 2 (TWO) TIMES DAILY BEFORE A MEAL.   sodium chloride (OCEAN) 0.65 % SOLN nasal spray Place 2 sprays into both nostrils daily as needed for congestion.   triamterene-hydrochlorothiazide (MAXZIDE-25) 37.5-25 MG tablet Take 1 tablet by mouth daily.   No facility-administered encounter medications on file as of 02/18/2021.    Allergies (verified) Patient has no known allergies.   History: Past Medical History:  Diagnosis Date   Allergy    Chronic back pain    Hypercholesterolemia    Hypertension    Migraines    Past Surgical History:  Procedure Laterality Date   BACK SURGERY  08/12/14   multiple   CHOLECYSTECTOMY     KNEE ARTHROSCOPY     Dr HBK   Family History  Problem Relation Age of Onset   Cancer Father        germ cell (retroperitoneal)   Coronary artery disease Other        s/p stent (age 24)   Hypertension Paternal Grandfather    Kidney cancer Paternal Grandfather    Diabetes Paternal Grandfather    Diabetes Maternal Grandfather    Heart disease Cousin        MI - age 58   Social History   Socioeconomic History  Marital status: Significant Other    Spouse name: Not on file   Number of children: 2   Years of education: 23   Highest education level: Not on file  Occupational History   Occupation: disabled    Employer: [employer]  Tobacco Use   Smoking status: Never Smoker   Smokeless tobacco: Never Used  Vaping Use   Vaping Use: Never used  Substance and Sexual Activity   Alcohol use: No    Alcohol/week: 0.0 standard drinks   Drug use: No   Sexual activity: Not on file  Other Topics Concern   Not on file  Social History Narrative   Lives with girlfriend in a one story home.  Has 2 sons.  Trying to get disability due to several back issues.  Education: high school.    Social Determinants of Health   Financial  Resource Strain: Low Risk    Difficulty of Paying Living Expenses: Not hard at all  Food Insecurity: No Food Insecurity   Worried About Charity fundraiser in the Last Year: Never true   [place] in the Last Year: Never true  Transportation Needs: No Transportation Needs   Lack of Transportation (Medical): No   Lack of Transportation (Non-Medical): No  Physical Activity: Not on file  Stress: No Stress Concern Present   Feeling of Stress : Not at all  Social Connections: Not on file    Tobacco Counseling Counseling given: Not Answered   Clinical Intake:  Pre-visit preparation completed: Yes        Diabetes: No  How often do you need to have someone help you when you read instructions, pamphlets, or other written materials from your doctor or pharmacy?: 1 - Never   Interpreter Needed?: No      Activities of Daily Living In your present state of health, do you have any difficulty performing the following activities: 02/18/2021 11/30/2020  Hearing? N N  Vision? N N  Difficulty concentrating or making decisions? N N  Walking or climbing stairs? N N  Dressing or bathing? N N  Doing errands, shopping? N N  Preparing Food and eating ? N -  Using the Toilet? N -  In the past six months, have you accidently leaked urine? N -  Do you have problems with loss of bowel control? N -  Managing your Medications? N -  Managing your Finances? N -  Housekeeping or managing your Housekeeping? N -  Some recent data might be hidden    Patient Care Team: Einar Pheasant, MD as PCP - General (Internal Medicine)  Indicate any recent Medical Services you may have received from other than [facility] providers in the past year (date may be approximate).     Assessment:   This is a routine wellness examination for Morley.  I connected with Isaid today by telephone and verified that I am speaking with the correct person using two identifiers. Location patient: home Location  provider: work Persons participating in the virtual visit: patient, Marine scientist.    I discussed the limitations, risks, security and privacy concerns of performing an evaluation and management service by telephone and the availability of in person appointments. The patient expressed understanding and verbally consented to this telephonic visit.    Interactive audio and video telecommunications were attempted between this provider and patient, however failed, due to patient having technical difficulties OR patient did not have access to video capability.  We continued and completed visit with  audio only.  Some vital signs may be absent or patient reported.   Hearing/Vision screen  Hearing Screening   125Hz  250Hz  500Hz  1000Hz  2000Hz  3000Hz  4000Hz  6000Hz  8000Hz   Right ear:           Left ear:           Comments: Patient is able to hear conversational tones without difficulty.  No issues reported.  Vision Screening Comments: Visual acuity not assessed, virtual visit.       Dietary issues and exercise activities discussed: Current Exercise Habits: The patient does not participate in regular exercise at present Active around the house.  Regular diet  Goals      DIET - low carb diet       Depression Screen PHQ 2/9 Scores 02/18/2021 12/12/2020 10/30/2019 12/08/2016 12/12/2013  PHQ - 2 Score 0 0 0 0 0    Fall Risk Fall Risk  02/18/2021 02/11/2021 12/12/2020 10/30/2019 12/08/2016  Falls in the past year? 0 0 0 1 No  Number falls in past yr: 0 0 0 0 -  Injury with Fall? 0 0 0 0 -  Follow up Falls evaluation completed Falls evaluation completed Falls evaluation completed Falls evaluation completed -    FALL RISK PREVENTION PERTAINING TO THE HOME: Handrails in use when climbing stairs? Yes Home free of loose throw rugs in walkways, pet beds, electrical cords, etc? Yes  Adequate lighting in your home to reduce risk of falls? Yes   ASSISTIVE DEVICES UTILIZED TO PREVENT FALLS: Life alert? No  Use  of a cane, walker or w/c? Yes , cane as needed  TIMED UP AND GO: Was the test performed? No . Virtual visit.    Cognitive Function:  Patient is alert and oriented x3.     6CIT Screen 02/18/2021  What Year? 0 points  What month? 0 points  What time? 0 points  Count back from 20 0 points  Months in reverse 0 points  Repeat phrase 0 points  Total Score 0    Immunizations Immunization History  Administered Date(s) Administered   PFIZER(Purple Top)SARS-COV-2 Vaccination 07/16/2020, 08/12/2020   Tdap 11/29/2020   Health Maintenance Health Maintenance  Topic Date Due   COLONOSCOPY (Pts 45-69yrs Insurance coverage will need to be confirmed)  Never done   COVID-19 Vaccine (3 - Pfizer risk 4-dose series) 02/27/2021 (Originally 09/09/2020)   FOOT EXAM  04/17/2021 (Originally 05/08/1979)   OPHTHALMOLOGY EXAM  04/17/2021 (Originally 05/08/1979)   PNEUMOCOCCAL POLYSACCHARIDE VACCINE AGE 46-64 HIGH RISK  04/17/2021 (Originally 05/08/1971)   HEMOGLOBIN A1C  05/30/2021   INFLUENZA VACCINE  06/15/2021   URINE MICROALBUMIN  01/13/2022   TETANUS/TDAP  11/29/2030   Hepatitis C Screening  Completed   HIV Screening  Completed   HPV VACCINES  Aged Out   Colonoscopy- ordered per consent.   Eye exam- plans to schedule. Agrees to update record at upcoming cpe.  Foot exam- followed by pcp. Reports no changes. Denies numbness, tingling, wounds.   Lung Cancer Screening: (Low Dose CT Chest recommended if Age 64-80 years, 30 pack-year currently smoking OR have quit w/in 15years.) does not qualify.   Vision Screening: Recommended annual ophthalmology exams for early detection of glaucoma and other disorders of the eye. Is the patient up to date with their annual eye exam?  Yes   Dental Screening: Recommended annual dental exams for proper oral hygiene.  Community Resource Referral / Chronic Care Management: CRR required this visit?  No   CCM required this visit?  No      Plan:   Keep all  routine maintenance appointments.   Labs 04/14/21 @ 9:30  Cpe 04/17/21    I have personally reviewed and noted the following in the patient's chart:   Medical and social history Use of alcohol, tobacco or illicit drugs  Current medications and supplements Functional ability and status Nutritional status Physical activity Advanced directives List of other physicians Hospitalizations, surgeries, and ER visits in previous 12 months Vitals Screenings to include cognitive, depression, and falls Referrals and appointments  In addition, I have reviewed and discussed with patient certain preventive protocols, quality metrics, and best practice recommendations. A written personalized care plan for preventive services as well as general preventive health recommendations were provided to patient via mychart.     OBrien-Blaney, Davinci Glotfelty L, LPN   04/15/9508    I have reviewed the above information and agree with above.   Deborra Medina, MD

## 2021-02-24 DIAGNOSIS — M25511 Pain in right shoulder: Secondary | ICD-10-CM | POA: Diagnosis not present

## 2021-02-24 DIAGNOSIS — Z79891 Long term (current) use of opiate analgesic: Secondary | ICD-10-CM | POA: Diagnosis not present

## 2021-02-24 DIAGNOSIS — M5416 Radiculopathy, lumbar region: Secondary | ICD-10-CM | POA: Diagnosis not present

## 2021-02-24 DIAGNOSIS — G8929 Other chronic pain: Secondary | ICD-10-CM | POA: Diagnosis not present

## 2021-02-28 ENCOUNTER — Other Ambulatory Visit: Payer: Self-pay | Admitting: Internal Medicine

## 2021-03-05 ENCOUNTER — Other Ambulatory Visit: Payer: Self-pay | Admitting: Internal Medicine

## 2021-03-24 DIAGNOSIS — M961 Postlaminectomy syndrome, not elsewhere classified: Secondary | ICD-10-CM | POA: Diagnosis not present

## 2021-03-24 DIAGNOSIS — Z79891 Long term (current) use of opiate analgesic: Secondary | ICD-10-CM | POA: Diagnosis not present

## 2021-03-24 DIAGNOSIS — G8929 Other chronic pain: Secondary | ICD-10-CM | POA: Diagnosis not present

## 2021-03-24 DIAGNOSIS — M48062 Spinal stenosis, lumbar region with neurogenic claudication: Secondary | ICD-10-CM | POA: Diagnosis not present

## 2021-04-14 ENCOUNTER — Other Ambulatory Visit: Payer: Medicare Other

## 2021-04-17 ENCOUNTER — Encounter: Payer: Medicare Other | Admitting: Internal Medicine

## 2021-04-21 DIAGNOSIS — M7918 Myalgia, other site: Secondary | ICD-10-CM | POA: Diagnosis not present

## 2021-04-21 DIAGNOSIS — Z79899 Other long term (current) drug therapy: Secondary | ICD-10-CM | POA: Diagnosis not present

## 2021-04-21 DIAGNOSIS — G8929 Other chronic pain: Secondary | ICD-10-CM | POA: Diagnosis not present

## 2021-04-21 DIAGNOSIS — M5416 Radiculopathy, lumbar region: Secondary | ICD-10-CM | POA: Diagnosis not present

## 2021-04-21 DIAGNOSIS — Z79891 Long term (current) use of opiate analgesic: Secondary | ICD-10-CM | POA: Diagnosis not present

## 2021-05-06 ENCOUNTER — Ambulatory Visit: Payer: Medicare Other | Admitting: Gastroenterology

## 2021-05-19 DIAGNOSIS — Z79899 Other long term (current) drug therapy: Secondary | ICD-10-CM | POA: Diagnosis not present

## 2021-05-19 DIAGNOSIS — G8929 Other chronic pain: Secondary | ICD-10-CM | POA: Diagnosis not present

## 2021-05-19 DIAGNOSIS — Z79891 Long term (current) use of opiate analgesic: Secondary | ICD-10-CM | POA: Diagnosis not present

## 2021-05-19 DIAGNOSIS — M5116 Intervertebral disc disorders with radiculopathy, lumbar region: Secondary | ICD-10-CM | POA: Diagnosis not present

## 2021-05-19 DIAGNOSIS — E1140 Type 2 diabetes mellitus with diabetic neuropathy, unspecified: Secondary | ICD-10-CM | POA: Diagnosis not present

## 2021-05-25 ENCOUNTER — Other Ambulatory Visit: Payer: Self-pay

## 2021-05-25 ENCOUNTER — Ambulatory Visit (INDEPENDENT_AMBULATORY_CARE_PROVIDER_SITE_OTHER): Payer: Medicare Other | Admitting: Internal Medicine

## 2021-05-25 ENCOUNTER — Ambulatory Visit (INDEPENDENT_AMBULATORY_CARE_PROVIDER_SITE_OTHER): Payer: Medicare Other

## 2021-05-25 ENCOUNTER — Encounter: Payer: Self-pay | Admitting: Internal Medicine

## 2021-05-25 VITALS — BP 102/60 | HR 73 | Temp 96.9°F | Ht 67.01 in | Wt 221.2 lb

## 2021-05-25 DIAGNOSIS — M545 Low back pain, unspecified: Secondary | ICD-10-CM

## 2021-05-25 DIAGNOSIS — K5900 Constipation, unspecified: Secondary | ICD-10-CM

## 2021-05-25 DIAGNOSIS — G4730 Sleep apnea, unspecified: Secondary | ICD-10-CM

## 2021-05-25 DIAGNOSIS — K7460 Unspecified cirrhosis of liver: Secondary | ICD-10-CM | POA: Diagnosis not present

## 2021-05-25 DIAGNOSIS — G8929 Other chronic pain: Secondary | ICD-10-CM

## 2021-05-25 DIAGNOSIS — R8290 Unspecified abnormal findings in urine: Secondary | ICD-10-CM | POA: Insufficient documentation

## 2021-05-25 DIAGNOSIS — D649 Anemia, unspecified: Secondary | ICD-10-CM | POA: Diagnosis not present

## 2021-05-25 DIAGNOSIS — I700 Atherosclerosis of aorta: Secondary | ICD-10-CM

## 2021-05-25 DIAGNOSIS — E7800 Pure hypercholesterolemia, unspecified: Secondary | ICD-10-CM | POA: Diagnosis not present

## 2021-05-25 DIAGNOSIS — M549 Dorsalgia, unspecified: Secondary | ICD-10-CM | POA: Insufficient documentation

## 2021-05-25 DIAGNOSIS — K219 Gastro-esophageal reflux disease without esophagitis: Secondary | ICD-10-CM

## 2021-05-25 DIAGNOSIS — R109 Unspecified abdominal pain: Secondary | ICD-10-CM | POA: Diagnosis not present

## 2021-05-25 DIAGNOSIS — E1149 Type 2 diabetes mellitus with other diabetic neurological complication: Secondary | ICD-10-CM | POA: Diagnosis not present

## 2021-05-25 DIAGNOSIS — I10 Essential (primary) hypertension: Secondary | ICD-10-CM | POA: Diagnosis not present

## 2021-05-25 LAB — CBC WITH DIFFERENTIAL/PLATELET
Basophils Absolute: 0 10*3/uL (ref 0.0–0.1)
Basophils Relative: 0.4 % (ref 0.0–3.0)
Eosinophils Absolute: 0.2 10*3/uL (ref 0.0–0.7)
Eosinophils Relative: 2 % (ref 0.0–5.0)
HCT: 40.5 % (ref 39.0–52.0)
Hemoglobin: 13.8 g/dL (ref 13.0–17.0)
Lymphocytes Relative: 28.6 % (ref 12.0–46.0)
Lymphs Abs: 2.4 10*3/uL (ref 0.7–4.0)
MCHC: 34.1 g/dL (ref 30.0–36.0)
MCV: 87.3 fl (ref 78.0–100.0)
Monocytes Absolute: 0.8 10*3/uL (ref 0.1–1.0)
Monocytes Relative: 9.3 % (ref 3.0–12.0)
Neutro Abs: 5 10*3/uL (ref 1.4–7.7)
Neutrophils Relative %: 59.7 % (ref 43.0–77.0)
Platelets: 136 10*3/uL — ABNORMAL LOW (ref 150.0–400.0)
RBC: 4.64 Mil/uL (ref 4.22–5.81)
RDW: 14.7 % (ref 11.5–15.5)
WBC: 8.4 10*3/uL (ref 4.0–10.5)

## 2021-05-25 LAB — HEPATIC FUNCTION PANEL
ALT: 28 U/L (ref 0–53)
AST: 33 U/L (ref 0–37)
Albumin: 4.5 g/dL (ref 3.5–5.2)
Alkaline Phosphatase: 83 U/L (ref 39–117)
Bilirubin, Direct: 0.1 mg/dL (ref 0.0–0.3)
Total Bilirubin: 0.7 mg/dL (ref 0.2–1.2)
Total Protein: 7.2 g/dL (ref 6.0–8.3)

## 2021-05-25 LAB — URINALYSIS, ROUTINE W REFLEX MICROSCOPIC
Bilirubin Urine: NEGATIVE
Hgb urine dipstick: NEGATIVE
Ketones, ur: NEGATIVE
Leukocytes,Ua: NEGATIVE
Nitrite: NEGATIVE
RBC / HPF: NONE SEEN (ref 0–?)
Specific Gravity, Urine: 1.02 (ref 1.000–1.030)
Total Protein, Urine: NEGATIVE
Urine Glucose: NEGATIVE
Urobilinogen, UA: 0.2 (ref 0.0–1.0)
WBC, UA: NONE SEEN (ref 0–?)
pH: 7 (ref 5.0–8.0)

## 2021-05-25 LAB — BASIC METABOLIC PANEL
BUN: 15 mg/dL (ref 6–23)
CO2: 27 mEq/L (ref 19–32)
Calcium: 9.6 mg/dL (ref 8.4–10.5)
Chloride: 100 mEq/L (ref 96–112)
Creatinine, Ser: 1.15 mg/dL (ref 0.40–1.50)
GFR: 73.41 mL/min (ref >60.00)
Glucose, Bld: 131 mg/dL — ABNORMAL HIGH (ref 70–99)
Potassium: 4 mEq/L (ref 3.5–5.1)
Sodium: 136 mEq/L (ref 135–145)

## 2021-05-25 LAB — IBC + FERRITIN
Ferritin: 42.4 ng/mL (ref 22.0–322.0)
Iron: 70 ug/dL (ref 42–165)
Saturation Ratios: 15.2 % — ABNORMAL LOW (ref 20.0–50.0)
Transferrin: 329 mg/dL (ref 212.0–360.0)

## 2021-05-25 LAB — TSH: TSH: 1.89 u[IU]/mL (ref 0.35–5.50)

## 2021-05-25 LAB — HEMOGLOBIN A1C: Hgb A1c MFr Bld: 7 % — ABNORMAL HIGH (ref 4.6–6.5)

## 2021-05-25 NOTE — Progress Notes (Signed)
[name], male   DOB: 1971-01-02, 50 y.o.   MRN: [id number]   Subjective:    [name], male    DOB: 09-22-1971, 50 y.o.   MRN: [id number]  HPI This visit occurred during the SARS-CoV-2 public health emergency.  Safety protocols were in place, including screening questions prior to the visit, additional usage of staff PPE, and extensive cleaning of exam room while observing appropriate contact time as indicated for disinfecting solutions.   Patient here for a scheduled follow up.  Here to follow up regarding his blood sugar, cholesterol and blood pressure.  He is having increased right mid to lower back pain.  Has noticed urine odor.  Also reports urine darker - more yellow.  States lying on his right side, back/muscle - feels more swollen. Has been trying to drink more water.  Trying to limit sweet tea.  No nausea or vomiting.  Occasional decreased appetite.  Bowels are moving - qod to every two days.  Due to f/u with pain management next month.  Taking oxycodone 43m qid.  Did take some left over amoxicillin - felt helped.  Took partial dose one month ago.  No fever.     Past Medical History:  Diagnosis Date   Allergy    Chronic back pain    Hypercholesterolemia    Hypertension    Migraines    Past Surgical History:  Procedure Laterality Date   BACK SURGERY  08/12/14   multiple   CHOLECYSTECTOMY     KNEE ARTHROSCOPY     Dr HBK   Family History  Problem Relation Age of Onset   Cancer Father        germ cell (retroperitoneal)   Coronary artery disease Other        s/p stent (age 50   Hypertension Paternal Grandfather    Kidney cancer Paternal Grandfather    Diabetes Paternal Grandfather    Diabetes Maternal Grandfather    Heart disease Cousin        MI - age 338  Social History   Socioeconomic History   Marital status: Significant Other    Spouse name: Not on file   Number of children: 2   Years of education: 12   Highest education level:  Not on file  Occupational History   Occupation: disabled    Employer: [employer]  Tobacco Use   Smoking status: Never   Smokeless tobacco: Never  Vaping Use   Vaping Use: Never used  Substance and Sexual Activity   Alcohol use: No    Alcohol/week: 0.0 standard drinks   Drug use: No   Sexual activity: Not on file  Other Topics Concern   Not on file  Social History Narrative   Lives with girlfriend in a one story home.  Has 2 sons.  Trying to get disability due to several back issues.  Education: high school.    Social Determinants of Health   Financial Resource Strain: Low Risk    Difficulty of Paying Living Expenses: Not hard at all  Food Insecurity: No Food Insecurity   Worried About RCharity fundraiserin the Last Year: Never true   [place] Last [place]: Never true  Transportation Needs: No Transportation Needs   Lack of Transportation (Medical): No   Lack of Transportation (Non-Medical): No  Physical Activity: Not on file  Stress: No Stress Concern Present   Feeling of Stress :  Not at all  Social Connections: Not on file    Review of Systems  Constitutional:  Negative for unexpected weight change.       Occasional decreased appetite.   HENT:  Negative for congestion and sinus pressure.   Respiratory:  Negative for cough, chest tightness and shortness of breath.   Cardiovascular:  Negative for chest pain and palpitations.  Gastrointestinal:  Positive for constipation. Negative for diarrhea, nausea and vomiting.  Genitourinary:  Negative for hematuria.       Urinary odor as outlined.  Urine smells.    Musculoskeletal:  Positive for back pain. Negative for joint swelling and myalgias.  Skin:  Negative for color change and rash.  Neurological:  Negative for dizziness and headaches.  Psychiatric/Behavioral:  Negative for agitation and dysphoric mood.       Objective:    Physical Exam Constitutional:      General: He is not in acute  distress.    Appearance: Normal appearance. He is well-developed.  HENT:     Head: Normocephalic and atraumatic.     Right Ear: External ear normal.     Left Ear: External ear normal.  Eyes:     General: No scleral icterus.       Right eye: No discharge.        Left eye: No discharge.  Cardiovascular:     Rate and Rhythm: Normal rate and regular rhythm.  Pulmonary:     Effort: Pulmonary effort is normal. No respiratory distress.     Breath sounds: Normal breath sounds.  Abdominal:     General: Bowel sounds are normal.     Palpations: Abdomen is soft.     Tenderness: There is no abdominal tenderness.  Musculoskeletal:        General: No swelling or tenderness.     Cervical back: Neck supple. No tenderness.     Comments: No CVA tenderness.   Lymphadenopathy:     Cervical: No cervical adenopathy.  Skin:    Findings: No erythema or rash.  Neurological:     Mental Status: He is alert.  Psychiatric:        Mood and Affect: Mood normal.        Behavior: Behavior normal.    BP 102/60 (BP Location: Left Arm, Patient Position: Sitting)   Pulse 73   Temp (!) 96.9 F (36.1 C)   Ht 5' 7.01" (1.702 m)   Wt 221 lb 3.2 oz (100.3 kg)   SpO2 97%   BMI 34.64 kg/m  Wt Readings from Last 3 Encounters:  05/25/21 221 lb 3.2 oz (100.3 kg)  02/18/21 221 lb (100.2 kg)  02/11/21 221 lb (100.2 kg)    Outpatient Encounter Medications as of 05/25/2021  Medication Sig   allopurinol (ZYLOPRIM) 300 MG tablet TAKE 1 TABLET BY MOUTH EVERY DAY   atorvastatin (LIPITOR) 40 MG tablet TAKE 1 TABLET BY MOUTH EVERY DAY (Patient taking differently: Take 40 mg by mouth daily.)   azelastine (ASTELIN) 0.1 % nasal spray Place 2 sprays into both nostrils daily as needed for rhinitis. Use in each nostril as directed after nasal saline and flonase   fluticasone (FLONASE) 50 MCG/ACT nasal spray Place 2 sprays into both nostrils daily as needed for allergies or rhinitis. After nasal saline   gabapentin (NEURONTIN)  100 MG capsule Take 1 capsule (100 mg total) by mouth 3 (three) times daily as needed.   meloxicam (MOBIC) 15 MG tablet Take 15 mg by mouth daily.  metaxalone (SKELAXIN) 800 MG tablet Take by mouth.   metoprolol succinate (TOPROL-XL) 50 MG 24 hr tablet TAKE 2 TABLETS BY MOUTH DAILY WITH OR IMMEDIATELY FOLLOWING A MEAL.   Oxycodone HCl 10 MG TABS Take 10 mg by mouth every 6 (six) hours as needed (for pain).   sodium chloride (OCEAN) 0.65 % SOLN nasal spray Place 2 sprays into both nostrils daily as needed for congestion.   triamterene-hydrochlorothiazide (MAXZIDE-25) 37.5-25 MG tablet Take 1 tablet by mouth daily.   [DISCONTINUED] pantoprazole (PROTONIX) 40 MG tablet TAKE 1 TABLET (40 MG TOTAL) BY MOUTH 2 (TWO) TIMES DAILY BEFORE A MEAL.   [DISCONTINUED] azithromycin (ZITHROMAX) 250 MG tablet 2 pills day 1 and 1 pill day 2-5 with food (Patient not taking: Reported on 05/25/2021)   [DISCONTINUED] baclofen (LIORESAL) 10 MG tablet baclofen 10 mg tablet  TAKE 1 TABLET BY MOUTH 3 TIMES A DAY (Patient not taking: Reported on 05/25/2021)   [DISCONTINUED] cetirizine (ZYRTEC) 10 MG tablet Take 1 tablet (10 mg total) by mouth at bedtime as needed for allergies. (Patient not taking: Reported on 05/25/2021)   No facility-administered encounter medications on file as of 05/25/2021.     Lab Results  Component Value Date   WBC 8.4 05/25/2021   HGB 13.8 05/25/2021   HCT 40.5 05/25/2021   PLT 136.0 (L) 05/25/2021   GLUCOSE 131 (H) 05/25/2021   CHOL 104 01/13/2021   TRIG 129.0 01/13/2021   HDL 40.40 01/13/2021   LDLDIRECT 142.0 06/21/2019   LDLCALC 38 01/13/2021   ALT 28 05/25/2021   AST 33 05/25/2021   NA 136 05/25/2021   K 4.0 05/25/2021   CL 100 05/25/2021   CREATININE 1.15 05/25/2021   BUN 15 05/25/2021   CO2 27 05/25/2021   TSH 1.89 05/25/2021   PSA 0.43 01/04/2018   INR 1.0 11/29/2020   HGBA1C 7.0 (H) 05/25/2021   MICROALBUR <0.7 01/13/2021    DG Chest 2 View  Result Date:  01/21/2021 CLINICAL DATA:  Chest pain and shortness of breath. EXAM: CHEST - 2 VIEW COMPARISON:  Chest CT 11/30/2020.  Chest radiograph January 30, 2018. FINDINGS: The heart size and mediastinal contours are within normal limits. Both lungs are clear. No visible pleural effusions or pneumothorax. Apparent nodular opacity in the right lower lung is favored to represent nipple shadow or confluence of shadows given no pulmonary nodule identified on recent CT chest and given overlap of ribs in this region. Please see recent CT chest for characterization of manubrial and manubrial fractures. IMPRESSION: No active cardiopulmonary disease. Electronically Signed   By: Margaretha Sheffield MD   On: 01/21/2021 10:54   CT Angio Chest PE W and/or Wo Contrast  Result Date: 01/21/2021 CLINICAL DATA:  Chest pain and shortness of breath. Recent rib and sternal fractures. EXAM: CT ANGIOGRAPHY CHEST WITH CONTRAST TECHNIQUE: Multidetector CT imaging of the chest was performed using the standard protocol during bolus administration of intravenous contrast. Multiplanar CT image reconstructions and MIPs were obtained to evaluate the vascular anatomy. CONTRAST:  13m OMNIPAQUE IOHEXOL 350 MG/ML SOLN COMPARISON:  Chest CT November 30, 2020; chest radiograph January 21, 2021 FINDINGS: Cardiovascular: There is no demonstrable pulmonary embolus. There is no appreciable thoracic aortic aneurysm or dissection. Visualized great vessels appear normal. There are occasional foci of coronary artery calcification. There is no pericardial effusion or pericardial thickening. Mediastinum/Nodes: Visualized thyroid appears normal. No thoracic adenopathy evident. No esophageal lesions. Lungs/Pleura: Lungs are clear. No pleural effusion. No pneumothorax. Trachea and major bronchial  structures appear unremarkable. Upper Abdomen: Gallbladder absent. Liver contour is somewhat nodular, stable in appearance. Visualized upper abdominal structures otherwise appear  normal. Musculoskeletal: Previously noted anterior rib fractures on the left show healing response. Fracture of the posterior aspect of the manubrium is again noted with mildly displaced fracture fragments. No appreciable callus seen in this area. No blastic or lytic bone lesions. No new fracture evident. No chest wall lesions. Review of the MIP images confirms the above findings. IMPRESSION: 1. No demonstrable pulmonary embolus. No thoracic aortic aneurysm or dissection. 2. Fracture of the manubrium along its posterior aspect with several displaced fracture fragments in this area. No appreciable healing response in this area compared to the previous study. There are several healed anterior left-sided rib fractures. 3.  No pneumothorax.  Lungs clear. 4.  No adenopathy. 5. Contour of the liver raises concern for a degree of underlying hepatic cirrhosis. Gallbladder absent. Electronically Signed   By: Lowella Grip III M.D.   On: 01/21/2021 12:21       Assessment & Plan:   Problem List Items Addressed This Visit     Anemia    Check cbc today.        Relevant Orders   CBC with Differential/Platelet (Completed)   IBC + Ferritin (Completed)   Aortic atherosclerosis (HCC)    Continue lipitor.        Back pain   Relevant Medications   meloxicam (MOBIC) 15 MG tablet   metaxalone (SKELAXIN) 800 MG tablet   Other Relevant Orders   DG Abd 1 View (Completed)   Bad odor of urine    Check urine to confirm no infection.        Relevant Orders   Urinalysis, Routine w reflex microscopic (Completed)   Urine Culture (Completed)   Chronic back pain    Has chronic back pain.  Taking narcotics.  With pain as outlined.  Will check urine to confirm no infection.  Discussed possible msk origin.  Continue f/u with pain clinic.         Relevant Medications   meloxicam (MOBIC) 15 MG tablet   metaxalone (SKELAXIN) 800 MG tablet   Cirrhosis of liver ([place])    Noted on recent CT.  Has appt with GI  06/2021.  Follow liver function tests.         Constipation    Takes narcotic pain medication.  Current regimen - trying to keep bowels moving.  Follow.  Planning to f/u with GI 06/2021.  Needs colonoscopy.        GERD (gastroesophageal reflux disease)    Continue protonix.  No upper symptoms reported.        Hypercholesterolemia    On lipitor.  Low cholesterol diet and exercise.  Follow lipid panel and liver function tests.         Relevant Orders   Hepatic function panel (Completed)   TSH (Completed)   Hypertension - Primary    Blood pressure doing well.  Continue triam/hctz.  Follow pressures.  Follow metabolic panel.        Sleep apnea    Have discussed the need for treatment of sleep apnea.         Type 2 diabetes mellitus with neurological complications (HCC)    Apparently is taking metformin (taking his father's).  Discussed low carb diet and exercise.  Check met b and a1c.  rx sent in for metformin - to increase to bid.  Relevant Orders   Hemoglobin A1c (Completed)   Basic metabolic panel (Completed)     Einar Pheasant, MD

## 2021-05-26 ENCOUNTER — Other Ambulatory Visit: Payer: Self-pay | Admitting: Internal Medicine

## 2021-05-26 ENCOUNTER — Telehealth: Payer: Self-pay | Admitting: Internal Medicine

## 2021-05-26 DIAGNOSIS — D696 Thrombocytopenia, unspecified: Secondary | ICD-10-CM

## 2021-05-26 DIAGNOSIS — I10 Essential (primary) hypertension: Secondary | ICD-10-CM

## 2021-05-26 LAB — URINE CULTURE
MICRO NUMBER:: 12103473
Result:: NO GROWTH
SPECIMEN QUALITY:: ADEQUATE

## 2021-05-26 NOTE — Progress Notes (Signed)
Order placed for f/u labs.  

## 2021-05-26 NOTE — Telephone Encounter (Signed)
The patient returned your call regarding lab results.  

## 2021-05-28 ENCOUNTER — Other Ambulatory Visit: Payer: Self-pay | Admitting: Internal Medicine

## 2021-05-28 MED ORDER — METFORMIN HCL 500 MG PO TABS: 500.0000 mg | ORAL_TABLET | Freq: Two times a day (BID) | ORAL | 1 refills | Status: DC

## 2021-05-28 NOTE — Progress Notes (Signed)
Rx sent in for metformin 500mg  bid #180 with one refill.

## 2021-05-28 NOTE — Telephone Encounter (Signed)
See Lab result.

## 2021-05-29 ENCOUNTER — Other Ambulatory Visit: Payer: Self-pay | Admitting: Internal Medicine

## 2021-06-01 ENCOUNTER — Encounter: Payer: Self-pay | Admitting: Internal Medicine

## 2021-06-01 NOTE — Assessment & Plan Note (Signed)
Noted on recent CT.  Has appt with GI 06/2021.  Follow liver function tests.

## 2021-06-01 NOTE — Assessment & Plan Note (Signed)
Have discussed the need for treatment of sleep apnea.

## 2021-06-01 NOTE — Assessment & Plan Note (Signed)
Has chronic back pain.  Taking narcotics.  With pain as outlined.  Will check urine to confirm no infection.  Discussed possible msk origin.  Continue f/u with pain clinic.

## 2021-06-01 NOTE — Assessment & Plan Note (Signed)
Takes narcotic pain medication.  Current regimen - trying to keep bowels moving.  Follow.  Planning to f/u with GI 06/2021.  Needs colonoscopy.

## 2021-06-01 NOTE — Assessment & Plan Note (Signed)
On lipitor.  Low cholesterol diet and exercise.  Follow lipid panel and liver function tests.   

## 2021-06-01 NOTE — Assessment & Plan Note (Signed)
Apparently is taking metformin (taking his father's).  Discussed low carb diet and exercise.  Check met b and a1c.  rx sent in for metformin - to increase to bid.

## 2021-06-01 NOTE — Assessment & Plan Note (Signed)
Continue protonix.  No upper symptoms reported.  

## 2021-06-01 NOTE — Assessment & Plan Note (Signed)
Blood pressure doing well.  Continue triam/hctz.  Follow pressures.  Follow metabolic panel.  

## 2021-06-01 NOTE — Assessment & Plan Note (Signed)
Check urine to confirm no infection.  

## 2021-06-01 NOTE — Assessment & Plan Note (Signed)
Continue lipitor  ?

## 2021-06-01 NOTE — Assessment & Plan Note (Signed)
Check cbc today 

## 2021-06-03 ENCOUNTER — Other Ambulatory Visit: Payer: Self-pay | Admitting: Internal Medicine

## 2021-06-03 NOTE — Telephone Encounter (Signed)
Pt called and states that [facility] has been trying to reach [place] to refill allopurinol (ZYLOPRIM) 300 MG tablet. Please send asap

## 2021-06-07 ENCOUNTER — Other Ambulatory Visit: Payer: Self-pay | Admitting: Internal Medicine

## 2021-06-16 DIAGNOSIS — G8929 Other chronic pain: Secondary | ICD-10-CM | POA: Diagnosis not present

## 2021-06-16 DIAGNOSIS — M5416 Radiculopathy, lumbar region: Secondary | ICD-10-CM | POA: Diagnosis not present

## 2021-06-16 DIAGNOSIS — Z79899 Other long term (current) drug therapy: Secondary | ICD-10-CM | POA: Diagnosis not present

## 2021-06-16 DIAGNOSIS — M961 Postlaminectomy syndrome, not elsewhere classified: Secondary | ICD-10-CM | POA: Diagnosis not present

## 2021-06-23 DIAGNOSIS — M5116 Intervertebral disc disorders with radiculopathy, lumbar region: Secondary | ICD-10-CM | POA: Diagnosis not present

## 2021-06-23 DIAGNOSIS — Z9119 Patient's noncompliance with other medical treatment and regimen: Secondary | ICD-10-CM | POA: Diagnosis not present

## 2021-06-23 DIAGNOSIS — Z79891 Long term (current) use of opiate analgesic: Secondary | ICD-10-CM | POA: Diagnosis not present

## 2021-06-23 DIAGNOSIS — G894 Chronic pain syndrome: Secondary | ICD-10-CM | POA: Diagnosis not present

## 2021-06-23 DIAGNOSIS — Z79899 Other long term (current) drug therapy: Secondary | ICD-10-CM | POA: Diagnosis not present

## 2021-07-08 ENCOUNTER — Other Ambulatory Visit (INDEPENDENT_AMBULATORY_CARE_PROVIDER_SITE_OTHER): Payer: Medicare Other

## 2021-07-08 ENCOUNTER — Other Ambulatory Visit: Payer: Self-pay

## 2021-07-08 DIAGNOSIS — I10 Essential (primary) hypertension: Secondary | ICD-10-CM | POA: Diagnosis not present

## 2021-07-08 DIAGNOSIS — D696 Thrombocytopenia, unspecified: Secondary | ICD-10-CM | POA: Diagnosis not present

## 2021-07-08 LAB — BASIC METABOLIC PANEL
BUN: 14 mg/dL (ref 6–23)
CO2: 24 mEq/L (ref 19–32)
Calcium: 9.8 mg/dL (ref 8.4–10.5)
Chloride: 101 mEq/L (ref 96–112)
Creatinine, Ser: 0.89 mg/dL (ref 0.40–1.50)
GFR: 98.76 mL/min (ref >60.00)
Glucose, Bld: 149 mg/dL — ABNORMAL HIGH (ref 70–99)
Potassium: 4.1 mEq/L (ref 3.5–5.1)
Sodium: 136 mEq/L (ref 135–145)

## 2021-07-08 LAB — PLATELET COUNT: Platelets: 143 10*3/uL (ref 140–400)

## 2021-07-14 ENCOUNTER — Encounter: Payer: Self-pay | Admitting: *Deleted

## 2021-07-14 ENCOUNTER — Ambulatory Visit: Payer: Medicare Other | Admitting: Gastroenterology

## 2021-07-30 DIAGNOSIS — M961 Postlaminectomy syndrome, not elsewhere classified: Secondary | ICD-10-CM | POA: Diagnosis not present

## 2021-07-30 DIAGNOSIS — Z79891 Long term (current) use of opiate analgesic: Secondary | ICD-10-CM | POA: Diagnosis not present

## 2021-07-30 DIAGNOSIS — M545 Low back pain, unspecified: Secondary | ICD-10-CM | POA: Diagnosis not present

## 2021-07-30 DIAGNOSIS — G894 Chronic pain syndrome: Secondary | ICD-10-CM | POA: Diagnosis not present

## 2021-08-06 ENCOUNTER — Other Ambulatory Visit: Payer: Self-pay | Admitting: Internal Medicine

## 2021-08-13 DIAGNOSIS — Z79891 Long term (current) use of opiate analgesic: Secondary | ICD-10-CM | POA: Diagnosis not present

## 2021-08-13 DIAGNOSIS — G894 Chronic pain syndrome: Secondary | ICD-10-CM | POA: Diagnosis not present

## 2021-08-13 DIAGNOSIS — M545 Low back pain, unspecified: Secondary | ICD-10-CM | POA: Diagnosis not present

## 2021-08-13 DIAGNOSIS — M961 Postlaminectomy syndrome, not elsewhere classified: Secondary | ICD-10-CM | POA: Diagnosis not present

## 2021-08-19 ENCOUNTER — Telehealth: Payer: Self-pay | Admitting: Internal Medicine

## 2021-08-19 ENCOUNTER — Other Ambulatory Visit: Payer: Self-pay

## 2021-08-19 ENCOUNTER — Encounter: Payer: Self-pay | Admitting: Nurse Practitioner

## 2021-08-19 ENCOUNTER — Telehealth (INDEPENDENT_AMBULATORY_CARE_PROVIDER_SITE_OTHER): Payer: Medicare Other | Admitting: Nurse Practitioner

## 2021-08-19 VITALS — Temp 100.0°F

## 2021-08-19 DIAGNOSIS — U071 COVID-19: Secondary | ICD-10-CM | POA: Insufficient documentation

## 2021-08-19 MED ORDER — MOLNUPIRAVIR EUA 200MG CAPSULE
4.0000 | ORAL_CAPSULE | Freq: Two times a day (BID) | ORAL | 0 refills | Status: AC
Start: 2021-08-19 — End: 2021-08-24

## 2021-08-19 NOTE — Progress Notes (Signed)
[name], male    DOB: 06-24-1971, 50 y.o.   MRN: [id number]  Virtual visit completed through [place], a video enabled telemedicine application. Due to national recommendations of social distancing due to COVID-19, a virtual visit is felt to be most appropriate for this patient at this time. Reviewed limitations, risks, security and privacy concerns of performing a virtual visit and the availability of in person appointments. I also reviewed that there may be a patient responsible charge related to this service. The patient agreed to proceed.   Patient location: home Provider location: [place] at [facility], office Persons participating in this virtual visit: patient, provider   If any vitals were documented, they were collected by patient at home unless specified below.    Temp 100 F (37.8 C) Comment: per patient   CC: Covid 19 Infection  Subjective:   HPI: Cameron Sanchez is a 50 y.o. male presenting on 08/19/2021 for Covid Positive (On 08/19/21. Sx started on 08/18/21-stuffy head and nose, chills, fever, post nasal drip. No cough, no sore throat)   Symptoms 08/18/2021 Test postiviet at home for 08/19/2021 Vaccined against covid moderna x 2 Has taken nyquill severe cold and flu. Helped some     Relevant past medical, surgical, family and social history reviewed and updated as indicated. Interim medical history since our last visit reviewed. Allergies and medications reviewed and updated. Outpatient Medications Prior to Visit  Medication Sig Dispense Refill   allopurinol (ZYLOPRIM) 300 MG tablet TAKE 1 TABLET BY MOUTH EVERY DAY 90 tablet 0   atorvastatin (LIPITOR) 40 MG tablet TAKE 1 TABLET BY MOUTH EVERY DAY (Patient taking differently: Take 40 mg by mouth daily.) 30 tablet 2   azelastine (ASTELIN) 0.1 % nasal spray Place 2 sprays into both nostrils daily as needed for rhinitis. Use in each nostril as directed after nasal saline and flonase 30 mL 2    fluticasone (FLONASE) 50 MCG/ACT nasal spray Place 2 sprays into both nostrils daily as needed for allergies or rhinitis. After nasal saline 16 g 2   gabapentin (NEURONTIN) 400 MG capsule Take 400 mg by mouth 2 (two) times daily.     metaxalone (SKELAXIN) 800 MG tablet Take by mouth.     metFORMIN (GLUCOPHAGE) 500 MG tablet Take 1 tablet (500 mg total) by mouth 2 (two) times daily with a meal. 180 tablet 1   metoprolol succinate (TOPROL-XL) 50 MG 24 hr tablet TAKE 2 TABLETS BY MOUTH DAILY WITH OR IMMEDIATELY FOLLOWING A MEAL. 180 tablet 0   Oxycodone HCl 10 MG TABS Take 10 mg by mouth every 6 (six) hours as needed (for pain).     pantoprazole (PROTONIX) 40 MG tablet TAKE 1 TABLET (40 MG TOTAL) BY MOUTH 2 (TWO) TIMES DAILY BEFORE A MEAL. 180 tablet 1   sodium chloride (OCEAN) 0.65 % SOLN nasal spray Place 2 sprays into both nostrils daily as needed for congestion. 30 mL 2   triamterene-hydrochlorothiazide (MAXZIDE-25) 37.5-25 MG tablet TAKE 1 TABLET BY MOUTH EVERY DAY 90 tablet 0   gabapentin (NEURONTIN) 100 MG capsule Take 1 capsule (100 mg total) by mouth 3 (three) times daily as needed. 30 capsule 0   meloxicam (MOBIC) 15 MG tablet Take 15 mg by mouth daily.     No facility-administered medications prior to visit.     Per HPI unless specifically indicated in ROS section below Review of Systems  Constitutional:  Positive for chills, fatigue and fever.  HENT:  Positive  for congestion and postnasal drip. Negative for sore throat.   Respiratory:  Positive for shortness of breath (baseline). Negative for cough.   Cardiovascular:  Negative for chest pain.  Gastrointestinal:  Negative for abdominal pain, diarrhea, nausea and vomiting.  Neurological:  Positive for headaches.  Objective:  Temp 100 F (37.8 C) Comment: per patient  Wt Readings from Last 3 Encounters:  05/25/21 221 lb 3.2 oz (100.3 kg)  02/18/21 221 lb (100.2 kg)  02/11/21 221 lb (100.2 kg)      Physical exam: Gen: alert,  NAD, not ill appearing Pulm: speaks in complete sentences without increased work of breathing Psych: normal mood, normal thought content      Results for orders placed or performed in visit on 07/08/21  Platelet count  Result Value Ref Range   Platelets 143 140 - 400 Thousand/uL  Basic metabolic panel  Result Value Ref Range   Sodium 136 135 - 145 mEq/L   Potassium 4.1 3.5 - 5.1 mEq/L   Chloride 101 96 - 112 mEq/L   CO2 24 19 - 32 mEq/L   Glucose, Bld 149 (H) 70 - 99 mg/dL   BUN 14 6 - 23 mg/dL   Creatinine, Ser 0.89 0.40 - 1.50 mg/dL   GFR 98.76 >60.00 mL/min   Calcium 9.8 8.4 - 10.5 mg/dL   Assessment & Plan:   Problem List Items Addressed This Visit       Other   COVID-19 - Primary    Patient tested positive for COVID-19.  Patient is vaccinated against COVID with Moderna x2.  Given patient's age habitus and comorbidities qualify for antiviral treatment.  Discussed treatment options with patient including lesion emergency use authorized only medications.  After education and joint decision-making decided to use molnupiravir.  Discussed signs and symptoms as when to be seen urgently or emergently.  Also discussed CDC guidelines in regards to quarantining.  Patient states he does stay at home and was curious about his wife.  Discussed CDC guidelines regarding to the vaccinated person is asymptomatic.  Patient to start medication as soon as possible. Start molnupiravir       Relevant Medications   molnupiravir EUA (LAGEVRIO) 200 mg CAPS capsule     No orders of the defined types were placed in this encounter.  No orders of the defined types were placed in this encounter.   I discussed the assessment and treatment plan with the patient. The patient was provided an opportunity to ask questions and all were answered. The patient agreed with the plan and demonstrated an understanding of the instructions. The patient was advised to call back or seek an in-person evaluation if the  symptoms worsen or if the condition fails to improve as anticipated.  Follow up plan: Return if symptoms worsen or fail to improve.  Romilda Garret, NP

## 2021-08-19 NOTE — Assessment & Plan Note (Signed)
Patient tested positive for COVID-19.  Patient is vaccinated against COVID with Moderna x2.  Given patient's age habitus and comorbidities qualify for antiviral treatment.  Discussed treatment options with patient including lesion emergency use authorized only medications.  After education and joint decision-making decided to use molnupiravir.  Discussed signs and symptoms as when to be seen urgently or emergently.  Also discussed CDC guidelines in regards to quarantining.  Patient states he does stay at home and was curious about his wife.  Discussed CDC guidelines regarding to the vaccinated person is asymptomatic.  Patient to start medication as soon as possible. Start molnupiravir

## 2021-08-19 NOTE — Telephone Encounter (Signed)
Patient currently has appointment at [facility] @0940  today.

## 2021-08-19 NOTE — Telephone Encounter (Signed)
Patient informed, Due to the high volume of calls and your symptoms we have to forward your call to our Triage Nurse to expedient your call. Please hold for the transfer.  Patient transferred to [facility] at Access Nurse. Due to taking a COVID test this morning that was positive with a faint line with symptoms of a stuffy nose and a fever.He was around friends this weekend who tested positive for COVID and he is unsure what to take.No openings in office or virtual.Patient disconnected the call before transfer.[facility] will send a chart over to the nurse and they will call the patient to triage him.

## 2021-08-19 NOTE — Telephone Encounter (Signed)
Providing access nurse documentation.      

## 2021-08-27 ENCOUNTER — Telehealth (INDEPENDENT_AMBULATORY_CARE_PROVIDER_SITE_OTHER): Payer: Medicare Other | Admitting: Internal Medicine

## 2021-08-27 ENCOUNTER — Encounter: Payer: Self-pay | Admitting: Internal Medicine

## 2021-08-27 DIAGNOSIS — K219 Gastro-esophageal reflux disease without esophagitis: Secondary | ICD-10-CM | POA: Diagnosis not present

## 2021-08-27 DIAGNOSIS — U071 COVID-19: Secondary | ICD-10-CM

## 2021-08-27 DIAGNOSIS — I10 Essential (primary) hypertension: Secondary | ICD-10-CM | POA: Diagnosis not present

## 2021-08-27 DIAGNOSIS — E1149 Type 2 diabetes mellitus with other diabetic neurological complication: Secondary | ICD-10-CM | POA: Diagnosis not present

## 2021-08-27 DIAGNOSIS — I700 Atherosclerosis of aorta: Secondary | ICD-10-CM

## 2021-08-27 DIAGNOSIS — K7460 Unspecified cirrhosis of liver: Secondary | ICD-10-CM | POA: Diagnosis not present

## 2021-08-27 DIAGNOSIS — E7800 Pure hypercholesterolemia, unspecified: Secondary | ICD-10-CM | POA: Diagnosis not present

## 2021-08-27 DIAGNOSIS — M545 Low back pain, unspecified: Secondary | ICD-10-CM

## 2021-08-27 DIAGNOSIS — G8929 Other chronic pain: Secondary | ICD-10-CM

## 2021-08-27 DIAGNOSIS — F439 Reaction to severe stress, unspecified: Secondary | ICD-10-CM | POA: Diagnosis not present

## 2021-08-27 NOTE — Progress Notes (Signed)
[name], male   DOB: 11-12-1971, 50 y.o.   MRN: [id number]   Virtual Visit via video Note  This visit type was conducted due to national recommendations for restrictions regarding the COVID-19 pandemic (e.g. social distancing).  This format is felt to be most appropriate for this patient at this time.  All issues noted in this document were discussed and addressed.  No physical exam was performed (except for noted visual exam findings with Video Visits).   I connected with Cameron Sanchez by a video enabled telemedicine application and verified that I am speaking with the correct person using two identifiers. Location patient: home Location provider: work Persons participating in the virtual visit: patient, provider  The limitations, risks, security and privacy concerns of performing an evaluation and management service by video and the availability of in person appointments have been discussed.  It has also been discussed with the patient that there may be a patient responsible charge related to this service. The patient expressed understanding and agreed to proceed.    Reason for visit: scheduled follow up.   HPI: Follow up appt to f/u his blood pressure, diabetes and cholesterol.  Recently diagnosed with covid - evaluated 08/19/21.  Treated with molnupiravir.  Was having increased head congestion.  Symptoms have improved.  Still with fatigue.  Still head/nasal congestion.  No sore throat.  No significant cough.  No chest pain or sob reported.  Eating.  No nausea or vomiting.  Bowels stable.  Taking nyquil.  Now seeing pain clinic in [place].  Discussed diet and exercise.  Now on metformin.  Not checking sugars.  Discussed diet and exercise.  Needs to check blood sugars.  Had to cancel GI appt.  Discussed the need to reschedule.    ROS: See pertinent positives and negatives per HPI.  Past Medical History:  Diagnosis Date   Allergy    Chronic back pain    Hypercholesterolemia     Hypertension    Migraines     Past Surgical History:  Procedure Laterality Date   BACK SURGERY  08/12/14   multiple   CHOLECYSTECTOMY     KNEE ARTHROSCOPY     Dr HBK    Family History  Problem Relation Age of Onset   Cancer Father        germ cell (retroperitoneal)   Coronary artery disease Other        s/p stent (age 22)   Hypertension Paternal Grandfather    Kidney cancer Paternal Grandfather    Diabetes Paternal Grandfather    Diabetes Maternal Grandfather    Heart disease Cousin        MI - age 88    SOCIAL HX: reviewed.    Current Outpatient Medications:    allopurinol (ZYLOPRIM) 300 MG tablet, TAKE 1 TABLET BY MOUTH EVERY DAY, Disp: 90 tablet, Rfl: 0   atorvastatin (LIPITOR) 40 MG tablet, TAKE 1 TABLET BY MOUTH EVERY DAY (Patient taking differently: Take 40 mg by mouth daily.), Disp: 30 tablet, Rfl: 2   azelastine (ASTELIN) 0.1 % nasal spray, Place 2 sprays into both nostrils daily as needed for rhinitis. Use in each nostril as directed after nasal saline and flonase, Disp: 30 mL, Rfl: 2   fluticasone (FLONASE) 50 MCG/ACT nasal spray, Place 2 sprays into both nostrils daily as needed for allergies or rhinitis. After nasal saline, Disp: 16 g, Rfl: 2   gabapentin (NEURONTIN) 400 MG capsule, Take 400 mg by mouth 2 (two) times  daily., Disp: , Rfl:    metaxalone (SKELAXIN) 800 MG tablet, Take by mouth., Disp: , Rfl:    metFORMIN (GLUCOPHAGE) 500 MG tablet, Take 1 tablet (500 mg total) by mouth 2 (two) times daily with a meal., Disp: 180 tablet, Rfl: 1   metoprolol succinate (TOPROL-XL) 50 MG 24 hr tablet, TAKE 2 TABLETS BY MOUTH DAILY WITH OR IMMEDIATELY FOLLOWING A MEAL., Disp: 180 tablet, Rfl: 0   Oxycodone HCl 10 MG TABS, Take 10 mg by mouth every 6 (six) hours as needed (for pain)., Disp: , Rfl:    pantoprazole (PROTONIX) 40 MG tablet, TAKE 1 TABLET (40 MG TOTAL) BY MOUTH 2 (TWO) TIMES DAILY BEFORE A MEAL., Disp: 180 tablet, Rfl: 1   sodium chloride (OCEAN) 0.65 % SOLN  nasal spray, Place 2 sprays into both nostrils daily as needed for congestion., Disp: 30 mL, Rfl: 2   triamterene-hydrochlorothiazide (MAXZIDE-25) 37.5-25 MG tablet, TAKE 1 TABLET BY MOUTH EVERY DAY, Disp: 90 tablet, Rfl: 0  EXAM:  GENERAL: alert, oriented, appears well and in no acute distress  HEENT: atraumatic, conjunttiva clear, no obvious abnormalities on inspection of external nose and ears  NECK: normal movements of the head and neck  LUNGS: on inspection no signs of respiratory distress, breathing rate appears normal, no obvious gross SOB, gasping or wheezing  CV: no obvious cyanosis  PSYCH/NEURO: pleasant and cooperative, no obvious depression or anxiety, speech and thought processing grossly intact  ASSESSMENT AND PLAN:  Discussed the following assessment and plan:  Problem List Items Addressed This Visit     Aortic atherosclerosis (HCC)    Continue lipitor.       Chronic back pain    Being followed by pain clinic in [place].        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   Follow liver function tests.  Reschedule appt with GI.        COVID-19    Recently tested positive.  Treated with molnupiravir.  Is doing better.  Discussed mucinex, saline nasal spray and steroid nasal spray.  No sob.  No chest pain.  Rest.  Follow.        GERD (gastroesophageal reflux disease)    Continue protonix.  No upper symptoms reported.       Hypercholesterolemia    On lipitor.  Low cholesterol diet and exercise.  Follow lipid panel and liver function tests.        Relevant Orders   CBC with Differential/Platelet   Hepatic function panel   Lipid panel   Hypertension    Blood pressure doing well.  Continue triam/hctz.  Follow pressures.  Follow metabolic panel.       Stress    Overall appears to be handling things relatively well.  Follow.       Type 2 diabetes mellitus with neurological complications (HCC)    Low carb diet and exercise.  Continue metformin.   Follow met b and a1c.       Relevant Orders   Hemoglobin N2T   Basic metabolic panel    Return in about 2 months (around 10/27/2021) for follow up appt (87mn).   I discussed the assessment and treatment plan with the patient. The patient was provided an opportunity to ask questions and all were answered. The patient agreed with the plan and demonstrated an understanding of the instructions.   The patient was advised to call back or seek an in-person evaluation if the symptoms worsen or  if the condition fails to improve as anticipated.    Einar Pheasant, MD

## 2021-09-06 ENCOUNTER — Encounter: Payer: Self-pay | Admitting: Internal Medicine

## 2021-09-06 NOTE — Assessment & Plan Note (Signed)
On lipitor.  Low cholesterol diet and exercise.  Follow lipid panel and liver function tests.   

## 2021-09-06 NOTE — Assessment & Plan Note (Signed)
Blood pressure doing well.  Continue triam/hctz.  Follow pressures.  Follow metabolic panel.  

## 2021-09-06 NOTE — Assessment & Plan Note (Signed)
Being followed by pain clinic in [place].

## 2021-09-06 NOTE — Assessment & Plan Note (Signed)
Low carb diet and exercise.  Continue metformin.  Follow met b and a1c.  

## 2021-09-06 NOTE — Assessment & Plan Note (Signed)
Recently tested positive.  Treated with molnupiravir.  Is doing better.  Discussed mucinex, saline nasal spray and steroid nasal spray.  No sob.  No chest pain.  Rest.  Follow.

## 2021-09-06 NOTE — Assessment & Plan Note (Signed)
Continue lipitor  ?

## 2021-09-06 NOTE — Assessment & Plan Note (Signed)
Noted on recent CT.   Follow liver function tests.  Reschedule appt with GI.

## 2021-09-06 NOTE — Assessment & Plan Note (Signed)
Continue protonix.  No upper symptoms reported.  

## 2021-09-06 NOTE — Assessment & Plan Note (Signed)
Overall appears to be handling things relatively well.  Follow.   

## 2021-09-07 ENCOUNTER — Other Ambulatory Visit: Payer: Self-pay | Admitting: Internal Medicine

## 2021-09-10 DIAGNOSIS — M961 Postlaminectomy syndrome, not elsewhere classified: Secondary | ICD-10-CM | POA: Diagnosis not present

## 2021-09-10 DIAGNOSIS — M545 Low back pain, unspecified: Secondary | ICD-10-CM | POA: Diagnosis not present

## 2021-09-10 DIAGNOSIS — Z79891 Long term (current) use of opiate analgesic: Secondary | ICD-10-CM | POA: Diagnosis not present

## 2021-09-10 DIAGNOSIS — G894 Chronic pain syndrome: Secondary | ICD-10-CM | POA: Diagnosis not present

## 2021-09-18 ENCOUNTER — Other Ambulatory Visit: Payer: Self-pay | Admitting: Internal Medicine

## 2021-10-06 DIAGNOSIS — M961 Postlaminectomy syndrome, not elsewhere classified: Secondary | ICD-10-CM | POA: Diagnosis not present

## 2021-10-06 DIAGNOSIS — M545 Low back pain, unspecified: Secondary | ICD-10-CM | POA: Diagnosis not present

## 2021-10-06 DIAGNOSIS — G894 Chronic pain syndrome: Secondary | ICD-10-CM | POA: Diagnosis not present

## 2021-10-06 DIAGNOSIS — Z79891 Long term (current) use of opiate analgesic: Secondary | ICD-10-CM | POA: Diagnosis not present

## 2021-11-13 ENCOUNTER — Other Ambulatory Visit: Payer: Self-pay

## 2021-11-13 ENCOUNTER — Ambulatory Visit (INDEPENDENT_AMBULATORY_CARE_PROVIDER_SITE_OTHER): Payer: Medicare Other | Admitting: Nurse Practitioner

## 2021-11-13 ENCOUNTER — Encounter: Payer: Self-pay | Admitting: Nurse Practitioner

## 2021-11-13 DIAGNOSIS — J029 Acute pharyngitis, unspecified: Secondary | ICD-10-CM

## 2021-11-13 DIAGNOSIS — R051 Acute cough: Secondary | ICD-10-CM | POA: Diagnosis not present

## 2021-11-13 DIAGNOSIS — R5383 Other fatigue: Secondary | ICD-10-CM

## 2021-11-13 DIAGNOSIS — G4489 Other headache syndrome: Secondary | ICD-10-CM

## 2021-11-13 LAB — POCT INFLUENZA A/B
Influenza A, POC: NEGATIVE
Influenza B, POC: NEGATIVE

## 2021-11-13 LAB — POCT RAPID STREP A (OFFICE): Rapid Strep A Screen: NEGATIVE

## 2021-11-13 LAB — POC COVID19 BINAXNOW: SARS Coronavirus 2 Ag: NEGATIVE

## 2021-11-13 NOTE — Assessment & Plan Note (Signed)
Continue over-the-counter treatments as needed.

## 2021-11-13 NOTE — Progress Notes (Signed)
[name], male    DOB: 02-07-1971, 50 y.o.   MRN: [id number]  Virtual visit completed through [place], a video enabled telemedicine application. Due to national recommendations of social distancing due to COVID-19, a virtual visit is felt to be most appropriate for this patient at this time. Reviewed limitations, risks, security and privacy concerns of performing a virtual visit and the availability of in person appointments. I also reviewed that there may be a patient responsible charge related to this service. The patient agreed to proceed.   Patient location: home Provider location: [place] at [facility], office Persons participating in this virtual visit: patient, provider   If any vitals were documented, they were collected by patient at home unless specified below.    There were no vitals taken for this visit.   CC: URI Subjective:   HPI: Cameron Sanchez is a 50 y.o. male presenting on 11/13/2021 for URI (Started 2 nights ago with stuffy nose, sore throat, head pain. Cough started this morning along with ear fullness. Did home Covid test yesterday and it was negative. First 2 covid vaccines.)  Symptoms started on 11/11/2021 afternoon Covid test at home and was negative Vaccines x2 and 2 boosters No sick contacts Has been taking nyquill and nose spray. Some improvement with OTC treatment   Relevant past medical, surgical, family and social history reviewed and updated as indicated. Interim medical history since our last visit reviewed. Allergies and medications reviewed and updated. Outpatient Medications Prior to Visit  Medication Sig Dispense Refill   allopurinol (ZYLOPRIM) 300 MG tablet TAKE 1 TABLET BY MOUTH EVERY DAY 90 tablet 0   atorvastatin (LIPITOR) 40 MG tablet TAKE 1 TABLET BY MOUTH EVERY DAY (Patient taking differently: Take 40 mg by mouth daily.) 30 tablet 2   azelastine (ASTELIN) 0.1 % nasal spray Place 2 sprays into both nostrils daily as  needed for rhinitis. Use in each nostril as directed after nasal saline and flonase 30 mL 2   fluticasone (FLONASE) 50 MCG/ACT nasal spray Place 2 sprays into both nostrils daily as needed for allergies or rhinitis. After nasal saline 16 g 2   metaxalone (SKELAXIN) 800 MG tablet Take 800 mg by mouth 3 (three) times daily.     metFORMIN (GLUCOPHAGE) 500 MG tablet Take 1 tablet (500 mg total) by mouth 2 (two) times daily with a meal. 180 tablet 1   metoprolol succinate (TOPROL-XL) 50 MG 24 hr tablet TAKE 2 TABLETS BY MOUTH DAILY WITH OR IMMEDIATELY FOLLOWING A MEAL. 180 tablet 0   Oxycodone HCl 10 MG TABS Take 10 mg by mouth every 6 (six) hours as needed (for pain).     pantoprazole (PROTONIX) 40 MG tablet TAKE 1 TABLET (40 MG TOTAL) BY MOUTH 2 (TWO) TIMES DAILY BEFORE A MEAL. 180 tablet 1   sodium chloride (OCEAN) 0.65 % SOLN nasal spray Place 2 sprays into both nostrils daily as needed for congestion. 30 mL 2   triamterene-hydrochlorothiazide (MAXZIDE-25) 37.5-25 MG tablet TAKE 1 TABLET BY MOUTH EVERY DAY 90 tablet 0   gabapentin (NEURONTIN) 400 MG capsule Take 400 mg by mouth 2 (two) times daily.     metaxalone (SKELAXIN) 800 MG tablet Take by mouth.     No facility-administered medications prior to visit.     Per HPI unless specifically indicated in ROS section below Review of Systems  Constitutional:  Positive for fatigue. Negative for chills and fever.  HENT:  Positive for congestion, ear pain (  fullness) and sore throat. Negative for sinus pressure and sinus pain. Trouble swallowing: poc flut.  Respiratory:  Positive for cough. Negative for shortness of breath.   Cardiovascular:  Negative for chest pain.  Gastrointestinal:  Negative for abdominal pain, diarrhea, nausea and vomiting.  Musculoskeletal:  Negative for arthralgias and myalgias.  Neurological:  Positive for headaches.  Objective:  There were no vitals taken for this visit.  Wt Readings from Last 3 Encounters:  08/27/21 221  lb (100.2 kg)  05/25/21 221 lb 3.2 oz (100.3 kg)  02/18/21 221 lb (100.2 kg)      Physical exam: Gen: alert, NAD, not ill appearing Pulm: speaks in complete sentences without increased work of breathing Psych: normal mood, normal thought content      Results for orders placed or performed in visit on 07/08/21  Platelet count  Result Value Ref Range   Platelets 143 140 - 400 Thousand/uL  Basic metabolic panel  Result Value Ref Range   Sodium 136 135 - 145 mEq/L   Potassium 4.1 3.5 - 5.1 mEq/L   Chloride 101 96 - 112 mEq/L   CO2 24 19 - 32 mEq/L   Glucose, Bld 149 (H) 70 - 99 mg/dL   BUN 14 6 - 23 mg/dL   Creatinine, Ser 0.89 0.40 - 1.50 mg/dL   GFR 98.76 >60.00 mL/min   Calcium 9.8 8.4 - 10.5 mg/dL   Assessment & Plan:   Problem List Items Addressed This Visit       Other   Other fatigue - Primary    Continue resting staying hydrated over-the-counter treatments for symptom patient's COVID flu and strep testing all negative in office today.  Did review signs and symptoms when to seek urgent or emergent health care.  With patient      Relevant Orders   POC COVID-19 (Completed)   Influenza A/B (Completed)   Acute cough    Continue over-the-counter treatments as needed.      Relevant Orders   POC COVID-19 (Completed)   Influenza A/B (Completed)   Other headache syndrome   Relevant Medications   metaxalone (SKELAXIN) 800 MG tablet   Other Relevant Orders   POC COVID-19 (Completed)   Influenza A/B (Completed)   Sore throat    Patient can use over-the-counter analgesics as needed for sore throat.  Can gargle warm salt water and stay hydrated      Relevant Orders   POC COVID-19 (Completed)   Rapid Strep A (Completed)   Influenza A/B (Completed)     No orders of the defined types were placed in this encounter.  No orders of the defined types were placed in this encounter.   I discussed the assessment and treatment plan with the patient. The patient was  provided an opportunity to ask questions and all were answered. The patient agreed with the plan and demonstrated an understanding of the instructions. The patient was advised to call back or seek an in-person evaluation if the symptoms worsen or if the condition fails to improve as anticipated.  Follow up plan: No follow-ups on file.  Romilda Garret, NP

## 2021-11-13 NOTE — Assessment & Plan Note (Signed)
Patient can use over-the-counter analgesics as needed for sore throat.  Can gargle warm salt water and stay hydrated

## 2021-11-13 NOTE — Assessment & Plan Note (Signed)
Continue resting staying hydrated over-the-counter treatments for symptom patient's COVID flu and strep testing all negative in office today.  Did review signs and symptoms when to seek urgent or emergent health care.  With patient

## 2021-11-17 ENCOUNTER — Telehealth: Payer: Self-pay | Admitting: Internal Medicine

## 2021-11-17 ENCOUNTER — Other Ambulatory Visit: Payer: Self-pay | Admitting: Nurse Practitioner

## 2021-11-17 DIAGNOSIS — M545 Low back pain, unspecified: Secondary | ICD-10-CM | POA: Diagnosis not present

## 2021-11-17 DIAGNOSIS — Z79891 Long term (current) use of opiate analgesic: Secondary | ICD-10-CM | POA: Diagnosis not present

## 2021-11-17 DIAGNOSIS — M5137 Other intervertebral disc degeneration, lumbosacral region: Secondary | ICD-10-CM | POA: Diagnosis not present

## 2021-11-17 DIAGNOSIS — G894 Chronic pain syndrome: Secondary | ICD-10-CM | POA: Diagnosis not present

## 2021-11-17 DIAGNOSIS — M961 Postlaminectomy syndrome, not elsewhere classified: Secondary | ICD-10-CM | POA: Diagnosis not present

## 2021-11-17 MED ORDER — AMOXICILLIN-POT CLAVULANATE 875-125 MG PO TABS: 1.0000 | ORAL_TABLET | Freq: Two times a day (BID) | ORAL | 0 refills | Status: AC

## 2021-11-17 NOTE — Telephone Encounter (Signed)
Patient was seen on 12/30 by NP for acute visit. Was advised to call back if symptoms persist. Pt is requesting abx. Has upcoming appt with NP on 1/6 but would like to have medication before then since he was just seen. OTC recommendations have not helped him. COVID and Flu swabs negative.

## 2021-11-17 NOTE — Telephone Encounter (Signed)
Called and spoke with patient and sent in abx

## 2021-11-17 NOTE — Telephone Encounter (Signed)
Patient called in stated had a sick appt  and still not feeling well. Patient was told to call back to get medication if needed, Patient symptoms are running nose coughing, face feel on fire.  Xfer access nurse

## 2021-11-20 ENCOUNTER — Ambulatory Visit: Payer: Medicare Other | Admitting: Family

## 2021-12-05 ENCOUNTER — Other Ambulatory Visit: Payer: Self-pay | Admitting: Internal Medicine

## 2021-12-15 DIAGNOSIS — Z79891 Long term (current) use of opiate analgesic: Secondary | ICD-10-CM | POA: Diagnosis not present

## 2021-12-15 DIAGNOSIS — M961 Postlaminectomy syndrome, not elsewhere classified: Secondary | ICD-10-CM | POA: Diagnosis not present

## 2021-12-15 DIAGNOSIS — G894 Chronic pain syndrome: Secondary | ICD-10-CM | POA: Diagnosis not present

## 2021-12-15 DIAGNOSIS — M545 Low back pain, unspecified: Secondary | ICD-10-CM | POA: Diagnosis not present

## 2021-12-15 DIAGNOSIS — M5137 Other intervertebral disc degeneration, lumbosacral region: Secondary | ICD-10-CM | POA: Diagnosis not present

## 2022-01-12 DIAGNOSIS — M5137 Other intervertebral disc degeneration, lumbosacral region: Secondary | ICD-10-CM | POA: Diagnosis not present

## 2022-01-12 DIAGNOSIS — Z79891 Long term (current) use of opiate analgesic: Secondary | ICD-10-CM | POA: Diagnosis not present

## 2022-01-12 DIAGNOSIS — M961 Postlaminectomy syndrome, not elsewhere classified: Secondary | ICD-10-CM | POA: Diagnosis not present

## 2022-01-12 DIAGNOSIS — G894 Chronic pain syndrome: Secondary | ICD-10-CM | POA: Diagnosis not present

## 2022-01-12 DIAGNOSIS — M545 Low back pain, unspecified: Secondary | ICD-10-CM | POA: Diagnosis not present

## 2022-02-09 DIAGNOSIS — Z79891 Long term (current) use of opiate analgesic: Secondary | ICD-10-CM | POA: Diagnosis not present

## 2022-02-09 DIAGNOSIS — G894 Chronic pain syndrome: Secondary | ICD-10-CM | POA: Diagnosis not present

## 2022-02-11 ENCOUNTER — Other Ambulatory Visit: Payer: Self-pay | Admitting: Internal Medicine

## 2022-02-12 ENCOUNTER — Other Ambulatory Visit: Payer: Self-pay | Admitting: Internal Medicine

## 2022-02-19 ENCOUNTER — Ambulatory Visit: Payer: Medicare Other

## 2022-02-24 ENCOUNTER — Other Ambulatory Visit: Payer: Self-pay

## 2022-02-24 ENCOUNTER — Ambulatory Visit (INDEPENDENT_AMBULATORY_CARE_PROVIDER_SITE_OTHER): Payer: Medicare Other

## 2022-02-24 VITALS — Ht 67.0 in | Wt 221.0 lb

## 2022-02-24 DIAGNOSIS — Z1211 Encounter for screening for malignant neoplasm of colon: Secondary | ICD-10-CM

## 2022-02-24 DIAGNOSIS — Z0000 Encounter for general adult medical examination without abnormal findings: Secondary | ICD-10-CM | POA: Diagnosis not present

## 2022-02-24 MED ORDER — NA SULFATE-K SULFATE-MG SULF 17.5-3.13-1.6 GM/177ML PO SOLN: 1.0000 | Freq: Once | ORAL | 0 refills | Status: AC

## 2022-02-24 NOTE — Patient Instructions (Addendum)
Cameron Sanchez , Thank you for taking time to come for your Medicare Wellness Visit. I appreciate your ongoing commitment to your health goals. Please review the following plan we discussed and let me know if I can assist you in the future.   These are the goals we discussed:  Goals      DIET - low carb diet/high fiber        This is a list of the screening recommended for you and due dates:  Health Maintenance  Topic Date Due   Colon Cancer Screening  Never done   Hemoglobin A1C  11/25/2021   Urine Protein Check  02/26/2022*   COVID-19 Vaccine (5 - Booster) 03/12/2022*   Eye exam for diabetics  03/26/2022*   Zoster (Shingles) Vaccine (1 of 2) 05/26/2022*   Complete foot exam   05/25/2022   Flu Shot  06/15/2022   Tetanus Vaccine  11/29/2030   Hepatitis C Screening: USPSTF Recommendation to screen - Ages 18-79 yo.  Completed   HIV Screening  Completed   HPV Vaccine  Aged Out  *Topic was postponed. The date shown is not the original due date.    Advanced directives: not yet completed  Conditions/risks identified:  -Colonoscopy due- ordered. Facility to contact patient for scheduling. Follow up with PCP.  -Patient is holding metformin due to potential future side effects. No current side effects reported. Discuss with PCP.  -Patient requests protonix be ordered less than 180 tablets per order to lower cost of medication at time of pick up or change medication to something similar the Good Rx will cover. Not currently taking. Discuss with PCP.  Next scheduled office visit with PCP 02/26/22. Keep all routine maintenance appointments.   Next appointment: Follow up in one year for your annual wellness visit   Preventive Care 40-64 Years, Male Preventive care refers to lifestyle choices and visits with your health care provider that can promote health and wellness. What does preventive care include? A yearly physical exam. This is also called an annual well check. Dental exams once or  twice a year. Routine eye exams. Ask your health care provider how often you should have your eyes checked. Personal lifestyle choices, including: Daily care of your teeth and gums. Regular physical activity. Eating a healthy diet. Avoiding tobacco and drug use. Limiting alcohol use. Practicing safe sex. Taking low-dose aspirin every day starting at age 11. What happens during an annual well check? The services and screenings done by your health care provider during your annual well check will depend on your age, overall health, lifestyle risk factors, and family history of disease. Counseling  Your health care provider may ask you questions about your: Alcohol use. Tobacco use. Drug use. Emotional well-being. Home and relationship well-being. Sexual activity. Eating habits. Work and work Astronomer. Screening  You may have the following tests or measurements: Height, weight, and BMI. Blood pressure. Lipid and cholesterol levels. These may be checked every 5 years, or more frequently if you are over 84 years old. Skin check. Lung cancer screening. You may have this screening every year starting at age 1 if you have a 30-pack-year history of smoking and currently smoke or have quit within the past 15 years. Fecal occult blood test (FOBT) of the stool. You may have this test every year starting at age 75. Flexible sigmoidoscopy or colonoscopy. You may have a sigmoidoscopy every 5 years or a colonoscopy every 10 years starting at age 105. Prostate cancer screening. Recommendations will  vary depending on your family history and other risks. Hepatitis C blood test. Hepatitis B blood test. Sexually transmitted disease (STD) testing. Diabetes screening. This is done by checking your blood sugar (glucose) after you have not eaten for a while (fasting). You may have this done every 1-3 years. Discuss your test results, treatment options, and if necessary, the need for more tests with your  health care provider. Vaccines  Your health care provider may recommend certain vaccines, such as: Influenza vaccine. This is recommended every year. Tetanus, diphtheria, and acellular pertussis (Tdap, Td) vaccine. You may need a Td booster every 10 years. Zoster vaccine. You may need this after age 60. Pneumococcal 13-valent conjugate (PCV13) vaccine. You may need this if you have certain conditions and have not been vaccinated. Pneumococcal polysaccharide (PPSV23) vaccine. You may need one or two doses if you smoke cigarettes or if you have certain conditions. Talk to your health care provider about which screenings and vaccines you need and how often you need them. This information is not intended to replace advice given to you by your health care provider. Make sure you discuss any questions you have with your health care provider. Document Released: 11/28/2015 Document Revised: 07/21/2016 Document Reviewed: 09/02/2015 Elsevier Interactive Patient Education  2017 [employer].  Fall Prevention in the Home Falls can cause injuries. They can happen to people of all ages. There are many things you can do to make your home safe and to help prevent falls. What can I do on the outside of my home? Regularly fix the edges of walkways and driveways and fix any cracks. Remove anything that might make you trip as you walk through a door, such as a raised step or threshold. Trim any bushes or trees on the path to your home. Use bright outdoor lighting. Clear any walking paths of anything that might make someone trip, such as rocks or tools. Regularly check to see if handrails are loose or broken. Make sure that both sides of any steps have handrails. Any raised decks and porches should have guardrails on the edges. Have any leaves, snow, or ice cleared regularly. Use sand or salt on walking paths during winter. Clean up any spills in your garage right away. This includes oil or grease spills. What  can I do in the bathroom? Use night lights. Install grab bars by the toilet and in the tub and shower. Do not use towel bars as grab bars. Use non-skid mats or decals in the tub or shower. If you need to sit down in the shower, use a plastic, non-slip stool. Keep the floor dry. Clean up any water that spills on the floor as soon as it happens. Remove soap buildup in the tub or shower regularly. Attach bath mats securely with double-sided non-slip rug tape. Do not have throw rugs and other things on the floor that can make you trip. What can I do in the bedroom? Use night lights. Make sure that you have a light by your bed that is easy to reach. Do not use any sheets or blankets that are too big for your bed. They should not hang down onto the floor. Have a firm chair that has side arms. You can use this for support while you get dressed. Do not have throw rugs and other things on the floor that can make you trip. What can I do in the kitchen? Clean up any spills right away. Avoid walking on wet floors. Keep items that  you use a lot in easy-to-reach places. If you need to reach something above you, use a strong step stool that has a grab bar. Keep electrical cords out of the way. Do not use floor polish or wax that makes floors slippery. If you must use wax, use non-skid floor wax. Do not have throw rugs and other things on the floor that can make you trip. What can I do with my stairs? Do not leave any items on the stairs. Make sure that there are handrails on both sides of the stairs and use them. Fix handrails that are broken or loose. Make sure that handrails are as long as the stairways. Check any carpeting to make sure that it is firmly attached to the stairs. Fix any carpet that is loose or worn. Avoid having throw rugs at the top or bottom of the stairs. If you do have throw rugs, attach them to the floor with carpet tape. Make sure that you have a light switch at the top of the  stairs and the bottom of the stairs. If you do not have them, ask someone to add them for you. What else can I do to help prevent falls? Wear shoes that: Do not have high heels. Have rubber bottoms. Are comfortable and fit you well. Are closed at the toe. Do not wear sandals. If you use a stepladder: Make sure that it is fully opened. Do not climb a closed stepladder. Make sure that both sides of the stepladder are locked into place. Ask someone to hold it for you, if possible. Clearly mark and make sure that you can see: Any grab bars or handrails. First and last steps. Where the edge of each step is. Use tools that help you move around (mobility aids) if they are needed. These include: Canes. Walkers. Scooters. Crutches. Turn on the lights when you go into a dark area. Replace any light bulbs as soon as they burn out. Set up your furniture so you have a clear path. Avoid moving your furniture around. If any of your floors are uneven, fix them. If there are any pets around you, be aware of where they are. Review your medicines with your doctor. Some medicines can make you feel dizzy. This can increase your chance of falling. Ask your doctor what other things that you can do to help prevent falls. This information is not intended to replace advice given to you by your health care provider. Make sure you discuss any questions you have with your health care provider. Document Released: 08/28/2009 Document Revised: 04/08/2016 Document Reviewed: 12/06/2014 Elsevier Interactive Patient Education  2017 [employer]  Opioid Pain Medicine Management Opioids are powerful medicines that are used to treat moderate to severe pain. When used for short periods of time, they can help you to: Sleep better. Do better in physical or occupational therapy. Feel better in the first few days after an injury. Recover from surgery. Opioids should be taken with the supervision of a trained health care  provider. They should be taken for the shortest period of time possible. This is because opioids can be addictive, and the longer you take opioids, the greater your risk of addiction. This addiction can also be called opioid use disorder. What are the risks? Using opioid pain medicines for longer than 3 days increases your risk of side effects. Side effects include: Constipation. Nausea and vomiting. Breathing difficulties (respiratory depression). Drowsiness. Confusion. Opioid use disorder. Itching. Taking opioid pain medicine for a  long period of time can affect your ability to do daily tasks. It also puts you at risk for: Motor vehicle crashes. Depression. Suicide. Heart attack. Overdose, which can be life-threatening. What is a pain treatment plan? A pain treatment plan is an agreement between you and your health care provider. Pain is unique to each person, and treatments vary depending on your condition. To manage your pain, you and your health care provider need to work together. To help you do this: Discuss the goals of your treatment, including how much pain you might expect to have and how you will manage the pain. Review the risks and benefits of taking opioid medicines. Remember that a good treatment plan uses more than one approach and minimizes the chance of side effects. Be honest about the amount of medicines you take and about any drug or alcohol use. Get pain medicine prescriptions from only one health care provider. Pain can be managed with many types of alternative treatments. Ask your health care provider to refer you to one or more specialists who can help you manage pain through: Physical or occupational therapy. Counseling (cognitive behavioral therapy). Good nutrition. Biofeedback. Massage. Meditation. Non-opioid medicine. Following a gentle exercise program. How to use opioid pain medicine Taking medicine Take your pain medicine exactly as told by your  health care provider. Take it only when you need it. If your pain gets less severe, you may take less than your prescribed dose if your health care provider approves. If you are not having pain, do nottake pain medicine unless your health care provider tells you to take it. If your pain is severe, do nottry to treat it yourself by taking more pills than instructed on your prescription. Contact your health care provider for help. Write down the times when you take your pain medicine. It is easy to become confused while on pain medicine. Writing the time can help you avoid overdose. Take other over-the-counter or prescription medicines only as told by your health care provider. Keeping yourself and others safe  While you are taking opioid pain medicine: Do not drive, use machinery, or power tools. Do not sign legal documents. Do not drink alcohol. Do not take sleeping pills. Do not supervise children by yourself. Do not do activities that require climbing or being in high places. Do not go to a lake, river, ocean, spa, or swimming pool. Do not share your pain medicine with anyone. Keep pain medicine in a locked cabinet or in a secure area where pets and children cannot reach it. Stopping your use of opioids If you have been taking opioid medicine for more than a few weeks, you may need to slowly decrease (taper) how much you take until you stop completely. Tapering your use of opioids can decrease your risk of symptoms of withdrawal, such as: Pain and cramping in the abdomen. Nausea. Sweating. Sleepiness. Restlessness. Uncontrollable shaking (tremors). Cravings for the medicine. Do not attempt to taper your use of opioids on your own. Talk with your health care provider about how to do this. Your health care provider may prescribe a step-down schedule based on how much medicine you are taking and how long you have been taking it. Getting rid of leftover pills Do not save any leftover  pills. Get rid of leftover pills safely by: Taking the medicine to a prescription take-back program. This is usually offered by the county or law enforcement. Bringing them to a pharmacy that has a drug disposal container. Flushing them  down the toilet. Check the label or package insert of your medicine to see whether this is safe to do. Throwing them out in the trash. Check the label or package insert of your medicine to see whether this is safe to do. If it is safe to throw it out, remove the medicine from the original container, put it into a sealable bag or container, and mix it with used coffee grounds, food scraps, dirt, or cat litter before putting it in the trash. Follow these instructions at home: Activity Do exercises as told by your health care provider. Avoid activities that make your pain worse. Return to your normal activities as told by your health care provider. Ask your health care provider what activities are safe for you. General instructions You may need to take these actions to prevent or treat constipation: Drink enough fluid to keep your urine pale yellow. Take over-the-counter or prescription medicines. Eat foods that are high in fiber, such as beans, whole grains, and fresh fruits and vegetables. Limit foods that are high in fat and processed sugars, such as fried or sweet foods. Keep all follow-up visits. This is important. Where to find support If you have been taking opioids for a long time, you may benefit from receiving support for quitting from a local support group or counselor. Ask your health care provider for a referral to these resources in your area. Where to find more information Centers for Disease Control and Prevention (CDC): FootballExhibition.com.br [place] Food and Drug Administration (FDA): PumpkinSearch.com.ee Get help right away if: You may have taken too much of an opioid (overdosed). Common symptoms of an overdose: Your breathing is slower or more shallow than  normal. You have a very slow heartbeat (pulse). You have slurred speech. You have nausea and vomiting. Your pupils become very small. You have other potential symptoms: You are very confused. You faint or feel like you will faint. You have cold, clammy skin. You have blue lips or fingernails. You have thoughts of harming yourself or harming others. These symptoms may represent a serious problem that is an emergency. Do not wait to see if the symptoms will go away. Get medical help right away. Call your local emergency services (911 in the [place] Do not drive yourself to the hospital.  If you ever feel like you may hurt yourself or others, or have thoughts about taking your own life, get help right away. Go to your nearest emergency department or: Call your local emergency services (911 in the [place] Call the [facility] (9847528736 in the [place] Call a suicide crisis helpline, such as the National Suicide Prevention Lifeline at (947)214-9965 or 988 in the [place] This is open 24 hours a day in the [place] Text the Crisis Text Line at 4456336616 (in the [place] Summary Opioid medicines can help you manage moderate to severe pain for a short period of time. A pain treatment plan is an agreement between you and your health care provider. Discuss the goals of your treatment, including how much pain you might expect to have and how you will manage the pain. If you think that you or someone else may have taken too much of an opioid, get medical help right away. This information is not intended to replace advice given to you by your health care provider. Make sure you discuss any questions you have with your health care provider. Document Revised: 05/27/2021 Document Reviewed: 02/11/2021 Elsevier Patient Education  2022 [employer].  Colonoscopy, Adult  A colonoscopy is a procedure to look at the entire large intestine. This procedure is done using a long, thin, flexible tube that  has a camera on the end. You may have a colonoscopy: As a part of normal colorectal screening. If you have certain symptoms, such as: A low number of red blood cells in your blood (anemia). Diarrhea that does not go away. Pain in your abdomen. Blood in your stool. A colonoscopy can help screen for and diagnose medical problems, including: Tumors. Extra tissue that grows where mucus forms (polyps). Inflammation. Areas of bleeding. Tell your health care provider about: Any allergies you have. All medicines you are taking, including vitamins, herbs, eye drops, creams, and over-the-counter medicines. Any problems you or family members have had with anesthetic medicines. Any blood disorders you have. Any surgeries you have had. Any medical conditions you have. Any problems you have had with having bowel movements. Whether you are pregnant or may be pregnant. What are the risks? Generally, this is a safe procedure. However, problems may occur, including: Bleeding. Damage to your intestine. Allergic reactions to medicines given during the procedure. Infection. This is rare. What happens before the procedure? Eating and drinking restrictions Follow instructions from your health care provider about eating or drinking restrictions, which may include: A few days before the procedure: Follow a low-fiber diet. Avoid nuts, seeds, dried fruit, raw fruits, and vegetables. 1-3 days before the procedure: Eat only gelatin dessert or ice pops. Drink only clear liquids, such as water, clear juice, clear broth or bouillon, black coffee or tea, or clear soft drinks or sports drinks. Avoid liquids that contain red or purple dye. The day of the procedure: Do not eat solid foods. You may continue to drink clear liquids until up to 2 hours before the procedure. Do not eat or drink anything starting 2 hours before the procedure, or within the time period that your health care provider recommends. Bowel  prep If you were prescribed a bowel prep to take by mouth (orally) to clean out your colon: Take it as told by your health care provider. Starting the day before your procedure, you will need to drink a large amount of liquid medicine. The liquid will cause you to have many bowel movements of loose stool until your stool becomes almost clear or light green. If your skin or the opening between the buttocks (anus) gets irritated from diarrhea, you may relieve the irritation using: Wipes with medicine in them, such as adult wet wipes with aloe and vitamin E. A product to soothe skin, such as petroleum jelly. If you vomit while drinking the bowel prep: Take a break for up to 60 minutes. Begin the bowel prep again. Call your health care provider if you keep vomiting or you cannot take the bowel prep without vomiting. To clean out your colon, you may also be given: Laxative medicines. These help you have a bowel movement. Instructions for enema use. An enema is liquid medicine injected into your rectum. Medicines Ask your health care provider about: Changing or stopping your regular medicines or supplements. This is especially important if you are taking iron supplements, diabetes medicines, or blood thinners. Taking medicines such as aspirin and ibuprofen. These medicines can thin your blood. Do not take these medicines unless your health care provider tells you to take them. Taking over-the-counter medicines, vitamins, herbs, and supplements. General instructions Ask your health care provider what steps will be taken to help prevent infection. These may include washing  skin with a germ-killing soap. Plan to have someone take you home from the hospital or clinic. What happens during the procedure?  An IV will be inserted into one of your veins. You may be given one or more of the following: A medicine to help you relax (sedative). A medicine to numb the area (local anesthetic). A medicine to  make you fall asleep (general anesthetic). This is rarely needed. You will lie on your side with your knees bent. The tube will: Have oil or gel put on it (be lubricated). Be inserted into your anus. Be gently eased through all parts of your large intestine. Air will be sent into your colon to keep it open. This may cause some pressure or cramping. Images will be taken with the camera and will appear on a screen. A small tissue sample may be removed to be looked at under a microscope (biopsy). The tissue may be sent to a lab for testing if any signs of problems are found. If small polyps are found, they may be removed and checked for cancer cells. When the procedure is finished, the tube will be removed. The procedure may vary among health care providers and hospitals. What happens after the procedure? Your blood pressure, heart rate, breathing rate, and blood oxygen level will be monitored until you leave the hospital or clinic. You may have a small amount of blood in your stool. You may pass gas and have mild cramping or bloating in your abdomen. This is caused by the air that was used to open your colon during the exam. Do not drive for 24 hours after the procedure. It is up to you to get the results of your procedure. Ask your health care provider, or the department that is doing the procedure, when your results will be ready. Summary A colonoscopy is a procedure to look at the entire large intestine. Follow instructions from your health care provider about eating and drinking before the procedure. If you were prescribed an oral bowel prep to clean out your colon, take it as told by your health care provider. During the colonoscopy, a flexible tube with a camera on its end is inserted into the anus and then passed into the other parts of the large intestine. This information is not intended to replace advice given to you by your health care provider. Make sure you discuss any questions you  have with your health care provider. Document Revised: 05/25/2019 Document Reviewed: 05/25/2019 Elsevier Patient Education  2022 [employer].

## 2022-02-24 NOTE — Progress Notes (Signed)
Subjective:   Cameron Sanchez is a 51 y.o. male who presents for Medicare Annual/Subsequent preventive examination.  Review of Systems    No ROS.  Medicare Wellness Virtual Visit.  Visual/audio telehealth visit, UTA vital signs.   See social history for additional risk factors.   Cardiac Risk Factors include: male gender;diabetes mellitus     Objective:    Today's Vitals   02/24/22 1133  Weight: 221 lb (100.2 kg)  Height: 5\' 7"  (1.702 m)   Body mass index is 34.61 kg/m.     02/24/2022   11:42 AM 02/18/2021    4:06 PM 01/21/2021   10:10 AM 11/30/2020    5:00 AM 06/17/2018    5:49 PM 01/30/2018   10:50 PM 07/01/2017    4:37 PM  Advanced Directives  Does Patient Have a Medical Advance Directive? No No No No No No No  Would patient like information on creating a medical advance directive? No - Patient declined No - Patient declined  No - Patient declined   No - Patient declined    Current Medications (verified) Outpatient Encounter Medications as of 02/24/2022  Medication Sig   allopurinol (ZYLOPRIM) 300 MG tablet TAKE 1 TABLET BY MOUTH EVERY DAY   atorvastatin (LIPITOR) 40 MG tablet TAKE 1 TABLET BY MOUTH EVERY DAY (Patient taking differently: Take 40 mg by mouth daily.)   azelastine (ASTELIN) 0.1 % nasal spray Place 2 sprays into both nostrils daily as needed for rhinitis. Use in each nostril as directed after nasal saline and flonase   fluticasone (FLONASE) 50 MCG/ACT nasal spray Place 2 sprays into both nostrils daily as needed for allergies or rhinitis. After nasal saline   metaxalone (SKELAXIN) 800 MG tablet Take 800 mg by mouth 3 (three) times daily.   metFORMIN (GLUCOPHAGE) 500 MG tablet TAKE 1 TABLET BY MOUTH 2 TIMES DAILY WITH A MEAL. (Patient not taking: Reported on 02/24/2022)   metoprolol succinate (TOPROL-XL) 50 MG 24 hr tablet TAKE 2 TABLETS BY MOUTH DAILY WITH OR IMMEDIATELY FOLLOWING A MEAL.   Oxycodone HCl 10 MG TABS Take 10 mg by mouth every 6 (six) hours as needed  (for pain).   pantoprazole (PROTONIX) 40 MG tablet TAKE 1 TABLET (40 MG TOTAL) BY MOUTH 2 (TWO) TIMES DAILY BEFORE A MEAL. (Patient not taking: Reported on 02/24/2022)   sodium chloride (OCEAN) 0.65 % SOLN nasal spray Place 2 sprays into both nostrils daily as needed for congestion.   triamterene-hydrochlorothiazide (MAXZIDE-25) 37.5-25 MG tablet TAKE 1 TABLET BY MOUTH EVERY DAY   No facility-administered encounter medications on file as of 02/24/2022.    Allergies (verified) Patient has no known allergies.   History: Past Medical History:  Diagnosis Date   Allergy    Chronic back pain    Hypercholesterolemia    Hypertension    Migraines    Past Surgical History:  Procedure Laterality Date   BACK SURGERY  08/12/14   multiple   CHOLECYSTECTOMY     KNEE ARTHROSCOPY     Dr HBK   Family History  Problem Relation Age of Onset   Cancer Father        germ cell (retroperitoneal)   Coronary artery disease Other        s/p stent (age 22)   Hypertension Paternal Grandfather    Kidney cancer Paternal Grandfather    Diabetes Paternal Grandfather    Diabetes Maternal Grandfather    Heart disease Cousin        MI -  age 15   Social History   Socioeconomic History   Marital status: Married    Spouse name: Not on file   Number of children: 2   Years of education: 12   Highest education level: Not on file  Occupational History   Occupation: disabled    Employer: [employer]  Tobacco Use   Smoking status: Never   Smokeless tobacco: Never  Vaping Use   Vaping Use: Never used  Substance and Sexual Activity   Alcohol use: No    Alcohol/week: 0.0 standard drinks   Drug use: No   Sexual activity: Not on file  Other Topics Concern   Not on file  Social History Narrative   Lives with girlfriend in a one story home.  Has 2 sons.  Trying to get disability due to several back issues.  Education: high school.    Social Determinants of Health   Financial Resource Strain:  Low Risk    Difficulty of Paying Living Expenses: Not hard at all  Food Insecurity: No Food Insecurity   Worried About Programme researcher, broadcasting/film/video in the Last Year: Never true   Ran Out of Food in the Last Year: Never true  Transportation Needs: No Transportation Needs   Lack of Transportation (Medical): No   Lack of Transportation (Non-Medical): No  Physical Activity: Not on file  Stress: No Stress Concern Present   Feeling of Stress : Not at all  Social Connections: Unknown   Frequency of Communication with Friends and Family: Not on file   Frequency of Social Gatherings with Friends and Family: Not on file   Attends Religious Services: Not on file   Active Member of Clubs or Organizations: Not on file   Attends Banker Meetings: Not on file   Marital Status: Married    Tobacco Counseling Counseling given: Not Answered   Clinical Intake:  Pre-visit preparation completed: Yes        Diabetes: Yes (Followed by PCP)  How often do you need to have someone help you when you read instructions, pamphlets, or other written materials from your doctor or pharmacy?: 1 - Never    Interpreter Needed?: No      Activities of Daily Living    02/24/2022   11:44 AM  In your present state of health, do you have any difficulty performing the following activities:  Hearing? 0  Vision? 0  Difficulty concentrating or making decisions? 0  Walking or climbing stairs? 1  Comment Cane in use when ambulating  Dressing or bathing? 0  Doing errands, shopping? 0  Preparing Food and eating ? N  Using the Toilet? N  In the past six months, have you accidently leaked urine? N  Do you have problems with loss of bowel control? N  Managing your Medications? Y  Comment Wife assist  Managing your Finances? Y  Comment Wife assist  Housekeeping or managing your Housekeeping? N    Patient Care Team: Dale Oldham, MD as PCP - General (Internal Medicine)  Indicate any recent Medical  Services you may have received from other than [facility] providers in the past year (date may be approximate).     Assessment:   This is a routine wellness examination for Cameron Sanchez.  Virtual Visit via Telephone Note  I connected with  Cameron Sanchez on 02/24/22 at 11:15 AM EDT by telephone and verified that I am speaking with the correct person using two identifiers.  Persons participating in the virtual  visit: patient/Nurse Health Advisor   I discussed the limitations of performing an evaluation and management service by telehealth.  The patient expressed understanding and agreed to proceed. We continued and completed visit with audio only. Some vital signs may be absent or patient reported.   Hearing/Vision screen Hearing Screening - Comments:: Patient is able to hear conversational tones without difficulty. No issues reported. Vision Screening - Comments:: Wears readers  No retinopathy reported They have seen their ophthalmologist in the last 12 months.    Dietary issues and exercise activities discussed:   Regular diet    Goals Addressed             This Visit's Progress    DIET - low carb diet/high fiber         Depression Screen    02/24/2022   11:42 AM 08/27/2021    7:32 AM 05/25/2021    9:17 AM 02/18/2021    2:10 PM 12/12/2020    7:33 AM 10/30/2019    8:29 AM 12/08/2016   12:01 PM  PHQ 2/9 Scores  PHQ - 2 Score 0 0 0 0 0 0 0    Fall Risk    02/24/2022   11:43 AM 05/25/2021    9:17 AM 02/18/2021    4:07 PM 02/11/2021    9:57 AM 12/12/2020    7:32 AM  Fall Risk   Falls in the past year? 0 0 0 0 0  Number falls in past yr: 0 0 0 0 0  Injury with Fall?  0 0 0 0  Follow up Falls evaluation completed Falls evaluation completed Falls evaluation completed Falls evaluation completed Falls evaluation completed    FALL RISK PREVENTION PERTAINING TO THE HOME: Home free of loose throw rugs in walkways, pet beds, electrical cords, etc? Yes  Adequate lighting in your home to  reduce risk of falls? Yes   ASSISTIVE DEVICES UTILIZED TO PREVENT FALLS: Life alert? No  Use of a cane, walker or w/c? Yes  TIMED UP AND GO: Was the test performed? No .   Cognitive Function: Patient is alert and oriented x3.         02/24/2022   11:47 AM 02/18/2021    4:08 PM  6CIT Screen  What Year? 0 points 0 points  What month? 0 points 0 points  What time? 0 points 0 points  Count back from 20  0 points  Months in reverse 0 points 0 points  Repeat phrase  0 points  Total Score  0 points    Immunizations Immunization History  Administered Date(s) Administered   Moderna Sars-Covid-2 Vaccination 04/15/2020, 05/14/2020   PFIZER(Purple Top)SARS-COV-2 Vaccination 07/16/2020, 08/12/2020   Tdap 11/29/2020   Shingrix Completed?: No.    Education has been provided regarding the importance of this vaccine. Patient has been advised to call insurance company to determine out of pocket expense if they have not yet received this vaccine. Advised may also receive vaccine at local pharmacy or Health Dept. Verbalized acceptance and understanding.  Screening Tests Health Maintenance  Topic Date Due   COLONOSCOPY (Pts 45-27yrs Insurance coverage will need to be confirmed)  Never done   HEMOGLOBIN A1C  11/25/2021   URINE MICROALBUMIN  02/26/2022 (Originally 01/13/2022)   COVID-19 Vaccine (5 - Booster) 03/12/2022 (Originally 10/07/2020)   OPHTHALMOLOGY EXAM  03/26/2022 (Originally 05/08/1979)   Zoster Vaccines- Shingrix (1 of 2) 05/26/2022 (Originally 05/07/1988)   FOOT EXAM  05/25/2022   INFLUENZA VACCINE  06/15/2022   TETANUS/TDAP  11/29/2030   Hepatitis C Screening  Completed   HIV Screening  Completed   HPV VACCINES  Aged Out   Health Maintenance Health Maintenance Due  Topic Date Due   COLONOSCOPY (Pts 45-15yrs Insurance coverage will need to be confirmed)  Never done   HEMOGLOBIN A1C  11/25/2021   Colonoscopy- due. Ordered per consent. Notes bright red blood seen at times  during BM. Reports stool as hard. Sporadically takes Miralax.  Possible hemorrhoidal. Denies pain. Increase water and fiber intake encouraged. Increase activity as tolerated.   Lung Cancer Screening: (Low Dose CT Chest recommended if Age 73-80 years, 30 pack-year currently smoking OR have quit w/in 15years.) does not qualify.   Vision Screening: Recommended annual ophthalmology exams for early detection of glaucoma and other disorders of the eye. Agrees to schedule diabetic eye exam.   Dental Screening: Recommended annual dental exams for proper oral hygiene  Community Resource Referral / Chronic Care Management: CRR required this visit?  No   CCM required this visit?  No      Plan:   Keep all routine maintenance appointments.   Colonoscopy- due. Ordered per consent. Notes bright red blood seen at times during BM. Reports stool as hard. Sporadically takes Miralax.  Possible hemorrhoidal. Denies pain. Increase water and fiber intake encouraged. Increase activity as tolerated. Next scheduled OV with PCP 02/26/22. Follow up as directed.   I have personally reviewed and noted the following in the patient's chart:   Medical and social history Use of alcohol, tobacco or illicit drugs  Current medications and supplements including opioid prescriptions. Patient is currently taking opioid prescriptions. Information provided to patient regarding non-opioid alternatives. Patient advised to discuss non-opioid treatment plan with their provider. Followed by [facility].  Functional ability and status Nutritional status Physical activity Advanced directives List of other physicians Hospitalizations, surgeries, and ER visits in previous 12 months Vitals Screenings to include cognitive, depression, and falls Referrals and appointments  In addition, I have reviewed and discussed with patient certain preventive protocols, quality metrics, and best practice recommendations. A written  personalized care plan for preventive services as well as general preventive health recommendations were provided to patient.     Ashok Pall, LPN   1/61/0960

## 2022-02-24 NOTE — Progress Notes (Signed)
Gastroenterology Pre-Procedure Review ? ?Request Date: 03/11/22 ?Requesting Physician: Dr. Vicente Males ? ?PATIENT REVIEW QUESTIONS: The patient responded to the following health history questions as indicated:   ? ?1. Are you having any GI issues?  Occasional constipation from pain medicine ?2. Do you have a personal history of Polyps? no ?3. Do you have a family history of Colon Cancer or Polyps? no ?4. Diabetes Mellitus? yes (type 2 diabetic) ?5. Joint replacements in the past 12 months?no ?6. Major health problems in the past 3 months?no ?7. Any artificial heart valves, MVP, or defibrillator?no ?   ?MEDICATIONS & ALLERGIES:    ?Patient reports the following regarding taking any anticoagulation/antiplatelet therapy:   ?Plavix, Coumadin, Eliquis, Xarelto, Lovenox, Pradaxa, Brilinta, or Effient? no ?Aspirin? no ? ?Patient confirms/reports the following medications:  ?Current Outpatient Medications  ?Medication Sig Dispense Refill  ? allopurinol (ZYLOPRIM) 300 MG tablet TAKE 1 TABLET BY MOUTH EVERY DAY 90 tablet 0  ? atorvastatin (LIPITOR) 40 MG tablet TAKE 1 TABLET BY MOUTH EVERY DAY (Patient taking differently: Take 40 mg by mouth daily.) 30 tablet 2  ? azelastine (ASTELIN) 0.1 % nasal spray Place 2 sprays into both nostrils daily as needed for rhinitis. Use in each nostril as directed after nasal saline and flonase 30 mL 2  ? fluticasone (FLONASE) 50 MCG/ACT nasal spray Place 2 sprays into both nostrils daily as needed for allergies or rhinitis. After nasal saline 16 g 2  ? metaxalone (SKELAXIN) 800 MG tablet Take 800 mg by mouth 3 (three) times daily.    ? metFORMIN (GLUCOPHAGE) 500 MG tablet TAKE 1 TABLET BY MOUTH 2 TIMES DAILY WITH A MEAL. (Patient not taking: Reported on 02/24/2022) 180 tablet 1  ? metoprolol succinate (TOPROL-XL) 50 MG 24 hr tablet TAKE 2 TABLETS BY MOUTH DAILY WITH OR IMMEDIATELY FOLLOWING A MEAL. 180 tablet 0  ? Oxycodone HCl 10 MG TABS Take 10 mg by mouth every 6 (six) hours as needed (for pain).     ? pantoprazole (PROTONIX) 40 MG tablet TAKE 1 TABLET (40 MG TOTAL) BY MOUTH 2 (TWO) TIMES DAILY BEFORE A MEAL. (Patient not taking: Reported on 02/24/2022) 180 tablet 1  ? sodium chloride (OCEAN) 0.65 % SOLN nasal spray Place 2 sprays into both nostrils daily as needed for congestion. 30 mL 2  ? triamterene-hydrochlorothiazide (MAXZIDE-25) 37.5-25 MG tablet TAKE 1 TABLET BY MOUTH EVERY DAY 90 tablet 0  ? ?No current facility-administered medications for this visit.  ? ? ?Patient confirms/reports the following allergies:  ?No Known Allergies ? ?No orders of the defined types were placed in this encounter. ? ? ?AUTHORIZATION INFORMATION ?Primary Insurance: ?1D#: ?Group #: ? ?Secondary Insurance: ?1D#: ?Group #: ? ?SCHEDULE INFORMATION: ?Date: 03/11/22 ?Time: ?Location: [facility] ?

## 2022-02-25 ENCOUNTER — Other Ambulatory Visit: Payer: Self-pay | Admitting: Internal Medicine

## 2022-02-26 ENCOUNTER — Ambulatory Visit (INDEPENDENT_AMBULATORY_CARE_PROVIDER_SITE_OTHER): Payer: Medicare Other | Admitting: Internal Medicine

## 2022-02-26 ENCOUNTER — Encounter: Payer: Self-pay | Admitting: Internal Medicine

## 2022-02-26 VITALS — BP 134/80 | HR 67 | Temp 98.6°F | Resp 20 | Ht 67.0 in | Wt 224.0 lb

## 2022-02-26 DIAGNOSIS — I10 Essential (primary) hypertension: Secondary | ICD-10-CM

## 2022-02-26 DIAGNOSIS — E1149 Type 2 diabetes mellitus with other diabetic neurological complication: Secondary | ICD-10-CM | POA: Diagnosis not present

## 2022-02-26 DIAGNOSIS — G8929 Other chronic pain: Secondary | ICD-10-CM | POA: Diagnosis not present

## 2022-02-26 DIAGNOSIS — K7460 Unspecified cirrhosis of liver: Secondary | ICD-10-CM

## 2022-02-26 DIAGNOSIS — K219 Gastro-esophageal reflux disease without esophagitis: Secondary | ICD-10-CM

## 2022-02-26 DIAGNOSIS — K5900 Constipation, unspecified: Secondary | ICD-10-CM

## 2022-02-26 DIAGNOSIS — D649 Anemia, unspecified: Secondary | ICD-10-CM

## 2022-02-26 DIAGNOSIS — I700 Atherosclerosis of aorta: Secondary | ICD-10-CM | POA: Diagnosis not present

## 2022-02-26 DIAGNOSIS — L918 Other hypertrophic disorders of the skin: Secondary | ICD-10-CM

## 2022-02-26 DIAGNOSIS — E7800 Pure hypercholesterolemia, unspecified: Secondary | ICD-10-CM

## 2022-02-26 DIAGNOSIS — G4730 Sleep apnea, unspecified: Secondary | ICD-10-CM | POA: Diagnosis not present

## 2022-02-26 DIAGNOSIS — M545 Low back pain, unspecified: Secondary | ICD-10-CM

## 2022-02-26 LAB — CBC WITH DIFFERENTIAL/PLATELET
Basophils Absolute: 0.1 10*3/uL (ref 0.0–0.1)
Basophils Relative: 0.7 % (ref 0.0–3.0)
Eosinophils Absolute: 0.2 10*3/uL (ref 0.0–0.7)
Eosinophils Relative: 2.2 % (ref 0.0–5.0)
HCT: 40.5 % (ref 39.0–52.0)
Hemoglobin: 13.6 g/dL (ref 13.0–17.0)
Lymphocytes Relative: 38.7 % (ref 12.0–46.0)
Lymphs Abs: 3 10*3/uL (ref 0.7–4.0)
MCHC: 33.6 g/dL (ref 30.0–36.0)
MCV: 87.5 fl (ref 78.0–100.0)
Monocytes Absolute: 0.8 10*3/uL (ref 0.1–1.0)
Monocytes Relative: 10 % (ref 3.0–12.0)
Neutro Abs: 3.8 10*3/uL (ref 1.4–7.7)
Neutrophils Relative %: 48.4 % (ref 43.0–77.0)
Platelets: 119 10*3/uL — ABNORMAL LOW (ref 150.0–400.0)
RBC: 4.62 Mil/uL (ref 4.22–5.81)
RDW: 14 % (ref 11.5–15.5)
WBC: 7.8 10*3/uL (ref 4.0–10.5)

## 2022-02-26 LAB — BASIC METABOLIC PANEL
BUN: 11 mg/dL (ref 6–23)
CO2: 26 mEq/L (ref 19–32)
Calcium: 9.4 mg/dL (ref 8.4–10.5)
Chloride: 100 mEq/L (ref 96–112)
Creatinine, Ser: 0.85 mg/dL (ref 0.40–1.50)
GFR: 99.7 mL/min (ref >60.00)
Glucose, Bld: 162 mg/dL — ABNORMAL HIGH (ref 70–99)
Potassium: 4 mEq/L (ref 3.5–5.1)
Sodium: 133 mEq/L — ABNORMAL LOW (ref 135–145)

## 2022-02-26 LAB — LIPID PANEL
Cholesterol: 122 mg/dL (ref 0–200)
HDL: 47.3 mg/dL (ref >39.00)
LDL Cholesterol: 56 mg/dL (ref 0–99)
NonHDL: 74.94
Total CHOL/HDL Ratio: 3
Triglycerides: 97 mg/dL (ref 0.0–149.0)
VLDL: 19.4 mg/dL (ref 0.0–40.0)

## 2022-02-26 LAB — HEPATIC FUNCTION PANEL
ALT: 35 U/L (ref 0–53)
AST: 47 U/L — ABNORMAL HIGH (ref 0–37)
Albumin: 4.4 g/dL (ref 3.5–5.2)
Alkaline Phosphatase: 80 U/L (ref 39–117)
Bilirubin, Direct: 0.3 mg/dL (ref 0.0–0.3)
Total Bilirubin: 1.3 mg/dL — ABNORMAL HIGH (ref 0.2–1.2)
Total Protein: 7 g/dL (ref 6.0–8.3)

## 2022-02-26 LAB — HEMOGLOBIN A1C: Hgb A1c MFr Bld: 8.6 % — ABNORMAL HIGH (ref 4.6–6.5)

## 2022-02-26 MED ORDER — PANTOPRAZOLE SODIUM 40 MG PO TBEC: 40.0000 mg | DELAYED_RELEASE_TABLET | Freq: Every day | ORAL | 3 refills | Status: DC

## 2022-02-26 MED ORDER — METFORMIN HCL 500 MG PO TABS: 500.0000 mg | ORAL_TABLET | Freq: Two times a day (BID) | ORAL | 0 refills | Status: DC

## 2022-02-26 NOTE — Patient Instructions (Signed)
Take protonix 30 minutes before breakfast ? ?Take pepcid '20mg'$  - 30 minutes before your evening meal.  ?

## 2022-02-26 NOTE — Progress Notes (Addendum)
[name], male   DOB: 01-19-1971, 51 y.o.   MRN: [id number] ? ? ?Subjective:  ? ? [name], male    DOB: 20-Nov-1970, 51 y.o.   MRN: [id number] ? ?This visit occurred during the SARS-CoV-2 public health emergency.  Safety protocols were in place, including screening questions prior to the visit, additional usage of staff PPE, and extensive cleaning of exam room while observing appropriate contact time as indicated for disinfecting solutions.  ? ?Patient here for a scheduled follow up.  ? ?Chief Complaint  ?Patient presents with  ? Follow-up  ?  6 mo follow up - pt c/o stomach discomfort recently. Has not been eating much. Stated his stomach is off due to oxycodone. Cannot afford the pantoprazole, has been using tums but no relief. Pt also stated has been constipated from oxycodone, using duralax but still difficult. Pt not taking metformin either.  ? .  ? ?HPI ?Increased acid reflux.  Not able pay for 90 day prescription.  Request 30 days.  Discussed taking protonix.  When taking protonix daily, still has breakthrough symptoms.  Worse if constipated.  Discussed bowel regimen.  Discussed elevated sugars.  Not taking metformin.  Has at home, but not taking.  Discussed starting.  No chest pain or sob reported.  No increased cough or congestion.  Has multiple skin tabs.  Request referral to dermatology. Also mentioned  - after visit - some intermittent rectal bleeding.  BRBPR.  Was questioning hemorrhoids.  Low carb diet and exercise.  ? ? ?Past Medical History:  ?Diagnosis Date  ? Allergy   ? Chronic back pain   ? Hypercholesterolemia   ? Hypertension   ? Migraines   ? ?Past Surgical History:  ?Procedure Laterality Date  ? BACK SURGERY  08/12/14  ? multiple  ? CHOLECYSTECTOMY    ? KNEE ARTHROSCOPY    ? Dr Joellyn Rued  ? ?Family History  ?Problem Relation Age of Onset  ? Cancer Father   ?     germ cell (retroperitoneal)  ? Coronary artery disease Other   ?     s/p stent (age 26)  ? Hypertension  Paternal Grandfather   ? Kidney cancer Paternal Grandfather   ? Diabetes Paternal Grandfather   ? Diabetes Maternal Grandfather   ? Heart disease Cousin   ?     MI - age 49  ? ?Social History  ? ?Socioeconomic History  ? Marital status: Married  ?  Spouse name: Not on file  ? Number of children: 2  ? Years of education: 35  ? Highest education level: Not on file  ?Occupational History  ? Occupation: disabled  ?  Employer: [employer]  ?Tobacco Use  ? Smoking status: Never  ? Smokeless tobacco: Never  ?Vaping Use  ? Vaping Use: Never used  ?Substance and Sexual Activity  ? Alcohol use: No  ?  Alcohol/week: 0.0 standard drinks  ? Drug use: No  ? Sexual activity: Not on file  ?Other Topics Concern  ? Not on file  ?Social History Narrative  ? Lives with girlfriend in a one story home.  Has 2 sons.  Trying to get disability due to several back issues.  Education: high school.   ? ?Social Determinants of Health  ? ?Financial Resource Strain: Low Risk   ? Difficulty of Paying Living Expenses: Not hard at all  ?Food Insecurity: No Food Insecurity  ? Worried About Charity fundraiser in the  Last Year: Never true  ? Ran Out of Food in the Last Year: Never true  ?Transportation Needs: No Transportation Needs  ? Lack of Transportation (Medical): No  ? Lack of Transportation (Non-Medical): No  ?Physical Activity: Not on file  ?Stress: No Stress Concern Present  ? Feeling of Stress : Not at all  ?Social Connections: Unknown  ? Frequency of Communication with Friends and Family: Not on file  ? Frequency of Social Gatherings with Friends and Family: Not on file  ? Attends Religious Services: Not on file  ? Active Member of Clubs or Organizations: Not on file  ? Attends Archivist Meetings: Not on file  ? Marital Status: Married  ? ? ? ?Review of Systems  ?Constitutional:  Negative for appetite change and unexpected weight change.  ?HENT:  Negative for congestion and sinus pressure.   ?Respiratory:  Negative for  cough, chest tightness and shortness of breath.   ?Cardiovascular:  Negative for chest pain, palpitations and leg swelling.  ?Gastrointestinal:  Positive for abdominal pain and constipation. Negative for nausea and vomiting.  ?Genitourinary:  Negative for difficulty urinating and dysuria.  ?Musculoskeletal:  Negative for joint swelling and myalgias.  ?Skin:  Negative for color change and rash.  ?Neurological:  Negative for dizziness, light-headedness and headaches.  ?Psychiatric/Behavioral:  Negative for agitation and dysphoric mood.   ? ?   ?Objective:  ?  ? ?BP 134/80 (BP Location: Left Arm, Patient Position: Sitting, Cuff Size: Large)   Pulse 67   Temp 98.6 ?F (37 ?C) (Temporal)   Resp 20   Ht '5\' 7"'$  (1.702 m)   Wt 224 lb (101.6 kg)   SpO2 98%   BMI 35.08 kg/m?  ?Wt Readings from Last 3 Encounters:  ?02/26/22 224 lb (101.6 kg)  ?02/24/22 221 lb (100.2 kg)  ?08/27/21 221 lb (100.2 kg)  ? ? ?Physical Exam ?Constitutional:   ?   General: He is not in acute distress. ?   Appearance: Normal appearance. He is well-developed.  ?HENT:  ?   Head: Normocephalic and atraumatic.  ?   Right Ear: External ear normal.  ?   Left Ear: External ear normal.  ?Eyes:  ?   General: No scleral icterus.    ?   Right eye: No discharge.     ?   Left eye: No discharge.  ?Cardiovascular:  ?   Rate and Rhythm: Normal rate and regular rhythm.  ?Pulmonary:  ?   Effort: Pulmonary effort is normal. No respiratory distress.  ?   Breath sounds: Normal breath sounds.  ?Abdominal:  ?   General: Bowel sounds are normal.  ?   Palpations: Abdomen is soft.  ?   Tenderness: There is no abdominal tenderness.  ?Musculoskeletal:     ?   General: No swelling or tenderness.  ?   Cervical back: Neck supple. No tenderness.  ?Lymphadenopathy:  ?   Cervical: No cervical adenopathy.  ?Skin: ?   Findings: No erythema or rash.  ?Neurological:  ?   Mental Status: He is alert.  ?Psychiatric:     ?   Mood and Affect: Mood normal.     ?   Behavior: Behavior  normal.  ? ? ? ?Outpatient Encounter Medications as of 02/26/2022  ?Medication Sig  ? allopurinol (ZYLOPRIM) 300 MG tablet TAKE 1 TABLET BY MOUTH EVERY DAY  ? atorvastatin (LIPITOR) 40 MG tablet TAKE 1 TABLET BY MOUTH EVERY DAY (Patient taking differently: Take 40 mg by mouth  daily.)  ? metaxalone (SKELAXIN) 800 MG tablet Take 800 mg by mouth 3 (three) times daily.  ? metFORMIN (GLUCOPHAGE) 500 MG tablet Take 1 tablet (500 mg total) by mouth 2 (two) times daily with a meal.  ? metoprolol succinate (TOPROL-XL) 50 MG 24 hr tablet TAKE 2 TABLETS BY MOUTH DAILY WITH OR IMMEDIATELY FOLLOWING A MEAL.  ? Oxycodone HCl 10 MG TABS Take 10 mg by mouth every 6 (six) hours as needed (for pain).  ? pantoprazole (PROTONIX) 40 MG tablet Take 1 tablet (40 mg total) by mouth daily.  ? triamterene-hydrochlorothiazide (MAXZIDE-25) 37.5-25 MG tablet TAKE 1 TABLET BY MOUTH EVERY DAY  ? [DISCONTINUED] azelastine (ASTELIN) 0.1 % nasal spray Place 2 sprays into both nostrils daily as needed for rhinitis. Use in each nostril as directed after nasal saline and flonase (Patient not taking: Reported on 02/26/2022)  ? [DISCONTINUED] fluticasone (FLONASE) 50 MCG/ACT nasal spray Place 2 sprays into both nostrils daily as needed for allergies or rhinitis. After nasal saline (Patient not taking: Reported on 02/26/2022)  ? [DISCONTINUED] metFORMIN (GLUCOPHAGE) 500 MG tablet TAKE 1 TABLET BY MOUTH 2 TIMES DAILY WITH A MEAL. (Patient not taking: Reported on 02/24/2022)  ? [DISCONTINUED] pantoprazole (PROTONIX) 40 MG tablet TAKE 1 TABLET (40 MG TOTAL) BY MOUTH 2 (TWO) TIMES DAILY BEFORE A MEAL. (Patient not taking: Reported on 02/24/2022)  ? [DISCONTINUED] sodium chloride (OCEAN) 0.65 % SOLN nasal spray Place 2 sprays into both nostrils daily as needed for congestion. (Patient not taking: Reported on 02/26/2022)  ? ?No facility-administered encounter medications on file as of 02/26/2022.  ?  ? ?Lab Results  ?Component Value Date  ? WBC 7.8 02/26/2022  ? HGB  13.6 02/26/2022  ? HCT 40.5 02/26/2022  ? PLT 119.0 (L) 02/26/2022  ? GLUCOSE 162 (H) 02/26/2022  ? CHOL 122 02/26/2022  ? TRIG 97.0 02/26/2022  ? HDL 47.30 02/26/2022  ? LDLDIRECT 142.0 06/21/2019  ? [place] 56 04/14

## 2022-02-28 ENCOUNTER — Other Ambulatory Visit: Payer: Self-pay | Admitting: Internal Medicine

## 2022-02-28 DIAGNOSIS — E871 Hypo-osmolality and hyponatremia: Secondary | ICD-10-CM

## 2022-02-28 DIAGNOSIS — K7460 Unspecified cirrhosis of liver: Secondary | ICD-10-CM

## 2022-02-28 DIAGNOSIS — I10 Essential (primary) hypertension: Secondary | ICD-10-CM

## 2022-02-28 DIAGNOSIS — L918 Other hypertrophic disorders of the skin: Secondary | ICD-10-CM | POA: Insufficient documentation

## 2022-02-28 DIAGNOSIS — D696 Thrombocytopenia, unspecified: Secondary | ICD-10-CM

## 2022-02-28 NOTE — Assessment & Plan Note (Addendum)
Increased acid reflux.  Has breakthrough symptoms despite PPI.  Restart protonix '40mg'$  q day.  Add pepcid '20mg'$ .  Discuss with GI regarding question of need for EGD.  Schedule for colonoscopy.  See if can arrange for EGD at that time.  ?

## 2022-02-28 NOTE — Progress Notes (Signed)
Orders placed for f/u labs.  

## 2022-02-28 NOTE — Assessment & Plan Note (Signed)
Blood pressure doing well.  Continue triam/hctz.  Follow pressures.  Follow metabolic panel.  

## 2022-02-28 NOTE — Assessment & Plan Note (Addendum)
Noted on recent CT.   Follow liver function tests.  Follow up with GI.   ?

## 2022-02-28 NOTE — Assessment & Plan Note (Signed)
On lipitor.  Low cholesterol diet and exercise.  Follow lipid panel and liver function tests.   

## 2022-02-28 NOTE — Assessment & Plan Note (Signed)
Will start metformin.  Discussed low carb diet and exercise.  Follow sugars.   ?

## 2022-02-28 NOTE — Assessment & Plan Note (Signed)
Continue lipitor  ?

## 2022-02-28 NOTE — Assessment & Plan Note (Addendum)
Takes narcotic pain medication. Discussed bowel regimen - to keep bowels moving.  Follow.   Needs colonoscopy. Scheduled.  Also intermittent rectal bleeding as outlined.  Check cbc.   ?

## 2022-02-28 NOTE — Assessment & Plan Note (Signed)
Discussed the need to treat sleep apnea.  Has cpap.  Discussed the need to use.  Follow.  ?

## 2022-02-28 NOTE — Assessment & Plan Note (Signed)
Followed by pain clinic.  On oxycodone.  Discussed the need to keep bowel moving.  Discussed bowel regimen.   

## 2022-02-28 NOTE — Assessment & Plan Note (Signed)
Check cbc 

## 2022-02-28 NOTE — Assessment & Plan Note (Signed)
Request referral to dermatology for removal.  ?

## 2022-03-04 ENCOUNTER — Telehealth: Payer: Self-pay | Admitting: Internal Medicine

## 2022-03-04 NOTE — Telephone Encounter (Signed)
-----   Message from [name], MD sent at 02/28/2022  9:45 AM EDT ----- ?Regarding: RE: EGD? ?Maritza or Earlimart please add egd to scheduled colonoscopy please. ? ?Regards  ?Kiran ?----- Message ----- ?From: Einar Pheasant, MD ?Sent: 02/28/2022   9:44 AM EDT ?To: [name], MD ?Subject: EGD?                                          ? ?Mr Mcknight is scheduled for colonoscopy 03/12/22.  He is having intermittent rectal bleeding.  I also wanted you to know he is having persistent acid reflux despite PPI therapy.  I did not know if he could be scheduled for an EGD at the same time as his colonoscopy (if you feel appropriate).  Let me know if I need to do anything.   ? ?Thank you for seeing him ?Deric Bocock ? ?

## 2022-03-05 ENCOUNTER — Other Ambulatory Visit: Payer: Self-pay | Admitting: Internal Medicine

## 2022-03-06 ENCOUNTER — Other Ambulatory Visit: Payer: Self-pay | Admitting: Internal Medicine

## 2022-03-09 DIAGNOSIS — M545 Low back pain, unspecified: Secondary | ICD-10-CM | POA: Diagnosis not present

## 2022-03-09 DIAGNOSIS — G894 Chronic pain syndrome: Secondary | ICD-10-CM | POA: Diagnosis not present

## 2022-03-09 DIAGNOSIS — M961 Postlaminectomy syndrome, not elsewhere classified: Secondary | ICD-10-CM | POA: Diagnosis not present

## 2022-03-09 DIAGNOSIS — M5137 Other intervertebral disc degeneration, lumbosacral region: Secondary | ICD-10-CM | POA: Diagnosis not present

## 2022-03-09 DIAGNOSIS — Z79891 Long term (current) use of opiate analgesic: Secondary | ICD-10-CM | POA: Diagnosis not present

## 2022-03-10 ENCOUNTER — Encounter: Payer: Self-pay | Admitting: Gastroenterology

## 2022-03-11 ENCOUNTER — Encounter: Payer: Self-pay | Admitting: Gastroenterology

## 2022-03-11 ENCOUNTER — Encounter
Admission: RE | Disposition: A | Discharge status: Home / Self Care | Payer: Self-pay | Source: Home / Self Care | Attending: Gastroenterology

## 2022-03-11 ENCOUNTER — Ambulatory Visit
Admission: RE | Admit: 2022-03-11 | Discharge: 2022-03-11 | Disposition: A | Discharge status: Home / Self Care | Payer: Medicare Other | Attending: Gastroenterology | Admitting: Gastroenterology

## 2022-03-11 ENCOUNTER — Ambulatory Visit: Payer: Medicare Other | Admitting: Anesthesiology

## 2022-03-11 DIAGNOSIS — G4730 Sleep apnea, unspecified: Secondary | ICD-10-CM | POA: Diagnosis not present

## 2022-03-11 DIAGNOSIS — Z1211 Encounter for screening for malignant neoplasm of colon: Secondary | ICD-10-CM | POA: Diagnosis not present

## 2022-03-11 DIAGNOSIS — Z79899 Other long term (current) drug therapy: Secondary | ICD-10-CM | POA: Diagnosis not present

## 2022-03-11 DIAGNOSIS — D122 Benign neoplasm of ascending colon: Secondary | ICD-10-CM | POA: Diagnosis not present

## 2022-03-11 DIAGNOSIS — K635 Polyp of colon: Secondary | ICD-10-CM

## 2022-03-11 DIAGNOSIS — K317 Polyp of stomach and duodenum: Secondary | ICD-10-CM | POA: Insufficient documentation

## 2022-03-11 DIAGNOSIS — D120 Benign neoplasm of cecum: Secondary | ICD-10-CM | POA: Diagnosis not present

## 2022-03-11 DIAGNOSIS — I10 Essential (primary) hypertension: Secondary | ICD-10-CM | POA: Insufficient documentation

## 2022-03-11 DIAGNOSIS — E119 Type 2 diabetes mellitus without complications: Secondary | ICD-10-CM | POA: Diagnosis not present

## 2022-03-11 DIAGNOSIS — K219 Gastro-esophageal reflux disease without esophagitis: Secondary | ICD-10-CM | POA: Insufficient documentation

## 2022-03-11 DIAGNOSIS — E7800 Pure hypercholesterolemia, unspecified: Secondary | ICD-10-CM | POA: Insufficient documentation

## 2022-03-11 DIAGNOSIS — Z7984 Long term (current) use of oral hypoglycemic drugs: Secondary | ICD-10-CM | POA: Diagnosis not present

## 2022-03-11 HISTORY — PX: ESOPHAGOGASTRODUODENOSCOPY: SHX5428

## 2022-03-11 HISTORY — PX: COLONOSCOPY WITH PROPOFOL: SHX5780

## 2022-03-11 HISTORY — DX: Type 2 diabetes mellitus without complications: E11.9

## 2022-03-11 LAB — GLUCOSE, CAPILLARY: Glucose-Capillary: 181 mg/dL — ABNORMAL HIGH (ref 70–99)

## 2022-03-11 SURGERY — COLONOSCOPY WITH PROPOFOL
Anesthesia: General

## 2022-03-11 MED ORDER — PROPOFOL 500 MG/50ML IV EMUL
INTRAVENOUS | Status: DC | PRN
  Administered 2022-03-11: 150 ug/kg/min via INTRAVENOUS

## 2022-03-11 MED ORDER — LIDOCAINE HCL (CARDIAC) PF 100 MG/5ML IV SOSY
PREFILLED_SYRINGE | INTRAVENOUS | Status: DC | PRN
  Administered 2022-03-11: 100 mg via INTRAVENOUS

## 2022-03-11 MED ORDER — PROPOFOL 10 MG/ML IV BOLUS
INTRAVENOUS | Status: AC
  Filled 2022-03-11: qty 20

## 2022-03-11 MED ORDER — SODIUM CHLORIDE 0.9 % IV SOLN: INTRAVENOUS | Status: DC

## 2022-03-11 MED ORDER — LIDOCAINE HCL (PF) 2 % IJ SOLN
INTRAMUSCULAR | Status: AC
  Filled 2022-03-11: qty 5

## 2022-03-11 MED ORDER — STERILE WATER FOR IRRIGATION IR SOLN
Status: DC | PRN
  Administered 2022-03-11: 60 mL

## 2022-03-11 MED ORDER — PROPOFOL 500 MG/50ML IV EMUL
INTRAVENOUS | Status: AC
  Filled 2022-03-11: qty 50

## 2022-03-11 NOTE — Anesthesia Preprocedure Evaluation (Signed)
Anesthesia Evaluation  ?Patient identified by MRN, date of birth, ID band ?Patient awake ? ? ? ?Reviewed: ?Allergy & Precautions, NPO status , Patient's Chart, lab work & pertinent test results ? ?Airway ?Mallampati: II ? ?TM Distance: >3 FB ?Neck ROM: Full ? ? ? Dental ? ?(+) Teeth Intact ?  ?Pulmonary ?neg pulmonary ROS, sleep apnea ,  ?  ?Pulmonary exam normal ?breath sounds clear to auscultation ? ? ? ? ? ? Cardiovascular ?Exercise Tolerance: Good ?hypertension, Pt. on medications ?negative cardio ROS ?Normal cardiovascular exam ?Rhythm:Regular  ? ?  ?Neuro/Psych ? Headaches, negative neurological ROS ? negative psych ROS  ? GI/Hepatic ?negative GI ROS, Neg liver ROS, GERD  Medicated,  ?Endo/Other  ?negative endocrine ROSdiabetes, Well Controlled, Type 2, Oral Hypoglycemic Agents ? Renal/GU ?negative Renal ROS  ?negative genitourinary ?  ?Musculoskeletal ? ? Abdominal ?(+) + obese,   ?Peds ?negative pediatric ROS ?(+)  Hematology ?negative hematology ROS ?(+) Blood dyscrasia, anemia ,   ?Anesthesia Other Findings ?Past Medical History: ?No date: Allergy ?No date: Chronic back pain ?No date: Diabetes mellitus without complication ([place]) ?No date: Hypercholesterolemia ?No date: Hypertension ?No date: Migraines ? ?Past Surgical History: ?08/12/14: BACK SURGERY ?    Comment:  multiple ?No date: CHOLECYSTECTOMY ?No date: KNEE ARTHROSCOPY ?    Comment:  Dr Joellyn Rued ? ?BMI   ? Body Mass Index: 34.61 kg/m?  ?  ? ? Reproductive/Obstetrics ?negative OB ROS ? ?  ? ? ? ? ? ? ? ? ? ? ? ? ? ?  ?  ? ? ? ? ? ? ? ? ?Anesthesia Physical ?Anesthesia Plan ? ?ASA: 3 ? ?Anesthesia Plan: General  ? ?Post-op Pain Management:   ? ?Induction: Intravenous ? ?PONV Risk Score and Plan: Propofol infusion and TIVA ? ?Airway Management Planned: Natural Airway and Nasal Cannula ? ?Additional Equipment:  ? ?Intra-op Plan:  ? ?Post-operative Plan:  ? ?Informed Consent: I have reviewed the patients History and Physical,  chart, labs and discussed the procedure including the risks, benefits and alternatives for the proposed anesthesia with the patient or authorized representative who has indicated his/her understanding and acceptance.  ? ? ? ?Dental Advisory Given ? ?Plan Discussed with: CRNA and Surgeon ? ?Anesthesia Plan Comments:   ? ? ? ? ? ? ?Anesthesia Quick Evaluation ? ?

## 2022-03-11 NOTE — Transfer of Care (Signed)
Immediate Anesthesia Transfer of Care Note ? ?Patient: Cameron Sanchez ? ?Procedure(s) Performed: COLONOSCOPY WITH PROPOFOL ?ESOPHAGOGASTRODUODENOSCOPY (EGD) ? ?Patient Location: PACU ? ?Anesthesia Type:General ? ?Level of Consciousness: awake and sedated ? ?Airway & Oxygen Therapy: Patient Spontanous Breathing and Patient connected to nasal cannula oxygen ? ?Post-op Assessment: Report given to RN and Post -op Vital signs reviewed and stable ? ?Post vital signs: Reviewed and stable ? ?Last Vitals:  ?Vitals Value Taken Time  ?BP    ?Temp    ?Pulse    ?Resp    ?SpO2    ? ? ?Last Pain:  ?Vitals:  ? 03/11/22 0811  ?TempSrc: Temporal  ?PainSc: 0-No pain  ?   ? ?  ? ?Complications: No notable events documented. ?

## 2022-03-11 NOTE — Op Note (Signed)
[facility] ?Gastroenterology ?Patient Name: Cameron Sanchez ?Procedure Date: 03/11/2022 8:46 AM ?MRN: [id number] ?Account #: [id number] ?Date of Birth: 1970/12/22 ?Admit Type: Outpatient ?Age: 51 ?Room: [facility] ENDO ROOM 3 ?Gender: Male ?Note Status: Finalized ?Instrument Name: Upper Endoscope [id number] ?Procedure:             Upper GI endoscopy ?Indications:           Heartburn ?Providers:             [name] MD, MD ?Referring MD:          Einar Pheasant, MD (Referring MD) ?Medicines:             Monitored Anesthesia Care ?Complications:         No immediate complications. ?Procedure:             Pre-Anesthesia Assessment: ?                       - Prior to the procedure, a History and Physical was  ?                       performed, and patient medications, allergies and  ?                       sensitivities were reviewed. The patient's tolerance  ?                       of previous anesthesia was reviewed. ?                       - The risks and benefits of the procedure and the  ?                       sedation options and risks were discussed with the  ?                       patient. All questions were answered and informed  ?                       consent was obtained. ?                       - ASA Grade Assessment: II - A patient with mild  ?                       systemic disease. ?                       After obtaining informed consent, the endoscope was  ?                       passed under direct vision. Throughout the procedure,  ?                       the patient's blood pressure, pulse, and oxygen  ?                       saturations were monitored continuously. The Endoscope  ?                       was introduced through  the mouth, and advanced to the  ?                       third part of duodenum. The upper GI endoscopy was  ?                       accomplished with ease. The patient tolerated the  ?                       procedure well. ?Findings: ?     The esophagus was normal. ?      The examined duodenum was normal. ?     Multiple medium sessile polyps with no bleeding and no stigmata of  ?     recent bleeding were found on the greater curvature of the gastric  ?     antrum. Biopsies were taken with a cold forceps for histology. ?     The cardia and gastric fundus were normal on retroflexion. ?Impression:            - Normal esophagus. ?                       - Normal examined duodenum. ?                       - Multiple gastric polyps. Biopsied. ?Recommendation:        - Await pathology results. ?                       - Perform a colonoscopy today. ?Procedure Code(s):     --- Professional --- ?                       [phone], Esophagogastroduodenoscopy, flexible,  ?                       transoral; with biopsy, single or multiple ?Diagnosis Code(s):     --- Professional --- ?                       K31.7, Polyp of stomach and duodenum ?                       R12, Heartburn ?CPT copyright 2019 American Medical Association. All rights reserved. ?The codes documented in this report are preliminary and upon coder review may  ?be revised to meet current compliance requirements. ?[name], MD ?[name] MD, MD ?03/11/2022 8:56:35 AM ?This report has been signed electronically. ?Number of Addenda: 0 ?Note Initiated On: 03/11/2022 8:46 AM ?Estimated Blood Loss:  Estimated blood loss: none. ?     [facility] ?

## 2022-03-11 NOTE — Anesthesia Postprocedure Evaluation (Signed)
Anesthesia Post Note ? ?Patient: Cameron Sanchez ? ?Procedure(s) Performed: COLONOSCOPY WITH PROPOFOL ?ESOPHAGOGASTRODUODENOSCOPY (EGD) ? ?Patient location during evaluation: PACU ?Anesthesia Type: General ?Level of consciousness: awake and oriented ?Pain management: pain level controlled ?Vital Signs Assessment: post-procedure vital signs reviewed and stable ?Respiratory status: spontaneous breathing and respiratory function stable ?Cardiovascular status: stable ?Anesthetic complications: no ? ? ?No notable events documented. ? ? ?Last Vitals:  ?Vitals:  ? 03/11/22 0932 03/11/22 0942  ?BP: 129/78 (!) 144/81  ?Pulse: 70 72  ?Resp: 13 12  ?Temp:    ?SpO2: 100% 98%  ?  ?Last Pain:  ?Vitals:  ? 03/11/22 0942  ?TempSrc:   ?PainSc: 0-No pain  ? ? ?  ?  ?  ?  ?  ?  ? ?VAN STAVEREN,Trevor Wilkie ? ? ? ? ?

## 2022-03-11 NOTE — Anesthesia Procedure Notes (Signed)
Date/Time: 03/11/2022 8:54 AM ?Performed by: Vaughan Sine ?Pre-anesthesia Checklist: Patient identified, Emergency Drugs available, Suction available, Patient being monitored and Timeout performed ?Patient Re-evaluated:Patient Re-evaluated prior to induction ?Oxygen Delivery Method: Supernova nasal CPAP ?Preoxygenation: Pre-oxygenation with 100% oxygen ?Induction Type: IV induction ? ? ? ? ?

## 2022-03-11 NOTE — H&P (Signed)
? ? ? ?Cameron Bellows, MD ?[address] [place], [place], [place], [address] ?[address] [place], [place], [place], [address] ?Phone: [phone]  ?Fax: [phone] ? ?Primary Care Physician:  Einar Pheasant, MD ? ? ?Pre-Procedure History & Physical: ?HPI:  Cameron Sanchez is a 51 y.o. male is here for an endoscopy and colonoscopy  ?  ?Past Medical History:  ?Diagnosis Date  ? Allergy   ? Chronic back pain   ? Diabetes mellitus without complication ([place])   ? Hypercholesterolemia   ? Hypertension   ? Migraines   ? ? ?Past Surgical History:  ?Procedure Laterality Date  ? BACK SURGERY  08/12/14  ? multiple  ? CHOLECYSTECTOMY    ? KNEE ARTHROSCOPY    ? Dr HBK  ? ? ?Prior to Admission medications   ?Medication Sig Start Date End Date Taking? Authorizing Provider  ?allopurinol (ZYLOPRIM) 300 MG tablet TAKE 1 TABLET BY MOUTH EVERY DAY 02/26/22  Yes Einar Pheasant, MD  ?metaxalone (SKELAXIN) 800 MG tablet Take 800 mg by mouth 3 (three) times daily.   Yes [provider]  ?metFORMIN (GLUCOPHAGE) 500 MG tablet Take 1 tablet (500 mg total) by mouth 2 (two) times daily with a meal. 02/26/22  Yes Einar Pheasant, MD  ?metoprolol succinate (TOPROL-XL) 50 MG 24 hr tablet TAKE 2 TABLETS BY MOUTH DAILY WITH OR IMMEDIATELY FOLLOWING A MEAL. 03/05/22  Yes Einar Pheasant, MD  ?Oxycodone HCl 10 MG TABS Take 10 mg by mouth every 6 (six) hours as needed (for pain). 05/27/19  Yes [provider]  ?pantoprazole (PROTONIX) 40 MG tablet Take 1 tablet (40 mg total) by mouth daily. 02/26/22  Yes Einar Pheasant, MD  ?triamterene-hydrochlorothiazide (MAXZIDE-25) 37.5-25 MG tablet TAKE 1 TABLET BY MOUTH EVERY DAY 03/09/22  Yes Einar Pheasant, MD  ?atorvastatin (LIPITOR) 40 MG tablet TAKE 1 TABLET BY MOUTH EVERY DAY ?Patient taking differently: Take 40 mg by mouth daily. 01/18/19   Einar Pheasant, MD  ? ? ?Allergies as of 02/24/2022  ? (No Known Allergies)  ? ? ?Family History  ?Problem Relation Age of Onset  ? Cancer Father   ?      germ cell (retroperitoneal)  ? Coronary artery disease Other   ?     s/p stent (age 81)  ? Hypertension Paternal Grandfather   ? Kidney cancer Paternal Grandfather   ? Diabetes Paternal Grandfather   ? Diabetes Maternal Grandfather   ? Heart disease Cousin   ?     MI - age 93  ? ? ?Social History  ? ?Socioeconomic History  ? Marital status: Married  ?  Spouse name: Not on file  ? Number of children: 2  ? Years of education: 72  ? Highest education level: Not on file  ?Occupational History  ? Occupation: disabled  ?  Employer: [employer]  ?Tobacco Use  ? Smoking status: Never  ? Smokeless tobacco: Never  ?Vaping Use  ? Vaping Use: Never used  ?Substance and Sexual Activity  ? Alcohol use: No  ?  Alcohol/week: 0.0 standard drinks  ? Drug use: No  ? Sexual activity: Not on file  ?Other Topics Concern  ? Not on file  ?Social History Narrative  ? Lives with girlfriend in a one story home.  Has 2 sons.  Trying to get disability due to several back issues.  Education: high school.   ? ?Social Determinants of Health  ? ?Financial Resource Strain: Low Risk   ? Difficulty of  Paying Living Expenses: Not hard at all  ?Food Insecurity: No Food Insecurity  ? Worried About Charity fundraiser in the Last Year: Never true  ? Ran Out of Food in the Last Year: Never true  ?Transportation Needs: No Transportation Needs  ? Lack of Transportation (Medical): No  ? Lack of Transportation (Non-Medical): No  ?Physical Activity: Not on file  ?Stress: No Stress Concern Present  ? Feeling of Stress : Not at all  ?Social Connections: Unknown  ? Frequency of Communication with Friends and Family: Not on file  ? Frequency of Social Gatherings with Friends and Family: Not on file  ? Attends Religious Services: Not on file  ? Active Member of Clubs or Organizations: Not on file  ? Attends Archivist Meetings: Not on file  ? Marital Status: Married  ?Intimate Partner Violence: Not At Risk  ? Fear of Current or Ex-Partner: No   ? Emotionally Abused: No  ? Physically Abused: No  ? Sexually Abused: No  ? ? ?Review of Systems: ?See HPI, otherwise negative ROS ? ?Physical Exam: ?BP (!) 175/93   Pulse 75   Temp (!) 97.5 ?F (36.4 ?C) (Temporal)   Resp 18   Ht '5\' 7"'$  (1.702 m)   Wt 100.2 kg   SpO2 100%   BMI 34.61 kg/m?  ?General:   Alert,  pleasant and cooperative in NAD ?Head:  Normocephalic and atraumatic. ?Neck:  Supple; no masses or thyromegaly. ?Lungs:  Clear throughout to auscultation, normal respiratory effort.    ?Heart:  +S1, +S2, Regular rate and rhythm, No edema. ?Abdomen:  Soft, nontender and nondistended. Normal bowel sounds, without guarding, and without rebound.   ?Neurologic:  Alert and  oriented x4;  grossly normal neurologically. ? ?Impression/Plan: ?Cameron Sanchez is here for an endoscopy and colonoscopy  to be performed for  evaluation of colon cancer screening and reflux ?   ?Risks, benefits, limitations, and alternatives regarding endoscopy have been reviewed with the patient.  Questions have been answered.  All parties agreeable. ? ? ?Cameron Bellows, MD  03/11/2022, 8:37 AM ? ?

## 2022-03-11 NOTE — Op Note (Signed)
[facility] ?Gastroenterology ?Patient Name: Cameron Sanchez ?Procedure Date: 03/11/2022 8:46 AM ?MRN: [id number] ?Account #: [id number] ?Date of Birth: 24-Dec-1970 ?Admit Type: Outpatient ?Age: 51 ?Room: [facility] ENDO ROOM 3 ?Gender: Male ?Note Status: Finalized ?Instrument Name: Colonscope [id number] ?Procedure:             Colonoscopy ?Indications:           Screening for colorectal malignant neoplasm ?Providers:             [name] MD, MD ?Referring MD:          Einar Pheasant, MD (Referring MD) ?Medicines:             Monitored Anesthesia Care ?Complications:         No immediate complications. ?Procedure:             Pre-Anesthesia Assessment: ?                       - Prior to the procedure, a History and Physical was  ?                       performed, and patient medications, allergies and  ?                       sensitivities were reviewed. The patient's tolerance  ?                       of previous anesthesia was reviewed. ?                       - The risks and benefits of the procedure and the  ?                       sedation options and risks were discussed with the  ?                       patient. All questions were answered and informed  ?                       consent was obtained. ?                       - The risks and benefits of the procedure and the  ?                       sedation options and risks were discussed with the  ?                       patient. All questions were answered and informed  ?                       consent was obtained. ?                       - ASA Grade Assessment: II - A patient with mild  ?                       systemic disease. ?                       After obtaining  informed consent, the colonoscope was  ?                       passed under direct vision. Throughout the procedure,  ?                       the patient's blood pressure, pulse, and oxygen  ?                       saturations were monitored continuously. The  ?                        Colonoscope was introduced through the anus and  ?                       advanced to the the cecum, identified by the  ?                       appendiceal orifice. The colonoscopy was performed  ?                       with ease. The patient tolerated the procedure well.  ?                       The quality of the bowel preparation was excellent. ?Findings: ?     The perianal and digital rectal examinations were normal. ?     Four sessile polyps were found in the cecum. The polyps were 2 to 3 mm  ?     in size. These polyps were removed with a cold biopsy forceps. Resection  ?     and retrieval were complete. ?     Two sessile polyps were found in the cecum. The polyps were 4 to 6 mm in  ?     size. These polyps were removed with a cold snare. Resection and  ?     retrieval were complete. ?     Three sessile polyps were found in the ascending colon. The polyps were  ?     5 to 7 mm in size. These polyps were removed with a cold snare.  ?     Resection and retrieval were complete. ?     A 15 mm polyp was found in the ascending colon. The polyp was  ?     semi-sessile. Preparations were made for mucosal resection. Saline was  ?     injected to raise the lesion. Snare mucosal resection was performed.  ?     Resection and retrieval were complete. To close a defect after mucosal  ?     resection, one hemostatic clip was successfully placed. There was no  ?     bleeding at the end of the procedure. Border of polyp marked using blue  ?     or nbi light ?     The exam was otherwise without abnormality on direct and retroflexion  ?     views. ?Impression:            - Four 2 to 3 mm polyps in the cecum, removed with a  ?                       cold biopsy forceps. Resected and retrieved. ?                       -  Two 4 to 6 mm polyps in the cecum, removed with a  ?                       cold snare. Resected and retrieved. ?                       - Three 5 to 7 mm polyps in the ascending colon,  ?                       removed  with a cold snare. Resected and retrieved. ?                       - One 15 mm polyp in the ascending colon, removed with  ?                       mucosal resection. Resected and retrieved. Clip was  ?                       placed. ?                       - The examination was otherwise normal on direct and  ?                       retroflexion views. ?                       - Mucosal resection was performed. Resection and  ?                       retrieval were complete. ?Recommendation:        - Discharge patient to home (with escort). ?                       - Resume previous diet. ?                       - Continue present medications. ?                       - Await pathology results. ?                       - Repeat colonoscopy for surveillance based on  ?                       pathology results. ?Procedure Code(s):     --- Professional --- ?                       [phone], Colonoscopy, flexible; with endoscopic mucosal  ?                       resection ?                       85885, 59, Colonoscopy, flexible; with removal of  ?                       tumor(s), polyp(s), or other lesion(s) by snare  ?  technique ?                       45380, 59, Colonoscopy, flexible; with biopsy, single  ?                       or multiple ?Diagnosis Code(s):     --- Professional --- ?                       K63.5, Polyp of colon ?                       Z12.11, Encounter for screening for malignant neoplasm  ?                       of colon ?CPT copyright 2019 American Medical Association. All rights reserved. ?The codes documented in this report are preliminary and upon coder review may  ?be revised to meet current compliance requirements. ?[name], MD ?[name] MD, MD ?03/11/2022 9:21:50 AM ?This report has been signed electronically. ?Number of Addenda: 0 ?Note Initiated On: 03/11/2022 8:46 AM ?Scope Withdrawal Time: 0 hours 18 minutes 58 seconds  ?Total Procedure Duration: 0 hours 20 minutes 23  seconds  ?Estimated Blood Loss:  Estimated blood loss: none. ?     [facility] ?

## 2022-03-12 ENCOUNTER — Encounter: Payer: Self-pay | Admitting: Gastroenterology

## 2022-03-12 LAB — SURGICAL PATHOLOGY

## 2022-03-12 NOTE — Progress Notes (Signed)
Inform multiple polyps mostly pre cancerous- repeat colonoscopy in 6-8 months to ensure no polyp seen at polypectomy site

## 2022-03-14 ENCOUNTER — Encounter: Payer: Self-pay | Admitting: Internal Medicine

## 2022-03-14 DIAGNOSIS — Z0000 Encounter for general adult medical examination without abnormal findings: Secondary | ICD-10-CM | POA: Insufficient documentation

## 2022-03-15 ENCOUNTER — Other Ambulatory Visit: Payer: Self-pay

## 2022-03-15 ENCOUNTER — Telehealth: Payer: Self-pay

## 2022-03-15 NOTE — Telephone Encounter (Signed)
-----   Message from [name], MD sent at 03/12/2022  1:55 PM EDT ----- ?Inform multiple polyps mostly pre cancerous- repeat colonoscopy in 6-8 months to ensure no polyp seen at polypectomy site ?

## 2022-03-15 NOTE — Telephone Encounter (Signed)
Called patient to notify him what Dr. Vicente Males recommended for him to have a repeat colonoscopy in 6-8 months to make sure he doesn't have polyps. ?

## 2022-04-13 DIAGNOSIS — M961 Postlaminectomy syndrome, not elsewhere classified: Secondary | ICD-10-CM | POA: Diagnosis not present

## 2022-04-13 DIAGNOSIS — M545 Low back pain, unspecified: Secondary | ICD-10-CM | POA: Diagnosis not present

## 2022-04-13 DIAGNOSIS — Z79891 Long term (current) use of opiate analgesic: Secondary | ICD-10-CM | POA: Diagnosis not present

## 2022-04-13 DIAGNOSIS — G894 Chronic pain syndrome: Secondary | ICD-10-CM | POA: Diagnosis not present

## 2022-04-13 DIAGNOSIS — M5137 Other intervertebral disc degeneration, lumbosacral region: Secondary | ICD-10-CM | POA: Diagnosis not present

## 2022-04-20 DIAGNOSIS — G894 Chronic pain syndrome: Secondary | ICD-10-CM | POA: Diagnosis not present

## 2022-04-20 DIAGNOSIS — M5137 Other intervertebral disc degeneration, lumbosacral region: Secondary | ICD-10-CM | POA: Diagnosis not present

## 2022-04-20 DIAGNOSIS — M961 Postlaminectomy syndrome, not elsewhere classified: Secondary | ICD-10-CM | POA: Diagnosis not present

## 2022-04-20 DIAGNOSIS — Z79891 Long term (current) use of opiate analgesic: Secondary | ICD-10-CM | POA: Diagnosis not present

## 2022-04-20 DIAGNOSIS — M545 Low back pain, unspecified: Secondary | ICD-10-CM | POA: Diagnosis not present

## 2022-04-28 ENCOUNTER — Ambulatory Visit (INDEPENDENT_AMBULATORY_CARE_PROVIDER_SITE_OTHER): Payer: Medicare Other | Admitting: Internal Medicine

## 2022-04-28 ENCOUNTER — Encounter: Payer: Self-pay | Admitting: Internal Medicine

## 2022-04-28 ENCOUNTER — Other Ambulatory Visit: Payer: Self-pay

## 2022-04-28 VITALS — BP 118/72 | HR 74 | Temp 98.5°F | Ht 67.0 in | Wt 224.2 lb

## 2022-04-28 DIAGNOSIS — E1149 Type 2 diabetes mellitus with other diabetic neurological complication: Secondary | ICD-10-CM

## 2022-04-28 DIAGNOSIS — M545 Low back pain, unspecified: Secondary | ICD-10-CM

## 2022-04-28 DIAGNOSIS — R0602 Shortness of breath: Secondary | ICD-10-CM | POA: Diagnosis not present

## 2022-04-28 DIAGNOSIS — I10 Essential (primary) hypertension: Secondary | ICD-10-CM | POA: Diagnosis not present

## 2022-04-28 DIAGNOSIS — D649 Anemia, unspecified: Secondary | ICD-10-CM | POA: Diagnosis not present

## 2022-04-28 DIAGNOSIS — G4730 Sleep apnea, unspecified: Secondary | ICD-10-CM

## 2022-04-28 DIAGNOSIS — E871 Hypo-osmolality and hyponatremia: Secondary | ICD-10-CM | POA: Diagnosis not present

## 2022-04-28 DIAGNOSIS — K7460 Unspecified cirrhosis of liver: Secondary | ICD-10-CM | POA: Diagnosis not present

## 2022-04-28 DIAGNOSIS — E7800 Pure hypercholesterolemia, unspecified: Secondary | ICD-10-CM

## 2022-04-28 DIAGNOSIS — K219 Gastro-esophageal reflux disease without esophagitis: Secondary | ICD-10-CM

## 2022-04-28 DIAGNOSIS — K5900 Constipation, unspecified: Secondary | ICD-10-CM

## 2022-04-28 DIAGNOSIS — I700 Atherosclerosis of aorta: Secondary | ICD-10-CM

## 2022-04-28 DIAGNOSIS — D696 Thrombocytopenia, unspecified: Secondary | ICD-10-CM | POA: Diagnosis not present

## 2022-04-28 DIAGNOSIS — G8929 Other chronic pain: Secondary | ICD-10-CM

## 2022-04-28 LAB — CBC WITH DIFFERENTIAL/PLATELET
Basophils Absolute: 0 10*3/uL (ref 0.0–0.1)
Basophils Relative: 0.4 % (ref 0.0–3.0)
Eosinophils Absolute: 0.1 10*3/uL (ref 0.0–0.7)
Eosinophils Relative: 1.9 % (ref 0.0–5.0)
HCT: 38.3 % — ABNORMAL LOW (ref 39.0–52.0)
Hemoglobin: 12.6 g/dL — ABNORMAL LOW (ref 13.0–17.0)
Lymphocytes Relative: 35.7 % (ref 12.0–46.0)
Lymphs Abs: 2.8 10*3/uL (ref 0.7–4.0)
MCHC: 32.9 g/dL (ref 30.0–36.0)
MCV: 84.8 fl (ref 78.0–100.0)
Monocytes Absolute: 0.8 10*3/uL (ref 0.1–1.0)
Monocytes Relative: 10.4 % (ref 3.0–12.0)
Neutro Abs: 4 10*3/uL (ref 1.4–7.7)
Neutrophils Relative %: 51.6 % (ref 43.0–77.0)
Platelets: 126 10*3/uL — ABNORMAL LOW (ref 150.0–400.0)
RBC: 4.52 Mil/uL (ref 4.22–5.81)
RDW: 14.4 % (ref 11.5–15.5)
WBC: 7.8 10*3/uL (ref 4.0–10.5)

## 2022-04-28 LAB — HEPATIC FUNCTION PANEL
ALT: 26 U/L (ref 0–53)
AST: 40 U/L — ABNORMAL HIGH (ref 0–37)
Albumin: 4.3 g/dL (ref 3.5–5.2)
Alkaline Phosphatase: 68 U/L (ref 39–117)
Bilirubin, Direct: 0.2 mg/dL (ref 0.0–0.3)
Total Bilirubin: 0.8 mg/dL (ref 0.2–1.2)
Total Protein: 7.3 g/dL (ref 6.0–8.3)

## 2022-04-28 LAB — MICROALBUMIN / CREATININE URINE RATIO
Creatinine,U: 160.6 mg/dL
Microalb Creat Ratio: 1.2 mg/g (ref 0.0–30.0)
Microalb, Ur: 1.9 mg/dL (ref 0.0–1.9)

## 2022-04-28 LAB — PROTIME-INR
INR: 1.1 ratio — ABNORMAL HIGH (ref 0.8–1.0)
Prothrombin Time: 12.2 s (ref 9.6–13.1)

## 2022-04-28 LAB — SODIUM: Sodium: 136 mEq/L (ref 135–145)

## 2022-04-28 MED ORDER — PANTOPRAZOLE SODIUM 40 MG PO TBEC: 40.0000 mg | DELAYED_RELEASE_TABLET | Freq: Every day | ORAL | 3 refills | Status: DC

## 2022-04-28 MED ORDER — PANTOPRAZOLE SODIUM 40 MG PO TBEC: 40.0000 mg | DELAYED_RELEASE_TABLET | Freq: Two times a day (BID) | ORAL | 2 refills | Status: DC

## 2022-04-28 NOTE — Progress Notes (Signed)
[name], male   DOB: 09-06-1971, 51 y.o.   MRN: [id number]   Subjective:    [name], male    DOB: 10-05-1971, 51 y.o.   MRN: [id number]   Patient here for a scheduled follow up.   Chief Complaint  Patient presents with   Hypertension   Diabetes   .   HPI Last a1c 02/2022 - 8.6.  was started on metformin.  Has not been taking.  Not watching his sugars.  Discussed low carb diet and exercise.  Discussed importance of getting sugars under control and taking medication regularly.  Increased acid reflux.  Issue with protonix.  Discussed taking twice a day - before meals.  Persistent issues with constipation.  When in a certain position - hard to get a good breath.  Taking dulcolax, etc to try and keep his bowels moving.  No chest pain.  SOB as outlined.  Recent labs - elevated AST and decreased sodium and platelet count.  Denies alcohol intake.     Past Medical History:  Diagnosis Date   Allergy    Chronic back pain    Diabetes mellitus without complication ([place])    Hypercholesterolemia    Hypertension    Migraines    Past Surgical History:  Procedure Laterality Date   BACK SURGERY  08/12/14   multiple   CHOLECYSTECTOMY     COLONOSCOPY WITH PROPOFOL N/A 03/11/2022   Procedure: COLONOSCOPY WITH PROPOFOL;  Surgeon: [name], MD;  Location: [facility] ENDOSCOPY;  Service: Gastroenterology;  Laterality: N/A;   ESOPHAGOGASTRODUODENOSCOPY N/A 03/11/2022   Procedure: ESOPHAGOGASTRODUODENOSCOPY (EGD);  Surgeon: [name], MD;  Location: [facility] ENDOSCOPY;  Service: Gastroenterology;  Laterality: N/A;   KNEE ARTHROSCOPY     Dr HBK   Family History  Problem Relation Age of Onset   Cancer Father        germ cell (retroperitoneal)   Coronary artery disease Other        s/p stent (age 28)   Hypertension Paternal Grandfather    Kidney cancer Paternal Grandfather    Diabetes Paternal Grandfather    Diabetes Maternal Grandfather    Heart disease Cousin         MI - age 72   Social History   Socioeconomic History   Marital status: Married    Spouse name: Not on file   Number of children: 2   Years of education: 12   Highest education level: Not on file  Occupational History   Occupation: disabled    Employer: [employer]  Tobacco Use   Smoking status: Never   Smokeless tobacco: Never  Vaping Use   Vaping Use: Never used  Substance and Sexual Activity   Alcohol use: No    Alcohol/week: 0.0 standard drinks of alcohol   Drug use: No   Sexual activity: Not on file  Other Topics Concern   Not on file  Social History Narrative   Lives with girlfriend in a one story home.  Has 2 sons.  Trying to get disability due to several back issues.  Education: high school.    Social Determinants of Health   Financial Resource Strain: Low Risk  (02/24/2022)   Overall Financial Resource Strain (CARDIA)    Difficulty of Paying Living Expenses: Not hard at all  Food Insecurity: No Food Insecurity (02/24/2022)   Hunger Vital Sign    Worried About Running Out of Food in the Last Year: Never true  Ran Out of Food in the Last Year: Never true  Transportation Needs: No Transportation Needs (02/24/2022)   PRAPARE - Hydrologist (Medical): No    Lack of Transportation (Non-Medical): No  Physical Activity: Not on file  Stress: No Stress Concern Present (02/24/2022)   [place]    Feeling of Stress : Not at all  Social Connections: Unknown (02/24/2022)   Social Connection and Isolation Panel [NHANES]    Frequency of Communication with Friends and Family: Not on file    Frequency of Social Gatherings with Friends and Family: Not on file    Attends Religious Services: Not on file    Active Member of Clubs or Organizations: Not on file    Attends Archivist Meetings: Not on file    Marital Status: Married     Review of Systems   Constitutional:  Negative for appetite change and unexpected weight change.  HENT:  Negative for congestion and sinus pressure.   Respiratory:  Positive for shortness of breath. Negative for cough and chest tightness.   Cardiovascular:  Negative for chest pain, palpitations and leg swelling.  Gastrointestinal:  Positive for constipation. Negative for nausea and vomiting.  Genitourinary:  Negative for difficulty urinating and dysuria.  Musculoskeletal:  Positive for back pain. Negative for joint swelling.  Skin:  Negative for color change and rash.  Neurological:  Negative for dizziness, light-headedness and headaches.  Psychiatric/Behavioral:  Negative for agitation and dysphoric mood.        Objective:     BP 118/72 (BP Location: Left Arm, Patient Position: Sitting, Cuff Size: Large)   Pulse 74   Temp 98.5 F (36.9 C) (Oral)   Ht 5' 7"  (1.702 m)   Wt 224 lb 3.2 oz (101.7 kg)   SpO2 98%   BMI 35.11 kg/m  Wt Readings from Last 3 Encounters:  04/28/22 224 lb 3.2 oz (101.7 kg)  03/11/22 221 lb (100.2 kg)  02/26/22 224 lb (101.6 kg)    Physical Exam Constitutional:      General: He is not in acute distress.    Appearance: Normal appearance. He is well-developed.  HENT:     Head: Normocephalic and atraumatic.     Right Ear: External ear normal.     Left Ear: External ear normal.  Eyes:     General: No scleral icterus.       Right eye: No discharge.        Left eye: No discharge.  Cardiovascular:     Rate and Rhythm: Normal rate and regular rhythm.  Pulmonary:     Effort: Pulmonary effort is normal. No respiratory distress.     Breath sounds: Normal breath sounds.  Abdominal:     General: Bowel sounds are normal.     Palpations: Abdomen is soft.     Tenderness: There is no abdominal tenderness.  Musculoskeletal:        General: No swelling or tenderness.     Cervical back: Neck supple. No tenderness.  Lymphadenopathy:     Cervical: No cervical adenopathy.  Skin:     Findings: No erythema or rash.  Neurological:     Mental Status: He is alert.  Psychiatric:        Mood and Affect: Mood normal.        Behavior: Behavior normal.      Outpatient Encounter Medications as of 04/28/2022  Medication Sig   allopurinol (  ZYLOPRIM) 300 MG tablet TAKE 1 TABLET BY MOUTH EVERY DAY   atorvastatin (LIPITOR) 40 MG tablet TAKE 1 TABLET BY MOUTH EVERY DAY (Patient taking differently: Take 40 mg by mouth daily.)   metaxalone (SKELAXIN) 800 MG tablet Take 800 mg by mouth 3 (three) times daily.   metFORMIN (GLUCOPHAGE) 500 MG tablet Take 1 tablet (500 mg total) by mouth 2 (two) times daily with a meal.   metoprolol succinate (TOPROL-XL) 50 MG 24 hr tablet TAKE 2 TABLETS BY MOUTH DAILY WITH OR IMMEDIATELY FOLLOWING A MEAL.   oxyCODONE (ROXICODONE) 15 MG immediate release tablet Take 15 mg by mouth every 4 (four) hours.   triamterene-hydrochlorothiazide (MAXZIDE-25) 37.5-25 MG tablet TAKE 1 TABLET BY MOUTH EVERY DAY   [DISCONTINUED] Oxycodone HCl 10 MG TABS Take 10 mg by mouth every 6 (six) hours as needed (for pain).   [DISCONTINUED] pantoprazole (PROTONIX) 40 MG tablet Take 1 tablet (40 mg total) by mouth daily.   pantoprazole (PROTONIX) 40 MG tablet Take 1 tablet (40 mg total) by mouth 2 (two) times daily before a meal.   No facility-administered encounter medications on file as of 04/28/2022.     Lab Results  Component Value Date   WBC 7.8 04/28/2022   HGB 12.6 (L) 04/28/2022   HCT 38.3 (L) 04/28/2022   PLT 126.0 (L) 04/28/2022   GLUCOSE 162 (H) 02/26/2022   CHOL 122 02/26/2022   TRIG 97.0 02/26/2022   HDL 47.30 02/26/2022   LDLDIRECT 142.0 06/21/2019   LDLCALC 56 02/26/2022   ALT 26 04/28/2022   AST 40 (H) 04/28/2022   NA 136 04/28/2022   K 4.0 02/26/2022   CL 100 02/26/2022   CREATININE 0.85 02/26/2022   BUN 11 02/26/2022   CO2 26 02/26/2022   TSH 1.89 05/25/2021   PSA 0.43 01/04/2018   INR 1.1 (H) 04/28/2022   HGBA1C 8.6 (H) 02/26/2022    MICROALBUR 1.9 04/28/2022       Assessment & Plan:   Problem List Items Addressed This Visit     Anemia    Follow cbc.       Relevant Orders   Ferritin   Aortic atherosclerosis (HCC)    Continue lipitor.       Chronic back pain    Followed by pain clinic.  On oxycodone.  Discussed the need to keep bowel moving.  Discussed bowel regimen.        Relevant Medications   oxyCODONE (ROXICODONE) 15 MG immediate release tablet   Cirrhosis of liver ([place])    Noted on recent CT.   Follow liver function tests. Recent ast slightly elevated.  Decreased platelet count.  Follow up with GI.  Also, recheck liver and cbc today.  Schedule abdominal ultrasound to reevaluate liver/spleen.       Relevant Orders   US Abdomen Complete   Hepatic function panel   Constipation    Takes narcotic pain medication. Discussed bowel regimen - to keep bowels moving.  Saw GI.  Colonoscopy 03/11/22 - Four 2 to 3 mm polyps in the cecum, removed with a cold biopsy forceps. Resected and retrieved. Two 4 to 6 mm polyps in the cecum, removed with a cold snare. Resected and retrieved. Three 5 to 7 mm polyps in the ascending colon, removed with a cold snare. Resected and retrieved. One 15 mm polyp in the ascending colon, removed with mucosal resection. Resected and retrieved. Clip was placed. The examination was otherwise normal on direct and retroflexion views. Mucosal resection was  performed. Resection and retrieval were complete. Continue f/u with GI.  Recommended f/u colonoscopy 6-8 months.       GERD (gastroesophageal reflux disease)    Acid reflux as outlined.  protonix bid.  F/u with GI as discussed.       Relevant Medications   pantoprazole (PROTONIX) 40 MG tablet   Hypercholesterolemia    On lipitor.  Low cholesterol diet and exercise.  Follow lipid panel and liver function tests.        Relevant Orders   Lipid panel   Hypertension    Blood pressure doing well.  Continue triam/hctz.  Follow pressures.   Follow metabolic panel.       Sleep apnea    Have discussed the need to use cpap.       SOB (shortness of breath)    Describes the sob or feeling of hard to get a good breath as outlined.  Appears to be more positional and worse when constipated.  EKG - SR with no acute ischemic changes noted.  Had discussed checking an abdominal ultrasound - f/u liver and spleen.  Keep bowels moving.  No chest pain with increased activity or exertion.  Follow.       Relevant Orders   EKG 12-Lead (Completed)   Type 2 diabetes mellitus with neurological complications (HCC) - Primary    Sugars elevated as outlined.  Discussed diet and exercise.  Not taking metformin.  Discussed the need to take regularly.  Follow met b and a1c.        Relevant Orders   Urine microalbumin-creatinine with uACR (Completed)   Hemoglobin C8Y   Basic metabolic panel   Microalbumin / creatinine urine ratio   Other Visit Diagnoses     Hyponatremia with normal extracellular fluid volume       Thrombocytopenia (HCC)       Relevant Orders   US Abdomen Complete   CBC with Differential/Platelet        Einar Pheasant, MD

## 2022-04-29 ENCOUNTER — Other Ambulatory Visit: Payer: Self-pay | Admitting: Internal Medicine

## 2022-04-29 ENCOUNTER — Other Ambulatory Visit (INDEPENDENT_AMBULATORY_CARE_PROVIDER_SITE_OTHER): Payer: Medicare Other

## 2022-04-29 DIAGNOSIS — D649 Anemia, unspecified: Secondary | ICD-10-CM

## 2022-04-29 LAB — IBC + FERRITIN
Ferritin: 15.9 ng/mL — ABNORMAL LOW (ref 22.0–322.0)
Iron: 51 ug/dL (ref 42–165)
Saturation Ratios: 9.8 % — ABNORMAL LOW (ref 20.0–50.0)
TIBC: 522.2 ug/dL — ABNORMAL HIGH (ref 250.0–450.0)
Transferrin: 373 mg/dL — ABNORMAL HIGH (ref 212.0–360.0)

## 2022-04-29 LAB — VITAMIN B12: Vitamin B-12: 414 pg/mL (ref 211–911)

## 2022-04-29 NOTE — Progress Notes (Signed)
Order placed for f/u labs.  

## 2022-05-03 ENCOUNTER — Encounter: Payer: Self-pay | Admitting: Internal Medicine

## 2022-05-03 NOTE — Assessment & Plan Note (Signed)
Continue lipitor  ?

## 2022-05-03 NOTE — Assessment & Plan Note (Addendum)
Describes the sob or feeling of hard to get a good breath as outlined.  Appears to be more positional and worse when constipated.  EKG - SR with no acute ischemic changes noted.  Had discussed checking an abdominal ultrasound - f/u liver and spleen.  Keep bowels moving.  No chest pain with increased activity or exertion.  Follow.

## 2022-05-03 NOTE — Assessment & Plan Note (Signed)
Sugars elevated as outlined.  Discussed diet and exercise.  Not taking metformin.  Discussed the need to take regularly.  Follow met b and a1c.

## 2022-05-03 NOTE — Assessment & Plan Note (Signed)
Acid reflux as outlined.  protonix bid.  F/u with GI as discussed.

## 2022-05-03 NOTE — Assessment & Plan Note (Signed)
On lipitor.  Low cholesterol diet and exercise.  Follow lipid panel and liver function tests.   

## 2022-05-03 NOTE — Assessment & Plan Note (Signed)
Follow cbc.  

## 2022-05-03 NOTE — Assessment & Plan Note (Addendum)
Noted on recent CT.   Follow liver function tests. Recent ast slightly elevated.  Decreased platelet count.  Follow up with GI.  Also, recheck liver and cbc today.  Schedule abdominal ultrasound to reevaluate liver/spleen.

## 2022-05-03 NOTE — Assessment & Plan Note (Signed)
Have discussed the need to use cpap.

## 2022-05-03 NOTE — Assessment & Plan Note (Signed)
Followed by pain clinic.  On oxycodone.  Discussed the need to keep bowel moving.  Discussed bowel regimen.   

## 2022-05-03 NOTE — Assessment & Plan Note (Signed)
Blood pressure doing well.  Continue triam/hctz.  Follow pressures.  Follow metabolic panel.  

## 2022-05-03 NOTE — Assessment & Plan Note (Signed)
Takes narcotic pain medication. Discussed bowel regimen - to keep bowels moving.  Saw GI.  Colonoscopy 03/11/22 - Four 2 to 3 mm polyps in the cecum, removed with a cold biopsy forceps. Resected and retrieved. Two 4 to 6 mm polyps in the cecum, removed with a cold snare. Resected and retrieved. Three 5 to 7 mm polyps in the ascending colon, removed with a cold snare. Resected and retrieved. One 15 mm polyp in the ascending colon, removed with mucosal resection. Resected and retrieved. Clip was placed. The examination was otherwise normal on direct and retroflexion views. Mucosal resection was performed. Resection and retrieval were complete. Continue f/u with GI.  Recommended f/u colonoscopy 6-8 months.

## 2022-05-04 ENCOUNTER — Telehealth: Payer: Self-pay

## 2022-05-04 DIAGNOSIS — M545 Low back pain, unspecified: Secondary | ICD-10-CM | POA: Diagnosis not present

## 2022-05-04 DIAGNOSIS — Z79891 Long term (current) use of opiate analgesic: Secondary | ICD-10-CM | POA: Diagnosis not present

## 2022-05-04 DIAGNOSIS — M5137 Other intervertebral disc degeneration, lumbosacral region: Secondary | ICD-10-CM | POA: Diagnosis not present

## 2022-05-04 DIAGNOSIS — G894 Chronic pain syndrome: Secondary | ICD-10-CM | POA: Diagnosis not present

## 2022-05-04 DIAGNOSIS — M961 Postlaminectomy syndrome, not elsewhere classified: Secondary | ICD-10-CM | POA: Diagnosis not present

## 2022-05-04 NOTE — Telephone Encounter (Signed)
Lm for pt to cb re : results

## 2022-05-04 NOTE — Telephone Encounter (Signed)
-----   Message from Einar Pheasant, MD sent at 04/30/2022  5:27 AM EDT ----- Cameron Sanchez that one liver test is slightly elevated.  Remainder of liver function wnl.  Given his symptoms , previous scans and liver function test results, I would like to check an abdominal ultrasound to reevaluate his liver.  Hgb has decreased.  Iron stores are low.  He has seen GI (Dr Vicente Males) and had EGD and colonoscopy.  Needs a f/u appt with Dr Vicente Males for evaluation of iron deficient anemia - to complete the work up.  Also, I want him to see him regarding his liver as well.  Platelet count remains slightly decreased, but stable.  We will continue to follow.  Sodium and B12 are wnl.

## 2022-05-05 ENCOUNTER — Telehealth: Payer: Self-pay

## 2022-05-05 NOTE — Telephone Encounter (Signed)
LMTCB for lab results.  

## 2022-05-11 DIAGNOSIS — M961 Postlaminectomy syndrome, not elsewhere classified: Secondary | ICD-10-CM | POA: Diagnosis not present

## 2022-05-11 DIAGNOSIS — G894 Chronic pain syndrome: Secondary | ICD-10-CM | POA: Diagnosis not present

## 2022-05-11 DIAGNOSIS — M545 Low back pain, unspecified: Secondary | ICD-10-CM | POA: Diagnosis not present

## 2022-05-11 DIAGNOSIS — Z79891 Long term (current) use of opiate analgesic: Secondary | ICD-10-CM | POA: Diagnosis not present

## 2022-05-11 DIAGNOSIS — M5137 Other intervertebral disc degeneration, lumbosacral region: Secondary | ICD-10-CM | POA: Diagnosis not present

## 2022-05-19 ENCOUNTER — Ambulatory Visit
Admission: RE | Admit: 2022-05-19 | Discharge: 2022-05-19 | Disposition: A | Discharge status: Home / Self Care | Payer: Medicare Other | Source: Ambulatory Visit | Attending: Internal Medicine | Admitting: Internal Medicine

## 2022-05-19 DIAGNOSIS — K7689 Other specified diseases of liver: Secondary | ICD-10-CM | POA: Diagnosis not present

## 2022-05-19 DIAGNOSIS — D696 Thrombocytopenia, unspecified: Secondary | ICD-10-CM | POA: Diagnosis not present

## 2022-05-19 DIAGNOSIS — Z9049 Acquired absence of other specified parts of digestive tract: Secondary | ICD-10-CM | POA: Diagnosis not present

## 2022-05-19 DIAGNOSIS — K7460 Unspecified cirrhosis of liver: Secondary | ICD-10-CM | POA: Diagnosis not present

## 2022-05-19 DIAGNOSIS — R160 Hepatomegaly, not elsewhere classified: Secondary | ICD-10-CM | POA: Diagnosis not present

## 2022-05-20 ENCOUNTER — Other Ambulatory Visit: Payer: Self-pay | Admitting: Internal Medicine

## 2022-05-20 DIAGNOSIS — K769 Liver disease, unspecified: Secondary | ICD-10-CM

## 2022-05-20 NOTE — Progress Notes (Signed)
Order placed for MRI abdomen.

## 2022-05-21 ENCOUNTER — Telehealth: Payer: Self-pay | Admitting: Internal Medicine

## 2022-05-21 NOTE — Telephone Encounter (Signed)
Lft pt vm to call ofc . thanks 

## 2022-05-24 ENCOUNTER — Telehealth: Payer: Self-pay | Admitting: Internal Medicine

## 2022-05-24 MED ORDER — LORAZEPAM 0.5 MG PO TABS: ORAL_TABLET | ORAL | 0 refills | Status: DC

## 2022-05-24 NOTE — Telephone Encounter (Signed)
Patient returned [place] call.  I transferred call to [place].

## 2022-05-24 NOTE — Telephone Encounter (Signed)
I received a message from [place] that he needed something to help him relax for his MRI.  I have sent in a lorazepam to take with him to his scan.  He is to carry with him and take just prior to his test.  Rx sent in to pharmacy.

## 2022-05-25 NOTE — Telephone Encounter (Signed)
Pt is aware that medication has been sent in. Pt was also advised that he should call his pain management doctor to let him know the medication was prescribed for a one time use for his MRI. Pt gave a verbal understanding.

## 2022-05-26 ENCOUNTER — Other Ambulatory Visit: Payer: Self-pay | Admitting: Internal Medicine

## 2022-05-28 MED ORDER — LORAZEPAM 0.5 MG PO TABS: ORAL_TABLET | ORAL | 0 refills | Status: DC

## 2022-05-28 NOTE — Telephone Encounter (Signed)
Patient called and pharmacy does not have the prescription for his LORazepam (ATIVAN) 0.5 MG tablet. I looks like it did not go through to [facility]. Procedure is this coming Monday.

## 2022-05-28 NOTE — Telephone Encounter (Signed)
I called it in to the pharmacy voicemail & notified patient.

## 2022-05-28 NOTE — Addendum Note (Signed)
Addended by: Leeanne Rio on: 05/28/2022 02:37 PM   Modules accepted: Orders

## 2022-05-31 ENCOUNTER — Ambulatory Visit: Admission: RE | Admit: 2022-05-31 | Payer: Medicare Other | Source: Ambulatory Visit

## 2022-06-01 DIAGNOSIS — M545 Low back pain, unspecified: Secondary | ICD-10-CM | POA: Diagnosis not present

## 2022-06-01 DIAGNOSIS — M961 Postlaminectomy syndrome, not elsewhere classified: Secondary | ICD-10-CM | POA: Diagnosis not present

## 2022-06-01 DIAGNOSIS — G894 Chronic pain syndrome: Secondary | ICD-10-CM | POA: Diagnosis not present

## 2022-06-01 DIAGNOSIS — Z79891 Long term (current) use of opiate analgesic: Secondary | ICD-10-CM | POA: Diagnosis not present

## 2022-06-01 DIAGNOSIS — M5137 Other intervertebral disc degeneration, lumbosacral region: Secondary | ICD-10-CM | POA: Diagnosis not present

## 2022-06-04 ENCOUNTER — Other Ambulatory Visit: Payer: Self-pay | Admitting: Internal Medicine

## 2022-06-10 ENCOUNTER — Other Ambulatory Visit (INDEPENDENT_AMBULATORY_CARE_PROVIDER_SITE_OTHER): Payer: Medicare Other

## 2022-06-10 DIAGNOSIS — E7800 Pure hypercholesterolemia, unspecified: Secondary | ICD-10-CM | POA: Diagnosis not present

## 2022-06-10 DIAGNOSIS — E1149 Type 2 diabetes mellitus with other diabetic neurological complication: Secondary | ICD-10-CM | POA: Diagnosis not present

## 2022-06-10 DIAGNOSIS — D649 Anemia, unspecified: Secondary | ICD-10-CM

## 2022-06-10 DIAGNOSIS — D696 Thrombocytopenia, unspecified: Secondary | ICD-10-CM | POA: Diagnosis not present

## 2022-06-10 DIAGNOSIS — K7460 Unspecified cirrhosis of liver: Secondary | ICD-10-CM

## 2022-06-10 LAB — LIPID PANEL
Cholesterol: 122 mg/dL (ref 0–200)
HDL: 43.6 mg/dL (ref >39.00)
LDL Cholesterol: 43 mg/dL (ref 0–99)
NonHDL: 78.38
Total CHOL/HDL Ratio: 3
Triglycerides: 179 mg/dL — ABNORMAL HIGH (ref 0.0–149.0)
VLDL: 35.8 mg/dL (ref 0.0–40.0)

## 2022-06-10 LAB — FERRITIN: Ferritin: 18 ng/mL — ABNORMAL LOW (ref 22.0–322.0)

## 2022-06-10 LAB — HEPATIC FUNCTION PANEL
ALT: 31 U/L (ref 0–53)
AST: 38 U/L — ABNORMAL HIGH (ref 0–37)
Albumin: 4.3 g/dL (ref 3.5–5.2)
Alkaline Phosphatase: 73 U/L (ref 39–117)
Bilirubin, Direct: 0.2 mg/dL (ref 0.0–0.3)
Total Bilirubin: 0.6 mg/dL (ref 0.2–1.2)
Total Protein: 7.2 g/dL (ref 6.0–8.3)

## 2022-06-10 LAB — CBC WITH DIFFERENTIAL/PLATELET
Basophils Absolute: 0 10*3/uL (ref 0.0–0.1)
Basophils Relative: 0.3 % (ref 0.0–3.0)
Eosinophils Absolute: 0.1 10*3/uL (ref 0.0–0.7)
Eosinophils Relative: 1.7 % (ref 0.0–5.0)
HCT: 37.4 % — ABNORMAL LOW (ref 39.0–52.0)
Hemoglobin: 12.2 g/dL — ABNORMAL LOW (ref 13.0–17.0)
Lymphocytes Relative: 36.8 % (ref 12.0–46.0)
Lymphs Abs: 2.7 10*3/uL (ref 0.7–4.0)
MCHC: 32.5 g/dL (ref 30.0–36.0)
MCV: 83 fl (ref 78.0–100.0)
Monocytes Absolute: 0.8 10*3/uL (ref 0.1–1.0)
Monocytes Relative: 10.4 % (ref 3.0–12.0)
Neutro Abs: 3.7 10*3/uL (ref 1.4–7.7)
Neutrophils Relative %: 50.8 % (ref 43.0–77.0)
Platelets: 138 10*3/uL — ABNORMAL LOW (ref 150.0–400.0)
RBC: 4.51 Mil/uL (ref 4.22–5.81)
RDW: 15.2 % (ref 11.5–15.5)
WBC: 7.3 10*3/uL (ref 4.0–10.5)

## 2022-06-10 LAB — HEMOGLOBIN A1C: Hgb A1c MFr Bld: 10.5 % — ABNORMAL HIGH (ref 4.6–6.5)

## 2022-06-10 LAB — BASIC METABOLIC PANEL
BUN: 12 mg/dL (ref 6–23)
CO2: 26 mEq/L (ref 19–32)
Calcium: 9.5 mg/dL (ref 8.4–10.5)
Chloride: 99 mEq/L (ref 96–112)
Creatinine, Ser: 0.9 mg/dL (ref 0.40–1.50)
GFR: 97.79 mL/min (ref >60.00)
Glucose, Bld: 286 mg/dL — ABNORMAL HIGH (ref 70–99)
Potassium: 4 mEq/L (ref 3.5–5.1)
Sodium: 134 mEq/L — ABNORMAL LOW (ref 135–145)

## 2022-06-10 LAB — MICROALBUMIN / CREATININE URINE RATIO
Creatinine,U: 101.2 mg/dL
Microalb Creat Ratio: 0.9 mg/g (ref 0.0–30.0)
Microalb, Ur: 0.9 mg/dL (ref 0.0–1.9)

## 2022-06-11 ENCOUNTER — Telehealth: Payer: Self-pay

## 2022-06-11 NOTE — Telephone Encounter (Signed)
Patient states he is returning our call regarding his lab results.  I transferred call to Cheri Rous, [place].

## 2022-06-11 NOTE — Telephone Encounter (Signed)
LMTCB for lab results.  

## 2022-06-11 NOTE — Telephone Encounter (Signed)
Lab reviewed with patient.

## 2022-06-15 ENCOUNTER — Ambulatory Visit
Admission: RE | Admit: 2022-06-15 | Discharge: 2022-06-15 | Disposition: A | Discharge status: Home / Self Care | Payer: Medicare Other | Source: Ambulatory Visit | Attending: Internal Medicine | Admitting: Internal Medicine

## 2022-06-15 DIAGNOSIS — K7460 Unspecified cirrhosis of liver: Secondary | ICD-10-CM | POA: Diagnosis not present

## 2022-06-15 DIAGNOSIS — K769 Liver disease, unspecified: Secondary | ICD-10-CM | POA: Insufficient documentation

## 2022-06-15 DIAGNOSIS — K760 Fatty (change of) liver, not elsewhere classified: Secondary | ICD-10-CM | POA: Diagnosis not present

## 2022-06-15 DIAGNOSIS — R161 Splenomegaly, not elsewhere classified: Secondary | ICD-10-CM | POA: Diagnosis not present

## 2022-06-15 MED ORDER — GADOBUTROL 1 MMOL/ML IV SOLN
10.0000 mL | Freq: Once | INTRAVENOUS | Status: AC | PRN
  Administered 2022-06-15: 10 mL via INTRAVENOUS

## 2022-06-16 ENCOUNTER — Encounter: Payer: Self-pay | Admitting: Internal Medicine

## 2022-06-16 ENCOUNTER — Ambulatory Visit (INDEPENDENT_AMBULATORY_CARE_PROVIDER_SITE_OTHER): Payer: Medicare Other | Admitting: Internal Medicine

## 2022-06-16 VITALS — BP 130/80 | HR 69 | Temp 98.4°F | Resp 15 | Ht 67.0 in | Wt 221.4 lb

## 2022-06-16 DIAGNOSIS — K219 Gastro-esophageal reflux disease without esophagitis: Secondary | ICD-10-CM

## 2022-06-16 DIAGNOSIS — E7800 Pure hypercholesterolemia, unspecified: Secondary | ICD-10-CM | POA: Diagnosis not present

## 2022-06-16 DIAGNOSIS — I10 Essential (primary) hypertension: Secondary | ICD-10-CM | POA: Diagnosis not present

## 2022-06-16 DIAGNOSIS — K769 Liver disease, unspecified: Secondary | ICD-10-CM

## 2022-06-16 DIAGNOSIS — E1149 Type 2 diabetes mellitus with other diabetic neurological complication: Secondary | ICD-10-CM | POA: Diagnosis not present

## 2022-06-16 DIAGNOSIS — M545 Low back pain, unspecified: Secondary | ICD-10-CM | POA: Diagnosis not present

## 2022-06-16 DIAGNOSIS — G8929 Other chronic pain: Secondary | ICD-10-CM | POA: Diagnosis not present

## 2022-06-16 DIAGNOSIS — I700 Atherosclerosis of aorta: Secondary | ICD-10-CM

## 2022-06-16 DIAGNOSIS — K7460 Unspecified cirrhosis of liver: Secondary | ICD-10-CM | POA: Diagnosis not present

## 2022-06-16 NOTE — Progress Notes (Signed)
[name], male   DOB: Sep 27, 1971, 51 y.o.   MRN: [id number]   Subjective:    [name], male    DOB: [birth year], 51 y.o.   MRN: [id number]   Patient here for a scheduled follow up.  Marland Kitchen   HPI Here to follow up regarding his diabetes, hypertension and hypercholesterolemia.  Also, recently had MRI to f/u liver lesion.  MRI revealed: Fluid signal, nonenhancing simple cyst of the inferior right lobe of the liver, hepatic segment VI, measuring 1.1 cm corresponding to finding of prior  ultrasound. No specific further follow-up or characterization is required for this benign cyst. Focal arterially hyperenhancing lesion of the posterior right lobe of the liver, hepatic segment VI, measuring 0.8 cm. No evidence of washout or capsular enhancement. This is of intermediate suspicion for hepatocellular carcinoma the setting of cirrhosis. LI-RADS category 3. Recommend follow-up MRI in 6 months to assess for stability. Cirrhosis and splenomegaly. Hepatic steatosis. Discussed with him today.  He sees Dr Vicente Males.  Discussed scheduling f/u with Dr Vicente Males to review above and give recommendation regarding f/u and further w/up.  Discussed the need for better control of his sugars.  Has not been taking metformin. Discussed other treatment options.  Not checking sugars.  Discussed CGM.  Will have to see if covered or any pt assistance.  No chest pain.  Breathing stable.  No acid reflux reported.  No abdominal pain.  Issues with increased constipation.  Discussed.  Does not drink alcohol.    Past Medical History:  Diagnosis Date   Allergy    Chronic back pain    Diabetes mellitus without complication ([place])    Hypercholesterolemia    Hypertension    Migraines    Past Surgical History:  Procedure Laterality Date   BACK SURGERY  08/12/14   multiple   CHOLECYSTECTOMY     COLONOSCOPY WITH PROPOFOL N/A 03/11/2022   Procedure: COLONOSCOPY WITH PROPOFOL;  Surgeon: [name], MD;  Location: [facility]  ENDOSCOPY;  Service: Gastroenterology;  Laterality: N/A;   ESOPHAGOGASTRODUODENOSCOPY N/A 03/11/2022   Procedure: ESOPHAGOGASTRODUODENOSCOPY (EGD);  Surgeon: [name], MD;  Location[facility] ENDOSCOPY;  Service: Gastroenterology;  Laterality: N/A;   KNEE ARTHROSCOPY     Dr HBK   Family History  Problem Relation Age of Onset   Cancer Father        germ cell (retroperitoneal)   Coronary artery disease Other        s/p stent (age 90)   Hypertension Paternal Grandfather    Kidney cancer Paternal Grandfather    Diabetes Paternal Grandfather    Diabetes Maternal Grandfather    Heart disease Cousin        MI - age 62   Social History   Socioeconomic History   Marital status: Married    Spouse name: Not on file   Number of children: 2   Years of education: 12   Highest education level: Not on file  Occupational History   Occupation: disabled    Employer: [employer]  Tobacco Use   Smoking status: Never   Smokeless tobacco: Never  Vaping Use   Vaping Use: Never used  Substance and Sexual Activity   Alcohol use: No    Alcohol/week: 0.0 standard drinks of alcohol   Drug use: No   Sexual activity: Not on file  Other Topics Concern   Not on file  Social History Narrative   Lives with girlfriend in a one story home.  Has 2 sons.  Trying to get disability due to several back issues.  Education: high school.    Social Determinants of Health   Financial Resource Strain: Low Risk  (02/24/2022)   Overall Financial Resource Strain (CARDIA)    Difficulty of Paying Living Expenses: Not hard at all  Food Insecurity: No Food Insecurity (02/24/2022)   Hunger Vital Sign    Worried About Running Out of Food in the Last Year: Never true    Ran Out of Food in the Last Year: Never true  Transportation Needs: No Transportation Needs (02/24/2022)   PRAPARE - Hydrologist (Medical): No    Lack of Transportation (Non-Medical): No  Physical Activity: Not on  file  Stress: No Stress Concern Present (02/24/2022)   [place]    Feeling of Stress : Not at all  Social Connections: Unknown (02/24/2022)   Social Connection and Isolation Panel [NHANES]    Frequency of Communication with Friends and Family: Not on file    Frequency of Social Gatherings with Friends and Family: Not on file    Attends Religious Services: Not on file    Active Member of Clubs or Organizations: Not on file    Attends Archivist Meetings: Not on file    Marital Status: Married     Review of Systems  Constitutional:  Negative for appetite change and unexpected weight change.  HENT:  Negative for congestion and sinus pressure.   Respiratory:  Negative for cough, chest tightness and shortness of breath.   Cardiovascular:  Negative for chest pain, palpitations and leg swelling.  Gastrointestinal:  Positive for constipation. Negative for diarrhea, nausea and vomiting.  Genitourinary:  Negative for difficulty urinating and dysuria.  Musculoskeletal:  Positive for back pain. Negative for myalgias.  Skin:  Negative for color change and rash.  Neurological:  Negative for dizziness, light-headedness and headaches.  Psychiatric/Behavioral:  Negative for agitation and dysphoric mood.        Objective:     BP 130/80 (BP Location: Left Arm, Patient Position: Sitting, Cuff Size: Large)   Pulse 69   Temp 98.4 F (36.9 C) (Temporal)   Resp 15   Ht 5' 7"  (1.702 m)   Wt 221 lb 6.4 oz (100.4 kg)   SpO2 98%   BMI 34.68 kg/m  Wt Readings from Last 3 Encounters:  06/16/22 221 lb 6.4 oz (100.4 kg)  04/28/22 224 lb 3.2 oz (101.7 kg)  03/11/22 221 lb (100.2 kg)    Physical Exam Constitutional:      General: He is not in acute distress.    Appearance: Normal appearance. He is well-developed.  HENT:     Head: Normocephalic and atraumatic.     Right Ear: External ear normal.     Left Ear: External ear  normal.  Eyes:     General: No scleral icterus.       Right eye: No discharge.        Left eye: No discharge.  Cardiovascular:     Rate and Rhythm: Normal rate and regular rhythm.  Pulmonary:     Effort: Pulmonary effort is normal. No respiratory distress.     Breath sounds: Normal breath sounds.  Abdominal:     General: Bowel sounds are normal.     Palpations: Abdomen is soft.     Tenderness: There is no abdominal tenderness.  Musculoskeletal:        General:  No swelling or tenderness.     Cervical back: Neck supple. No tenderness.  Lymphadenopathy:     Cervical: No cervical adenopathy.  Skin:    Findings: No erythema or rash.  Neurological:     Mental Status: He is alert.  Psychiatric:        Mood and Affect: Mood normal.        Behavior: Behavior normal.      Outpatient Encounter Medications as of 06/16/2022  Medication Sig   allopurinol (ZYLOPRIM) 300 MG tablet TAKE 1 TABLET BY MOUTH EVERY DAY   atorvastatin (LIPITOR) 40 MG tablet TAKE 1 TABLET BY MOUTH EVERY DAY (Patient taking differently: Take 40 mg by mouth daily.)   LORazepam (ATIVAN) 0.5 MG tablet Take 1/2 tablet prior to procedure.   metaxalone (SKELAXIN) 800 MG tablet Take 800 mg by mouth 3 (three) times daily.   metFORMIN (GLUCOPHAGE) 500 MG tablet Take 1 tablet (500 mg total) by mouth 2 (two) times daily with a meal.   metoprolol succinate (TOPROL-XL) 50 MG 24 hr tablet TAKE 2 TABLETS BY MOUTH DAILY WITH OR IMMEDIATELY FOLLOWING A MEAL.   oxyCODONE (ROXICODONE) 15 MG immediate release tablet Take 15 mg by mouth every 4 (four) hours.   pantoprazole (PROTONIX) 40 MG tablet Take 1 tablet (40 mg total) by mouth 2 (two) times daily before a meal.   triamterene-hydrochlorothiazide (MAXZIDE-25) 37.5-25 MG tablet TAKE 1 TABLET BY MOUTH EVERY DAY   No facility-administered encounter medications on file as of 06/16/2022.     Lab Results  Component Value Date   WBC 7.3 06/10/2022   HGB 12.2 (L) 06/10/2022   HCT 37.4  (L) 06/10/2022   PLT 138.0 (L) 06/10/2022   GLUCOSE 286 (H) 06/10/2022   CHOL 122 06/10/2022   TRIG 179.0 (H) 06/10/2022   HDL 43.60 06/10/2022   LDLDIRECT 142.0 06/21/2019   LDLCALC 43 06/10/2022   ALT 31 06/10/2022   AST 38 (H) 06/10/2022   NA 134 (L) 06/10/2022   K 4.0 06/10/2022   CL 99 06/10/2022   CREATININE 0.90 06/10/2022   BUN 12 06/10/2022   CO2 26 06/10/2022   TSH 1.89 05/25/2021   PSA 0.43 01/04/2018   INR 1.1 (H) 04/28/2022   HGBA1C 10.5 (H) 06/10/2022   MICROALBUR 0.9 06/10/2022    MR Abdomen W Wo Contrast  Result Date: 06/15/2022 CLINICAL DATA:  History of cirrhosis, evaluate incidental liver lesion identified by ultrasound EXAM: MRI ABDOMEN WITHOUT AND WITH CONTRAST TECHNIQUE: Multiplanar multisequence MR imaging of the abdomen was performed both before and after the administration of intravenous contrast. CONTRAST:  56m GADAVIST GADOBUTROL 1 MMOL/ML IV SOLN COMPARISON:  Right upper quadrant ultrasound, 05/19/2022 FINDINGS: Lower chest: No acute findings. Hepatobiliary: Mild hepatic steatosis. Coarse, nodular cirrhotic morphology of the liver with hypertrophy of the caudate. Fluid signal, nonenhancing simple cyst of the inferior right lobe of the liver, hepatic segment VI, measuring 1.1 cm corresponding to finding of prior ultrasound (series 13, image 35). Additional subcentimeter fluid signal cysts in the right lobe of the liver. Focal arterially hyperenhancing lesion of the posterior right lobe of the liver, hepatic segment VI, measuring 0.8 cm (series 13, image 29). No evidence of washout or capsular enhancement. No mass or other parenchymal abnormality identified. No gallstones. No biliary ductal dilatation. Pancreas: No mass, inflammatory changes, or other parenchymal abnormality identified.No pancreatic ductal dilatation. Spleen:  Splenomegaly, maximum coronal span 15.8 cm. Adrenals/Urinary Tract: Normal adrenal glands. No renal masses or suspicious contrast enhancement  identified.  No evidence of hydronephrosis. Stomach/Bowel: Visualized portions within the abdomen are unremarkable. Moderate burden of stool throughout the colon. Vascular/Lymphatic: No pathologically enlarged lymph nodes identified. No abdominal aortic aneurysm demonstrated. Other:  None. Musculoskeletal: No suspicious osseous lesions identified. IMPRESSION: 1. Fluid signal, nonenhancing simple cyst of the inferior right lobe of the liver, hepatic segment VI, measuring 1.1 cm corresponding to finding of prior ultrasound. No specific further follow-up or characterization is required for this benign cyst. 2. Focal arterially hyperenhancing lesion of the posterior right lobe of the liver, hepatic segment VI, measuring 0.8 cm. No evidence of washout or capsular enhancement. This is of intermediate suspicion for hepatocellular carcinoma the setting of cirrhosis. LI-RADS category 3. Recommend follow-up MRI in 6 months to assess for stability. 3. Cirrhosis and splenomegaly. 4. Hepatic steatosis. Electronically Signed   By: Delanna Ahmadi M.D.   On: 06/15/2022 14:07       Assessment & Plan:   Problem List Items Addressed This Visit     Aortic atherosclerosis ([place])    Continue lipitor.       Chronic back pain    Followed by pain clinic.  On oxycodone.  Discussed the need to keep bowel moving.  Discussed bowel regimen.        Cirrhosis of liver ([place])    MRI as outlined.  Recent liver panel AST 38.  Remainder of liver panel wnl.  Discussed fatty liver.  Discussed the need to get sugars under better control.  Does not drink alcohol.  Refer to GI for further w/up and evaluation.       Relevant Orders   Ambulatory referral to Gastroenterology   GERD (gastroesophageal reflux disease)    On protonix.  Follow.       Hypercholesterolemia    On lipitor.  Low cholesterol diet and exercise.  Follow lipid panel and liver function tests.        Hypertension    Blood pressure doing well.  Continue triam/hctz.   Follow pressures.  Follow metabolic panel.       Liver lesion    Found on recent MRI.  Refer to GI for further evaluation and w/up.       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  Discussed diet and exercise.  Not taking metformin.  Discussed the need to take regularly.  Start bid dosing.  Does not check blood sugars.  Discussed CGM.  Cost is an issue.  Referral for assistance.  Follow met b and a1c.  Did agree to referral to [facility] for diabetes education and diet instruction.       Relevant Orders   AMB Referral to [place]   Ambulatory referral to diabetic education     Cameron Pheasant, MD

## 2022-06-16 NOTE — Progress Notes (Signed)
Seen in Office.

## 2022-06-18 ENCOUNTER — Encounter: Payer: Self-pay | Admitting: Pharmacist

## 2022-06-18 NOTE — Progress Notes (Signed)
Cameron Sanchez [facility])  [place] Team    06/18/2022  MEHMET SCALLY [birth year] [id number]  Reason for referral:  Assistance with getting a CGM   Referral source: Dr. Nicki Reaper Current insurance:  [employer]  PMHx includes but not limited to:  DMT2  Objective: The ASCVD Risk score (Arnett DK, et al., 2019) failed to calculate for the following reasons:   The valid total cholesterol range is 130 to 320 mg/dL  Lab Results  Component Value Date   CREATININE 0.90 06/10/2022   CREATININE 0.85 02/26/2022   CREATININE 0.89 07/08/2021    Lab Results  Component Value Date   HGBA1C 10.5 (H) 06/10/2022    Lipid Panel     Component Value Date/Time   CHOL 122 06/10/2022 0846   TRIG 179.0 (H) 06/10/2022 0846   HDL 43.60 06/10/2022 0846   CHOLHDL 3 06/10/2022 0846   VLDL 35.8 06/10/2022 0846   LDLCALC 43 06/10/2022 0846   LDLDIRECT 142.0 06/21/2019 0949    BP Readings from Last 3 Encounters:  06/16/22 130/80  04/28/22 118/72  03/11/22 (!) 144/81    No Known Allergies  Medications Reviewed Today     Reviewed by Einar Pheasant, MD (Physician) on 05/03/22 at [place] List Status: <None>   Medication Order Taking? Sig Documenting Provider Last Dose Status Informant  allopurinol (ZYLOPRIM) 300 MG tablet [id number] Yes TAKE 1 TABLET BY MOUTH EVERY DAY Einar Pheasant, MD Taking Active   atorvastatin (LIPITOR) 40 MG tablet [id number] Yes TAKE 1 TABLET BY MOUTH EVERY DAY  Patient taking differently: Take 40 mg by mouth daily.   Einar Pheasant, MD Taking Active Self  metaxalone [facility]) 800 MG tablet [id number] Yes Take 800 mg by mouth 3 (three) times daily. [provider] Taking Active   metFORMIN (GLUCOPHAGE) 500 MG tablet [id number] Yes Take 1 tablet (500 mg total) by mouth 2 (two) times daily with a meal. Einar Pheasant, MD Taking Active   metoprolol succinate (TOPROL-XL) 50 MG 24 hr tablet [id number] Yes TAKE 2 TABLETS BY MOUTH DAILY WITH OR  IMMEDIATELY FOLLOWING A MEAL. Einar Pheasant, MD Taking Active   oxyCODONE (ROXICODONE) 15 MG immediate release tablet [id number] Yes Take 15 mg by mouth every 4 (four) hours. [provider] Taking Active   pantoprazole (PROTONIX) 40 MG tablet [id number]  Take 1 tablet (40 mg total) by mouth 2 (two) times daily before a meal. Einar Pheasant, MD  Active   triamterene-hydrochlorothiazide (MAXZIDE-25) 37.5-25 MG tablet [id number] Yes TAKE 1 TABLET BY MOUTH EVERY DAY Einar Pheasant, MD Taking Active             Assessment: Patient is covered by Traditional Medicare. At this time, Traditional Medicare will not cover CGM for patients unless the patient is on at least 1 dose of insulin daily OR has a medical exception; which is defined as documentation in the medical record that the patient has a history of level 2 or level 3 hypoglycemic event.   Alternative options for this patient are to pay cash price for a CGM or use a goodRX coupon.   Plan: Will route note to Dr. Nicki Reaper.  Will close [facility] pharmacy case as no further medication needs identified at this time.  Am happy to assist in the future as needed.    Loretha Brasil, PharmD [place] Pharmacist Office: [phone]

## 2022-06-19 NOTE — Progress Notes (Signed)
Please notify - of attached information.  Let him know that good rx sounds like the only option.  Tell him to let us now if this is possible for him.  If not possible, then will need to check sugars with glucometer.  If he has one, need to know what kind so we can send in rx for  test strips.

## 2022-06-21 ENCOUNTER — Encounter: Payer: Self-pay | Admitting: Internal Medicine

## 2022-06-21 DIAGNOSIS — K769 Liver disease, unspecified: Secondary | ICD-10-CM | POA: Insufficient documentation

## 2022-06-21 NOTE — Assessment & Plan Note (Signed)
MRI as outlined.  Recent liver panel AST 38.  Remainder of liver panel wnl.  Discussed fatty liver.  Discussed the need to get sugars under better control.  Does not drink alcohol.  Refer to GI for further w/up and evaluation.

## 2022-06-21 NOTE — Assessment & Plan Note (Signed)
Continue lipitor  ?

## 2022-06-21 NOTE — Assessment & Plan Note (Signed)
Followed by pain clinic.  On oxycodone.  Discussed the need to keep bowel moving.  Discussed bowel regimen.   

## 2022-06-21 NOTE — Assessment & Plan Note (Signed)
Found on recent MRI.  Refer to GI for further evaluation and w/up.

## 2022-06-21 NOTE — Assessment & Plan Note (Addendum)
Sugars elevated as outlined.  Discussed diet and exercise.  Not taking metformin.  Discussed the need to take regularly.  Start bid dosing.  Does not check blood sugars.  Discussed CGM.  Cost is an issue.  Referral for assistance.  Follow met b and a1c.  Did agree to referral to [facility] for diabetes education and diet instruction.

## 2022-06-21 NOTE — Assessment & Plan Note (Signed)
On lipitor.  Low cholesterol diet and exercise.  Follow lipid panel and liver function tests.   

## 2022-06-21 NOTE — Assessment & Plan Note (Signed)
On protonix.  Follow.  

## 2022-06-21 NOTE — Assessment & Plan Note (Signed)
Blood pressure doing well.  Continue triam/hctz.  Follow pressures.  Follow metabolic panel.

## 2022-06-25 NOTE — Progress Notes (Signed)
Lm for Shannon to cb

## 2022-06-29 DIAGNOSIS — G894 Chronic pain syndrome: Secondary | ICD-10-CM | POA: Diagnosis not present

## 2022-06-29 DIAGNOSIS — M961 Postlaminectomy syndrome, not elsewhere classified: Secondary | ICD-10-CM | POA: Diagnosis not present

## 2022-06-29 DIAGNOSIS — M545 Low back pain, unspecified: Secondary | ICD-10-CM | POA: Diagnosis not present

## 2022-06-29 DIAGNOSIS — M5137 Other intervertebral disc degeneration, lumbosacral region: Secondary | ICD-10-CM | POA: Diagnosis not present

## 2022-06-29 DIAGNOSIS — Z79891 Long term (current) use of opiate analgesic: Secondary | ICD-10-CM | POA: Diagnosis not present

## 2022-06-29 NOTE — Progress Notes (Signed)
Lm for shannon to cb

## 2022-07-09 ENCOUNTER — Encounter: Payer: Self-pay | Admitting: *Deleted

## 2022-07-09 NOTE — Progress Notes (Signed)
Sent mychart message

## 2022-07-14 ENCOUNTER — Ambulatory Visit (INDEPENDENT_AMBULATORY_CARE_PROVIDER_SITE_OTHER): Payer: Medicare Other | Admitting: Internal Medicine

## 2022-07-14 ENCOUNTER — Encounter: Payer: Self-pay | Admitting: Internal Medicine

## 2022-07-14 VITALS — BP 110/60 | HR 69 | Temp 98.3°F | Resp 14 | Ht 67.0 in | Wt 218.4 lb

## 2022-07-14 DIAGNOSIS — G8929 Other chronic pain: Secondary | ICD-10-CM

## 2022-07-14 DIAGNOSIS — E1149 Type 2 diabetes mellitus with other diabetic neurological complication: Secondary | ICD-10-CM

## 2022-07-14 DIAGNOSIS — K219 Gastro-esophageal reflux disease without esophagitis: Secondary | ICD-10-CM

## 2022-07-14 DIAGNOSIS — D649 Anemia, unspecified: Secondary | ICD-10-CM

## 2022-07-14 DIAGNOSIS — K769 Liver disease, unspecified: Secondary | ICD-10-CM

## 2022-07-14 DIAGNOSIS — E7800 Pure hypercholesterolemia, unspecified: Secondary | ICD-10-CM

## 2022-07-14 DIAGNOSIS — K7460 Unspecified cirrhosis of liver: Secondary | ICD-10-CM | POA: Diagnosis not present

## 2022-07-14 DIAGNOSIS — K5900 Constipation, unspecified: Secondary | ICD-10-CM | POA: Diagnosis not present

## 2022-07-14 DIAGNOSIS — I10 Essential (primary) hypertension: Secondary | ICD-10-CM

## 2022-07-14 DIAGNOSIS — I700 Atherosclerosis of aorta: Secondary | ICD-10-CM | POA: Diagnosis not present

## 2022-07-14 DIAGNOSIS — M545 Low back pain, unspecified: Secondary | ICD-10-CM

## 2022-07-14 LAB — HM DIABETES FOOT EXAM

## 2022-07-14 MED ORDER — NYSTATIN 100000 UNIT/ML MT SUSP: OROMUCOSAL | 0 refills | Status: DC

## 2022-07-14 MED ORDER — FREESTYLE LIBRE 3 SENSOR MISC: 3 refills | Status: DC

## 2022-07-14 NOTE — Progress Notes (Signed)
[name], male   DOB: 11-17-1970, 51 y.o.   MRN: [id number]   Subjective:    [name], male    DOB: Sep 14, 1971, 51 y.o.   MRN: [id number]   Patient here for  Chief Complaint  Patient presents with   Follow-up    4 wks, disc MRI results, spots found in liver.    Marland Kitchen   HPI Here to follow up regarding his diabetes, blood pressure and cholesterol.  Also had questions about his recent MRI.  Discussed.  Have referred to GI for further evaluation.  Discussed recent labs.  A1c increased 10.5.  Has not been taking metformin regularly (previously).  Discussed low carb diet, exercise and need for weight loss.  Discussed the need for better sugar control to help prevent further liver issues.  No chest pain.  Breathing stable.  No acid reflux.  Still issues with constipation.  Tongue irritation.     Past Medical History:  Diagnosis Date   Allergy    Chronic back pain    Diabetes mellitus without complication ([place])    Hypercholesterolemia    Hypertension    Migraines    Past Surgical History:  Procedure Laterality Date   BACK SURGERY  08/12/14   multiple   CHOLECYSTECTOMY     COLONOSCOPY WITH PROPOFOL N/A 03/11/2022   Procedure: COLONOSCOPY WITH PROPOFOL;  Surgeon: Cameron Bellows, MD;  Location: [facility] ENDOSCOPY;  Service: Gastroenterology;  Laterality: N/A;   ESOPHAGOGASTRODUODENOSCOPY N/A 03/11/2022   Procedure: ESOPHAGOGASTRODUODENOSCOPY (EGD);  Surgeon: Cameron Bellows, MD;  Location: [facility] ENDOSCOPY;  Service: Gastroenterology;  Laterality: N/A;   KNEE ARTHROSCOPY     Dr HBK   Family History  Problem Relation Age of Onset   Cancer Father        germ cell (retroperitoneal)   Coronary artery disease Other        s/p stent (age 65)   Hypertension Paternal Grandfather    Kidney cancer Paternal Grandfather    Diabetes Paternal Grandfather    Diabetes Maternal Grandfather    Heart disease Cousin        MI - age 54   Social History   Socioeconomic History    Marital status: Married    Spouse name: Not on file   Number of children: 2   Years of education: 12   Highest education level: Not on file  Occupational History   Occupation: disabled    Employer: [employer]  Tobacco Use   Smoking status: Never   Smokeless tobacco: Never  Vaping Use   Vaping Use: Never used  Substance and Sexual Activity   Alcohol use: No    Alcohol/week: 0.0 standard drinks of alcohol   Drug use: No   Sexual activity: Not on file  Other Topics Concern   Not on file  Social History Narrative   Lives with girlfriend in a one story home.  Has 2 sons.  Trying to get disability due to several back issues.  Education: high school.    Social Determinants of Health   Financial Resource Strain: Low Risk  (02/24/2022)   Overall Financial Resource Strain (CARDIA)    Difficulty of Paying Living Expenses: Not hard at all  Food Insecurity: No Food Insecurity (02/24/2022)   Hunger Vital Sign    Worried About Running Out of Food in the Last Year: Never true    Ran Out of Food in the Last Year: Never true  Transportation Needs: No Transportation  Needs (02/24/2022)   PRAPARE - Hydrologist (Medical): No    Lack of Transportation (Non-Medical): No  Physical Activity: Not on file  Stress: No Stress Concern Present (02/24/2022)   [place]    Feeling of Stress : Not at all  Social Connections: Unknown (02/24/2022)   Social Connection and Isolation Panel [NHANES]    Frequency of Communication with Friends and Family: Not on file    Frequency of Social Gatherings with Friends and Family: Not on file    Attends Religious Services: Not on file    Active Member of Clubs or Organizations: Not on file    Attends Archivist Meetings: Not on file    Marital Status: Married     Review of Systems  Constitutional:  Negative for appetite change and unexpected weight  change.  HENT:  Negative for congestion and sinus pressure.        Tongue irritation as outlined.   Respiratory:  Negative for cough, chest tightness and shortness of breath.   Cardiovascular:  Negative for chest pain, palpitations and leg swelling.  Gastrointestinal:  Positive for constipation. Negative for diarrhea, nausea and vomiting.  Genitourinary:  Negative for difficulty urinating and dysuria.  Musculoskeletal:  Positive for back pain. Negative for joint swelling and myalgias.  Skin:  Negative for color change and rash.  Neurological:  Negative for dizziness, light-headedness and headaches.  Psychiatric/Behavioral:  Negative for agitation and dysphoric mood.        Objective:     BP 110/60 (BP Location: Left Arm, Patient Position: Sitting, Cuff Size: Normal)   Pulse 69   Temp 98.3 F (36.8 C) (Oral)   Resp 14   Ht 5' 7"  (1.702 m)   Wt 218 lb 6.4 oz (99.1 kg)   SpO2 97%   BMI 34.21 kg/m  Wt Readings from Last 3 Encounters:  07/14/22 218 lb 6.4 oz (99.1 kg)  06/16/22 221 lb 6.4 oz (100.4 kg)  04/28/22 224 lb 3.2 oz (101.7 kg)    Physical Exam Vitals reviewed.  Constitutional:      General: He is not in acute distress.    Appearance: Normal appearance. He is well-developed.  HENT:     Head: Normocephalic and atraumatic.     Right Ear: External ear normal.     Left Ear: External ear normal.  Eyes:     General: No scleral icterus.       Right eye: No discharge.        Left eye: No discharge.     Conjunctiva/sclera: Conjunctivae normal.  Cardiovascular:     Rate and Rhythm: Normal rate and regular rhythm.  Pulmonary:     Effort: Pulmonary effort is normal. No respiratory distress.     Breath sounds: Normal breath sounds.  Abdominal:     General: Bowel sounds are normal.     Palpations: Abdomen is soft.     Tenderness: There is no abdominal tenderness.  Musculoskeletal:        General: No swelling or tenderness.     Cervical back: Neck supple. No tenderness.   Lymphadenopathy:     Cervical: No cervical adenopathy.  Skin:    Findings: No erythema or rash.  Neurological:     Mental Status: He is alert.  Psychiatric:        Mood and Affect: Mood normal.        Behavior: Behavior normal.  Outpatient Encounter Medications as of 07/14/2022  Medication Sig   allopurinol (ZYLOPRIM) 300 MG tablet TAKE 1 TABLET BY MOUTH EVERY DAY   atorvastatin (LIPITOR) 40 MG tablet TAKE 1 TABLET BY MOUTH EVERY DAY (Patient taking differently: Take 40 mg by mouth daily.)   Continuous Blood Gluc Sensor (FREESTYLE LIBRE 3 SENSOR) MISC Place 1 sensor on the skin every 14 days. Use to check glucose continuously   metaxalone (SKELAXIN) 800 MG tablet Take 800 mg by mouth 3 (three) times daily.   metFORMIN (GLUCOPHAGE) 500 MG tablet Take 1 tablet (500 mg total) by mouth 2 (two) times daily with a meal.   metoprolol succinate (TOPROL-XL) 50 MG 24 hr tablet TAKE 2 TABLETS BY MOUTH DAILY WITH OR IMMEDIATELY FOLLOWING A MEAL.   nystatin (MYCOSTATIN) 100000 UNIT/ML suspension 5cc's swish and spit tid   oxyCODONE (ROXICODONE) 15 MG immediate release tablet Take 15 mg by mouth every 4 (four) hours.   pantoprazole (PROTONIX) 40 MG tablet Take 1 tablet (40 mg total) by mouth 2 (two) times daily before a meal.   triamterene-hydrochlorothiazide (MAXZIDE-25) 37.5-25 MG tablet TAKE 1 TABLET BY MOUTH EVERY DAY   [DISCONTINUED] LORazepam (ATIVAN) 0.5 MG tablet Take 1/2 tablet prior to procedure. (Patient not taking: Reported on 07/14/2022)   No facility-administered encounter medications on file as of 07/14/2022.     Lab Results  Component Value Date   WBC 7.3 06/10/2022   HGB 12.2 (L) 06/10/2022   HCT 37.4 (L) 06/10/2022   PLT 138.0 (L) 06/10/2022   GLUCOSE 286 (H) 06/10/2022   CHOL 122 06/10/2022   TRIG 179.0 (H) 06/10/2022   HDL 43.60 06/10/2022   LDLDIRECT 142.0 06/21/2019   LDLCALC 43 06/10/2022   ALT 31 06/10/2022   AST 38 (H) 06/10/2022   NA 134 (L) 06/10/2022   K  4.0 06/10/2022   CL 99 06/10/2022   CREATININE 0.90 06/10/2022   BUN 12 06/10/2022   CO2 26 06/10/2022   TSH 1.89 05/25/2021   PSA 0.43 01/04/2018   INR 1.1 (H) 04/28/2022   HGBA1C 10.5 (H) 06/10/2022   MICROALBUR 0.9 06/10/2022    MR Abdomen W Wo Contrast  Result Date: 06/15/2022 CLINICAL DATA:  History of cirrhosis, evaluate incidental liver lesion identified by ultrasound EXAM: MRI ABDOMEN WITHOUT AND WITH CONTRAST TECHNIQUE: Multiplanar multisequence MR imaging of the abdomen was performed both before and after the administration of intravenous contrast. CONTRAST:  25m GADAVIST GADOBUTROL 1 MMOL/ML IV SOLN COMPARISON:  Right upper quadrant ultrasound, 05/19/2022 FINDINGS: Lower chest: No acute findings. Hepatobiliary: Mild hepatic steatosis. Coarse, nodular cirrhotic morphology of the liver with hypertrophy of the caudate. Fluid signal, nonenhancing simple cyst of the inferior right lobe of the liver, hepatic segment VI, measuring 1.1 cm corresponding to finding of prior ultrasound (series 13, image 35). Additional subcentimeter fluid signal cysts in the right lobe of the liver. Focal arterially hyperenhancing lesion of the posterior right lobe of the liver, hepatic segment VI, measuring 0.8 cm (series 13, image 29). No evidence of washout or capsular enhancement. No mass or other parenchymal abnormality identified. No gallstones. No biliary ductal dilatation. Pancreas: No mass, inflammatory changes, or other parenchymal abnormality identified.No pancreatic ductal dilatation. Spleen:  Splenomegaly, maximum coronal span 15.8 cm. Adrenals/Urinary Tract: Normal adrenal glands. No renal masses or suspicious contrast enhancement identified. No evidence of hydronephrosis. Stomach/Bowel: Visualized portions within the abdomen are unremarkable. Moderate burden of stool throughout the colon. Vascular/Lymphatic: No pathologically enlarged lymph nodes identified. No abdominal aortic aneurysm demonstrated.  Other:  None. Musculoskeletal: No suspicious osseous lesions identified. IMPRESSION: 1. Fluid signal, nonenhancing simple cyst of the inferior right lobe of the liver, hepatic segment VI, measuring 1.1 cm corresponding to finding of prior ultrasound. No specific further follow-up or characterization is required for this benign cyst. 2. Focal arterially hyperenhancing lesion of the posterior right lobe of the liver, hepatic segment VI, measuring 0.8 cm. No evidence of washout or capsular enhancement. This is of intermediate suspicion for hepatocellular carcinoma the setting of cirrhosis. LI-RADS category 3. Recommend follow-up MRI in 6 months to assess for stability. 3. Cirrhosis and splenomegaly. 4. Hepatic steatosis. Electronically Signed   By: Delanna Ahmadi M.D.   On: 06/15/2022 14:07       Assessment & Plan:   Problem List Items Addressed This Visit     Anemia - Primary   Relevant Orders   CBC with Differential/Platelet   IBC + Ferritin   Aortic atherosclerosis (HCC)    Continue lipitor.       Chronic back pain    Followed by pain clinic.  On oxycodone.  Discussed the need to keep bowel moving.  Discussed bowel regimen.        Cirrhosis of liver ([place])    MRI as outlined.  Recent liver panel AST 38.  Remainder of liver panel wnl.  Discussed fatty liver.  Discussed the need to get sugars under better control.  Does not drink alcohol.  Refer to GI for further w/up and evaluation - cirrhosis and liver lesion.       Relevant Orders   CBC with Differential/Platelet   Hepatic function panel   Protime-INR   Constipation    Takes narcotic pain medication. Have discussed bowel regimen - to keep bowels moving.  Saw GI.  Colonoscopy 03/11/22 - Four 2 to 3 mm polyps in the cecum, removed with a cold biopsy forceps. Resected and retrieved. Two 4 to 6 mm polyps in the cecum, removed with a cold snare. Resected and retrieved. Three 5 to 7 mm polyps in the ascending colon, removed with a cold snare.  Resected and retrieved. One 15 mm polyp in the ascending colon, removed with mucosal resection. Resected and retrieved. Clip was placed. The examination was otherwise normal on direct and retroflexion views. Mucosal resection was performed. Resection and retrieval were complete. Continue f/u with GI.  Recommended f/u colonoscopy 6-8 months.       GERD (gastroesophageal reflux disease)    On protonix.  Follow.       Hypercholesterolemia    On lipitor.  Low cholesterol diet and exercise.  Follow lipid panel and liver function tests.        Relevant Orders   Lipid panel   TSH   Hypertension    Blood pressure doing well.  Continue triam/hctz and metoprolol.  Follow pressures.  Follow metabolic panel.       Relevant Orders   Basic metabolic panel   Liver lesion    Found on recent MRI.  Discussed further evaluation.  Discussed recommendation for GI f/u for further w/up and evaluation.        Type 2 diabetes mellitus with neurological complications (HCC)    Sugars elevated as outlined.  Discussed diet and exercise.  Previously not taking metformin.  Discussed the need to take regularly bid dosing.  Does not check blood sugars.  Discussed CGM.  Cost is an issue.  Referral for assistance.  Follow met b and a1c.  Did agree to referral to  [facility] for diabetes education and diet instruction. Send in sugar readings over the next couple of weeks.  Will need to adjust medication.  Consider insulin - long acting.       Relevant Orders   Hemoglobin A1c     Einar Pheasant, MD

## 2022-07-25 ENCOUNTER — Encounter: Payer: Self-pay | Admitting: Internal Medicine

## 2022-07-25 NOTE — Assessment & Plan Note (Addendum)
Takes narcotic pain medication. Have discussed bowel regimen - to keep bowels moving.  Saw GI.  Colonoscopy 03/11/22 - Four 2 to 3 mm polyps in the cecum, removed with a cold biopsy forceps. Resected and retrieved. Two 4 to 6 mm polyps in the cecum, removed with a cold snare. Resected and retrieved. Three 5 to 7 mm polyps in the ascending colon, removed with a cold snare. Resected and retrieved. One 15 mm polyp in the ascending colon, removed with mucosal resection. Resected and retrieved. Clip was placed. The examination was otherwise normal on direct and retroflexion views. Mucosal resection was performed. Resection and retrieval were complete. Continue f/u with GI.  Recommended f/u colonoscopy 6-8 months.

## 2022-07-25 NOTE — Assessment & Plan Note (Signed)
On lipitor.  Low cholesterol diet and exercise.  Follow lipid panel and liver function tests.   

## 2022-07-25 NOTE — Assessment & Plan Note (Signed)
MRI as outlined.  Recent liver panel AST 38.  Remainder of liver panel wnl.  Discussed fatty liver.  Discussed the need to get sugars under better control.  Does not drink alcohol.  Refer to GI for further w/up and evaluation - cirrhosis and liver lesion.

## 2022-07-25 NOTE — Assessment & Plan Note (Addendum)
Blood pressure doing well.  Continue triam/hctz and metoprolol.  Follow pressures.  Follow metabolic panel.  

## 2022-07-25 NOTE — Assessment & Plan Note (Signed)
Found on recent MRI.  Discussed further evaluation.  Discussed recommendation for GI f/u for further w/up and evaluation.

## 2022-07-25 NOTE — Assessment & Plan Note (Addendum)
Sugars elevated as outlined.  Discussed diet and exercise.  Previously not taking metformin.  Discussed the need to take regularly bid dosing.  Does not check blood sugars.  Discussed CGM.  Cost is an issue.  Referral for assistance.  Follow met b and a1c.  Did agree to referral to [facility] for diabetes education and diet instruction. Send in sugar readings over the next couple of weeks.  Will need to adjust medication.  Consider insulin - long acting.

## 2022-07-25 NOTE — Assessment & Plan Note (Signed)
Followed by pain clinic.  On oxycodone.  Discussed the need to keep bowel moving.  Discussed bowel regimen.   

## 2022-07-25 NOTE — Assessment & Plan Note (Signed)
On protonix.  Follow.  

## 2022-07-25 NOTE — Assessment & Plan Note (Signed)
Continue lipitor  ?

## 2022-07-29 DIAGNOSIS — M545 Low back pain, unspecified: Secondary | ICD-10-CM | POA: Diagnosis not present

## 2022-07-29 DIAGNOSIS — M5137 Other intervertebral disc degeneration, lumbosacral region: Secondary | ICD-10-CM | POA: Diagnosis not present

## 2022-07-29 DIAGNOSIS — Z79891 Long term (current) use of opiate analgesic: Secondary | ICD-10-CM | POA: Diagnosis not present

## 2022-07-29 DIAGNOSIS — G894 Chronic pain syndrome: Secondary | ICD-10-CM | POA: Diagnosis not present

## 2022-07-29 DIAGNOSIS — M961 Postlaminectomy syndrome, not elsewhere classified: Secondary | ICD-10-CM | POA: Diagnosis not present

## 2022-08-06 NOTE — Progress Notes (Signed)
See LOV note, CGM has been discussed with patient per notes. Cost is an issue

## 2022-08-23 ENCOUNTER — Other Ambulatory Visit: Payer: Self-pay | Admitting: Internal Medicine

## 2022-08-26 DIAGNOSIS — G894 Chronic pain syndrome: Secondary | ICD-10-CM | POA: Diagnosis not present

## 2022-08-26 DIAGNOSIS — Z79891 Long term (current) use of opiate analgesic: Secondary | ICD-10-CM | POA: Diagnosis not present

## 2022-08-26 DIAGNOSIS — M545 Low back pain, unspecified: Secondary | ICD-10-CM | POA: Diagnosis not present

## 2022-08-26 DIAGNOSIS — M5137 Other intervertebral disc degeneration, lumbosacral region: Secondary | ICD-10-CM | POA: Diagnosis not present

## 2022-08-26 DIAGNOSIS — M961 Postlaminectomy syndrome, not elsewhere classified: Secondary | ICD-10-CM | POA: Diagnosis not present

## 2022-08-30 ENCOUNTER — Other Ambulatory Visit: Payer: Self-pay

## 2022-08-30 ENCOUNTER — Ambulatory Visit: Payer: Medicare Other | Admitting: Internal Medicine

## 2022-08-30 ENCOUNTER — Telehealth: Payer: Self-pay | Admitting: Internal Medicine

## 2022-08-30 ENCOUNTER — Encounter: Payer: Self-pay | Admitting: Emergency Medicine

## 2022-08-30 ENCOUNTER — Emergency Department
Admission: EM | Admit: 2022-08-30 | Discharge: 2022-08-30 | Disposition: A | Discharge status: Home / Self Care | Payer: Medicare Other | Attending: Emergency Medicine | Admitting: Emergency Medicine

## 2022-08-30 DIAGNOSIS — R197 Diarrhea, unspecified: Secondary | ICD-10-CM | POA: Insufficient documentation

## 2022-08-30 DIAGNOSIS — R739 Hyperglycemia, unspecified: Secondary | ICD-10-CM | POA: Insufficient documentation

## 2022-08-30 DIAGNOSIS — K649 Unspecified hemorrhoids: Secondary | ICD-10-CM | POA: Diagnosis not present

## 2022-08-30 DIAGNOSIS — R110 Nausea: Secondary | ICD-10-CM | POA: Diagnosis not present

## 2022-08-30 DIAGNOSIS — E1165 Type 2 diabetes mellitus with hyperglycemia: Secondary | ICD-10-CM | POA: Diagnosis not present

## 2022-08-30 DIAGNOSIS — K625 Hemorrhage of anus and rectum: Secondary | ICD-10-CM | POA: Diagnosis present

## 2022-08-30 LAB — COMPREHENSIVE METABOLIC PANEL
ALT: 24 U/L (ref 0–44)
AST: 31 U/L (ref 15–41)
Albumin: 4.5 g/dL (ref 3.5–5.0)
Alkaline Phosphatase: 89 U/L (ref 38–126)
Anion gap: 11 (ref 5–15)
BUN: 13 mg/dL (ref 6–20)
CO2: 24 mmol/L (ref 22–32)
Calcium: 9.3 mg/dL (ref 8.9–10.3)
Chloride: 95 mmol/L — ABNORMAL LOW (ref 98–111)
Creatinine, Ser: 1.13 mg/dL (ref 0.61–1.24)
GFR, Estimated: >60 mL/min (ref >60)
Glucose, Bld: 481 mg/dL — ABNORMAL HIGH (ref 70–99)
Potassium: 3.8 mmol/L (ref 3.5–5.1)
Sodium: 130 mmol/L — ABNORMAL LOW (ref 135–145)
Total Bilirubin: 1 mg/dL (ref 0.3–1.2)
Total Protein: 8 g/dL (ref 6.5–8.1)

## 2022-08-30 LAB — CBC
HCT: 43.4 % (ref 39.0–52.0)
Hemoglobin: 13.6 g/dL (ref 13.0–17.0)
MCH: 24.4 pg — ABNORMAL LOW (ref 26.0–34.0)
MCHC: 31.3 g/dL (ref 30.0–36.0)
MCV: 77.9 fL — ABNORMAL LOW (ref 80.0–100.0)
Platelets: 149 10*3/uL — ABNORMAL LOW (ref 150–400)
RBC: 5.57 MIL/uL (ref 4.22–5.81)
RDW: 14.4 % (ref 11.5–15.5)
WBC: 7.8 10*3/uL (ref 4.0–10.5)
nRBC: 0 % (ref 0.0–0.2)

## 2022-08-30 LAB — TYPE AND SCREEN
ABO/RH(D): A POS
Antibody Screen: NEGATIVE

## 2022-08-30 NOTE — ED Provider Triage Note (Addendum)
Emergency Medicine Provider Triage Evaluation Note  TAVIS KRING , a 51 y.o. male  was evaluated in triage.  Patient complains of rectal pain and bleeding ongoing since Thursday. He had been out of his oxycodone and believes he went into withdrawal causing vomiting and diarrhea. He believes something "ripped inside" in the rectal area. He has noted bright red blood from the rectum since.  Physical Exam  There were no vitals taken for this visit. Gen:   Awake, no distress   Resp:  Normal effort  MSK:   Moves extremities without difficulty  Other:    Medical Decision Making  Medically screening exam initiated at 1:37 PM.  Appropriate orders placed.  GUY SEESE was informed that the remainder of the evaluation will be completed by another provider, this initial triage assessment does not replace that evaluation, and the importance of remaining in the ED until their evaluation is complete.   Victorino Dike, FNP 08/30/22 [place], [place], FNP 08/30/22 1341

## 2022-08-30 NOTE — Telephone Encounter (Signed)
The patient called stated he is having rectal bleeding . He stated he thinks its hemorrhoids due to straining when he has a bowel movement. Patient was sent to access nurse. I have scheduled the patient with Dr. Silvio Pate at [place] at 4:00.

## 2022-08-30 NOTE — ED Triage Notes (Signed)
Pt to ED via POV for rectal pain and bleeding. Pt states that this has been going on since Thursday. Pt states that he is not having abdominal pain. Pt reports thinking that he may have ripped something in his rectum. Pt is currently in NAD.

## 2022-08-30 NOTE — ED Notes (Signed)
Patient discharged to home per MD order. Patient in stable condition, and deemed medically cleared by ED provider for discharge. Discharge instructions reviewed with patient/family using "Teach Back"; verbalized understanding of medication education and administration, and information about follow-up care. Denies further concerns. ° °

## 2022-09-01 ENCOUNTER — Telehealth: Payer: Self-pay

## 2022-09-01 ENCOUNTER — Telehealth: Payer: Self-pay | Admitting: Internal Medicine

## 2022-09-01 NOTE — Telephone Encounter (Signed)
Sent acces nurse note already spoke w/ pt

## 2022-09-01 NOTE — Telephone Encounter (Signed)
Pt has been informed and has verbalized understanding. 

## 2022-09-01 NOTE — Telephone Encounter (Signed)
Pt would like to be called regarding a visit to the ER

## 2022-09-01 NOTE — Telephone Encounter (Signed)
Pt did in fact go to the ED for his rectal bleeding on 08/30/2022 he stated that his bleeding has gotten worst and its coming through his pants at this point.

## 2022-09-01 NOTE — Telephone Encounter (Signed)
Reviewed notes.  If persistent bleeding (coming through his pants, etc) - needs to be evaluated today.

## 2022-09-01 NOTE — Telephone Encounter (Signed)
See attached.  Needs evaluation today.

## 2022-09-04 ENCOUNTER — Other Ambulatory Visit: Payer: Self-pay | Admitting: Internal Medicine

## 2022-09-06 ENCOUNTER — Ambulatory Visit (INDEPENDENT_AMBULATORY_CARE_PROVIDER_SITE_OTHER): Payer: Medicare Other | Admitting: Surgery

## 2022-09-06 ENCOUNTER — Encounter: Payer: Self-pay | Admitting: Surgery

## 2022-09-06 VITALS — BP 135/78 | HR 88 | Temp 98.5°F | Ht 67.0 in | Wt 208.8 lb

## 2022-09-06 DIAGNOSIS — K602 Anal fissure, unspecified: Secondary | ICD-10-CM | POA: Diagnosis not present

## 2022-09-06 NOTE — Patient Instructions (Addendum)
Please pick up the Nifedipine at [facility] in [place].                                                  [address], [place], [place] [address]                                                  Open ? Closes 6?PM                                                   Delivery: 11?AM-4?PM  More hours                                                   Phone: [phone]   You may try Benefiber OTC or any fiber supplements to help with constipation.  If you have any concerns or questions, please feel free to call our office. See follow up appointment below.    Anal Fissure, Adult   An anal fissure is a small tear or crack in the tissue around the opening of the butt (anus). Bleeding from the tear or crack usually stops on its own within a few minutes. The bleeding may happen every time you poop (have a bowel movement) until the tear or crack heals. What are the causes? This condition is usually caused by passing a large or hard poop (stool). Other causes include: Trouble pooping (constipation). Passing watery poop (diarrhea). Inflammatory bowel disease (Crohn's disease or ulcerative colitis). Childbirth. Infections. Anal sex. What are the signs or symptoms? Symptoms of this condition include: Bleeding from the butt. Small amounts of blood on your poop. The blood coats the outside of the poop. It is not mixed with the poop. Small amounts of blood on the toilet paper or in the toilet after you poop. Pain when passing poop. Itching or irritation around the opening of the butt. How is this diagnosed? This condition may be diagnosed based on a physical exam. Your doctor may: Check your butt. A tear can often be seen by checking the area with care. Check your butt using a short tube (anoscope). The light in the tube will show any problems in your butt. How is this treated? Treatment for this condition may include: Treating problems that make it hard for you to pass poop. You may be told  to: Eat more fiber. Drink more fluid. Take fiber supplements. Take medicines that make poop soft. Taking warm water baths (sitz baths). This may help to heal the tear. Using creams and ointments. If your condition gets worse, other treatments may be needed such as: A shot near the tear or crack (botulinum injection). Surgery to repair the tear or crack. Follow these instructions at home: Eating and drinking  Avoid bananas and dairy products. These foods can make it hard to poop. Drink enough fluid to keep your pee (urine) pale yellow. Eat foods  that have a lot of fiber in them, such as: Beans. Whole grains. Fresh fruits. Fresh vegetables. General instructions  Take over-the-counter and prescription medicines only as told by your doctor. Use creams or ointments only as told by your doctor. Keep the butt area as clean and dry as you can. Take a warm water bath as told by your doctor. Do not use soap. Keep all follow-up visits as told by your doctor. This is important. Contact a doctor if: You have more bleeding. You have a fever. You have watery poop that is mixed with blood. You have pain. Your problem gets worse, not better. Summary An anal fissure is a small tear or crack in the skin around the opening of the butt (anus). This condition is usually caused by passing a large or hard poop (stool). Treatment includes treating the problems that make it hard for you to pass poop. Follow your doctor's instructions about caring for your condition at home. Keep all follow-up visits as told by your doctor. This is important. This information is not intended to replace advice given to you by your health care provider. Make sure you discuss any questions you have with your health care provider. Document Revised: 05/28/2021 Document Reviewed: 05/28/2021 Elsevier Patient Education  [place].

## 2022-09-07 NOTE — ED Provider Notes (Signed)
[facility] Provider Note    Event Date/Time   First MD Initiated Contact with Patient 08/30/22 1414     (approximate)   History   Rectal Bleeding   HPI  Cameron Sanchez is a 51 y.o. male who presents with complaints of rectal bleeding.  Patient reports he has been out of oxycodone and has had some nausea and diarrhea because of this because he thinks he is in withdrawal.  He has had frequent stools.  He is not on blood thinners.  No epigastric pain, no coffee-ground emesis.  No history of PUD     Physical Exam   Triage Vital Signs: ED Triage Vitals  Enc Vitals Group     BP 08/30/22 1338 133/82     Pulse Rate 08/30/22 1338 82     Resp 08/30/22 1338 16     Temp 08/30/22 1338 98.3 F (36.8 C)     Temp Source 08/30/22 1338 Oral     SpO2 08/30/22 1338 98 %     Weight --      Height --      Head Circumference --      Peak Flow --      Pain Score 08/30/22 1335 5     Pain Loc --      Pain Edu? --      Excl. in [place]? --     Most recent vital signs: Vitals:   08/30/22 1338  BP: 133/82  Pulse: 82  Resp: 16  Temp: 98.3 F (36.8 C)  SpO2: 98%     General: Awake, no distress.  CV:  Good peripheral perfusion.  Resp:  Normal effort.  Abd:  No distention.  Other:  Rectal exam: Large hemorrhoid with evidence of recent bleeding, not currently bleeding now.  Nontender   ED Results / Procedures / Treatments   Labs (all labs ordered are listed, but only abnormal results are displayed) Labs Reviewed  COMPREHENSIVE METABOLIC PANEL - Abnormal; Notable for the following components:      Result Value   Sodium 130 (*)    Chloride 95 (*)    Glucose, Bld 481 (*)    All other components within normal limits  CBC - Abnormal; Notable for the following components:   MCV 77.9 (*)    MCH 24.4 (*)    Platelets 149 (*)    All other components within normal limits  TYPE AND SCREEN     EKG     RADIOLOGY     PROCEDURES:  Critical Care  performed:   Procedures   MEDICATIONS ORDERED IN ED: Medications - No data to display   IMPRESSION / MDM / [place] / ED COURSE  I reviewed the triage vital signs and the nursing notes. Patient's presentation is most consistent with acute presentation with potential threat to life or bodily function.  Patient presents with rectal bleeding.  Overall is quite well-appearing and in no acute distress.  Hemoglobin is normal.  Differential includes hemorrhoidal bleeding, less likely PUD, diverticulosis  Patient's glucose is elevated, discussed this with him here notes that he has not taken his medication today.  Exam is consistent with large hemorrhoid with evidence of recent bleeding.  Discussed with patient how to hold pressure on hemorrhoid, outpatient follow-up with surgery if needed for definitive care.        FINAL CLINICAL IMPRESSION(S) / ED DIAGNOSES   Final diagnoses:  Hemorrhoids, unspecified hemorrhoid type  Hyperglycemia  Rx / DC Orders   ED Discharge Orders     None        Note:  This document was prepared using Dragon voice recognition software and may include unintentional dictation errors.   Lavonia Drafts, MD 09/07/22 [phone]

## 2022-09-08 ENCOUNTER — Encounter: Payer: Self-pay | Admitting: Surgery

## 2022-09-08 ENCOUNTER — Other Ambulatory Visit (INDEPENDENT_AMBULATORY_CARE_PROVIDER_SITE_OTHER): Payer: Medicare Other

## 2022-09-08 DIAGNOSIS — E1149 Type 2 diabetes mellitus with other diabetic neurological complication: Secondary | ICD-10-CM | POA: Diagnosis not present

## 2022-09-08 DIAGNOSIS — D649 Anemia, unspecified: Secondary | ICD-10-CM | POA: Diagnosis not present

## 2022-09-08 DIAGNOSIS — E7800 Pure hypercholesterolemia, unspecified: Secondary | ICD-10-CM | POA: Diagnosis not present

## 2022-09-08 DIAGNOSIS — I10 Essential (primary) hypertension: Secondary | ICD-10-CM | POA: Diagnosis not present

## 2022-09-08 DIAGNOSIS — K7460 Unspecified cirrhosis of liver: Secondary | ICD-10-CM | POA: Diagnosis not present

## 2022-09-08 LAB — CBC WITH DIFFERENTIAL/PLATELET
Basophils Absolute: 0 10*3/uL (ref 0.0–0.1)
Basophils Relative: 0.5 % (ref 0.0–3.0)
Eosinophils Absolute: 0.1 10*3/uL (ref 0.0–0.7)
Eosinophils Relative: 1.9 % (ref 0.0–5.0)
HCT: 39.1 % (ref 39.0–52.0)
Hemoglobin: 12.6 g/dL — ABNORMAL LOW (ref 13.0–17.0)
Lymphocytes Relative: 40.7 % (ref 12.0–46.0)
Lymphs Abs: 3.1 10*3/uL (ref 0.7–4.0)
MCHC: 32.2 g/dL (ref 30.0–36.0)
MCV: 76.5 fl — ABNORMAL LOW (ref 78.0–100.0)
Monocytes Absolute: 0.8 10*3/uL (ref 0.1–1.0)
Monocytes Relative: 10 % (ref 3.0–12.0)
Neutro Abs: 3.6 10*3/uL (ref 1.4–7.7)
Neutrophils Relative %: 46.9 % (ref 43.0–77.0)
Platelets: 120 10*3/uL — ABNORMAL LOW (ref 150.0–400.0)
RBC: 5.11 Mil/uL (ref 4.22–5.81)
RDW: 15.4 % (ref 11.5–15.5)
WBC: 7.6 10*3/uL (ref 4.0–10.5)

## 2022-09-08 LAB — HEMOGLOBIN A1C: Hgb A1c MFr Bld: 14.9 % — ABNORMAL HIGH (ref 4.6–6.5)

## 2022-09-08 LAB — HEPATIC FUNCTION PANEL
ALT: 18 U/L (ref 0–53)
AST: 19 U/L (ref 0–37)
Albumin: 4.2 g/dL (ref 3.5–5.2)
Alkaline Phosphatase: 84 U/L (ref 39–117)
Bilirubin, Direct: 0.2 mg/dL (ref 0.0–0.3)
Total Bilirubin: 0.8 mg/dL (ref 0.2–1.2)
Total Protein: 6.9 g/dL (ref 6.0–8.3)

## 2022-09-08 LAB — BASIC METABOLIC PANEL
BUN: 11 mg/dL (ref 6–23)
CO2: 26 mEq/L (ref 19–32)
Calcium: 9.1 mg/dL (ref 8.4–10.5)
Chloride: 96 mEq/L (ref 96–112)
Creatinine, Ser: 0.92 mg/dL (ref 0.40–1.50)
GFR: 95.08 mL/min (ref >60.00)
Glucose, Bld: 370 mg/dL — ABNORMAL HIGH (ref 70–99)
Potassium: 3.6 mEq/L (ref 3.5–5.1)
Sodium: 132 mEq/L — ABNORMAL LOW (ref 135–145)

## 2022-09-08 LAB — LIPID PANEL
Cholesterol: 135 mg/dL (ref 0–200)
HDL: 34.1 mg/dL — ABNORMAL LOW (ref >39.00)
NonHDL: 101.16
Total CHOL/HDL Ratio: 4
Triglycerides: 333 mg/dL — ABNORMAL HIGH (ref 0.0–149.0)
VLDL: 66.6 mg/dL — ABNORMAL HIGH (ref 0.0–40.0)

## 2022-09-08 LAB — TSH: TSH: 3.18 u[IU]/mL (ref 0.35–5.50)

## 2022-09-08 LAB — IBC + FERRITIN
Ferritin: 9 ng/mL — ABNORMAL LOW (ref 22.0–322.0)
Iron: 41 ug/dL — ABNORMAL LOW (ref 42–165)
Saturation Ratios: 9.4 % — ABNORMAL LOW (ref 20.0–50.0)
TIBC: 435.4 ug/dL (ref 250.0–450.0)
Transferrin: 311 mg/dL (ref 212.0–360.0)

## 2022-09-08 LAB — PROTIME-INR
INR: 1 ratio (ref 0.8–1.0)
Prothrombin Time: 11.4 s (ref 9.6–13.1)

## 2022-09-08 LAB — LDL CHOLESTEROL, DIRECT: Direct LDL: 53 mg/dL

## 2022-09-08 NOTE — Progress Notes (Signed)
[name], male   DOB: Aug 29, 1971, 51 y.o.   MRN: [id number]  HPI Cameron Sanchez is a 51 y.o. male seen in consultation at the request of Dr. Sela Hilding.  He complains of several weeks of anorectal pain that is sharp intermittent and severe. Worsening when having Bms, He feels like passing sharp blades.  He also has intermittent hematochezia. He does have chronic back pain and takes 15 mg of oxycodone 4 times a day. He did have a CBC that was normal and CMP with uncontrolled sugars up to 481. He did have an ultrasound as well as an MRI that have personally reviewed showing evidence of a small segment 6 hepatic lesion measuring less than 1 cm.  There was also evidence of cirrhosis, splenomegaly. 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  Please note that I personally reviewed the images. Recs of 6 month repeat colonoscopy given    HPI  Past Medical History:  Diagnosis Date   Allergy    Chronic back pain    Diabetes mellitus without complication ([place])    Hypercholesterolemia    Hypertension    Migraines     Past Surgical History:  Procedure Laterality Date   BACK SURGERY  08/12/14   multiple   CHOLECYSTECTOMY     COLONOSCOPY WITH PROPOFOL N/A 03/11/2022   Procedure: COLONOSCOPY WITH PROPOFOL;  Surgeon: [name], MD;  Location: [facility] ENDOSCOPY;  Service: Gastroenterology;  Laterality: N/A;   ESOPHAGOGASTRODUODENOSCOPY N/A 03/11/2022   Procedure: ESOPHAGOGASTRODUODENOSCOPY (EGD);  Surgeon: [name], MD;  Location: [facility] ENDOSCOPY;  Service: Gastroenterology;  Laterality: N/A;   KNEE ARTHROSCOPY     Dr HBK    Family History  Problem Relation Age of Onset   Cancer Father        germ cell (retroperitoneal)   Coronary artery disease Other        s/p stent (age 9)   Hypertension Paternal Grandfather    Kidney cancer Paternal Grandfather    Diabetes  Paternal Grandfather    Diabetes Maternal Grandfather    Heart disease Cousin        MI - age 63    Social History Social History   Tobacco Use   Smoking status: Never   Smokeless tobacco: Never  Vaping Use   Vaping Use: Never used  Substance Use Topics   Alcohol use: No    Alcohol/week: 0.0 standard drinks of alcohol   Drug use: No    No Known Allergies  Current Outpatient Medications  Medication Sig Dispense Refill   allopurinol (ZYLOPRIM) 300 MG tablet TAKE 1 TABLET BY MOUTH EVERY DAY 90 tablet 0   atorvastatin (LIPITOR) 40 MG tablet TAKE 1 TABLET BY MOUTH EVERY DAY (Patient taking differently: Take 40 mg by mouth daily.) 30 tablet 2   Continuous Blood Gluc Sensor (FREESTYLE LIBRE 3 SENSOR) MISC Place 1 sensor on the skin every 14 days. Use to check glucose continuously 2 each 3   metaxalone (SKELAXIN) 800 MG tablet Take 800 mg by mouth 3 (three) times daily.     metFORMIN (GLUCOPHAGE) 500 MG tablet Take 1 tablet (500 mg total) by mouth 2 (two) times daily with a meal. 60 tablet 0   metoprolol succinate (TOPROL-XL) 50 MG 24 hr tablet TAKE 2 TABLETS BY MOUTH DAILY WITH OR IMMEDIATELY FOLLOWING A MEAL. 180 tablet 0   nystatin (MYCOSTATIN)  100000 UNIT/ML suspension 5cc's swish and spit tid 60 mL 0   oxyCODONE (ROXICODONE) 15 MG immediate release tablet Take 15 mg by mouth every 4 (four) hours.     pantoprazole (PROTONIX) 40 MG tablet Take 1 tablet (40 mg total) by mouth 2 (two) times daily before a meal. 60 tablet 2   triamterene-hydrochlorothiazide (MAXZIDE-25) 37.5-25 MG tablet TAKE 1 TABLET BY MOUTH EVERY DAY 90 tablet 0   No current facility-administered medications for this visit.     Review of Systems Full ROS  was asked and was negative except for the information on the HPI  Physical Exam Blood pressure 135/78, pulse 88, temperature 98.5 F (36.9 C), temperature source Oral, height '5\' 7"'$  (1.702 m), weight 208 lb 12.8 oz (94.7 kg), SpO2 98 %. CONSTITUTIONAL:  NAD. EYES: Pupils are equal, round, Sclera are non-icteric. EARS, NOSE, MOUTH AND THROAT:  The oral mucosa is pink and moist. Hearing is intact to voice. LYMPH NODES:  Lymph nodes in the neck are normal. RESPIRATORY:  Lungs are clear. There is normal respiratory effort, with equal breath sounds bilaterally, and without pathologic use of accessory muscles. CARDIOVASCULAR: Heart is regular without murmurs, gallops, or rubs. GI: The abdomen is  soft, nontender, and nondistended. There are no palpable masses. There is no hepatosplenomegaly. There are normal bowel sounds in all quadrants. Rectal: evidence of large posterior midline fissures, no masses, some skin tags of little clinical  significance.   MUSCULOSKELETAL: Normal muscle strength and tone. No cyanosis or edema.   SKIN: Turgor is good and there are no pathologic skin lesions or ulcers. NEUROLOGIC: Motor and sensation is grossly normal. Cranial nerves are grossly intact. PSYCH:  Oriented to person, place and time. Affect is normal.  Data Reviewed  I have personally reviewed the patient's imaging, laboratory findings and medical records.    Assessment/Plan 51 year old male with significant anorectal pain consistent with anal fissure.  Discussed with patient in detail about his disease process.  Discussed about medical management to include fiber, sitz bath's and nifedipine cream.  There is no need for immediate surgical intervention.  I will see him back in about 3 to 4 weeks.  Discussed with him in detail about the role of chemical sphincterotomy with Botox.  I do think that probably he is going to end up needing chemical sphincterotomy.  I will give him the benefit of the doubt D/w him about improving and optimizing diabetes. .  Please note that I spent 60 minutes in this encounter including personally reviewing imaging studies, coordinating his care, and performing appropriate documentation  Caroleen Hamman, MD FACS General  Surgeon 09/08/2022, 2:50 PM

## 2022-09-15 ENCOUNTER — Ambulatory Visit (INDEPENDENT_AMBULATORY_CARE_PROVIDER_SITE_OTHER): Payer: Medicare Other | Admitting: Internal Medicine

## 2022-09-15 ENCOUNTER — Encounter: Payer: Self-pay | Admitting: Internal Medicine

## 2022-09-15 ENCOUNTER — Telehealth: Payer: Self-pay

## 2022-09-15 VITALS — BP 126/72 | HR 70 | Temp 97.8°F | Resp 17 | Ht 67.0 in | Wt 208.0 lb

## 2022-09-15 DIAGNOSIS — E7800 Pure hypercholesterolemia, unspecified: Secondary | ICD-10-CM | POA: Diagnosis not present

## 2022-09-15 DIAGNOSIS — K7460 Unspecified cirrhosis of liver: Secondary | ICD-10-CM

## 2022-09-15 DIAGNOSIS — D649 Anemia, unspecified: Secondary | ICD-10-CM

## 2022-09-15 DIAGNOSIS — I700 Atherosclerosis of aorta: Secondary | ICD-10-CM | POA: Diagnosis not present

## 2022-09-15 DIAGNOSIS — K769 Liver disease, unspecified: Secondary | ICD-10-CM

## 2022-09-15 DIAGNOSIS — D696 Thrombocytopenia, unspecified: Secondary | ICD-10-CM | POA: Diagnosis not present

## 2022-09-15 DIAGNOSIS — E1149 Type 2 diabetes mellitus with other diabetic neurological complication: Secondary | ICD-10-CM

## 2022-09-15 MED ORDER — TRESIBA FLEXTOUCH 100 UNIT/ML ~~LOC~~ SOPN: 20.0000 [IU] | PEN_INJECTOR | Freq: Every day | SUBCUTANEOUS | 2 refills | Status: DC

## 2022-09-15 NOTE — Progress Notes (Signed)
[name], male   DOB: [birth year], 51 y.o.   MRN: [id number]   Subjective:    [name], male    DOB: 10-09-1971, 51 y.o.   MRN: [id number]   Patient here for  Chief Complaint  Patient presents with   Diabetes   Hypertension   Hyperlipidemia   .   HPI Here to follow up regarding blood sugar, blood pressure, cholesterol and previous liver lesion.  Recently evaluated by Dr Dahlia Byes for anorectal pain and bleeding - anal fissure.  Recommended fiber, sitz baths and nifedipine cream.  Discussed possible chemical sphincterotomy with botox.  (Stool softener, dulcolax and benefiber).  Discussed recent labs.  Discussed sugars poorly controlled.  A1c 14.9.  not taking metformin.  Not watching diet.  Discussed importance of low carb diet and exercise.  Discussed importance of checking his blood sugars and the need to get sugars under control.     Past Medical History:  Diagnosis Date   Allergy    Chronic back pain    Diabetes mellitus without complication ([place])    Hypercholesterolemia    Hypertension    Migraines    Past Surgical History:  Procedure Laterality Date   BACK SURGERY  08/12/14   multiple   CHOLECYSTECTOMY     COLONOSCOPY WITH PROPOFOL N/A 03/11/2022   Procedure: COLONOSCOPY WITH PROPOFOL;  Surgeon: [name], MD;  Location: [facility] ENDOSCOPY;  Service: Gastroenterology;  Laterality: N/A;   ESOPHAGOGASTRODUODENOSCOPY N/A 03/11/2022   Procedure: ESOPHAGOGASTRODUODENOSCOPY (EGD);  Surgeon: [name], MD;  Location: [facility] ENDOSCOPY;  Service: Gastroenterology;  Laterality: N/A;   KNEE ARTHROSCOPY     Dr HBK   Family History  Problem Relation Age of Onset   Cancer Father        germ cell (retroperitoneal)   Coronary artery disease Other        s/p stent (age 46)   Hypertension Paternal Grandfather    Kidney cancer Paternal Grandfather    Diabetes Paternal Grandfather    Diabetes Maternal Grandfather    Heart disease Cousin        MI - age 26    Social History   Socioeconomic History   Marital status: Married    Spouse name: Not on file   Number of children: 2   Years of education: 12   Highest education level: Not on file  Occupational History   Occupation: disabled    Employer: [employer]  Tobacco Use   Smoking status: Never   Smokeless tobacco: Never  Vaping Use   Vaping Use: Never used  Substance and Sexual Activity   Alcohol use: No    Alcohol/week: 0.0 standard drinks of alcohol   Drug use: No   Sexual activity: Not on file  Other Topics Concern   Not on file  Social History Narrative   Lives with girlfriend in a one story home.  Has 2 sons.  Trying to get disability due to several back issues.  Education: high school.    Social Determinants of Health   Financial Resource Strain: Low Risk  (02/24/2022)   Overall Financial Resource Strain (CARDIA)    Difficulty of Paying Living Expenses: Not hard at all  Food Insecurity: No Food Insecurity (02/24/2022)   Hunger Vital Sign    Worried About Running Out of Food in the Last Year: Never true    Ran Out of Food in the Last Year: Never true  Transportation Needs: No Transportation Needs (02/24/2022)  PRAPARE - Hydrologist (Medical): No    Lack of Transportation (Non-Medical): No  Physical Activity: Not on file  Stress: No Stress Concern Present (02/24/2022)   [place]    Feeling of Stress : Not at all  Social Connections: Unknown (02/24/2022)   Social Connection and Isolation Panel [NHANES]    Frequency of Communication with Friends and Family: Not on file    Frequency of Social Gatherings with Friends and Family: Not on file    Attends Religious Services: Not on file    Active Member of Clubs or Organizations: Not on file    Attends Archivist Meetings: Not on file    Marital Status: Married     Review of Systems  Constitutional:   Positive for fatigue. Negative for appetite change.  HENT:  Negative for congestion and sinus pressure.   Respiratory:  Negative for cough and chest tightness.        Breathing stable.   Cardiovascular:  Negative for chest pain, palpitations and leg swelling.  Gastrointestinal:  Positive for constipation. Negative for diarrhea, nausea and vomiting.  Genitourinary:  Negative for difficulty urinating and dysuria.  Musculoskeletal:  Positive for back pain. Negative for joint swelling.  Skin:  Negative for color change and rash.  Neurological:  Negative for dizziness and headaches.  Psychiatric/Behavioral:  Negative for agitation and dysphoric mood.        Objective:     BP 126/72 (BP Location: Left Arm, Patient Position: Sitting, Cuff Size: Large)   Pulse 70   Temp 97.8 F (36.6 C) (Temporal)   Resp 17   Ht 5' 7" (1.702 m)   Wt 208 lb (94.3 kg)   SpO2 97%   BMI 32.58 kg/m  Wt Readings from Last 3 Encounters:  09/15/22 208 lb (94.3 kg)  09/06/22 208 lb 12.8 oz (94.7 kg)  07/14/22 218 lb 6.4 oz (99.1 kg)    Physical Exam Vitals reviewed.  Constitutional:      General: He is not in acute distress.    Appearance: Normal appearance. He is well-developed.  HENT:     Head: Normocephalic and atraumatic.     Right Ear: External ear normal.     Left Ear: External ear normal.  Eyes:     General: No scleral icterus.       Right eye: No discharge.        Left eye: No discharge.     Conjunctiva/sclera: Conjunctivae normal.  Cardiovascular:     Rate and Rhythm: Normal rate and regular rhythm.  Pulmonary:     Effort: Pulmonary effort is normal. No respiratory distress.     Breath sounds: Normal breath sounds.  Abdominal:     General: Bowel sounds are normal.     Palpations: Abdomen is soft.     Tenderness: There is no abdominal tenderness.  Musculoskeletal:        General: No swelling or tenderness.     Cervical back: Neck supple. No tenderness.  Lymphadenopathy:      Cervical: No cervical adenopathy.  Skin:    Findings: No erythema or rash.  Neurological:     Mental Status: He is alert.  Psychiatric:        Mood and Affect: Mood normal.        Behavior: Behavior normal.      Outpatient Encounter Medications as of 09/15/2022  Medication Sig   allopurinol (ZYLOPRIM) 300  MG tablet TAKE 1 TABLET BY MOUTH EVERY DAY   atorvastatin (LIPITOR) 40 MG tablet TAKE 1 TABLET BY MOUTH EVERY DAY (Patient taking differently: Take 40 mg by mouth daily.)   Continuous Blood Gluc Sensor (FREESTYLE LIBRE 3 SENSOR) MISC Place 1 sensor on the skin every 14 days. Use to check glucose continuously   metaxalone (SKELAXIN) 800 MG tablet Take 800 mg by mouth 3 (three) times daily.   metFORMIN (GLUCOPHAGE) 500 MG tablet Take 1 tablet (500 mg total) by mouth 2 (two) times daily with a meal. (Patient taking differently: Take 500 mg by mouth daily.)   metoprolol succinate (TOPROL-XL) 50 MG 24 hr tablet TAKE 2 TABLETS BY MOUTH DAILY WITH OR IMMEDIATELY FOLLOWING A MEAL.   nystatin (MYCOSTATIN) 100000 UNIT/ML suspension 5cc's swish and spit tid   oxyCODONE (ROXICODONE) 15 MG immediate release tablet Take 15 mg by mouth every 4 (four) hours.   pantoprazole (PROTONIX) 40 MG tablet Take 1 tablet (40 mg total) by mouth 2 (two) times daily before a meal.   triamterene-hydrochlorothiazide (MAXZIDE-25) 37.5-25 MG tablet TAKE 1 TABLET BY MOUTH EVERY DAY   [DISCONTINUED] insulin degludec (TRESIBA FLEXTOUCH) 100 UNIT/ML FlexTouch Pen Inject 20 Units into the skin daily.   No facility-administered encounter medications on file as of 09/15/2022.     Lab Results  Component Value Date   WBC 7.6 09/08/2022   HGB 12.6 (L) 09/08/2022   HCT 39.1 09/08/2022   PLT 120.0 (L) 09/08/2022   GLUCOSE 370 (H) 09/08/2022   CHOL 135 09/08/2022   TRIG 333.0 (H) 09/08/2022   HDL 34.10 (L) 09/08/2022   LDLDIRECT 53.0 09/08/2022   LDLCALC 43 06/10/2022   ALT 18 09/08/2022   AST 19 09/08/2022   NA 132 (L)  09/08/2022   K 3.6 09/08/2022   CL 96 09/08/2022   CREATININE 0.92 09/08/2022   BUN 11 09/08/2022   CO2 26 09/08/2022   TSH 3.18 09/08/2022   PSA 0.43 01/04/2018   INR 1.0 09/08/2022   HGBA1C 14.9 (H) 09/08/2022   MICROALBUR 0.9 06/10/2022       Assessment & Plan:   Problem List Items Addressed This Visit     Anemia    Recent hgb decreased 12.6.  had colonoscopy 02/2022.  Multiple polyps.  Recommended f/u in 6-8 months.  (Dr Vicente Males).        Aortic atherosclerosis (HCC)    Lipitor.        Cirrhosis of liver ([place])    MRI as outlined.  Recent liver panel AST 38.  Remainder of liver panel wnl.  Discussed fatty liver.  Discussed the need to get sugars under better control.  Does not drink alcohol.  Refer to GI for further w/up and evaluation - cirrhosis and liver lesion. Hold lipitor until can sort through liver issues.        Hypercholesterolemia    On lipitor.  Low cholesterol diet and exercise.  Follow lipid panel and liver function tests.        Liver lesion    Found on recent MRI.  Have discussed further evaluation.  Have discussed recommendation for GI f/u for further w/up and evaluation.        Thrombocytopenia (HCC)    Low platelet count - feel related to his underlying liver issues.  Follow cbc.  GI referral as outlined.       Type 2 diabetes mellitus with neurological complications (HCC) - Primary    Sugars elevated as outlined.  Discussed diet  and exercise.  Not taking metformin as directed. Significant increase in sugars.  Discussed need for compliance with medication. Does not check blood sugars.  Discussed CGM.  Cost is an issue.  Referral for assistance.  Start tresiba.  Need to titrate up - pending response.  Follow met b and a1c.  Refer for medication management -diet instructions.  Send in sugar readings over the next couple of weeks.  Will need to adjust medication.       Relevant Orders   AMB Referral to Pharmacy Medication Management     Einar Pheasant,  MD

## 2022-09-15 NOTE — Telephone Encounter (Signed)
Patient states he saw Dr. Einar Pheasant today and she wanted him to start taking insulin.  Patient states [facility] [place] did not have the insulin he needs today.  Patient states he would be willing to go to another pharmacy, he is thinking [facility], [facility], or [facility].  Patient states he does not have insurance and uses [facility].  Patient states [facility] may be a possibility.  Patient states Dr. Nicki Reaper would like for him to start taking this as soon as possible.  Patient states he would like for [place] to call let him know where we are sending the prescription.

## 2022-09-16 ENCOUNTER — Other Ambulatory Visit: Payer: Self-pay

## 2022-09-16 MED ORDER — TRESIBA FLEXTOUCH 100 UNIT/ML ~~LOC~~ SOPN: 20.0000 [IU] | PEN_INJECTOR | Freq: Every day | SUBCUTANEOUS | 2 refills | Status: DC

## 2022-09-16 NOTE — Telephone Encounter (Signed)
Pt advised Cameron Sanchez sent to [facility]

## 2022-09-23 DIAGNOSIS — M5137 Other intervertebral disc degeneration, lumbosacral region: Secondary | ICD-10-CM | POA: Diagnosis not present

## 2022-09-23 DIAGNOSIS — G894 Chronic pain syndrome: Secondary | ICD-10-CM | POA: Diagnosis not present

## 2022-09-23 DIAGNOSIS — M961 Postlaminectomy syndrome, not elsewhere classified: Secondary | ICD-10-CM | POA: Diagnosis not present

## 2022-09-23 DIAGNOSIS — Z79891 Long term (current) use of opiate analgesic: Secondary | ICD-10-CM | POA: Diagnosis not present

## 2022-09-23 DIAGNOSIS — M545 Low back pain, unspecified: Secondary | ICD-10-CM | POA: Diagnosis not present

## 2022-09-26 ENCOUNTER — Telehealth: Payer: Self-pay | Admitting: Internal Medicine

## 2022-09-26 ENCOUNTER — Encounter: Payer: Self-pay | Admitting: Internal Medicine

## 2022-09-26 DIAGNOSIS — D696 Thrombocytopenia, unspecified: Secondary | ICD-10-CM | POA: Insufficient documentation

## 2022-09-26 NOTE — Assessment & Plan Note (Addendum)
Recent hgb decreased 12.6.  had colonoscopy 02/2022.  Multiple polyps.  Recommended f/u in 6-8 months.  (Dr Vicente Males).

## 2022-09-26 NOTE — Assessment & Plan Note (Signed)
Lipitor 

## 2022-09-26 NOTE — Telephone Encounter (Signed)
Recent visit, started on tresiba.  Need to know if he has been checking sugars.  If so, send in readings.  If not, needs to start checking.  Low carb diet.  Also, have him hold lipitor until we can sort through his liver issues.  Also, let him know I have referred him to [name] to help with diabetes management.

## 2022-09-26 NOTE — Assessment & Plan Note (Signed)
Found on recent MRI.  Have discussed further evaluation.  Have discussed recommendation for GI f/u for further w/up and evaluation.

## 2022-09-26 NOTE — Assessment & Plan Note (Signed)
Low platelet count - feel related to his underlying liver issues.  Follow cbc.  GI referral as outlined.

## 2022-09-26 NOTE — Assessment & Plan Note (Signed)
Sugars elevated as outlined.  Discussed diet and exercise.  Not taking metformin as directed. Significant increase in sugars.  Discussed need for compliance with medication. Does not check blood sugars.  Discussed CGM.  Cost is an issue.  Referral for assistance.  Start tresiba.  Need to titrate up - pending response.  Follow met b and a1c.  Refer for medication management -diet instructions.  Send in sugar readings over the next couple of weeks.  Will need to adjust medication.

## 2022-09-26 NOTE — Assessment & Plan Note (Signed)
On lipitor.  Low cholesterol diet and exercise.  Follow lipid panel and liver function tests.   

## 2022-09-26 NOTE — Assessment & Plan Note (Addendum)
MRI as outlined.  Recent liver panel AST 38.  Remainder of liver panel wnl.  Discussed fatty liver.  Discussed the need to get sugars under better control.  Does not drink alcohol.  Refer to GI for further w/up and evaluation - cirrhosis and liver lesion. Hold lipitor until can sort through liver issues.

## 2022-09-27 ENCOUNTER — Telehealth: Payer: Self-pay

## 2022-09-27 NOTE — Progress Notes (Signed)
   Care Guide Note  09/27/2022 Name: Cameron Sanchez MRN: [id number] DOB: February 27, 1971  Referred by: Einar Pheasant, MD Reason for referral : Care Coordination (Outreach to schedule with Pharm D )   Cameron Sanchez is a 51 y.o. year old male who is a primary care patient of Einar Pheasant, MD. Cameron Sanchez was referred to the pharmacist for assistance related to DM.    Successful contact was made with the patient to discuss pharmacy services including being ready for the pharmacist to call at least 5 minutes before the scheduled appointment time, to have medication bottles and any blood sugar or blood pressure readings ready for review. The patient agreed to meet with the pharmacist via with the pharmacist via telephone visit on 09/28/2022.    Noreene Larsson, [place], [address] [address] Direct Dial: [phone] [email or web address]

## 2022-09-28 ENCOUNTER — Other Ambulatory Visit: Payer: Self-pay

## 2022-09-28 ENCOUNTER — Other Ambulatory Visit: Payer: Medicare Other | Admitting: Pharmacist

## 2022-09-28 DIAGNOSIS — E1149 Type 2 diabetes mellitus with other diabetic neurological complication: Secondary | ICD-10-CM

## 2022-09-28 MED ORDER — TRESIBA FLEXTOUCH 200 UNIT/ML ~~LOC~~ SOPN
24.0000 [IU] | PEN_INJECTOR | Freq: Every day | SUBCUTANEOUS | 1 refills | Status: DC
  Filled 2022-09-28: qty 9, 35d supply, fill #0

## 2022-09-28 MED ORDER — TRESIBA FLEXTOUCH 100 UNIT/ML ~~LOC~~ SOPN
24.0000 [IU] | PEN_INJECTOR | Freq: Every day | SUBCUTANEOUS | 2 refills | Status: DC
  Filled 2022-09-28: qty 15, 62d supply, fill #0

## 2022-09-28 MED ORDER — METFORMIN HCL 500 MG PO TABS: 1000.0000 mg | ORAL_TABLET | Freq: Two times a day (BID) | ORAL | 3 refills | Status: DC

## 2022-09-28 NOTE — Telephone Encounter (Signed)
See note from [facility]

## 2022-09-28 NOTE — Addendum Note (Signed)
Addended by: Kaylyn Layer T on: 09/28/2022 02:13 PM   Modules accepted: Orders

## 2022-09-28 NOTE — Progress Notes (Cosign Needed Addendum)
09/28/2022 Name: Cameron Sanchez MRN: [id number] DOB: April 25, 1971  Chief Complaint  Patient presents with   Medication Management   Hyperlipidemia   Diabetes   Hypertension    Cameron Sanchez is a 51 y.o. year old male who presented for a telephone visit.   They were referred to the pharmacist by their PCP for assistance in managing diabetes and medication access.   Subjective:  Care Team: Primary Care Provider: Einar Pheasant, MD ; Next Scheduled Visit: 11/02/22  Medication Access/Adherence  Current Pharmacy:  [id number] [place] [address] [place]Phone: [phone]: [phone]  Patient reports affordability concerns with their medications: Yes  Patient reports access/transportation concerns to their pharmacy: No  Patient reports adherence concerns with their medications:  Yes    Reports TTyler Sanchez was $190. Cannot continue to afford this. Reports he has heard a lot of bad things about metformin and has been concerned about taking twice daily. Notes he does sometimes forget.    Diabetes:  Current medications: metformin 500 mg twice daily - taking ~ once dialy  Using Libre 3 CGM (paying out of pocket), but unable to talk through [place] [facility] [place]. Reports readings remain in the ~300-400 range.   Patient denies hypoglycemic s/sx including dizziness, shakiness, sweating.  Current meal patterns:  - Breakfast: bacon and eggs; - Lunch: has been skipping; yesterday had apple - Supper: grilled chicken, asparagus, avoiding pasta, rice, hashbrowns - Drinks: sweet tea (has cut back on how much sugar added); water with zero sugar flavoring packets  Wife works. Aware he should investigate Part D plan (Medicare A/B via disability)   Hypertension:  Current medications: triamterene/HCTZ 27.5/25 mg daily, metoprolol succinate 100 mg daily (taking 2 50 mg tablets daily)  Hyperlipidemia/ASCVD Risk  Reduction  Current lipid lowering medications: atorvastatin 40 mg daily - has been unable to afford; his father has been splitting his atorvastatin in half and giving him half.   Chronic Pain: Current medications: oxycodone 15 mg Q4H PRN - using regularly; metaxalone 800 mg three times daily as needed  Health Maintenance  Health Maintenance Due  Topic Date Due   Zoster Vaccines- Shingrix (1 of 2) Never done     Objective: Lab Results  Component Value Date   HGBA1C 14.9 (H) 09/08/2022    Lab Results  Component Value Date   CREATININE 0.92 09/08/2022   BUN 11 09/08/2022   NA 132 (L) 09/08/2022   K 3.6 09/08/2022   CL 96 09/08/2022   CO2 26 09/08/2022    Lab Results  Component Value Date   CHOL 135 09/08/2022   HDL 34.10 (L) 09/08/2022   LDLCALC 43 06/10/2022   LDLDIRECT 53.0 09/08/2022   TRIG 333.0 (H) 09/08/2022   CHOLHDL 4 09/08/2022    Medications Reviewed Today     Reviewed by HOsker Mason RPH-CPP (Pharmacist) on 09/28/22 at 1042  Med List Status: <None>   Medication Order Taking? Sig Documenting Provider Last Dose Status Informant  allopurinol (ZYLOPRIM) 300 MG tablet [id number] TAKE 1 TABLET BY MOUTH EVERY DAY SEinar Pheasant MD Taking Active   atorvastatin (LIPITOR) 40 MG tablet [id number] TAKE 1 TABLET BY MOUTH EVERY DAY  Patient not taking: Reported on 09/28/2022   SEinar Pheasant MD Not Taking Active Self  Continuous Blood Gluc Sensor (FREESTYLE LIBRE 3 SENSOR) [id number] Place 1 sensor on the skin every 14 days. Use to  check glucose continuously Einar Pheasant, MD Taking Active   insulin degludec (TRESIBA FLEXTOUCH) 100 UNIT/ML FlexTouch Pen [id number] Yes Inject 20 Units into the skin daily. Einar Pheasant, MD Taking Active   metaxalone [facility]) 800 MG tablet [id number] Yes Take 800 mg by mouth 3 (three) times daily. [provider] Taking Active   metFORMIN (GLUCOPHAGE) 500 MG tablet [id number] Yes Take 1 tablet (500 mg  total) by mouth 2 (two) times daily with a meal. Einar Pheasant, MD Taking Active   metoprolol succinate (TOPROL-XL) 50 MG 24 hr tablet [id number] Yes TAKE 2 TABLETS BY MOUTH DAILY WITH OR IMMEDIATELY FOLLOWING A MEAL. Einar Pheasant, MD Taking Active   oxyCODONE (ROXICODONE) 15 MG immediate release tablet [id number] Yes Take 15 mg by mouth every 4 (four) hours. [provider] Taking Active   pantoprazole (PROTONIX) 40 MG tablet [id number] Yes Take 1 tablet (40 mg total) by mouth 2 (two) times daily before a meal. Einar Pheasant, MD Taking Active   triamterene-hydrochlorothiazide (MAXZIDE-25) 37.5-25 MG tablet [id number] Yes TAKE 1 TABLET BY MOUTH EVERY DAY Einar Pheasant, MD Taking Active               Assessment/Plan:   Diabetes: - Currently uncontrolled - Reviewed long term cardiovascular and renal outcomes of uncontrolled blood sugar - Reviewed goal A1c, goal fasting, and goal 2 hour post prandial glucose - Reviewed dietary modifications including: reducing carbohydrate intake, portion sizes. Praised modifications already made to reduce sugar in tea, minimize carbohydrates with meals - Discussed metformin, typical side effects, mechanism of action. Discussed changing to XR formulation if that would help adherence. Patient elects to continue current formulation has he has a supply at home, but agreeable to titrate to max dose. Start by increasing to 500 mg twice daily; if no GI upset after a week, increase to 1000 mg twice daily. Patient verbalizes understanding. Noted cirrhosis, but LFTs normal at this time.  - Collaborated with [facility] regarding insulin access.  - Increase Tresiba to 24 units daily. Continue current supply for now. Will switch to [place] for next fill as that strength is on hand at pharmacy.   Hypertension - Currently controlled - Recommend to continue current regimen at this time. Continue to monitor for albuminuria/need to initiate  ACEi/ARB.   Hyperlipidemia/ASCVD Risk Reduction: - Currently uncontrolled.  - Reviewed long term complications of uncontrolled cholesterol - Per PCP recommendation, hold on statin at this time until GI work up for cirrhosis    Follow Up Plan: phone call in 2 weeks  [name] Hedwig Morton, PharmD, [place] [phone]

## 2022-09-28 NOTE — Patient Instructions (Signed)
Jaquil,   It was great talking to you today.   Increase metformin to 500 mg twice daily. If, after a week, you are not having any new diarrhea or stomach upset, increase to 1000 mg (2 tablets) twice daily.   Increase Tresiba to 24 units daily.   Our goal A1c is less than 7%. This corresponds with a Time in Range (70-180) of at least 70%. We will continue to work toward this.   Keep up the great work with minimizing carbohydrate portion sizes and reducing the amount of sugar in your tea.   It can be complex choosing a Medicare plan. A group that I always recommend for my patients is called the [place] (https://www.strong.com/ , or just google "[place] SHIIP" ) They have good information on their website regarding navigating Medicare. They also have in person volunteer counselors that can help evaluate options in a non-biased way. The [facility] team is at the [facility] ([place], [place], [place] [address], [phone]). You can call and set up an appointment to meet with someone to navigate Medicare insurance.   Call me with any questions,   [name] TJodi Mourning, PharmD, [facility] [phone]

## 2022-09-29 ENCOUNTER — Ambulatory Visit: Payer: Medicare Other | Admitting: Surgery

## 2022-10-04 ENCOUNTER — Ambulatory Visit (INDEPENDENT_AMBULATORY_CARE_PROVIDER_SITE_OTHER): Payer: Medicare Other | Admitting: Surgery

## 2022-10-04 ENCOUNTER — Telehealth: Payer: Self-pay | Admitting: Surgery

## 2022-10-04 ENCOUNTER — Encounter: Payer: Self-pay | Admitting: Surgery

## 2022-10-04 ENCOUNTER — Other Ambulatory Visit: Payer: Self-pay | Admitting: Pharmacy Technician

## 2022-10-04 VITALS — BP 134/85 | HR 69 | Temp 98.0°F | Ht 67.0 in | Wt 209.0 lb

## 2022-10-04 DIAGNOSIS — K602 Anal fissure, unspecified: Secondary | ICD-10-CM

## 2022-10-04 NOTE — Telephone Encounter (Signed)
Left message for patient to call, please inform him of the following regarding scheduled surgery.   Pre-Admission date/time, and Surgery date at [facility].  Surgery Date: 10/21/22 Preadmission Testing Date: 10/14/22 (phone 8a-1p)  Also patient will need to call at [phone], between 1-3:00pm the day before surgery, to find out what time to arrive for surgery.

## 2022-10-04 NOTE — Patient Instructions (Addendum)
Advised to pursue a goal of 25 to 30 g of fiber daily.  Made aware that the majority of this may be through natural sources, but advised to be aware of actual consumption and to ensure minimal consumption by daily supplementation.  Various forms of supplements discussed.  Recommended Psyllium husk, that mixes well with applesauce, or the powder which goes down well shaken with chocolate milk. Benefiber, Metamucil, Citrucel.   Strongly advised to consume more fluids to ensure adequate hydration, instructed to watch color of urine to determine adequacy of hydration.  Clarity is pursued in urine output, and bowel activity that correlates to significant meal intake.   We need to avoid deferring having bowel movements, advised to take the time at the first sign of sensation, typically following meals, and in the morning.   Subsequent utilization of MiraLAX may be needed ensure at least daily movement, ideally twice daily bowel movements.  If multiple doses of MiraLAX are necessary utilize them. Never skip a day...  To be regular, we must do the above EVERY day.   We spoke today about having a Botox injection in the or for treatment of your anal fissure.  Please see your Blue surgery sheet for more information. Our surgery scheduler will call you to look at surgery dates and to go over information.   Continue to do sitz baths especially after bowel movements.

## 2022-10-04 NOTE — Patient Outreach (Signed)
Spoke with patient about qualifying for [place]. Patient indicated that he receives Social [place].  Was not aware of spouse's income.  Explained that I need to know household income to determine if he qualifies to receive medication assistance from [employer].  Patient indicated that it was not a good time to talk.  Asked if he could call me back tomorrow, 10/05/22.  Provided patient with my phone number.    Jacquelynn Cree Patient Advocate Specialist [place] at [facility]

## 2022-10-05 ENCOUNTER — Encounter: Payer: Self-pay | Admitting: Surgery

## 2022-10-05 ENCOUNTER — Other Ambulatory Visit: Payer: Medicare Other | Admitting: Pharmacist

## 2022-10-05 NOTE — Telephone Encounter (Signed)
Patient calls back, he is now informed of all dates of surgery and verbalized understanding.

## 2022-10-05 NOTE — Progress Notes (Signed)
Outpatient Surgical Follow Up  10/05/2022  Cameron Sanchez is an 51 y.o. male.   Chief Complaint  Patient presents with   Follow-up    HPI:   Cameron Sanchez is a 51 y.o. male following for fissure. He feels like passing sharp blades when having bm.  He also has intermittent hematochezia. He has some improvement w nifedipine cream but still has significant sxs. He does have chronic back pain and takes 15 mg of oxycodone 4 times a day. He did have a CBC that was normal and CMP with uncontrolled sugars up to 481. He did have an ultrasound as well as an MRI that have personally reviewed showing evidence of a small segment 6 hepatic lesion measuring less than 1 cm.  There was also evidence of cirrhosis, splenomegaly. 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  Please note that I personally reviewed the images. Recs of 6 month repeat colonoscopy given   Past Medical History:  Diagnosis Date   Allergy    Chronic back pain    Diabetes mellitus without complication ([place])    Hypercholesterolemia    Hypertension    Migraines     Past Surgical History:  Procedure Laterality Date   BACK SURGERY  08/12/14   multiple   CHOLECYSTECTOMY     COLONOSCOPY WITH PROPOFOL N/A 03/11/2022   Procedure: COLONOSCOPY WITH PROPOFOL;  Surgeon: [name], MD;  Location: [facility];  Service: Gastroenterology;  Laterality: N/A;   ESOPHAGOGASTRODUODENOSCOPY N/A 03/11/2022   Procedure: ESOPHAGOGASTRODUODENOSCOPY (EGD);  Surgeon: [name], MD;  Location: [facility] END[facility]COPY;  Service: Gastroenterology;  Laterality: N/A;   KNEE ARTHROSCOPY     Dr HBK    Family History  Problem Relation Age of Onset   Cancer Father        germ cell (retroperitoneal)   Coronary artery disease Other        s/p stent (age 72)   Hypertension Paternal Grandfather    Kidney cancer Paternal Grandfather    Diabetes  Paternal Grandfather    Diabetes Maternal Grandfather    Heart disease Cousin        MI - age 42    Social History:  reports that he has never smoked. He has never used smokeless tobacco. He reports that he does not drink alcohol and does not use drugs.  Allergies: No Known Allergies  Medications reviewed.    ROS Full ROS performed and is otherwise negative other than what is stated in HPI   BP 134/85   Pulse 69   Temp 98 F (36.7 C)   Ht '5\' 7"'$  (1.702 m)   Wt 209 lb (94.8 kg)   SpO2 98%   BMI 32.73 kg/m   Physical Exam Chaperone present CONSTITUTIONAL: NAD. EYES: Pupils are equal, round, Sclera are non-icteric. EARS, NOSE, MOUTH AND THROAT:  The oral mucosa is pink and moist. Hearing is intact to voice. LYMPH NODES:  Lymph nodes in the neck are normal. RESPIRATORY:  Lungs are clear. There is normal respiratory effort, with equal breath sounds bilaterally, and without pathologic use of accessory muscles. CARDIOVASCULAR: Heart is regular without murmurs, gallops, or rubs. GI: The abdomen is  soft, nontender, and nondistended. There are no palpable masses. There is no hepatosplenomegaly. There are normal bowel sounds in all quadrants. Rectal: evidence of large posterior midline fissure, no masses, some skin tags of little  clinical  significance.   MUSCULOSKELETAL: Normal muscle strength and tone. No cyanosis or edema.   SKIN: Turgor is good and there are no pathologic skin lesions or ulcers. NEUROLOGIC: Motor and sensation is grossly normal. Cranial nerves are grossly intact. PSYCH:  Oriented to person, place and time. Affect is normal.  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  Discussed with patient in detail about his disease process.  Discussed about medical management to include fiber, sitz bath's and nifedipine cream.  He still has persistent sxs despite medical rx. Discussed with him in detail about the role of chemical sphincterotomy with Botox. Risks,  benefits and possible complications including but not limited to bleeding, infection, incontinence, recurrence and pain. Marland Kitchen  Please note that I spent 30 minutes in this encounter including personally reviewing imaging studies, coordinating his care, and performing appropriate documentation  Caroleen Hamman, MD [facility] [facility]

## 2022-10-05 NOTE — Telephone Encounter (Signed)
Left message again for patient to call.

## 2022-10-06 ENCOUNTER — Telehealth: Payer: Self-pay | Admitting: Internal Medicine

## 2022-10-06 ENCOUNTER — Other Ambulatory Visit: Payer: Self-pay

## 2022-10-06 MED ORDER — FREESTYLE LIBRE 3 SENSOR MISC: 3 refills | Status: DC

## 2022-10-06 NOTE — Telephone Encounter (Signed)
Pt need a refill on libre sensor sent to [facility]

## 2022-10-06 NOTE — Telephone Encounter (Signed)
SENT 

## 2022-10-12 ENCOUNTER — Other Ambulatory Visit: Payer: Self-pay

## 2022-10-12 ENCOUNTER — Other Ambulatory Visit: Payer: Medicare Other | Admitting: Pharmacist

## 2022-10-12 DIAGNOSIS — I700 Atherosclerosis of aorta: Secondary | ICD-10-CM

## 2022-10-12 DIAGNOSIS — E1149 Type 2 diabetes mellitus with other diabetic neurological complication: Secondary | ICD-10-CM

## 2022-10-12 DIAGNOSIS — I10 Essential (primary) hypertension: Secondary | ICD-10-CM

## 2022-10-12 MED ORDER — ATORVASTATIN CALCIUM 40 MG PO TABS
40.0000 mg | ORAL_TABLET | Freq: Every day | ORAL | 1 refills | Status: DC
  Filled 2022-10-12: qty 90, 90d supply, fill #0

## 2022-10-12 NOTE — Progress Notes (Signed)
10/12/2022 Name: Cameron Sanchez MRN: [id number] DOB: May 08, 1971  Chief Complaint  Patient presents with   Medication Management   Diabetes   Hyperlipidemia    Cameron Sanchez is a 51 y.o. year old male who presented for a telephone visit.   They were referred to the pharmacist by their PCP for assistance in managing diabetes and hyperlipidemia.   Subjective:  Care Team: Primary Care Provider: Einar Pheasant, MD ; Next Scheduled Visit: 10/21/22, but reports he has a procedure with Dr. Dahlia Byes that day   Medication Access/Adherence  Current Pharmacy:  [id number] [id number]: [phone]: [id number]: [phone]: [phone]  Patient reports affordability concerns with their medications: Yes  Patient reports access/transportation concerns to their pharmacy: No  Patient reports adherence concerns with their medications:  Yes  Reports he forgot how we had discussed increasing metformin and Tresiba. Reports he and his wife were just discussing pursuing a Part D plan for 2024   Diabetes:  Current medications: metformin 1000 mg twice daily - though he has still been taking 500 mg twice daily; Tresiba 24 units daily - but he reports that he forgot we increased the dose, so has been taking 20 units daily  Current glucose readings: reports libre readings remain in the 300-400s  Current meal patterns: continues to work with his wife to reduce carbohydrate intake. Concerned that they've made changes but readings remain high  Hypertension:  Current medications: metoprolol succinate 100 mg daily, triamterene/HCTZ 37.5/25 mg daily   Hyperlipidemia/ASCVD Risk Reduction  Current lipid lowering medications: atorvastatin 40 mg daily - but requests a refill today Health Maintenance  Health Maintenance Due   Topic Date Due   Zoster Vaccines- Shingrix (1 of 2) Never done   COVID-19 Vaccine (5 - 2023-24 season) 07/16/2022     Objective: Lab Results  Component Value Date   HGBA1C 14.9 (H) 09/08/2022    Lab Results  Component Value Date   CREATININE 0.92 09/08/2022   BUN 11 09/08/2022   NA 132 (L) 09/08/2022   K 3.6 09/08/2022   CL 96 09/08/2022   CO2 26 09/08/2022    Lab Results  Component Value Date   CHOL 135 09/08/2022   HDL 34.10 (L) 09/08/2022   LDLCALC 43 06/10/2022   LDLDIRECT 53.0 09/08/2022   TRIG 333.0 (H) 09/08/2022   CHOLHDL 4 09/08/2022    Medications Reviewed Today     Reviewed by HOsker Mason RPH-CPP (Pharmacist) on 10/12/22 at [phone] Med List Status: <None>   Medication Order Taking? Sig Documenting Provider Last Dose Status Informant  allopurinol (ZYLOPRIM) 300 MG tablet [id number] TAKE 1 TABLET BY MOUTH EVERY DAY SEinar Pheasant MD Taking Active   atorvastatin (LIPITOR) 40 MG tablet [id number] TAKE 1 TABLET BY MOUTH EVERY DAY SEinar Pheasant MD Taking Active Self  Continuous Blood Gluc Sensor (FREESTYLE LIBRE 3 SENSOR) [id number] Place 1 sensor on the skin every 14 days. Use to check glucose continuously SEinar Pheasant MD  Active   insulin degludec (TRESIBA FLEXTOUCH) 200 UNIT/ML FlexTouch Pen [id number] Inject 24 Units into the skin daily. SEinar Pheasant MD Taking Active   metaxalone ([facility] 800 MG tablet [id number] Take 800 mg by mouth 3 (three) times daily. [provider] Taking Active   metFORMIN (GLUCOPHAGE) 500 MG  tablet [id number] Yes Take 2 tablets (1,000 mg total) by mouth 2 (two) times daily with a meal. Einar Pheasant, MD Taking Active   metoprolol succinate (TOPROL-XL) 50 MG 24 hr tablet [id number] Yes TAKE 2 TABLETS BY MOUTH DAILY WITH OR IMMEDIATELY FOLLOWING A MEAL. Einar Pheasant, MD Taking Active   oxyCODONE (ROXICODONE) 15 MG immediate release tablet [id number] Yes Take 15 mg by mouth every 4 (four)  hours. [provider] Taking Active   pantoprazole (PROTONIX) 40 MG tablet [id number] Yes Take 1 tablet (40 mg total) by mouth 2 (two) times daily before a meal. Einar Pheasant, MD Taking Active   triamterene-hydrochlorothiazide (MAXZIDE-25) 37.5-25 MG tablet [id number] Yes TAKE 1 TABLET BY MOUTH EVERY DAY Einar Pheasant, MD Taking Active               Assessment/Plan:   Diabetes: - Currently uncontrolled - Reviewed goal A1c, goal fasting, and goal 2 hour post prandial glucose - Discussed pathophysiology of diabetes and mechanism of action of different antihypertensives. Reiterated dose increase of metformin and dose increase in [place]. Patient and wife verbalized understanding  - Recommend to check glucose continuously using CGM  Hypertension: - Currently controlled - Recommend to continue current regimen at this time   Hyperlipidemia/ASCVD Risk Reduction: - Currently controlled.  - Recommend to continue atorvastatin. Order sent to pharmacy   Follow Up Plan: TBD based on PCP follow up plan  [name] Hedwig Morton, PharmD, [place], [facility] [phone]

## 2022-10-12 NOTE — Progress Notes (Signed)
That is fine.  Also, let me know if I need to work him in.  Needs his sugar under better control prior to surgery.  Thank you

## 2022-10-12 NOTE — Patient Instructions (Signed)
Cameron Sanchez,   Increase metformin to two tablets twice daily and increase Tresiba to 24 units daily.   It can be complex choosing a Medicare plan. A group that I always recommend for my patients is called the [place] (https://www.strong.com/ , or just google "[place] SHIIP" ) They have good information on their website regarding navigating Medicare. They also have in person volunteer counselors that can help evaluate options in a non-biased way.   The [facility] team is at the [facility] ([place], [place],  [address], [phone]). You can call and set up an appointment to meet with someone to navigate Medicare insurance.   Take care!  [name] Hedwig Morton, PharmD, [facility] [phone]

## 2022-10-13 ENCOUNTER — Other Ambulatory Visit: Payer: Medicare Other | Admitting: Pharmacist

## 2022-10-13 NOTE — Progress Notes (Signed)
Spoke with patient. Rescheduled PCP appointment for next available in February. Scheduled follow up with me in ~ 2 weeks.   Suggested referral to Diabetic Education for dietary counseling. He notes he will think about it and let us know.   [name] Hedwig Morton, PharmD, [facility] [phone]

## 2022-10-14 ENCOUNTER — Encounter
Admission: RE | Admit: 2022-10-14 | Discharge: 2022-10-14 | Disposition: A | Discharge status: Home / Self Care | Payer: Medicare Other | Source: Ambulatory Visit | Attending: Surgery | Admitting: Surgery

## 2022-10-14 DIAGNOSIS — Z79899 Other long term (current) drug therapy: Secondary | ICD-10-CM

## 2022-10-14 DIAGNOSIS — I10 Essential (primary) hypertension: Secondary | ICD-10-CM

## 2022-10-14 DIAGNOSIS — Z01812 Encounter for preprocedural laboratory examination: Secondary | ICD-10-CM

## 2022-10-14 DIAGNOSIS — E1149 Type 2 diabetes mellitus with other diabetic neurological complication: Secondary | ICD-10-CM

## 2022-10-14 DIAGNOSIS — Z01818 Encounter for other preprocedural examination: Secondary | ICD-10-CM

## 2022-10-14 HISTORY — DX: Unspecified cirrhosis of liver: K74.60

## 2022-10-14 HISTORY — DX: Sleep apnea, unspecified: G47.30

## 2022-10-14 HISTORY — DX: Thrombocytopenia, unspecified: D69.6

## 2022-10-14 HISTORY — DX: Type 2 diabetes mellitus without complications: E11.9

## 2022-10-14 HISTORY — DX: Transient cerebral ischemic attack, unspecified: G45.9

## 2022-10-14 HISTORY — DX: Atherosclerosis of aorta: I70.0

## 2022-10-14 HISTORY — DX: Gout, unspecified: M10.9

## 2022-10-14 HISTORY — DX: Anemia, unspecified: D64.9

## 2022-10-14 HISTORY — DX: COVID-19: U07.1

## 2022-10-14 HISTORY — DX: Liver disease, unspecified: K76.9

## 2022-10-14 HISTORY — DX: Other complications of anesthesia, initial encounter: T88.59XA

## 2022-10-14 HISTORY — DX: Obesity, unspecified: E66.9

## 2022-10-14 HISTORY — DX: Gastro-esophageal reflux disease without esophagitis: K21.9

## 2022-10-14 NOTE — Patient Instructions (Signed)
Your procedure is scheduled on:10-21-22 Thursday Report to the Registration Desk on the 1st floor of the [facility].Then proceed to the 2nd floor Surgery Desk To find out your arrival time, please call [phone] between 1PM - 3PM on:10-20-22 Wednesday If your arrival time is 6:00 am, do not arrive prior to that time as the [place] [facility] entrance doors do not open until 6:00 am.  REMEMBER: Instructions that are not followed completely may result in serious medical risk, up to and including death; or upon the discretion of your surgeon and anesthesiologist your surgery may need to be rescheduled.  Do not eat food OR drink any liquids after midnight the night before surgery.  No gum chewing, lozengers or hard candies.  TAKE THESE MEDICATIONS THE MORNING OF SURGERY WITH A SIP OF WATER: -allopurinol (ZYLOPRIM)  -atorvastatin (LIPITOR)  -metaxalone (SKELAXIN)  -metoprolol succinate (TOPROL-XL)  -oxyCODONE (ROXICODONE)  -pantoprazole (PROTONIX)   Stop your metFORMIN (GLUCOPHAGE) 2 days prior to surgery-Last dose will be on 10-18-22 Monday  Do NOT take any Insulin the morning of surgery  One week prior to surgery: Stop Anti-inflammatories (NSAIDS) such as Advil, Aleve, Ibuprofen, Motrin, Naproxen, Naprosyn and Aspirin based products such as Excedrin, Goodys Powder, BC Powder.You may however, continue to take Tylenol if needed for pain up until the day of surgery.  Stop ANY OVER THE COUNTER supplements/vitamins NOW (10-14-22) until after surgery.  No Alcohol for 24 hours before or after surgery.  No Smoking including e-cigarettes for 24 hours prior to surgery.  No chewable tobacco products for at least 6 hours prior to surgery.  No nicotine patches on the day of surgery.  Do not use any "recreational" drugs for at least a week prior to your surgery.  Please be advised that the combination of cocaine and anesthesia may have negative outcomes, up to and including death. If you test  positive for cocaine, your surgery will be cancelled.  On the morning of surgery brush your teeth with toothpaste and water, you may rinse your mouth with mouthwash if you wish. Do not swallow any toothpaste or mouthwash.  Do not wear jewelry, make-up, hairpins, clips or nail polish.  Do not wear lotions, powders, or perfumes.   Do not shave body from the neck down 48 hours prior to surgery just in case you cut yourself which could leave a site for infection.  Also, freshly shaved skin may become irritated if using the CHG soap.  Contact lenses, hearing aids and dentures may not be worn into surgery.  Do not bring valuables to the hospital. [facility] is not responsible for any missing/lost belongings or valuables.   Fleets enema as directed. Do Fleet Enema the night prior to surgery and do another Fleet Enema the morning of surgery 2 hours prior to your arrival time to the hospital  Bring your C-PAP to the hospital with you  Notify your doctor if there is any change in your medical condition (cold, fever, infection).  Wear comfortable clothing (specific to your surgery type) to the hospital.  After surgery, you can help prevent lung complications by doing breathing exercises.  Take deep breaths and cough every 1-2 hours. Your doctor may order a device called an Incentive Spirometer to help you take deep breaths. When coughing or sneezing, hold a pillow firmly against your incision with both hands. This is called "splinting." Doing this helps protect your incision. It also decreases belly discomfort.  If you are being admitted to the hospital overnight,  leave your suitcase in the car. After surgery it may be brought to your room.  If you are being discharged the day of surgery, you will not be allowed to drive home. You will need a responsible adult (18 years or older) to drive you home and stay with you that night.   If you are taking public transportation, you will need to have a  responsible adult (18 years or older) with you. Please confirm with your physician that it is acceptable to use public transportation.   Please call the [facility] Dept. at [phone] if you have any questions about these instructions.  Surgery Visitation Policy:  Patients undergoing a surgery or procedure may have two family members or support persons with them as long as the person is not COVID-19 positive or experiencing its symptoms.   MASKING: Due to an increase in RSV rates and hospitalizations, in-patient care areas in which we serve newborns, infants and children, masks will be required for teammates and visitors.  Children ages 52 and under may not visit. This policy affects the following departments only:  [place] Postpartum area Mother Baby Unit Newborn nursery/Special care nursery  Other areas: Masks continue to be strongly recommended for [facility] teammates, visitors and patients in all other areas. Visitation is not restricted outside of the units listed above.

## 2022-10-15 ENCOUNTER — Encounter
Admission: RE | Admit: 2022-10-15 | Discharge: 2022-10-15 | Disposition: A | Discharge status: Home / Self Care | Payer: Medicare Other | Source: Ambulatory Visit | Attending: Surgery | Admitting: Surgery

## 2022-10-15 DIAGNOSIS — Z01812 Encounter for preprocedural laboratory examination: Secondary | ICD-10-CM | POA: Insufficient documentation

## 2022-10-15 DIAGNOSIS — Z79899 Other long term (current) drug therapy: Secondary | ICD-10-CM | POA: Insufficient documentation

## 2022-10-15 DIAGNOSIS — E1149 Type 2 diabetes mellitus with other diabetic neurological complication: Secondary | ICD-10-CM | POA: Insufficient documentation

## 2022-10-15 DIAGNOSIS — I10 Essential (primary) hypertension: Secondary | ICD-10-CM | POA: Diagnosis not present

## 2022-10-15 LAB — BASIC METABOLIC PANEL
Anion gap: 8 (ref 5–15)
BUN: 10 mg/dL (ref 6–20)
CO2: 24 mmol/L (ref 22–32)
Calcium: 9.3 mg/dL (ref 8.9–10.3)
Chloride: 105 mmol/L (ref 98–111)
Creatinine, Ser: 0.78 mg/dL (ref 0.61–1.24)
GFR, Estimated: >60 mL/min (ref >60)
Glucose, Bld: 176 mg/dL — ABNORMAL HIGH (ref 70–99)
Potassium: 4.1 mmol/L (ref 3.5–5.1)
Sodium: 137 mmol/L (ref 135–145)

## 2022-10-20 ENCOUNTER — Other Ambulatory Visit: Payer: Self-pay

## 2022-10-21 ENCOUNTER — Telehealth: Payer: Self-pay | Admitting: Surgery

## 2022-10-21 ENCOUNTER — Ambulatory Visit: Payer: Medicare Other | Admitting: Internal Medicine

## 2022-10-21 NOTE — Telephone Encounter (Signed)
Surgery rescheduled at patients request.  Updated information as follows regarding rescheduled surgery.   Patient has been advised of Pre-Admission date/time, and Surgery date at [facility]  Surgery Date: 11/04/22 Preadmission Testing Date: 10/15/22, already done  Patient has been made aware to call [phone], between 1-3:00pm the day before surgery, to find out what time to arrive for surgery.

## 2022-10-22 DIAGNOSIS — Z79891 Long term (current) use of opiate analgesic: Secondary | ICD-10-CM | POA: Diagnosis not present

## 2022-10-22 DIAGNOSIS — M545 Low back pain, unspecified: Secondary | ICD-10-CM | POA: Diagnosis not present

## 2022-10-22 DIAGNOSIS — G894 Chronic pain syndrome: Secondary | ICD-10-CM | POA: Diagnosis not present

## 2022-10-22 DIAGNOSIS — M5137 Other intervertebral disc degeneration, lumbosacral region: Secondary | ICD-10-CM | POA: Diagnosis not present

## 2022-10-22 DIAGNOSIS — M961 Postlaminectomy syndrome, not elsewhere classified: Secondary | ICD-10-CM | POA: Diagnosis not present

## 2022-10-28 ENCOUNTER — Other Ambulatory Visit: Payer: Medicare Other | Admitting: Pharmacist

## 2022-10-29 ENCOUNTER — Other Ambulatory Visit: Payer: Medicare Other | Admitting: Pharmacist

## 2022-10-29 NOTE — Patient Instructions (Signed)
Hi Jamesen,   Increase metformin to 2 tablet (1000 mg) twice daily. Continue Tresiba at 24 units daily.   I look forward to seeing you in January in the office!  [name] Hedwig Morton, PharmD, [facility] [phone]

## 2022-10-29 NOTE — Progress Notes (Signed)
10/29/2022 Name: Cameron Sanchez MRN: [id number] DOB: 1971/09/26  Chief Complaint  Patient presents with   Medication Management   Diabetes    Cameron Sanchez is a 51 y.o. year old male who presented for a telephone visit.   They were referred to the pharmacist by their PCP for assistance in managing diabetes.    Subjective:  Care Team: Primary Care Provider: Einar Pheasant, MD ; Next Scheduled Visit: 12/17/22  Medication Access/Adherence  Current Pharmacy:  [id number] [place]Phone: [phone]: [address]Phone: [phone]: [phone]  Patient reports affordability concerns with their medications: No  Patient reports access/transportation concerns to their pharmacy: No  Patient reports adherence concerns with their medications:  No    Reports he has signed up for a Medicare Part D plan to start in January.    Diabetes:  Current medications: metformin 1000 mg twice daily prescribed, though he has only been taking 500 mg in the morning, and usually at least 500 mg in the afternoon, sometimes forgetting 500 mg in the evening, Tresiba 24 units daily  Current glucose readings: fastings: 150-230s; post prandial: 250s; reports he skipped lunch yesterday and felt tired in the afternoon, glucose was 105. No other lower readings.   Denies diarrhea/GI upset.   Health Maintenance  Health Maintenance Due  Topic Date Due   OPHTHALMOLOGY EXAM  Never done   Zoster Vaccines- Shingrix (1 of 2) Never done   COVID-19 Vaccine (5 - 2023-24 season) 07/16/2022     Objective: Lab Results  Component Value Date   HGBA1C 14.9 (H) 09/08/2022    Lab Results  Component Value Date   CREATININE 0.78 10/15/2022   BUN 10 10/15/2022   NA 137 10/15/2022   K 4.1 10/15/2022   CL 105 10/15/2022   CO2 24 10/15/2022     Lab Results  Component Value Date   CHOL 135 09/08/2022   HDL 34.10 (L) 09/08/2022   LDLCALC 43 06/10/2022   LDLDIRECT 53.0 09/08/2022   TRIG 333.0 (H) 09/08/2022   CHOLHDL 4 09/08/2022    Medications Reviewed Today     Reviewed by DMelanee Left RN (Registered Nurse) on 10/14/22 at 117 Med List Status: RN Complete   Medication Order Taking? Sig Documenting Provider Last Dose Status Informant  allopurinol (ZYLOPRIM) 300 MG tablet [id number] TAKE 1 TABLET BY MOUTH EVERY DAY  Patient taking differently: Take 300 mg by mouth every morning.   SEinar Pheasant MD  Active   atorvastatin (LIPITOR) 40 MG tablet [id number] Take 1 tablet (40 mg total) by mouth daily.  Patient taking differently: Take 40 mg by mouth every morning.   SEinar Pheasant MD  Active   Continuous Blood Gluc Sensor (FREESTYLE LIBRE 3 SENSOR) [id number] Place 1 sensor on the skin every 14 days. Use to check glucose continuously SEinar Pheasant MD  Active   insulin degludec (TRESIBA FLEXTOUCH) 200 UNIT/ML FlexTouch Pen [id number] Inject 24 Units into the skin daily.  Patient taking differently: Inject 24 Units into the skin every morning.   SEinar Pheasant MD  Active   metaxalone ([facility] 800 MG tablet [id number] Take 800 mg by mouth 3 (three) times daily. [provider]  Active   metFORMIN (GLUCOPHAGE) 500 MG tablet [id number] Take 2 tablets (1,000 mg total)  by mouth 2 (two) times daily with a meal. Einar Pheasant, MD  Active   metoprolol succinate (TOPROL-XL) 50 MG 24 hr tablet [id number] Yes TAKE 2 TABLETS BY MOUTH DAILY WITH OR IMMEDIATELY FOLLOWING A MEAL.  Patient taking differently: 100 mg every morning. TAKE 2 TABLETS BY MOUTH DAILY WITH OR IMMEDIATELY FOLLOWING A MEAL.   Einar Pheasant, MD  Active   oxyCODONE (ROXICODONE) 15 MG immediate release tablet [id number] Yes Take 15 mg by mouth every 4 (four) hours. [provider]  Active   pantoprazole (PROTONIX)  40 MG tablet [id number] Yes Take 1 tablet (40 mg total) by mouth 2 (two) times daily before a meal. Einar Pheasant, MD  Active   triamterene-hydrochlorothiazide (MAXZIDE-25) 37.5-25 MG tablet [id number] Yes TAKE 1 TABLET BY MOUTH EVERY DAY  Patient taking differently: Take 1 tablet by mouth every morning.   Einar Pheasant, MD  Active               Assessment/Plan:   Diabetes: - Currently controlled - Reviewed long term cardiovascular and renal outcomes of uncontrolled blood sugar - Reviewed goal A1c, goal fasting, and goal 2 hour post prandial glucose - Reiterated to increase metformin to 1000 mg twice daily. Recommend to continue Tresiba at 24 units at this time due to patient reports of symptomatic hypoglycemia.  - Recommend to check glucose twice daily, fasting and 2 hour post prandial   Follow Up Plan: in person visit in 3 weeks  [name] Hedwig Morton, PharmD, [place] [phone]

## 2022-10-30 ENCOUNTER — Other Ambulatory Visit: Payer: Self-pay | Admitting: Internal Medicine

## 2022-11-01 ENCOUNTER — Other Ambulatory Visit: Payer: Medicare Other

## 2022-11-02 ENCOUNTER — Other Ambulatory Visit: Payer: Self-pay

## 2022-11-02 ENCOUNTER — Ambulatory Visit (INDEPENDENT_AMBULATORY_CARE_PROVIDER_SITE_OTHER): Payer: Medicare Other | Admitting: Gastroenterology

## 2022-11-02 ENCOUNTER — Encounter: Payer: Self-pay | Admitting: Gastroenterology

## 2022-11-02 VITALS — BP 126/77 | HR 69 | Temp 97.5°F | Ht 67.0 in | Wt 214.0 lb

## 2022-11-02 DIAGNOSIS — R932 Abnormal findings on diagnostic imaging of liver and biliary tract: Secondary | ICD-10-CM | POA: Diagnosis not present

## 2022-11-02 DIAGNOSIS — K7469 Other cirrhosis of liver: Secondary | ICD-10-CM | POA: Diagnosis not present

## 2022-11-02 DIAGNOSIS — Z860100 Personal history of colon polyps, unspecified: Secondary | ICD-10-CM

## 2022-11-02 MED ORDER — NA SULFATE-K SULFATE-MG SULF 17.5-3.13-1.6 GM/177ML PO SOLN: 354.0000 mL | Freq: Once | ORAL | 0 refills | Status: AC

## 2022-11-02 NOTE — Progress Notes (Signed)
Cameron Bellows MD, MRCP(U.K) [address]  [place], [place] [address]  Main: [phone]  Fax: [phone]   Gastroenterology Consultation  Referring Provider:     Einar Pheasant, MD Primary Care Physician:  Cameron Pheasant, MD Primary Gastroenterologist:  Dr. Jonathon Sanchez  Reason for Consultation:    Liver cirrhosis        HPI:   Cameron Sanchez is a 51 y.o. y/o male referred for consultation & management  by Dr. Einar Pheasant, MD.     The patient has been referred for liver cirrhosis last seen at our office back in 2021 by Dr. Bonna Gains over 2 years back she was at that time seen for reflux as well as constipation.  05/19/2022 right upper quadrant ultrasound shows cirrhotic morphology of the liver 1.9 cm hyperechoic mass within the right hepatic lobe 06/15/2022 MRI of the liver shows simple cyst corresponding to the prior ultrasound no specific follow-up required and an intermediate suspicious lesion seen in the posterior right lobe of the liver recommend MRI in 6 months to assess for stability.  Hepatic steatosis and splenomegaly  03/11/2022 EGD showed multiple gastric polyps biopsy taken that showed there were hyperplastic.  A colonoscopy was performed on the same day on 03/11/2022 10 polyps were resected.  One of the polyps showed low-grade dysplasia present at the cauterized base of the largest polyp seen with recommended to have repeat colonoscopy in 6 to 8 months to ensure no polyp seen at the polypectomy site  He states the diagnosis of liver cirrhosis is new.  Was never aware of it previously.  No family history of liver disorders.  No prior history of alcohol excess consumption illegal drug use, incarceration, military service, blood transfusions.  Past Medical History:  Diagnosis Date   Allergy    Anemia    Aortic atherosclerosis (HCC)    Chronic back pain    Chronic back pain    Cirrhosis of liver (HCC)    Complication of anesthesia    hard time to wake  up x1   COVID-19    DM (diabetes mellitus), type 2 (HCC)    GERD (gastroesophageal reflux disease)    Gout    Hypercholesterolemia    Hypertension    Liver lesion    Migraines    Obesity    Sleep apnea    Subarachnoid hemorrhage ([place]) 11/2020   no surgery   Thrombocytopenia (HCC)    TIA (transient ischemic attack)     Past Surgical History:  Procedure Laterality Date   BACK SURGERY  08/12/2014   multiple   CHOLECYSTECTOMY     COLONOSCOPY WITH PROPOFOL N/A 03/11/2022   Procedure: COLONOSCOPY WITH PROPOFOL;  Surgeon: Cameron Bellows, MD;  Location[facility] ENDOSCOPY;  Service: Gastroenterology;  Laterality: N/A;   ESOPHAGOGASTRODUODENOSCOPY N/A 03/11/2022   Procedure: ESOPHAGOGASTRODUODENOSCOPY (EGD);  Surgeon: Cameron Bellows, MD;  Location: [facility] ENDOSCOPY;  Service: Gastroenterology;  Laterality: N/A;   KNEE ARTHROSCOPY Left    Dr HBK    Prior to Admission medications   Medication Sig Start Date End Date Taking? Authorizing Provider  allopurinol (ZYLOPRIM) 300 MG tablet TAKE 1 TABLET BY MOUTH EVERY DAY Patient taking differently: Take 300 mg by mouth every morning. 08/23/22   Cameron Pheasant, MD  atorvastatin (LIPITOR) 40 MG tablet Take 1 tablet (40 mg total) by mouth daily. Patient taking differently: Take 40 mg by mouth every morning. 10/12/22   Cameron Pheasant, MD  Continuous Blood Gluc Sensor (FREESTYLE [place]  3 SENSOR) MISC Place 1 sensor on the skin every 14 days. Use to check glucose continuously 10/06/22   Cameron Pheasant, MD  insulin degludec (TRESIBA FLEXTOUCH) 200 UNIT/ML FlexTouch Pen Inject 24 Units into the skin daily. Patient taking differently: Inject 24 Units into the skin every morning. 09/28/22   Cameron Pheasant, MD  metaxalone (SKELAXIN) 800 MG tablet Take 800 mg by mouth 3 (three) times daily.    [provider]  metFORMIN (GLUCOPHAGE) 500 MG tablet Take 2 tablets (1,000 mg total) by mouth 2 (two) times daily with a meal. Patient not taking: Reported on  11/02/2022 09/28/22   Cameron Pheasant, MD  metoprolol succinate (TOPROL-XL) 50 MG 24 hr tablet TAKE 2 TABLETS BY MOUTH DAILY WITH OR IMMEDIATELY FOLLOWING A MEAL. Patient taking differently: 100 mg every morning. TAKE 2 TABLETS BY MOUTH DAILY WITH OR IMMEDIATELY FOLLOWING A MEAL. 09/06/22   Cameron Pheasant, MD  oxyCODONE (ROXICODONE) 15 MG immediate release tablet Take 15 mg by mouth every 4 (four) hours. 04/20/22   [provider]  pantoprazole (PROTONIX) 40 MG tablet TAKE 1 TABLET (40 MG TOTAL) BY MOUTH TWICE A DAY BEFORE MEALS 11/01/22   Cameron Pheasant, MD  triamterene-hydrochlorothiazide (MAXZIDE-25) 37.5-25 MG tablet TAKE 1 TABLET BY MOUTH EVERY DAY Patient taking differently: Take 1 tablet by mouth every morning. 09/06/22   Cameron Pheasant, MD    Family History  Problem Relation Age of Onset   Cancer Father        germ cell (retroperitoneal)   Coronary artery disease Other        s/p stent (age 31)   Hypertension Paternal Grandfather    Kidney cancer Paternal Grandfather    Diabetes Paternal Grandfather    Diabetes Maternal Grandfather    Heart disease Cousin        MI - age 26     Social History   Tobacco Use   Smoking status: Never   Smokeless tobacco: Never  Vaping Use   Vaping Use: Never used  Substance Use Topics   Alcohol use: No    Alcohol/week: 0.0 standard drinks of alcohol   Drug use: No    Allergies as of 11/02/2022   (No Known Allergies)    Review of Systems:    All systems reviewed and negative except where noted in HPI.   Physical Exam:  BP 126/77 (BP Location: Left Arm, Patient Position: Sitting, Cuff Size: Normal)   Pulse 69   Temp (!) 97.5 F (36.4 C) (Oral)   Ht '5\' 7"'$  (1.702 m)   Wt 214 lb (97.1 kg)   BMI 33.52 kg/m  No LMP for male patient. Psych:  Alert and cooperative. Normal mood and affect. General:   Alert,  Well-developed, well-nourished, pleasant and cooperative in NAD Head:  Normocephalic and atraumatic. Eyes:  Sclera  clear, no icterus.   Conjunctiva pink. Ears:  Normal auditory acuity. Neurologic:  Alert and oriented x3;  grossly normal neurologically. Psych:  Alert and cooperative. Normal mood and affect.  Imaging Studies: No results found.  Assessment and Plan:   Cameron Sanchez is a 51 y.o. y/o male has been referred for cirrhosis the liver.  Incidental finding on recent ultrasound.  There is an indeterminant liver lesion seen on MRI which requires its follow-up.  Recent screening colonoscopy showed over 10 polyps.  EGD showed no esophageal varices.   Plan 1.  MRI of the liver to assess for stability of the liver lesion seen on MRI in 06/15/2022  to be scheduled in February 2023 2.  Full autoimmune and hepatitis workup 3.  Genetic testing for multiple tubular adenomas seen on recent colonoscopy in April 2023 4.  Repeat colonoscopy to be scheduled as a polyp was taken out which showed adenomatous tissue at the cauterized edge to check for residual tissue.   5.  At next visit we will discuss vaccination  I have discussed alternative options, risks & benefits,  which include, but are not limited to, bleeding, infection, perforation,respiratory complication & drug reaction.  The patient agrees with this plan & written consent will be obtained.    Follow up in 3 months  Dr Cameron Bellows MD,MRCP(U.K)

## 2022-11-03 ENCOUNTER — Encounter
Admission: RE | Admit: 2022-11-03 | Discharge: 2022-11-03 | Disposition: A | Discharge status: Home / Self Care | Payer: Medicare Other | Source: Ambulatory Visit | Attending: Surgery | Admitting: Surgery

## 2022-11-03 ENCOUNTER — Telehealth: Payer: Self-pay | Admitting: Internal Medicine

## 2022-11-03 ENCOUNTER — Other Ambulatory Visit: Payer: Self-pay

## 2022-11-03 DIAGNOSIS — E1149 Type 2 diabetes mellitus with other diabetic neurological complication: Secondary | ICD-10-CM

## 2022-11-03 MED ORDER — TRESIBA FLEXTOUCH 200 UNIT/ML ~~LOC~~ SOPN: 24.0000 [IU] | PEN_INJECTOR | SUBCUTANEOUS | 0 refills | Status: DC

## 2022-11-03 MED ORDER — FREESTYLE LIBRE 3 SENSOR MISC: 3 refills | Status: DC

## 2022-11-03 MED ORDER — RELION PEN NEEDLES 31G X 6 MM MISC: 0 refills | Status: DC

## 2022-11-03 NOTE — Telephone Encounter (Signed)
Prescription Request  11/03/2022  Is this a "Controlled Substance" medicine? No  LOV: 09/15/2022  What is the name of the medication or equipment? Continuous Blood Gluc Sensor (FREESTYLE LIBRE 3  SENSOR) MISC   Relion 68m x31 gauge needle  insulin degludec (TRESIBA FLEXTOUCH) 200 UNIT/ML FlexTouch Pen   Have you contacted your pharmacy to request a refill? No   Which pharmacy would you like this sent to?  [id number] [place] [facility] [address] [place]: [phone]: [phone] Patient notified that their request is being sent to the clinical staff for review and that they should receive a response within 2 business days.   Please advise at Mobile [phone])

## 2022-11-03 NOTE — Patient Instructions (Signed)
Your procedure is scheduled on:11-04-22 Thursday Report to the Registration Desk on the 1st floor of the [facility].Then proceed to the 2nd floor Surgery Desk To find out your arrival time, please call [phone] between 1PM - 3PM on:11-03-22 Wednesday If your arrival time is 6:00 am, do not arrive prior to that time as the [facility] entrance doors do not open until 6:00 am.   REMEMBER: Instructions that are not followed completely may result in serious medical risk, up to and including death; or upon the discretion of your surgeon and anesthesiologist your surgery may need to be rescheduled.   Do not eat food OR drink any liquids after midnight the night before surgery.  No gum chewing, lozengers or hard candies.   TAKE THESE MEDICATIONS THE MORNING OF SURGERY WITH A SIP OF WATER: -allopurinol (ZYLOPRIM)  -atorvastatin (LIPITOR)  -metaxalone (SKELAXIN)  -metoprolol succinate (TOPROL-XL)  -oxyCODONE (ROXICODONE)  -pantoprazole (PROTONIX)    Stop your metFORMIN (GLUCOPHAGE) 2 days prior to surgery-Last dose will be on 11-01-22 Monday   Do NOT take any Insulin the morning of surgery   One week prior to surgery: Stop Anti-inflammatories (NSAIDS) such as Advil, Aleve, Ibuprofen, Motrin, Naproxen, Naprosyn and Aspirin based products such as Excedrin, Goodys Powder, BC Powder.You may however, continue to take Tylenol if needed for pain up until the day of surgery.   Stop ANY OVER THE COUNTER supplements/vitamins until after surgery.   No Alcohol for 24 hours before or after surgery.   No Smoking including e-cigarettes for 24 hours prior to surgery.  No chewable tobacco products for at least 6 hours prior to surgery.  No nicotine patches on the day of surgery.   Do not use any "recreational" drugs for at least a week prior to your surgery.  Please be advised that the combination of cocaine and anesthesia may have negative outcomes, up to and including death. If you test positive  for cocaine, your surgery will be cancelled.   On the morning of surgery brush your teeth with toothpaste and water, you may rinse your mouth with mouthwash if you wish. Do not swallow any toothpaste or mouthwash.   Do not wear jewelry, make-up, hairpins, clips or nail polish.   Do not wear lotions, powders, or perfumes.    Do not shave body from the neck down 48 hours prior to surgery just in case you cut yourself which could leave a site for infection.  Also, freshly shaved skin may become irritated if using the CHG soap.   Contact lenses, hearing aids and dentures may not be worn into surgery.   Do not bring valuables to the hospital. [facility] is not responsible for any missing/lost belongings or valuables.    Fleets enema as directed. Do Fleet Enema the night prior to surgery and do another Fleet Enema the morning of surgery 2 hours prior to your arrival time to the hospital   Bring your C-PAP to the hospital with you   Notify your doctor if there is any change in your medical condition (cold, fever, infection).   Wear comfortable clothing (specific to your surgery type) to the hospital.   After surgery, you can help prevent lung complications by doing breathing exercises.  Take deep breaths and cough every 1-2 hours. Your doctor may order a device called an Incentive Spirometer to help you take deep breaths. When coughing or sneezing, hold a pillow firmly against your incision with both hands. This is called "splinting." Doing this  helps protect your incision. It also decreases belly discomfort.   If you are being admitted to the hospital overnight, leave your suitcase in the car. After surgery it may be brought to your room.   If you are being discharged the day of surgery, you will not be allowed to drive home. You will need a responsible adult (18 years or older) to drive you home and stay with you that night.    If you are taking public transportation, you will need to  have a responsible adult (18 years or older) with you. Please confirm with your physician that it is acceptable to use public transportation.    Please call the [facility] Dept. at [phone] if you have any questions about these instructions.   Surgery Visitation Policy:   Patients undergoing a surgery or procedure may have two family members or support persons with them as long as the person is not COVID-19 positive or experiencing its symptoms.    MASKING: Due to an increase in RSV rates and hospitalizations, in-patient care areas in which we serve newborns, infants and children, masks will be required for teammates and visitors.  Children ages 37 and under may not visit. This policy affects the following departments only:   [place] Postpartum area Mother Baby Unit Newborn nursery/Special care nursery   Other areas: Masks continue to be strongly recommended for [facility] teammates, visitors and patients in all other areas. Visitation is not restricted outside of the units listed above.

## 2022-11-03 NOTE — Telephone Encounter (Signed)
Sent - pt advised

## 2022-11-03 NOTE — Pre-Procedure Instructions (Signed)
Pre op phone interview with patient. Chart reviewed. Medical/surgical history, medications, psychosocial, ADL's, and discharge plan reviewed with patient. Verbal and written instructions thru MyChart given. Patient verbalized understanding. Aware to call with any questions/concerns.  

## 2022-11-04 ENCOUNTER — Ambulatory Visit
Admission: RE | Admit: 2022-11-04 | Discharge: 2022-11-04 | Disposition: A | Discharge status: Home / Self Care | Payer: Medicare Other | Attending: Surgery | Admitting: Surgery

## 2022-11-04 ENCOUNTER — Ambulatory Visit: Payer: Medicare Other | Admitting: Anesthesiology

## 2022-11-04 ENCOUNTER — Other Ambulatory Visit: Payer: Self-pay

## 2022-11-04 ENCOUNTER — Encounter: Payer: Self-pay | Admitting: Surgery

## 2022-11-04 ENCOUNTER — Encounter
Admission: RE | Disposition: A | Discharge status: Home / Self Care | Payer: Self-pay | Source: Home / Self Care | Attending: Surgery

## 2022-11-04 DIAGNOSIS — K219 Gastro-esophageal reflux disease without esophagitis: Secondary | ICD-10-CM | POA: Diagnosis not present

## 2022-11-04 DIAGNOSIS — E119 Type 2 diabetes mellitus without complications: Secondary | ICD-10-CM | POA: Diagnosis not present

## 2022-11-04 DIAGNOSIS — Z8719 Personal history of other diseases of the digestive system: Secondary | ICD-10-CM | POA: Insufficient documentation

## 2022-11-04 DIAGNOSIS — E109 Type 1 diabetes mellitus without complications: Secondary | ICD-10-CM | POA: Diagnosis not present

## 2022-11-04 DIAGNOSIS — G4730 Sleep apnea, unspecified: Secondary | ICD-10-CM | POA: Insufficient documentation

## 2022-11-04 DIAGNOSIS — Z79891 Long term (current) use of opiate analgesic: Secondary | ICD-10-CM | POA: Insufficient documentation

## 2022-11-04 DIAGNOSIS — K7460 Unspecified cirrhosis of liver: Secondary | ICD-10-CM | POA: Insufficient documentation

## 2022-11-04 DIAGNOSIS — G8929 Other chronic pain: Secondary | ICD-10-CM | POA: Insufficient documentation

## 2022-11-04 DIAGNOSIS — K648 Other hemorrhoids: Secondary | ICD-10-CM | POA: Insufficient documentation

## 2022-11-04 DIAGNOSIS — K602 Anal fissure, unspecified: Secondary | ICD-10-CM | POA: Diagnosis not present

## 2022-11-04 DIAGNOSIS — Z833 Family history of diabetes mellitus: Secondary | ICD-10-CM | POA: Diagnosis not present

## 2022-11-04 DIAGNOSIS — I10 Essential (primary) hypertension: Secondary | ICD-10-CM | POA: Insufficient documentation

## 2022-11-04 DIAGNOSIS — Z8249 Family history of ischemic heart disease and other diseases of the circulatory system: Secondary | ICD-10-CM | POA: Insufficient documentation

## 2022-11-04 DIAGNOSIS — Z01818 Encounter for other preprocedural examination: Secondary | ICD-10-CM

## 2022-11-04 DIAGNOSIS — Z794 Long term (current) use of insulin: Secondary | ICD-10-CM | POA: Diagnosis not present

## 2022-11-04 HISTORY — PX: BOTOX INJECTION: SHX5754

## 2022-11-04 HISTORY — PX: SPHINCTEROTOMY: SHX5279

## 2022-11-04 LAB — GLUCOSE, CAPILLARY
Glucose-Capillary: 161 mg/dL — ABNORMAL HIGH (ref 70–99)
Glucose-Capillary: 137 mg/dL — ABNORMAL HIGH (ref 70–99)

## 2022-11-04 SURGERY — EXAM UNDER ANESTHESIA
Anesthesia: Monitor Anesthesia Care | Site: Rectum

## 2022-11-04 MED ORDER — DEXMEDETOMIDINE HCL IN NACL 80 MCG/20ML IV SOLN
INTRAVENOUS | Status: DC | PRN
  Administered 2022-11-04 (×2): 8 ug via BUCCAL

## 2022-11-04 MED ORDER — CELECOXIB 200 MG PO CAPS: 200.0000 mg | ORAL_CAPSULE | ORAL | Status: AC

## 2022-11-04 MED ORDER — ONABOTULINUMTOXINA (COSMETIC) 100 UNITS IM SOLR
50.0000 [IU] | Freq: Once | INTRAMUSCULAR | Status: DC
  Filled 2022-11-04: qty 1

## 2022-11-04 MED ORDER — MIDAZOLAM HCL 2 MG/2ML IJ SOLN
INTRAMUSCULAR | Status: AC
  Filled 2022-11-04: qty 2

## 2022-11-04 MED ORDER — HYDROCODONE-ACETAMINOPHEN 5-325 MG PO TABS: 1.0000 | ORAL_TABLET | Freq: Four times a day (QID) | ORAL | 0 refills | Status: DC | PRN

## 2022-11-04 MED ORDER — SODIUM CHLORIDE 0.9 % IV SOLN
INTRAVENOUS | Status: AC
  Filled 2022-11-04: qty 2

## 2022-11-04 MED ORDER — GABAPENTIN 300 MG PO CAPS: 300.0000 mg | ORAL_CAPSULE | ORAL | Status: AC

## 2022-11-04 MED ORDER — SODIUM CHLORIDE 0.9 % IV SOLN
2.0000 g | INTRAVENOUS | Status: AC
  Administered 2022-11-04: 2 g via INTRAVENOUS

## 2022-11-04 MED ORDER — BUPIVACAINE LIPOSOME 1.3 % IJ SUSP
INTRAMUSCULAR | Status: DC | PRN
  Administered 2022-11-04: 15 mL

## 2022-11-04 MED ORDER — CELECOXIB 200 MG PO CAPS
ORAL_CAPSULE | ORAL | Status: AC
  Administered 2022-11-04: 200 mg via ORAL
  Filled 2022-11-04: qty 1

## 2022-11-04 MED ORDER — ONABOTULINUMTOXINA 100 UNITS IJ SOLR
50.0000 [IU] | Freq: Once | INTRAMUSCULAR | Status: DC
  Filled 2022-11-04: qty 100

## 2022-11-04 MED ORDER — CHLORHEXIDINE GLUCONATE CLOTH 2 % EX PADS
6.0000 | MEDICATED_PAD | Freq: Once | CUTANEOUS | Status: AC
  Administered 2022-11-04: 6 via TOPICAL

## 2022-11-04 MED ORDER — ACETAMINOPHEN 500 MG PO TABS: 1000.0000 mg | ORAL_TABLET | ORAL | Status: AC

## 2022-11-04 MED ORDER — MIDAZOLAM HCL 2 MG/2ML IJ SOLN
INTRAMUSCULAR | Status: DC | PRN
  Administered 2022-11-04: 2 mg via INTRAVENOUS

## 2022-11-04 MED ORDER — CHLORHEXIDINE GLUCONATE CLOTH 2 % EX PADS: 6.0000 | MEDICATED_PAD | Freq: Once | CUTANEOUS | Status: DC

## 2022-11-04 MED ORDER — ONABOTULINUMTOXINA 100 UNITS IJ SOLR
INTRAMUSCULAR | Status: DC | PRN
  Administered 2022-11-04: 50 [IU] via INTRAMUSCULAR

## 2022-11-04 MED ORDER — PROPOFOL 500 MG/50ML IV EMUL
INTRAVENOUS | Status: DC | PRN
  Administered 2022-11-04: 60 mg via INTRAVENOUS

## 2022-11-04 MED ORDER — KETAMINE HCL 50 MG/5ML IJ SOSY
PREFILLED_SYRINGE | INTRAMUSCULAR | Status: AC
  Filled 2022-11-04: qty 5

## 2022-11-04 MED ORDER — LIDOCAINE HCL (CARDIAC) PF 100 MG/5ML IV SOSY
PREFILLED_SYRINGE | INTRAVENOUS | Status: DC | PRN
  Administered 2022-11-04: 100 mg via INTRAVENOUS

## 2022-11-04 MED ORDER — SODIUM CHLORIDE (PF) 0.9 % IJ SOLN
INTRAMUSCULAR | Status: AC
  Filled 2022-11-04: qty 10

## 2022-11-04 MED ORDER — ORAL CARE MOUTH RINSE: 15.0000 mL | Freq: Once | OROMUCOSAL | Status: AC

## 2022-11-04 MED ORDER — GABAPENTIN 300 MG PO CAPS
ORAL_CAPSULE | ORAL | Status: AC
  Administered 2022-11-04: 300 mg via ORAL
  Filled 2022-11-04: qty 1

## 2022-11-04 MED ORDER — SODIUM CHLORIDE 0.9 % IV SOLN: INTRAVENOUS | Status: DC

## 2022-11-04 MED ORDER — ONDANSETRON HCL 4 MG/2ML IJ SOLN: 4.0000 mg | Freq: Once | INTRAMUSCULAR | Status: DC | PRN

## 2022-11-04 MED ORDER — CHLORHEXIDINE GLUCONATE 0.12 % MT SOLN
15.0000 mL | Freq: Once | OROMUCOSAL | Status: AC
  Administered 2022-11-04: 15 mL via OROMUCOSAL

## 2022-11-04 MED ORDER — FENTANYL CITRATE (PF) 100 MCG/2ML IJ SOLN: 25.0000 ug | INTRAMUSCULAR | Status: DC | PRN

## 2022-11-04 MED ORDER — ACETAMINOPHEN 500 MG PO TABS
ORAL_TABLET | ORAL | Status: AC
  Administered 2022-11-04: 1000 mg via ORAL
  Filled 2022-11-04: qty 2

## 2022-11-04 MED ORDER — BUPIVACAINE LIPOSOME 1.3 % IJ SUSP
INTRAMUSCULAR | Status: AC
  Filled 2022-11-04: qty 20

## 2022-11-04 SURGICAL SUPPLY — 28 items
BLADE SURG 15 STRL LF DISP TIS (BLADE) ×1 IMPLANT
BLADE SURG 15 STRL SS (BLADE)
BRIEF MESH DISP 2XL (UNDERPADS AND DIAPERS) ×1 IMPLANT
DRAPE 3/4 80X56 (DRAPES) ×1 IMPLANT
DRAPE LEGGINS SURG 28X43 STRL (DRAPES) ×1 IMPLANT
DRAPE UNDER BUTTOCK W/FLU (DRAPES) ×1 IMPLANT
ELECT CAUTERY BLADE 6.4 (BLADE) ×1 IMPLANT
ELECT REM PT RETURN 9FT ADLT (ELECTROSURGICAL)
ELECTRODE REM PT RTRN 9FT ADLT (ELECTROSURGICAL) ×1 IMPLANT
GAUZE 4X4 16PLY ~~LOC~~+RFID DBL (SPONGE) ×1 IMPLANT
GLOVE BIO SURGEON STRL SZ7 (GLOVE) ×1 IMPLANT
GOWN STRL REUS W/ TWL LRG LVL3 (GOWN DISPOSABLE) ×2 IMPLANT
GOWN STRL REUS W/TWL LRG LVL3 (GOWN DISPOSABLE) ×2
KIT TURNOVER CYSTO (KITS) ×1 IMPLANT
NDL HPO THNWL 1X22GA REG BVL (NEEDLE) ×2 IMPLANT
NEEDLE HYPO 22GX1.5 SAFETY (NEEDLE) ×1 IMPLANT
NEEDLE SAFETY 22GX1 (NEEDLE) ×2
NS IRRIG 1000ML POUR BTL (IV SOLUTION) ×1 IMPLANT
PACK BASIN MINOR ARMC (MISCELLANEOUS) ×1 IMPLANT
PAD OB MATERNITY 4.3X12.25 (PERSONAL CARE ITEMS) ×1 IMPLANT
PAD PREP 24X41 OB/GYN DISP (PERSONAL CARE ITEMS) ×1 IMPLANT
SOL PREP PVP 2OZ (MISCELLANEOUS) ×1
SOLUTION PREP PVP 2OZ (MISCELLANEOUS) ×1 IMPLANT
SPONGE T-LAP 18X18 ~~LOC~~+RFID (SPONGE) ×1 IMPLANT
SURGILUBE 2OZ TUBE FLIPTOP (MISCELLANEOUS) ×1 IMPLANT
SYR 10ML LL (SYRINGE) ×1 IMPLANT
SYR 20ML LL LF (SYRINGE) ×1 IMPLANT
TRAP FLUID SMOKE EVACUATOR (MISCELLANEOUS) ×1 IMPLANT

## 2022-11-04 NOTE — Op Note (Signed)
  PRE-OPERATIVE DIAGNOSIS:  Anal fissure  POST-OPERATIVE DIAGNOSIS:  Anal fissure  PROCEDURE:   1. Anorectal Exam under Anesthesia 2. Chemical Lateral internal Sphincterotomy using 50 IU Botox  SURGEON:  Surgeon(s) and Role:    * Aiden Helzer F, MD - Primary  FINDINGS: posterior midline fissure  EBL: minimal  ANESTHESIA: General MAC  DICTATION:  Patient was explained about the  Procedure in detail. Risks, benefits and possible complications ( including but not limited to recurrence, transient incontinence, pain, bleeding)  and a consent was obtained. The patient taken to the operating room and placed in the lithotomy position.   Exam under anesthesia using the anal speculum revealed a posterior midline fissure and some skin tags.  There was internal hemorrhoids but they were not significant.  No other intraluminal lesions were observed. Digital palpation was performed identifying the Internal Sphincter muscle and on the lateral aspect and injected 50 international units of Botox in the standard fashion. Liposomal Marcaine  was injected around the perianal site. Needle and laparotomy counts were correct and there were no immediate complications  Jules Husbands, MD, FACS

## 2022-11-04 NOTE — Progress Notes (Signed)
Opened in error

## 2022-11-04 NOTE — Discharge Instructions (Addendum)
Anal Fissure, Adult  An anal fissure is a small tear or crack in the tissue around the opening of the butt (anus). Bleeding from the tear or crack usually stops on its own within a few minutes. The bleeding may happen every time you poop (have a bowel movement) until the tear or crack heals. What are the causes? This condition is usually caused by passing a large or hard poop (stool). Other causes include: Trouble pooping (constipation). Passing watery poop (diarrhea). Inflammatory bowel disease (Crohn's disease or ulcerative colitis). Childbirth. Infections. Anal sex. What are the signs or symptoms? Symptoms of this condition include: Bleeding from the butt. Small amounts of blood on your poop. The blood coats the outside of the poop. It is not mixed with the poop. Small amounts of blood on the toilet paper or in the toilet after you poop. Pain when passing poop. Itching or irritation around the opening of the butt. How is this diagnosed? This condition may be diagnosed based on a physical exam. Your doctor may: Check your butt. A tear can often be seen by checking the area with care. Check your butt using a short tube (anoscope). The light in the tube will show any problems in your butt. How is this treated? Treatment for this condition may include: Treating problems that make it hard for you to pass poop. You may be told to: Eat more fiber. Drink more fluid. Take fiber supplements. Take medicines that make poop soft. Taking warm water baths (sitz baths). This may help to heal the tear. Using creams and ointments. If your condition gets worse, other treatments may be needed such as: A shot near the tear or crack (botulinum injection). Surgery to repair the tear or crack. Follow these instructions at home: Eating and drinking  Avoid bananas and dairy products. These foods can make it hard to poop. Drink enough fluid to keep your pee (urine) pale yellow. Eat foods that have a  lot of fiber in them, such as: Beans. Whole grains. Fresh fruits. Fresh vegetables. General instructions  Take over-the-counter and prescription medicines only as told by your doctor. Use creams or ointments only as told by your doctor. Keep the butt area as clean and dry as you can. Take a warm water bath as told by your doctor. Do not use soap. Keep all follow-up visits as told by your doctor. This is important. Contact a doctor if: You have more bleeding. You have a fever. You have watery poop that is mixed with blood. You have pain. Your problem gets worse, not better. Summary An anal fissure is a small tear or crack in the skin around the opening of the butt (anus). This condition is usually caused by passing a large or hard poop (stool). Treatment includes treating the problems that make it hard for you to pass poop. Follow your doctor's instructions about caring for your condition at home. Keep all follow-up visits as told by your doctor. This is important. This information is not intended to replace advice given to you by your health care provider. Make sure you discuss any questions you have with your health care provider. Document Revised: 05/28/2021 Document Reviewed: 05/28/2021 Elsevier Patient Education  [place]   The drugs that you were given will stay in your system until tomorrow so for the next 24 hours you should not:  Drive an automobile Make any legal decisions Drink any alcoholic beverage   You may  resume regular meals tomorrow.  Today it is better to start with liquids and gradually work up to solid foods.  You may eat anything you prefer, but it is better to start with liquids, then soup and crackers, and gradually work up to solid foods.   Please notify your doctor immediately if you have any unusual bleeding, trouble breathing, redness and pain at the surgery site, drainage, fever, or pain  not relieved by medication.    Additional Instructions:   Please contact your physician with any problems or Same Day Surgery at [phone], Monday through Friday 6 am to 4 pm, or [place] at [facility] number at [phone].

## 2022-11-04 NOTE — H&P (Signed)
Cameron Sanchez is an 51 y.o. male.       Chief Complaint  Patient presents with   Follow-up      HPI:    Cameron Sanchez is a 51 y.o. male following for fissure. He feels like passing sharp blades when having bm.  He also has intermittent hematochezia. He has some improvement w nifedipine cream but still has significant sxs. He does have chronic back pain and takes 15 mg of oxycodone 4 times a day. He did have a CBC that was normal and CMP with uncontrolled sugars up to 481. He did have an ultrasound as well as an MRI that have personally reviewed showing evidence of a small segment 6 hepatic lesion measuring less than 1 cm.  There was also evidence of cirrhosis, splenomegaly. 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  Please note that I personally reviewed the images. Recs of 6 month repeat colonoscopy given        Past Medical History:  Diagnosis Date   Allergy     Chronic back pain     Diabetes mellitus without complication ([place])     Hypercholesterolemia     Hypertension     Migraines             Past Surgical History:  Procedure Laterality Date   BACK SURGERY   08/12/14    multiple   CHOLECYSTECTOMY       COLONOSCOPY WITH PROPOFOL N/A 03/11/2022    Procedure: COLONOSCOPY WITH PROPOFOL;  Surgeon: [name], MD;  Location: [facility] ENDOSCOPY;  Service: Gastroenterology;  Laterality: N/A;   ESOPHAGOGASTRODUODENOSCOPY N/A 03/11/2022    Procedure: ESOPHAGOGASTRODUODENOSCOPY (EGD);  Surgeon: [name], MD;  Location: [facility] ENDOSCOPY;  Service: Gastroenterology;  Laterality: N/A;   KNEE ARTHROSCOPY        Dr HBK           Family History  Problem Relation Age of Onset   Cancer Father          germ cell (retroperitoneal)   Coronary artery disease Other          s/p stent (age 25)   Hypertension Paternal Grandfather     Kidney cancer Paternal Grandfather      Diabetes Paternal Grandfather     Diabetes Maternal Grandfather     Heart disease Cousin          MI - age 91      Social History:  reports that he has never smoked. He has never used smokeless tobacco. He reports that he does not drink alcohol and does not use drugs.   Allergies: No Known Allergies   Medications reviewed.       ROS Full ROS performed and is otherwise negative other than what is stated in HPI  Physical Exam Chaperone present CONSTITUTIONAL: NAD. EYES: Pupils are equal, round, Sclera are non-icteric. EARS, NOSE, MOUTH AND THROAT:  The oral mucosa is pink and moist. Hearing is intact to voice. LYMPH NODES:  Lymph nodes in the neck are normal. RESPIRATORY:  Lungs are clear. There is normal respiratory effort, with equal breath sounds bilaterally, and without pathologic use of accessory muscles. CARDIOVASCULAR: Heart is regular without murmurs, gallops, or rubs. GI: The abdomen is  soft, nontender, and nondistended. There are no palpable masses. There is no hepatosplenomegaly. There are normal bowel sounds in all quadrants. Rectal: evidence  of large posterior midline fissure, no masses, some skin tags of little clinical  significance.   MUSCULOSKELETAL: Normal muscle strength and tone. No cyanosis or edema.   SKIN: Turgor is good and there are no pathologic skin lesions or ulcers. NEUROLOGIC: Motor and sensation is grossly normal. Cranial nerves are grossly intact. PSYCH:  Oriented to person, place and time. Affect is normal.   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  Discussed with patient in detail about his disease process.  Discussed about medical management to include fiber, sitz bath's and nifedipine cream.  He still has persistent sxs despite medical rx. Discussed with him in detail about the role of chemical sphincterotomy with Botox. Risks, benefits and possible complications including but not limited to bleeding, infection, incontinence, recurrence  and pain. He is in agreement with the surgical plan

## 2022-11-04 NOTE — Transfer of Care (Signed)
Immediate Anesthesia Transfer of Care Note  Patient: Cameron Sanchez  Procedure(s) Performed: Jasmine December UNDER ANESTHESIA (Rectum) SPHINCTEROTOMY (Rectum) BOTOX INJECTION (Rectum)  Patient Location: PACU  Anesthesia Type:MAC  Level of Consciousness: sedated  Airway & Oxygen Therapy: Patient Spontanous Breathing  Post-op Assessment: Report given to RN and Post -op Vital signs reviewed and stable  Post vital signs: Reviewed and stable  Last Vitals:  Vitals Value Taken Time  BP 127/75 11/04/22 1533  Temp    Pulse 73 11/04/22 1536  Resp 13 11/04/22 1536  SpO2 97 % 11/04/22 1536  Vitals shown include unvalidated device data.  Last Pain:  Vitals:   11/04/22 1315  TempSrc: Temporal  PainSc: 6          Complications: No notable events documented.

## 2022-11-04 NOTE — Anesthesia Preprocedure Evaluation (Signed)
Anesthesia Evaluation  Patient identified by MRN, date of birth, ID band Patient awake    Reviewed: Allergy & Precautions, NPO status , Patient's Chart, lab work & pertinent test results  Airway Mallampati: II  TM Distance: >3 FB Neck ROM: full    Dental  (+) Teeth Intact   Pulmonary neg pulmonary ROS, sleep apnea    Pulmonary exam normal breath sounds clear to auscultation       Cardiovascular Exercise Tolerance: Good hypertension, Pt. on medications negative cardio ROS Normal cardiovascular exam Rhythm:Regular     Neuro/Psych TIAnegative neurological ROS  negative psych ROS   GI/Hepatic negative GI ROS, Neg liver ROS,GERD  Medicated,,  Endo/Other  negative endocrine ROSdiabetes, Type 1, Insulin Dependent    Renal/GU      Musculoskeletal   Abdominal  (+) + obese  Peds negative pediatric ROS (+)  Hematology negative hematology ROS (+) Blood dyscrasia, anemia   Anesthesia Other Findings Past Medical History: No date: Allergy No date: Anemia No date: Aortic atherosclerosis (HCC) No date: Chronic back pain No date: Chronic back pain No date: Cirrhosis of liver (HCC) No date: Complication of anesthesia     Comment:  hard time to wake up x1 No date: COVID-19 No date: DM (diabetes mellitus), type 2 (HCC) No date: GERD (gastroesophageal reflux disease) No date: Gout No date: Hypercholesterolemia No date: Hypertension No date: Liver lesion No date: Migraines No date: Obesity No date: Sleep apnea 11/2020: Subarachnoid hemorrhage (HCC)     Comment:  no surgery No date: Thrombocytopenia ([place]) No date: TIA (transient ischemic attack)  Past Surgical History: 08/12/2014: BACK SURGERY     Comment:  multiple No date: CHOLECYSTECTOMY 03/11/2022: COLONOSCOPY WITH PROPOFOL; N/A     Comment:  Procedure: COLONOSCOPY WITH PROPOFOL;  Surgeon: [name], MD;  Location: [facility] ENDOSCOPY;  Service:                Gastroenterology;  Laterality: N/A; 03/11/2022: ESOPHAGOGASTRODUODENOSCOPY; N/A     Comment:  Procedure: ESOPHAGOGASTRODUODENOSCOPY (EGD);  Surgeon:               [name], MD;  Location: [facility] ENDOSCOPY;  Service:               Gastroenterology;  Laterality: N/A; No date: KNEE ARTHROSCOPY; Left     Comment:  Dr HBK  BMI    Body Mass Index: 32.55 kg/m      Reproductive/Obstetrics negative OB ROS                             Anesthesia Physical Anesthesia Plan  ASA: 3  Anesthesia Plan: MAC   Post-op Pain Management:    Induction: Intravenous  PONV Risk Score and Plan:   Airway Management Planned: Natural Airway  Additional Equipment:   Intra-op Plan:   Post-operative Plan:   Informed Consent: I have reviewed the patients History and Physical, chart, labs and discussed the procedure including the risks, benefits and alternatives for the proposed anesthesia with the patient or authorized representative who has indicated his/her understanding and acceptance.       Plan Discussed with: CRNA and Surgeon  Anesthesia Plan Comments:        Anesthesia Quick Evaluation

## 2022-11-05 ENCOUNTER — Other Ambulatory Visit: Payer: Self-pay

## 2022-11-05 ENCOUNTER — Encounter: Payer: Self-pay | Admitting: Surgery

## 2022-11-05 DIAGNOSIS — E1149 Type 2 diabetes mellitus with other diabetic neurological complication: Secondary | ICD-10-CM

## 2022-11-05 LAB — CK: Total CK: 55 U/L (ref 41–331)

## 2022-11-05 LAB — IMMUNOGLOBULINS A/E/G/M, SERUM
IgA/Immunoglobulin A, Serum: 264 mg/dL (ref 90–386)
IgE (Immunoglobulin E), Serum: 86 IU/mL (ref 6–495)
IgG (Immunoglobin G), Serum: 1164 mg/dL (ref 603–1613)
IgM (Immunoglobulin M), Srm: 183 mg/dL — ABNORMAL HIGH (ref 20–172)

## 2022-11-05 LAB — CERULOPLASMIN: Ceruloplasmin: 22.4 mg/dL (ref 16.0–31.0)

## 2022-11-05 LAB — MITOCHONDRIAL/SMOOTH MUSCLE AB PNL
Mitochondrial Ab: <20 Units (ref 0.0–20.0)
Smooth Muscle Ab: 6 Units (ref 0–19)

## 2022-11-05 LAB — HEPATITIS A ANTIBODY, TOTAL: hep A Total Ab: POSITIVE — AB

## 2022-11-05 LAB — HEPATITIS B CORE ANTIBODY, TOTAL: Hep B Core Total Ab: NEGATIVE

## 2022-11-05 LAB — CELIAC DISEASE AB SCREEN W/RFX
Antigliadin Abs, IgA: 3 units (ref 0–19)
Transglutaminase IgA: <2 U/mL (ref 0–3)

## 2022-11-05 LAB — ANTI-MICROSOMAL ANTIBODY LIVER / KIDNEY: LKM1 Ab: 2.1 Units (ref 0.0–20.0)

## 2022-11-05 LAB — IRON,TIBC AND FERRITIN PANEL
Ferritin: 18 ng/mL — ABNORMAL LOW (ref 30–400)
Iron Saturation: 13 % — ABNORMAL LOW (ref 15–55)
Iron: 60 ug/dL (ref 38–169)
Total Iron Binding Capacity: 459 ug/dL — ABNORMAL HIGH (ref 250–450)
UIBC: 399 ug/dL — ABNORMAL HIGH (ref 111–343)

## 2022-11-05 LAB — HEPATITIS B SURFACE ANTIBODY,QUALITATIVE: Hep B Surface Ab, Qual: NONREACTIVE

## 2022-11-05 LAB — HEPATITIS C ANTIBODY: Hep C Virus Ab: NONREACTIVE

## 2022-11-05 LAB — HEPATITIS B SURFACE ANTIGEN: Hepatitis B Surface Ag: NEGATIVE

## 2022-11-05 LAB — HEPATITIS B E ANTIBODY: Hep B E Ab: NEGATIVE

## 2022-11-05 LAB — ANA: Anti Nuclear Antibody (ANA): NEGATIVE

## 2022-11-05 LAB — ALPHA-1-ANTITRYPSIN: A-1 Antitrypsin: 162 mg/dL (ref 101–187)

## 2022-11-05 LAB — AFP TUMOR MARKER: AFP, Serum, Tumor Marker: 2.5 ng/mL (ref 0.0–8.4)

## 2022-11-05 LAB — HEPATITIS B E ANTIGEN: Hep B E Ag: NEGATIVE

## 2022-11-05 MED ORDER — FREESTYLE LIBRE 3 SENSOR MISC: 3 refills | Status: DC

## 2022-11-05 NOTE — Anesthesia Postprocedure Evaluation (Signed)
Anesthesia Post Note  Patient: Cameron Sanchez  Procedure(s) Performed: Jasmine December UNDER ANESTHESIA (Rectum) SPHINCTEROTOMY (Rectum) BOTOX INJECTION (Rectum)  Patient location during evaluation: PACU Level of consciousness: awake Pain management: pain level controlled Vital Signs Assessment: post-procedure vital signs reviewed and stable Respiratory status: spontaneous breathing and nonlabored ventilation Cardiovascular status: stable Anesthetic complications: no  No notable events documented.   Last Vitals:  Vitals:   11/04/22 1615 11/04/22 1624  BP:  128/76  Pulse: 71 69  Resp: (!) 9 15  Temp:  36.9 C  SpO2: 99% 100%    Last Pain:  Vitals:   11/04/22 1624  TempSrc: Temporal  PainSc: 0-No pain                 VAN STAVEREN,Breah Joa

## 2022-11-05 NOTE — Telephone Encounter (Signed)
sent 

## 2022-11-05 NOTE — Telephone Encounter (Signed)
Pt called stating his pharmacy does not have the libre sensor and he want it called into [facility] [address]

## 2022-11-12 ENCOUNTER — Other Ambulatory Visit: Payer: Self-pay

## 2022-11-12 ENCOUNTER — Telehealth: Payer: Self-pay

## 2022-11-12 DIAGNOSIS — E611 Iron deficiency: Secondary | ICD-10-CM

## 2022-11-12 MED ORDER — FERROUS SULFATE 325 (65 FE) MG PO TABS
325.0000 mg | ORAL_TABLET | ORAL | 1 refills | Status: DC
  Filled 2022-11-12: qty 15, 30d supply, fill #0

## 2022-11-12 NOTE — Progress Notes (Signed)
1. Needs hep B vaccine 2. Iron low start on iron pills ferrous sulphate 325 mg one every other day  3. Check cbc in 4 weeks

## 2022-11-12 NOTE — Telephone Encounter (Signed)
-----   Message from [name], MD sent at 11/12/2022  9:24 AM EST ----- 1. Needs hep B vaccine 2. Iron low start on iron pills ferrous sulphate 325 mg one every other day  3. Check cbc in 4 weeks

## 2022-11-12 NOTE — Telephone Encounter (Signed)
Called patient but had to leave him a voicemail letting him know the below information. I let him know that his frrous sulphate 325 MG tablets would be sent to his pharmacy so he could start taking every other day. I also let him know to give [place] a call back so he could schedule a nurse visit to get his Hepatitis B vaccine or to receive it at his PCP's office. Lastly, I let him know that Dr. Vicente Males would like to check his labs again to make sure that he is getting better with his iron.

## 2022-11-16 ENCOUNTER — Encounter: Payer: Self-pay | Admitting: Gastroenterology

## 2022-11-17 ENCOUNTER — Other Ambulatory Visit: Payer: Self-pay

## 2022-11-17 ENCOUNTER — Ambulatory Visit
Admission: RE | Admit: 2022-11-17 | Discharge: 2022-11-17 | Disposition: A | Discharge status: Home / Self Care | Payer: Medicare HMO | Attending: Gastroenterology | Admitting: Gastroenterology

## 2022-11-17 ENCOUNTER — Encounter: Payer: Medicare Other | Admitting: Surgery

## 2022-11-17 ENCOUNTER — Encounter
Admission: RE | Disposition: A | Discharge status: Home / Self Care | Payer: Self-pay | Source: Home / Self Care | Attending: Gastroenterology

## 2022-11-17 ENCOUNTER — Other Ambulatory Visit: Payer: Medicare Other | Admitting: Pharmacist

## 2022-11-17 DIAGNOSIS — Z01818 Encounter for other preprocedural examination: Secondary | ICD-10-CM | POA: Diagnosis not present

## 2022-11-17 DIAGNOSIS — Z86010 Personal history of colonic polyps: Secondary | ICD-10-CM | POA: Diagnosis not present

## 2022-11-17 DIAGNOSIS — Z538 Procedure and treatment not carried out for other reasons: Secondary | ICD-10-CM | POA: Diagnosis not present

## 2022-11-17 HISTORY — PX: COLONOSCOPY WITH PROPOFOL: SHX5780

## 2022-11-17 SURGERY — COLONOSCOPY WITH PROPOFOL
Anesthesia: General

## 2022-11-17 MED ORDER — NA SULFATE-K SULFATE-MG SULF 17.5-3.13-1.6 GM/177ML PO SOLN
354.0000 mL | Freq: Once | ORAL | 0 refills | Status: DC
  Filled 2022-11-17: qty 354, fill #0

## 2022-11-17 MED ORDER — SODIUM CHLORIDE 0.9 % IV SOLN: INTRAVENOUS | Status: DC

## 2022-11-17 MED ORDER — NA SULFATE-K SULFATE-MG SULF 17.5-3.13-1.6 GM/177ML PO SOLN: 354.0000 mL | Freq: Once | ORAL | 0 refills | Status: AC

## 2022-11-17 NOTE — OR Nursing (Signed)
Patient did not drink all the prep d/t blood sugar dropping. Feels like he is not clean. Dr Vicente Males to reschedule for tomorrow

## 2022-11-18 ENCOUNTER — Encounter: Payer: Self-pay | Admitting: Gastroenterology

## 2022-11-19 DIAGNOSIS — M961 Postlaminectomy syndrome, not elsewhere classified: Secondary | ICD-10-CM | POA: Diagnosis not present

## 2022-11-19 DIAGNOSIS — Z79891 Long term (current) use of opiate analgesic: Secondary | ICD-10-CM | POA: Diagnosis not present

## 2022-11-19 DIAGNOSIS — M5137 Other intervertebral disc degeneration, lumbosacral region: Secondary | ICD-10-CM | POA: Diagnosis not present

## 2022-11-19 DIAGNOSIS — M545 Low back pain, unspecified: Secondary | ICD-10-CM | POA: Diagnosis not present

## 2022-11-19 DIAGNOSIS — G894 Chronic pain syndrome: Secondary | ICD-10-CM | POA: Diagnosis not present

## 2022-11-22 ENCOUNTER — Encounter: Payer: Self-pay | Admitting: Surgery

## 2022-11-22 ENCOUNTER — Ambulatory Visit (INDEPENDENT_AMBULATORY_CARE_PROVIDER_SITE_OTHER): Payer: Medicare HMO | Admitting: Surgery

## 2022-11-22 VITALS — BP 120/82 | HR 76 | Temp 98.0°F | Ht 67.0 in | Wt 216.0 lb

## 2022-11-22 DIAGNOSIS — K602 Anal fissure, unspecified: Secondary | ICD-10-CM

## 2022-11-22 NOTE — Patient Instructions (Addendum)
Try taking Miralax everyday to keep your bowel movements soft and easy.   Follow up here in 1 month.

## 2022-11-23 ENCOUNTER — Telehealth: Payer: Self-pay | Admitting: Pharmacist

## 2022-11-23 ENCOUNTER — Other Ambulatory Visit: Payer: Medicare Other | Admitting: Pharmacist

## 2022-11-23 NOTE — Progress Notes (Signed)
Attempted to contact patient to rescheduled appointment, I am unable to be in the office today. Rescheduled for in person on 1/19.   [name] Hedwig Morton, PharmD, [place], [facility] [phone]

## 2022-11-24 ENCOUNTER — Ambulatory Visit: Payer: Medicare HMO | Admitting: Certified Registered"

## 2022-11-24 ENCOUNTER — Ambulatory Visit
Admission: RE | Admit: 2022-11-24 | Discharge: 2022-11-24 | Disposition: A | Discharge status: Home / Self Care | Payer: Medicare HMO | Attending: Gastroenterology | Admitting: Gastroenterology

## 2022-11-24 ENCOUNTER — Encounter: Payer: Self-pay | Admitting: Gastroenterology

## 2022-11-24 ENCOUNTER — Encounter
Admission: RE | Disposition: A | Discharge status: Home / Self Care | Payer: Self-pay | Source: Home / Self Care | Attending: Gastroenterology

## 2022-11-24 DIAGNOSIS — K219 Gastro-esophageal reflux disease without esophagitis: Secondary | ICD-10-CM | POA: Insufficient documentation

## 2022-11-24 DIAGNOSIS — E119 Type 2 diabetes mellitus without complications: Secondary | ICD-10-CM | POA: Diagnosis not present

## 2022-11-24 DIAGNOSIS — G4730 Sleep apnea, unspecified: Secondary | ICD-10-CM | POA: Insufficient documentation

## 2022-11-24 DIAGNOSIS — D649 Anemia, unspecified: Secondary | ICD-10-CM | POA: Diagnosis not present

## 2022-11-24 DIAGNOSIS — Z794 Long term (current) use of insulin: Secondary | ICD-10-CM | POA: Diagnosis not present

## 2022-11-24 DIAGNOSIS — Z860100 Personal history of colon polyps, unspecified: Secondary | ICD-10-CM

## 2022-11-24 DIAGNOSIS — Z6832 Body mass index (BMI) 32.0-32.9, adult: Secondary | ICD-10-CM | POA: Diagnosis not present

## 2022-11-24 DIAGNOSIS — D759 Disease of blood and blood-forming organs, unspecified: Secondary | ICD-10-CM | POA: Insufficient documentation

## 2022-11-24 DIAGNOSIS — I10 Essential (primary) hypertension: Secondary | ICD-10-CM | POA: Insufficient documentation

## 2022-11-24 DIAGNOSIS — Z1211 Encounter for screening for malignant neoplasm of colon: Secondary | ICD-10-CM | POA: Diagnosis not present

## 2022-11-24 DIAGNOSIS — E669 Obesity, unspecified: Secondary | ICD-10-CM | POA: Diagnosis not present

## 2022-11-24 HISTORY — PX: COLONOSCOPY WITH PROPOFOL: SHX5780

## 2022-11-24 LAB — GLUCOSE, CAPILLARY: Glucose-Capillary: 154 mg/dL — ABNORMAL HIGH (ref 70–99)

## 2022-11-24 SURGERY — COLONOSCOPY WITH PROPOFOL
Anesthesia: General

## 2022-11-24 MED ORDER — STERILE WATER FOR IRRIGATION IR SOLN
Status: DC | PRN
  Administered 2022-11-24 (×2): 60 mL

## 2022-11-24 MED ORDER — PROPOFOL 10 MG/ML IV BOLUS
INTRAVENOUS | Status: AC
  Filled 2022-11-24: qty 40

## 2022-11-24 MED ORDER — LIDOCAINE HCL (PF) 2 % IJ SOLN
INTRAMUSCULAR | Status: AC
  Filled 2022-11-24: qty 5

## 2022-11-24 MED ORDER — SODIUM CHLORIDE 0.9 % IV SOLN
INTRAVENOUS | Status: DC
  Administered 2022-11-24: 1000 mL via INTRAVENOUS

## 2022-11-24 MED ORDER — LIDOCAINE HCL (CARDIAC) PF 100 MG/5ML IV SOSY
PREFILLED_SYRINGE | INTRAVENOUS | Status: DC | PRN
  Administered 2022-11-24 (×2): 40 mg via INTRAVENOUS

## 2022-11-24 MED ORDER — PROPOFOL 500 MG/50ML IV EMUL
INTRAVENOUS | Status: DC | PRN
  Administered 2022-11-24: 150 ug/kg/min via INTRAVENOUS

## 2022-11-24 MED ORDER — PROPOFOL 10 MG/ML IV BOLUS
INTRAVENOUS | Status: DC | PRN
  Administered 2022-11-24 (×2): 50 mg via INTRAVENOUS

## 2022-11-24 NOTE — Anesthesia Postprocedure Evaluation (Signed)
Anesthesia Post Note  Patient: FED CECI  Procedure(s) Performed: COLONOSCOPY WITH PROPOFOL  Patient location during evaluation: Endoscopy Anesthesia Type: General Level of consciousness: awake and alert Pain management: pain level controlled Vital Signs Assessment: post-procedure vital signs reviewed and stable Respiratory status: spontaneous breathing, nonlabored ventilation, respiratory function stable and patient connected to nasal cannula oxygen Cardiovascular status: blood pressure returned to baseline and stable Postop Assessment: no apparent nausea or vomiting Anesthetic complications: no  No notable events documented.   Last Vitals:  Vitals:   11/24/22 0816 11/24/22 0916  BP: (!) 151/90 113/73  Pulse: 87 89  Resp: 18 15  Temp: (!) 36.2 C (!) 36.2 C  SpO2: 100% 100%    Last Pain:  Vitals:   11/24/22 0916  TempSrc: Temporal  PainSc: 0-No pain                 Ilene Qua

## 2022-11-24 NOTE — H&P (Signed)
Cameron Bellows, MD [address] [place], [place], [place], [address] [address], [place], [place], [place], [address] Phone: [phone]  Fax: [phone]  Primary Care Physician:  Einar Pheasant, MD   Pre-Procedure History & Physical: HPI:  Cameron Sanchez is a 52 y.o. male is here for an colonoscopy.   Past Medical History:  Diagnosis Date   Allergy    Anemia    Aortic atherosclerosis (HCC)    Chronic back pain    Chronic back pain    Cirrhosis of liver (HCC)    Complication of anesthesia    hard time to wake up x1   COVID-19    DM (diabetes mellitus), type 2 (HCC)    GERD (gastroesophageal reflux disease)    Gout    Hypercholesterolemia    Hypertension    Liver lesion    Migraines    Obesity    Sleep apnea    Subarachnoid hemorrhage ([place]) 11/2020   no surgery   Thrombocytopenia ([place])    TIA (transient ischemic attack)     Past Surgical History:  Procedure Laterality Date   BACK SURGERY  08/12/2014   multiple   BOTOX INJECTION N/A 11/04/2022   Procedure: BOTOX INJECTION;  Surgeon: Jules Husbands, MD;  Location: [facility] ORS;  Service: General;  Laterality: N/A;   CHOLECYSTECTOMY     COLONOSCOPY WITH PROPOFOL N/A 03/11/2022   Procedure: COLONOSCOPY WITH PROPOFOL;  Surgeon: Cameron Bellows, MD;  Location[facility] ENDOSCOPY;  Service: Gastroenterology;  Laterality: N/A;   COLONOSCOPY WITH PROPOFOL N/A 11/17/2022   Procedure: COLONOSCOPY WITH PROPOFOL;  Surgeon: Cameron Bellows, MD;  Location: [facility] ENDOSCOPY;  Service: Gastroenterology;  Laterality: N/A;   ESOPHAGOGASTRODUODENOSCOPY N/A 03/11/2022   Procedure: ESOPHAGOGASTRODUODENOSCOPY (EGD);  Surgeon: Cameron Bellows, MD;  Location: [facility] ENDOSCOPY;  Service: Gastroenterology;  Laterality: N/A;   KNEE ARTHROSCOPY Left    Dr Joellyn Rued   SPHINCTEROTOMY N/A 11/04/2022   Procedure: SPHINCTEROTOMY;  Surgeon: Jules Husbands, MD;  Location: [facility] ORS;  Service: General;  Laterality: N/A;    Prior to Admission medications   Medication Sig  Start Date End Date Taking? Authorizing Provider  allopurinol (ZYLOPRIM) 300 MG tablet TAKE 1 TABLET BY MOUTH EVERY DAY Patient taking differently: Take 300 mg by mouth every morning. 08/23/22  Yes Einar Pheasant, MD  atorvastatin (LIPITOR) 40 MG tablet Take 1 tablet (40 mg total) by mouth daily. Patient taking differently: Take 40 mg by mouth every morning. 10/12/22  Yes Einar Pheasant, MD  Continuous Blood Gluc Sensor (FREESTYLE LIBRE 3 SENSOR) MISC Place 1 sensor on the skin every 14 days. Use to check glucose continuously 11/05/22  Yes Einar Pheasant, MD  ferrous sulfate (FERROUSUL) 325 (65 FE) MG tablet Take 1 tablet (325 mg total) by mouth every other day. 11/12/22  Yes Cameron Bellows, MD  insulin degludec (TRESIBA FLEXTOUCH) 200 UNIT/ML FlexTouch Pen Inject 24 Units into the skin every morning. 11/03/22  Yes Einar Pheasant, MD  Insulin Pen Needle (RELION PEN NEEDLES) 31G X 6 MM MISC Use as directed 11/03/22  Yes Einar Pheasant, MD  metaxalone (SKELAXIN) 800 MG tablet Take 800 mg by mouth 3 (three) times daily.   Yes [provider]  metFORMIN (GLUCOPHAGE) 500 MG tablet Take 2 tablets (1,000 mg total) by mouth 2 (two) times daily with a meal. 09/28/22  Yes Einar Pheasant, MD  metoprolol succinate (TOPROL-XL) 50 MG 24 hr tablet TAKE 2 TABLETS BY MOUTH DAILY WITH OR IMMEDIATELY FOLLOWING A MEAL. Patient taking differently: 100  mg every morning. TAKE 2 TABLETS BY MOUTH DAILY WITH OR IMMEDIATELY FOLLOWING A MEAL. 09/06/22  Yes Einar Pheasant, MD  oxyCODONE (ROXICODONE) 15 MG immediate release tablet Take 15 mg by mouth every 4 (four) hours. 04/20/22  Yes [provider]  pantoprazole (PROTONIX) 40 MG tablet TAKE 1 TABLET (40 MG TOTAL) BY MOUTH TWICE A DAY BEFORE MEALS 11/01/22  Yes Einar Pheasant, MD  triamterene-hydrochlorothiazide (BJYNWGN-56) 37.5-25 MG tablet TAKE 1 TABLET BY MOUTH EVERY DAY Patient taking differently: Take 1 tablet by mouth every morning. 09/06/22  Yes  Einar Pheasant, MD  HYDROcodone-acetaminophen (NORCO/VICODIN) 5-325 MG tablet Take 1 tablet by mouth every 6 (six) hours as needed for moderate pain. Patient not taking: Reported on 11/24/2022 11/04/22   Jules Husbands, MD    Allergies as of 11/17/2022   (No Known Allergies)    Family History  Problem Relation Age of Onset   Cancer Father        germ cell (retroperitoneal)   Coronary artery disease Other        s/p stent (age 53)   Hypertension Paternal Grandfather    Kidney cancer Paternal Grandfather    Diabetes Paternal Grandfather    Diabetes Maternal Grandfather    Heart disease Cousin        MI - age 31    Social History   Socioeconomic History   Marital status: Married    Spouse name: Not on file   Number of children: 2   Years of education: 12   Highest education level: Not on file  Occupational History   Occupation: disabled    Employer: [employer]  Tobacco Use   Smoking status: Never   Smokeless tobacco: Never  Vaping Use   Vaping Use: Never used  Substance and Sexual Activity   Alcohol use: No    Alcohol/week: 0.0 standard drinks of alcohol   Drug use: No   Sexual activity: Not on file  Other Topics Concern   Not on file  Social History Narrative   Lives with girlfriend in a one story home.  Has 2 sons.  Trying to get disability due to several back issues.  Education: high school.    Social Determinants of Health   Financial Resource Strain: Low Risk  (02/24/2022)   Overall Financial Resource Strain (CARDIA)    Difficulty of Paying Living Expenses: Not hard at all  Food Insecurity: No Food Insecurity (02/24/2022)   Hunger Vital Sign    Worried About Running Out of Food in the Last Year: Never true    Ran Out of Food in the Last Year: Never true  Transportation Needs: No Transportation Needs (02/24/2022)   PRAPARE - Hydrologist (Medical): No    Lack of Transportation (Non-Medical): No  Physical Activity: Not  on file  Stress: No Stress Concern Present (02/24/2022)   Advance    Feeling of Stress : Not at all  Social Connections: Unknown (02/24/2022)   Social Connection and Isolation Panel [NHANES]    Frequency of Communication with Friends and Family: Not on file    Frequency of Social Gatherings with Friends and Family: Not on file    Attends Religious Services: Not on file    Active Member of Clubs or Organizations: Not on file    Attends Archivist Meetings: Not on file    Marital Status: Married  Intimate Partner Violence: Not At Risk (  02/24/2022)   Humiliation, Afraid, Rape, and Kick questionnaire    Fear of Current or Ex-Partner: No    Emotionally Abused: No    Physically Abused: No    Sexually Abused: No    Review of Systems: See HPI, otherwise negative ROS  Physical Exam: BP (!) 151/90   Pulse 87   Temp (!) 97.2 F (36.2 C) (Temporal)   Resp 18   Ht '5\' 7"'$  (1.702 m)   Wt 95 kg   SpO2 100%   BMI 32.80 kg/m  General:   Alert,  pleasant and cooperative in NAD Head:  Normocephalic and atraumatic. Neck:  Supple; no masses or thyromegaly. Lungs:  Clear throughout to auscultation, normal respiratory effort.    Heart:  +S1, +S2, Regular rate and rhythm, No edema. Abdomen:  Soft, nontender and nondistended. Normal bowel sounds, without guarding, and without rebound.   Neurologic:  Alert and  oriented x4;  grossly normal neurologically.  Impression/Plan: OSINACHI NAVARRETTE is here for an colonoscopy to be performed for surveillance due to prior history of colon polyps   Risks, benefits, limitations, and alternatives regarding  colonoscopy have been reviewed with the patient.  Questions have been answered.  All parties agreeable.   Cameron Bellows, MD  11/24/2022, 8:50 AM

## 2022-11-24 NOTE — Op Note (Signed)
[facility] Patient Name: Cameron Sanchez Procedure Date: 11/24/2022 8:50 AM MRN: [id number] Account #: [id number] Date of Birth: 1971/09/15 Admit Type: Outpatient Age: 52 Room: [facility] ENDO ROOM 3 Gender: Male Note Status: Finalized Instrument Name: Park Meo [id number] Procedure:             Colonoscopy Indications:           Surveillance: History of piecemeal removal adenoma on                         last colonoscopy (< 3 yrs) Providers:             [name] MD, MD Medicines:             Monitored Anesthesia Care Complications:         No immediate complications. Procedure:             Pre-Anesthesia Assessment:                        - Prior to the procedure, a History and Physical was                         performed, and patient medications, allergies and                         sensitivities were reviewed. The patient's tolerance                         of previous anesthesia was reviewed.                        - The risks and benefits of the procedure and the                         sedation options and risks were discussed with the                         patient. All questions were answered and informed                         consent was obtained.                        - ASA Grade Assessment: II - A patient with mild                         systemic disease.                        After obtaining informed consent, the colonoscope was                         passed under direct vision. Throughout the procedure,                         the patient's blood pressure, pulse, and oxygen                         saturations were monitored continuously. The  Colonoscope was introduced through the anus and                         advanced to the the cecum, identified by the                         appendiceal orifice. The colonoscopy was performed                         with ease. The patient tolerated the procedure well.                          The quality of the bowel preparation was excellent.                         The ileocecal valve, appendiceal orifice, and rectum                         were photographed. Findings:      The perianal and digital rectal examinations were normal.      The entire examined colon appeared normal on direct and retroflexion       views. Impression:            - The entire examined colon is normal on direct and                         retroflexion views.                        - No specimens collected. Recommendation:        - Discharge patient to home (with escort).                        - Resume previous diet.                        - Continue present medications.                        - Repeat colonoscopy in 1 year for surveillance. Procedure Code(s):     --- Professional ---                        [id number], Colonoscopy, flexible; diagnostic, including                         collection of specimen(s) by brushing or washing, when                         performed (separate procedure) Diagnosis Code(s):     --- Professional ---                        Z86.010, Personal history of colonic polyps CPT copyright 2022 American Medical Association. All rights reserved. The codes documented in this report are preliminary and upon coder review may  be revised to meet current compliance requirements. [name], MD [name] MD, MD 11/24/2022 9:16:48 AM This report has been signed electronically. Number of Addenda: 0 Note Initiated On: 11/24/2022 8:50 AM Scope Withdrawal Time: 0 hours 7 minutes 2  seconds  Total Procedure Duration: 0 hours 9 minutes 25 seconds  Estimated Blood Loss:  Estimated blood loss: none.      [facility]

## 2022-11-24 NOTE — Anesthesia Preprocedure Evaluation (Signed)
Anesthesia Evaluation  Patient identified by MRN, date of birth, ID band Patient awake    Reviewed: Allergy & Precautions, NPO status , Patient's Chart, lab work & pertinent test results  History of Anesthesia Complications (+) PROLONGED EMERGENCE and history of anesthetic complications  Airway Mallampati: II  TM Distance: >3 FB Neck ROM: full    Dental  (+) Teeth Intact   Pulmonary neg pulmonary ROS, sleep apnea    Pulmonary exam normal breath sounds clear to auscultation       Cardiovascular Exercise Tolerance: Good hypertension, Pt. on medications negative cardio ROS Normal cardiovascular exam Rhythm:Regular     Neuro/Psych  Headaches TIA negative psych ROS   GI/Hepatic negative GI ROS, Neg liver ROS,GERD  Medicated,,  Endo/Other  negative endocrine ROSdiabetes, Type 1, Insulin Dependent    Renal/GU      Musculoskeletal   Abdominal  (+) + obese  Peds negative pediatric ROS (+)  Hematology negative hematology ROS (+) Blood dyscrasia, anemia   Anesthesia Other Findings Past Medical History: No date: Allergy No date: Anemia No date: Aortic atherosclerosis (HCC) No date: Chronic back pain No date: Chronic back pain No date: Cirrhosis of liver (HCC) No date: Complication of anesthesia     Comment:  hard time to wake up x1 No date: COVID-19 No date: DM (diabetes mellitus), type 2 (HCC) No date: GERD (gastroesophageal reflux disease) No date: Gout No date: Hypercholesterolemia No date: Hypertension No date: Liver lesion No date: Migraines No date: Obesity No date: Sleep apnea 11/2020: Subarachnoid hemorrhage (HCC)     Comment:  no surgery No date: Thrombocytopenia ([place]) No date: TIA (transient ischemic attack)  Past Surgical History: 08/12/2014: BACK SURGERY     Comment:  multiple No date: CHOLECYSTECTOMY 03/11/2022: COLONOSCOPY WITH PROPOFOL; N/A     Comment:  Procedure: COLONOSCOPY WITH PROPOFOL;   Surgeon: [name], MD;  Location: [facility] ENDOSCOPY;  Service:               Gastroenterology;  Laterality: N/A; 03/11/2022: ESOPHAGOGASTRODUODENOSCOPY; N/A     Comment:  Procedure: ESOPHAGOGASTRODUODENOSCOPY (EGD);  Surgeon:               [name], MD;  Location: [facility] ENDOSCOPY;  Service:               Gastroenterology;  Laterality: N/A; No date: KNEE ARTHROSCOPY; Left     Comment:  Dr HBK  BMI    Body Mass Index: 32.55 kg/m      Reproductive/Obstetrics negative OB ROS                             Anesthesia Physical Anesthesia Plan  ASA: 3  Anesthesia Plan: General   Post-op Pain Management: Minimal or no pain anticipated   Induction: Intravenous  PONV Risk Score and Plan: Propofol infusion and TIVA  Airway Management Planned: Natural Airway and Nasal Cannula  Additional Equipment:   Intra-op Plan:   Post-operative Plan:   Informed Consent: I have reviewed the patients History and Physical, chart, labs and discussed the procedure including the risks, benefits and alternatives for the proposed anesthesia with the patient or authorized representative who has indicated his/her understanding and acceptance.     Dental Advisory Given  Plan Discussed with: CRNA and Surgeon  Anesthesia Plan Comments: (Patient consented for risks of anesthesia including but not limited to:  -  adverse reactions to medications - risk of airway placement if required - damage to eyes, teeth, lips or other oral mucosa - nerve damage due to positioning  - sore throat or hoarseness - Damage to heart, brain, nerves, lungs, other parts of body or loss of life  Patient voiced understanding.)       Anesthesia Quick Evaluation

## 2022-11-24 NOTE — Transfer of Care (Signed)
Immediate Anesthesia Transfer of Care Note  Patient: Cameron Sanchez  Procedure(s) Performed: COLONOSCOPY WITH PROPOFOL  Patient Location: PACU  Anesthesia Type:General  Level of Consciousness: awake, alert , oriented, and patient cooperative  Airway & Oxygen Therapy: Patient Spontanous Breathing and Patient connected to face mask oxygen  Post-op Assessment: Report given to RN and Post -op Vital signs reviewed and stable  Post vital signs: Reviewed and stable  Last Vitals:  Vitals Value Taken Time  BP 113/73 11/24/22 0916  Temp 36.2 C 11/24/22 0916  Pulse 89 11/24/22 0916  Resp 15 11/24/22 0916  SpO2      Last Pain:  Vitals:   11/24/22 0916  TempSrc: Temporal  PainSc:          Complications: No notable events documented.

## 2022-11-25 ENCOUNTER — Encounter: Payer: Self-pay | Admitting: Gastroenterology

## 2022-11-26 ENCOUNTER — Encounter: Payer: Self-pay | Admitting: Surgery

## 2022-11-26 ENCOUNTER — Other Ambulatory Visit: Payer: Self-pay | Admitting: Internal Medicine

## 2022-11-26 NOTE — Telephone Encounter (Signed)
Prescription Request  11/26/2022  Is this a "Controlled Substance" medicine? No  LOV: 09/15/2022  What is the name of the medication or equipment? pantoprazole (PROTONIX) 40 MG tablet  Have you contacted your pharmacy to request a refill? Yes   Which pharmacy would you like this sent to?    [id number] [place]  [facility] [address] [id number]: [phone]: [phone]   Patient notified that their request is being sent to the clinical staff for review and that they should receive a response within 2 business days.   Please advise at Mobile [phone](mobile)

## 2022-11-26 NOTE — Progress Notes (Signed)
Outpatient Surgical Follow Up  11/26/2022  Cameron Sanchez is an 52 y.o. male.   Chief Complaint  Patient presents with   Routine Post Op    HPI: Cameron Sanchez is following up after chemical sphincterotomy with blood-streaked.  He has had significant improvement.  No fevers no chills some mild discomfort.  He is very happy  Past Medical History:  Diagnosis Date   Allergy    Anemia    Aortic atherosclerosis (HCC)    Chronic back pain    Chronic back pain    Cirrhosis of liver (HCC)    Complication of anesthesia    hard time to wake up x1   COVID-19    DM (diabetes mellitus), type 2 (HCC)    GERD (gastroesophageal reflux disease)    Gout    Hypercholesterolemia    Hypertension    Liver lesion    Migraines    Obesity    Sleep apnea    Subarachnoid hemorrhage ([place]) 11/2020   no surgery   Thrombocytopenia ([place])    TIA (transient ischemic attack)     Past Surgical History:  Procedure Laterality Date   BACK SURGERY  08/12/2014   multiple   BOTOX INJECTION N/A 11/04/2022   Procedure: BOTOX INJECTION;  Surgeon: Jules Husbands, MD;  Location: [facility] ORS;  Service: General;  Laterality: N/A;   CHOLECYSTECTOMY     COLONOSCOPY WITH PROPOFOL N/A 03/11/2022   Procedure: COLONOSCOPY WITH PROPOFOL;  Surgeon: [name], MD;  Location: [facility] ENDOSCOPY;  Service: Gastroenterology;  Laterality: N/A;   COLONOSCOPY WITH PROPOFOL N/A 11/17/2022   Procedure: COLONOSCOPY WITH PROPOFOL;  Surgeon: [name], MD;  Location: [facility] ENDOSCOPY;  Service: Gastroenterology;  Laterality: N/A;   COLONOSCOPY WITH PROPOFOL N/A 11/24/2022   Procedure: COLONOSCOPY WITH PROPOFOL;  Surgeon: [name], MD;  Location: [facility] ENDOSCOPY;  Service: Gastroenterology;  Laterality: N/A;   ESOPHAGOGASTRODUODENOSCOPY N/A 03/11/2022   Procedure: ESOPHAGOGASTRODUODENOSCOPY (EGD);  Surgeon: [name], MD;  Location: [facility] ENDOSCOPY;  Service: Gastroenterology;  Laterality: N/A;   KNEE ARTHROSCOPY Left    Dr Joellyn Rued    SPHINCTEROTOMY N/A 11/04/2022   Procedure: SPHINCTEROTOMY;  Surgeon: Jules Husbands, MD;  Location: [facility] ORS;  Service: General;  Laterality: N/A;    Family History  Problem Relation Age of Onset   Cancer Father        germ cell (retroperitoneal)   Coronary artery disease Other        s/p stent (age 40)   Hypertension Paternal Grandfather    Kidney cancer Paternal Grandfather    Diabetes Paternal Grandfather    Diabetes Maternal Grandfather    Heart disease Cousin        MI - age 60    Social History:  reports that he has never smoked. He has never used smokeless tobacco. He reports that he does not drink alcohol and does not use drugs.  Allergies: No Known Allergies  Medications reviewed.    ROS Full ROS performed and is otherwise negative other than what is stated in HPI   BP 120/82   Pulse 76   Temp 98 F (36.7 C)   Ht '5\' 7"'$  (1.702 m)   Wt 216 lb (98 kg)   SpO2 97%   BMI 33.83 kg/m   Physical Exam Chaperone present CONSTITUTIONAL: NAD. EYES: Pupils are equal, round, Sclera are non-icteric. EARS, NOSE, MOUTH AND THROAT:  The oral mucosa is pink and moist. Hearing is intact to voice. LYMPH NODES:  Lymph nodes in  the neck are normal. RESPIRATORY:  Lungs are clear. There is normal respiratory effort, with equal breath sounds bilaterally, and without pathologic use of accessory muscles. CARDIOVASCULAR: Heart is regular without murmurs, gallops, or rubs. GI: The abdomen is  soft, nontender, and nondistended. There are no palpable masses. There is no hepatosplenomegaly. There are normal bowel sounds in all quadrants. Rectal: evidence of smal posterior midline fissure w improvement, no masses, some skin tags of little clinical  significance.   MUSCULOSKELETAL: Normal muscle strength and tone. No cyanosis or edema.   SKIN: Turgor is good and there are no pathologic skin lesions or ulcers. NEUROLOGIC: Motor and sensation is grossly normal. Cranial nerves are grossly  intact. PSYCH:  Oriented to person, place and time. Affect is normal.   Assessment/Plan: Doing very well after chemical sphincterotomy.  For now continue current management to include sitz bath's and nifedipine cream.  Will be happy to follow him up in a couple months. Please note that I spent 20 minutes in this encounter including personally reviewing imaging studies, coordinating his care, and performing appropriate documentation    Caroleen Hamman, MD [place] [facility]

## 2022-11-29 NOTE — Telephone Encounter (Signed)
Prescription Request  11/29/2022  Is this a "Controlled Substance" medicine? No  LOV: 09/15/2022  What is the name of the medication or equipment? allopurinol (ZYLOPRIM) 300 MG tablet  Have you contacted your pharmacy to request a refill? Yes   Which pharmacy would you like this sent to?    [id number] [place] - [address] [id number]: [phone]: [phone]   Patient notified that their request is being sent to the clinical staff for review and that they should receive a response within 2 business days.   Please advise at Mobile [phone](mobile)

## 2022-12-03 ENCOUNTER — Other Ambulatory Visit: Payer: Medicare Other | Admitting: Pharmacist

## 2022-12-04 ENCOUNTER — Other Ambulatory Visit: Payer: Self-pay | Admitting: Internal Medicine

## 2022-12-14 ENCOUNTER — Other Ambulatory Visit: Payer: Medicare Other | Admitting: Pharmacist

## 2022-12-14 ENCOUNTER — Inpatient Hospital Stay: Payer: Medicare HMO | Admitting: Licensed Clinical Social Worker

## 2022-12-14 ENCOUNTER — Inpatient Hospital Stay: Payer: Medicare HMO

## 2022-12-16 DIAGNOSIS — G894 Chronic pain syndrome: Secondary | ICD-10-CM | POA: Diagnosis not present

## 2022-12-16 DIAGNOSIS — Z79891 Long term (current) use of opiate analgesic: Secondary | ICD-10-CM | POA: Diagnosis not present

## 2022-12-16 DIAGNOSIS — M545 Low back pain, unspecified: Secondary | ICD-10-CM | POA: Diagnosis not present

## 2022-12-16 DIAGNOSIS — M961 Postlaminectomy syndrome, not elsewhere classified: Secondary | ICD-10-CM | POA: Diagnosis not present

## 2022-12-16 DIAGNOSIS — M5137 Other intervertebral disc degeneration, lumbosacral region: Secondary | ICD-10-CM | POA: Diagnosis not present

## 2022-12-17 ENCOUNTER — Other Ambulatory Visit: Payer: Self-pay

## 2022-12-17 ENCOUNTER — Encounter: Payer: Self-pay | Admitting: Internal Medicine

## 2022-12-17 ENCOUNTER — Ambulatory Visit (INDEPENDENT_AMBULATORY_CARE_PROVIDER_SITE_OTHER): Payer: Medicare HMO | Admitting: Internal Medicine

## 2022-12-17 VITALS — BP 118/70 | HR 89 | Temp 97.9°F | Resp 16 | Ht 67.0 in | Wt 220.4 lb

## 2022-12-17 DIAGNOSIS — K769 Liver disease, unspecified: Secondary | ICD-10-CM | POA: Diagnosis not present

## 2022-12-17 DIAGNOSIS — R0981 Nasal congestion: Secondary | ICD-10-CM

## 2022-12-17 DIAGNOSIS — K219 Gastro-esophageal reflux disease without esophagitis: Secondary | ICD-10-CM

## 2022-12-17 DIAGNOSIS — K7460 Unspecified cirrhosis of liver: Secondary | ICD-10-CM

## 2022-12-17 DIAGNOSIS — K602 Anal fissure, unspecified: Secondary | ICD-10-CM | POA: Diagnosis not present

## 2022-12-17 DIAGNOSIS — E7800 Pure hypercholesterolemia, unspecified: Secondary | ICD-10-CM

## 2022-12-17 DIAGNOSIS — M545 Low back pain, unspecified: Secondary | ICD-10-CM

## 2022-12-17 DIAGNOSIS — F439 Reaction to severe stress, unspecified: Secondary | ICD-10-CM

## 2022-12-17 DIAGNOSIS — D649 Anemia, unspecified: Secondary | ICD-10-CM | POA: Diagnosis not present

## 2022-12-17 DIAGNOSIS — I700 Atherosclerosis of aorta: Secondary | ICD-10-CM

## 2022-12-17 DIAGNOSIS — I10 Essential (primary) hypertension: Secondary | ICD-10-CM | POA: Diagnosis not present

## 2022-12-17 DIAGNOSIS — E1149 Type 2 diabetes mellitus with other diabetic neurological complication: Secondary | ICD-10-CM | POA: Diagnosis not present

## 2022-12-17 DIAGNOSIS — K5900 Constipation, unspecified: Secondary | ICD-10-CM | POA: Diagnosis not present

## 2022-12-17 DIAGNOSIS — G8929 Other chronic pain: Secondary | ICD-10-CM

## 2022-12-17 DIAGNOSIS — D696 Thrombocytopenia, unspecified: Secondary | ICD-10-CM

## 2022-12-17 MED ORDER — CEFDINIR 300 MG PO CAPS: 300.0000 mg | ORAL_CAPSULE | Freq: Two times a day (BID) | ORAL | 0 refills | Status: DC

## 2022-12-17 MED ORDER — CEFDINIR 300 MG PO CAPS
300.0000 mg | ORAL_CAPSULE | Freq: Two times a day (BID) | ORAL | 0 refills | Status: DC
  Filled 2022-12-17: qty 20, 10d supply, fill #0

## 2022-12-17 NOTE — Patient Instructions (Signed)
Examples of probiotics:  culturelle, align or florastor.   Take a probiotic daily while on the antibiotic and for two weeks after completing the antibiotic.    Nasacort nasal spray - 2 sprays each nostril one time per day.  Do this in the evening.    Robitussin.

## 2022-12-17 NOTE — Progress Notes (Unsigned)
Subjective:    [name], male    DOB: May 08, 1971, 52 y.o.   MRN: [id number]  Patient here for  Chief Complaint  Patient presents with   Medical Management of Chronic Issues    HPI Here to follow up regarding his diabetes, hypertension and hypercholesterolemia.  Recently saw Dr Dahlia Byes 11/22/22 - f/u chemical sphincterotomy.  Is staking stool softener and fiber.  Having bowel movements daily or every other day.  Had bowel movement yesterday.  No bleeding.  Overall doing better.  Still with elevated blood sugars.  Unable to download.  Able to review a few readings - 190-250s  (had 391).  Has not taken his insulin the last two days.  States he normally takes.  Has adjusted diet some.  Still drinks a gallon of tea per day.  Discussed the need to decrease amount and increased water intake.  No chest pain reported.  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  Notices increased sweating.  Noticed increased congestion and sore throat.  Ears itch.  Increased drainage.  Dry throat.  Some increased nasal congestion.  No chest congestion or increased cough.  Eating.     Past Medical History:  Diagnosis Date   Allergy    Anemia    Aortic atherosclerosis (HCC)    Chronic back pain    Chronic back pain    Cirrhosis of liver (HCC)    Complication of anesthesia    hard time to wake up x1   COVID-19    DM (diabetes mellitus), type 2 (HCC)    GERD (gastroesophageal reflux disease)    Gout    Hypercholesterolemia    Hypertension    Liver lesion    Migraines    Obesity    Sleep apnea    Subarachnoid hemorrhage ([place]) 11/2020   no surgery   Thrombocytopenia ([place])    TIA (transient ischemic attack)    Past Surgical History:  Procedure Laterality Date   BACK SURGERY  08/12/2014   multiple   BOTOX INJECTION N/A 11/04/2022   Procedure: BOTOX INJECTION;  Surgeon: Jules Husbands, MD;  Location: [facility] ORS;  Service: General;  Laterality: N/A;   CHOLECYSTECTOMY     COLONOSCOPY WITH PROPOFOL N/A  03/11/2022   Procedure: COLONOSCOPY WITH PROPOFOL;  Surgeon: [name], MD;  Location: [facility] ENDOSCOPY;  Service: Gastroenterology;  Laterality: N/A;   COLONOSCOPY WITH PROPOFOL N/A 11/17/2022   Procedure: COLONOSCOPY WITH PROPOFOL;  Surgeon: [name], MD;  Location: [facility] ENDOSCOPY;  Service: Gastroenterology;  Laterality: N/A;   COLONOSCOPY WITH PROPOFOL N/A 11/24/2022   Procedure: COLONOSCOPY WITH PROPOFOL;  Surgeon: [name], MD;  Location: [facility] ENDOSCOPY;  Service: Gastroenterology;  Laterality: N/A;   ESOPHAGOGASTRODUODENOSCOPY N/A 03/11/2022   Procedure: ESOPHAGOGASTRODUODENOSCOPY (EGD);  Surgeon: [name], MD;  Location: [facility] ENDOSCOPY;  Service: Gastroenterology;  Laterality: N/A;   KNEE ARTHROSCOPY Left    Dr Joellyn Rued   SPHINCTEROTOMY N/A 11/04/2022   Procedure: SPHINCTEROTOMY;  Surgeon: Jules Husbands, MD;  Location: [facility] ORS;  Service: General;  Laterality: N/A;   Family History  Problem Relation Age of Onset   Cancer Father        germ cell (retroperitoneal)   Coronary artery disease Other        s/p stent (age 37)   Hypertension Paternal Grandfather    Kidney cancer Paternal Grandfather    Diabetes Paternal Grandfather    Diabetes Maternal Grandfather    Heart disease Cousin  MI - age 34   Social History   Socioeconomic History   Marital status: Married    Spouse name: Not on file   Number of children: 2   Years of education: 12   Highest education level: Not on file  Occupational History   Occupation: disabled    Employer: [employer]  Tobacco Use   Smoking status: Never   Smokeless tobacco: Never  Vaping Use   Vaping Use: Never used  Substance and Sexual Activity   Alcohol use: No    Alcohol/week: 0.0 standard drinks of alcohol   Drug use: No   Sexual activity: Not on file  Other Topics Concern   Not on file  Social History Narrative   Lives with girlfriend in a one story home.  Has 2 sons.  Trying to get disability due to several  back issues.  Education: high school.    Social Determinants of Health   Financial Resource Strain: Low Risk  (02/24/2022)   Overall Financial Resource Strain (CARDIA)    Difficulty of Paying Living Expenses: Not hard at all  Food Insecurity: No Food Insecurity (02/24/2022)   Hunger Vital Sign    Worried About Running Out of Food in the Last Year: Never true    Ran Out of Food in the Last Year: Never true  Transportation Needs: No Transportation Needs (02/24/2022)   PRAPARE - Hydrologist (Medical): No    Lack of Transportation (Non-Medical): No  Physical Activity: Not on file  Stress: No Stress Concern Present (02/24/2022)   [place]    Feeling of Stress : Not at all  Social Connections: Unknown (02/24/2022)   Social Connection and Isolation Panel [NHANES]    Frequency of Communication with Friends and Family: Not on file    Frequency of Social Gatherings with Friends and Family: Not on file    Attends Religious Services: Not on file    Active Member of Clubs or Organizations: Not on file    Attends Archivist Meetings: Not on file    Marital Status: Married     Review of Systems  Constitutional:  Negative for appetite change, fever and unexpected weight change.  HENT:  Positive for congestion and postnasal drip.        Throat irritation.   Respiratory:  Negative for chest tightness and shortness of breath.   Cardiovascular:  Negative for chest pain, palpitations and leg swelling.  Gastrointestinal:  Negative for abdominal pain, diarrhea, nausea and vomiting.  Genitourinary:  Negative for difficulty urinating and dysuria.  Musculoskeletal:  Positive for back pain. Negative for myalgias.  Skin:  Negative for color change and rash.  Neurological:  Negative for dizziness and headaches.  Psychiatric/Behavioral:  Negative for agitation and dysphoric mood.        Objective:      BP 118/70   Pulse 89   Temp 97.9 F (36.6 C)   Resp 16   Ht '5\' 7"'$  (1.702 m)   Wt 220 lb 6.4 oz (100 kg)   SpO2 97%   BMI 34.52 kg/m  Wt Readings from Last 3 Encounters:  12/17/22 220 lb 6.4 oz (100 kg)  11/24/22 209 lb 7 oz (95 kg)  11/22/22 216 lb (98 kg)    Physical Exam Vitals reviewed.  Constitutional:      General: He is not in acute distress.    Appearance: Normal appearance. He is well-developed.  HENT:     Head: Normocephalic and atraumatic.     Right Ear: External ear normal.     Left Ear: External ear normal.  Eyes:     General: No scleral icterus.       Right eye: No discharge.        Left eye: No discharge.     Conjunctiva/sclera: Conjunctivae normal.  Cardiovascular:     Rate and Rhythm: Normal rate and regular rhythm.  Pulmonary:     Effort: Pulmonary effort is normal. No respiratory distress.     Breath sounds: Normal breath sounds.  Abdominal:     General: Bowel sounds are normal.     Palpations: Abdomen is soft.     Tenderness: There is no abdominal tenderness.  Musculoskeletal:        General: No swelling or tenderness.     Cervical back: Neck supple. No tenderness.  Lymphadenopathy:     Cervical: No cervical adenopathy.  Skin:    Findings: No erythema or rash.  Neurological:     Mental Status: He is alert.  Psychiatric:        Mood and Affect: Mood normal.        Behavior: Behavior normal.      Outpatient Encounter Medications as of 12/17/2022  Medication Sig   [DISCONTINUED] cefdinir (OMNICEF) 300 MG capsule Take 1 capsule (300 mg total) by mouth 2 (two) times daily.   allopurinol (ZYLOPRIM) 300 MG tablet TAKE 1 TABLET BY MOUTH EVERY DAY   atorvastatin (LIPITOR) 40 MG tablet Take 1 tablet (40 mg total) by mouth daily. (Patient taking differently: Take 40 mg by mouth every morning.)   cefdinir (OMNICEF) 300 MG capsule Take 1 capsule (300 mg total) by mouth 2 (two) times daily.   Continuous Blood Gluc Sensor (FREESTYLE LIBRE 3 SENSOR)  MISC Place 1 sensor on the skin every 14 days. Use to check glucose continuously   ferrous sulfate (FERROUSUL) 325 (65 FE) MG tablet Take 1 tablet (325 mg total) by mouth every other day.   insulin degludec (TRESIBA FLEXTOUCH) 200 UNIT/ML FlexTouch Pen Inject 24 Units into the skin every morning.   Insulin Pen Needle (RELION PEN NEEDLES) 31G X 6 MM MISC Use as directed   metaxalone (SKELAXIN) 800 MG tablet Take 800 mg by mouth 3 (three) times daily.   metFORMIN (GLUCOPHAGE) 500 MG tablet Take 2 tablets (1,000 mg total) by mouth 2 (two) times daily with a meal.   metoprolol succinate (TOPROL-XL) 50 MG 24 hr tablet TAKE 2 TABLETS BY MOUTH DAILY WITH OR IMMEDIATELY FOLLOWING A MEAL.   oxyCODONE (ROXICODONE) 15 MG immediate release tablet Take 15 mg by mouth every 4 (four) hours.   pantoprazole (PROTONIX) 40 MG tablet TAKE 1 TABLET (40 MG TOTAL) BY MOUTH TWICE A DAY BEFORE MEALS   triamterene-hydrochlorothiazide (MAXZIDE-25) 37.5-25 MG tablet TAKE 1 TABLET BY MOUTH EVERY DAY   [DISCONTINUED] HYDROcodone-acetaminophen (NORCO/VICODIN) 5-325 MG tablet Take 1 tablet by mouth every 6 (six) hours as needed for moderate pain. (Patient not taking: Reported on 11/24/2022)   No facility-administered encounter medications on file as of 12/17/2022.     Lab Results  Component Value Date   WBC 7.6 09/08/2022   HGB 12.6 (L) 09/08/2022   HCT 39.1 09/08/2022   PLT 120.0 (L) 09/08/2022   GLUCOSE 176 (H) 10/15/2022   CHOL 135 09/08/2022   TRIG 333.0 (H) 09/08/2022   HDL 34.10 (L) 09/08/2022   LDLDIRECT 53.0 09/08/2022   LDLCALC 43 06/10/2022  ALT 18 09/08/2022   AST 19 09/08/2022   NA 137 10/15/2022   K 4.1 10/15/2022   CL 105 10/15/2022   CREATININE 0.78 10/15/2022   BUN 10 10/15/2022   CO2 24 10/15/2022   TSH 3.18 09/08/2022   PSA 0.43 01/04/2018   INR 1.0 09/08/2022   HGBA1C 14.9 (H) 09/08/2022   MICROALBUR 0.9 06/10/2022    No results found.     Assessment & Plan:  Nasal  congestion Assessment & Plan: Increased congestion and sore throat as outlined.  Worsened over the last couple of days.  Increased sinus pressure.  Saline nasal spray/nasacort nasal spray and robitussin as directed.  Omnicef.  Nasal swab for covid/flu/RSV.  Discussed quarantine guidelines.  Follow.  Call with update.   Orders: -     COVID-19, Flu A+B and RSV  Hypercholesterolemia Assessment & Plan: Was on lipitor. Low cholesterol diet and exercise.  Follow lipid panel and liver function tests.  Await MRI results regarding liver.   Orders: -     Lipid panel; Future -     Hepatic function panel; Future  Type 2 diabetes mellitus with neurological complications (HCC) Assessment & Plan: Sugars elevated as outlined.  Discussed diet and exercise.  Has not taking insulin the last two days. Discussed need for compliance with medication. Does not check blood sugars.  On tresiba.  Now on 24 units per day.  Hold on increasing.  Discussed the need to take insulin daily.  Review sugar readings over the next couple of weeks.  Check met b and A1c.  Discussed referral to endocrinology.    Orders: -     Hemoglobin A1c; Future -     Ambulatory referral to Endocrinology  Anemia, unspecified type Assessment & Plan: Recent hgb decreased 12.6.  had colonoscopy 02/2022.  Multiple polyps.  Recommended f/u in 6-8 months.  (Dr Vicente Males).  Follow cbc.   Orders: -     Basic metabolic panel; Future -     CBC with Differential/Platelet; Future  Anal fissure Assessment & Plan: Dr Dahlia Byes - 10/2022 - Chemical Lateral internal Sphincterotomy using 50 IU Botox.  Appears to be doing better.    Aortic atherosclerosis (HCC) Assessment & Plan: Lipitor.     Chronic low back pain, unspecified back pain laterality, unspecified whether sciatica present Assessment & Plan: Followed by pain clinic.  On oxycodone.  Discussed the need to keep bowel moving.  Discussed bowel regimen.     Cirrhosis of liver without ascites,  unspecified hepatic cirrhosis type [facility]) Assessment & Plan: MRI as outlined.  Recent liver panel AST 38.  Remainder of liver panel wnl.  Discussed fatty liver.  Discussed the need to get sugars under better control.  Does not drink alcohol.  Saw GI for further w/up and evaluation - cirrhosis and liver lesion.  Planning for f/u MRI next week.    Constipation, unspecified constipation type Assessment & Plan: Takes narcotic pain medication. Have discussed bowel regimen - to keep bowels moving.  Saw GI.  Colonoscopy 03/11/22 - Four 2 to 3 mm polyps in the cecum, removed with a cold biopsy forceps. Resected and retrieved. Two 4 to 6 mm polyps in the cecum, removed with a cold snare. Resected and retrieved. Three 5 to 7 mm polyps in the ascending colon, removed with a cold snare. Resected and retrieved. One 15 mm polyp in the ascending colon, removed with mucosal resection. Resected and retrieved. Clip was placed. The examination was otherwise normal on direct and  retroflexion views. Mucosal resection was performed. Resection and retrieval were complete. Continue f/u with GI.  Recommended f/u colonoscopy 6-8 months. Overall appears to be doing some better as outlined.    Gastroesophageal reflux disease, unspecified whether esophagitis present Assessment & Plan: On protonix.  Follow.    Primary hypertension Assessment & Plan: Blood pressure doing well.  Continue triam/hctz and metoprolol.  Follow pressures.  Follow metabolic panel.    Liver lesion Assessment & Plan: Found on recent MRI.  Saw GI.  Planning f/u MRI next week.    Stress Assessment & Plan: Overall appears to be handling things relatively well.  Follow.    Thrombocytopenia (HCC) Assessment & Plan: Low platelet count - feel related to his underlying liver issues.  Follow cbc.     Other orders -     Cefdinir; Take 1 capsule (300 mg total) by mouth 2 (two) times daily.  Dispense: 20 capsule; Refill: 0     Einar Pheasant,  MD

## 2022-12-18 LAB — COVID-19, FLU A+B AND RSV
Influenza A, NAA: NOT DETECTED
Influenza B, NAA: NOT DETECTED
RSV, NAA: NOT DETECTED
SARS-CoV-2, NAA: NOT DETECTED

## 2022-12-19 ENCOUNTER — Encounter: Payer: Self-pay | Admitting: Internal Medicine

## 2022-12-19 DIAGNOSIS — R0981 Nasal congestion: Secondary | ICD-10-CM | POA: Insufficient documentation

## 2022-12-19 NOTE — Assessment & Plan Note (Signed)
Overall appears to be handling things relatively well.  Follow.   

## 2022-12-19 NOTE — Assessment & Plan Note (Signed)
Sugars elevated as outlined.  Discussed diet and exercise.  Has not taking insulin the last two days. Discussed need for compliance with medication. Does not check blood sugars.  On tresiba.  Now on 24 units per day.  Hold on increasing.  Discussed the need to take insulin daily.  Review sugar readings over the next couple of weeks.  Check met b and A1c.  Discussed referral to endocrinology.

## 2022-12-19 NOTE — Assessment & Plan Note (Addendum)
Low platelet count - feel related to his underlying liver issues.  Follow cbc.

## 2022-12-19 NOTE — Assessment & Plan Note (Signed)
Takes narcotic pain medication. Have discussed bowel regimen - to keep bowels moving.  Saw GI.  Colonoscopy 03/11/22 - Four 2 to 3 mm polyps in the cecum, removed with a cold biopsy forceps. Resected and retrieved. Two 4 to 6 mm polyps in the cecum, removed with a cold snare. Resected and retrieved. Three 5 to 7 mm polyps in the ascending colon, removed with a cold snare. Resected and retrieved. One 15 mm polyp in the ascending colon, removed with mucosal resection. Resected and retrieved. Clip was placed. The examination was otherwise normal on direct and retroflexion views. Mucosal resection was performed. Resection and retrieval were complete. Continue f/u with GI.  Recommended f/u colonoscopy 6-8 months. Overall appears to be doing some better as outlined.

## 2022-12-19 NOTE — Assessment & Plan Note (Signed)
Followed by pain clinic.  On oxycodone.  Discussed the need to keep bowel moving.  Discussed bowel regimen.

## 2022-12-19 NOTE — Assessment & Plan Note (Signed)
Lipitor 

## 2022-12-19 NOTE — Assessment & Plan Note (Signed)
On protonix.  Follow.

## 2022-12-19 NOTE — Assessment & Plan Note (Signed)
Found on recent MRI.  Saw GI.  Planning f/u MRI next week.

## 2022-12-19 NOTE — Assessment & Plan Note (Signed)
Was on lipitor. Low cholesterol diet and exercise.  Follow lipid panel and liver function tests.  Await MRI results regarding liver.

## 2022-12-19 NOTE — Assessment & Plan Note (Signed)
Dr Dahlia Byes - 10/2022 - Chemical Lateral internal Sphincterotomy using 50 IU Botox.  Appears to be doing better.

## 2022-12-19 NOTE — Assessment & Plan Note (Addendum)
Increased congestion and sore throat as outlined.  Worsened over the last couple of days.  Increased sinus pressure.  Saline nasal spray/nasacort nasal spray and robitussin as directed.  Omnicef.  Nasal swab for covid/flu/RSV.  Discussed quarantine guidelines.  Follow.  Call with update.

## 2022-12-19 NOTE — Assessment & Plan Note (Signed)
Recent hgb decreased 12.6.  had colonoscopy 02/2022.  Multiple polyps.  Recommended f/u in 6-8 months.  (Dr Vicente Males).  Follow cbc.

## 2022-12-19 NOTE — Assessment & Plan Note (Signed)
Blood pressure doing well.  Continue triam/hctz and metoprolol.  Follow pressures.  Follow metabolic panel.

## 2022-12-19 NOTE — Assessment & Plan Note (Signed)
MRI as outlined.  Recent liver panel AST 38.  Remainder of liver panel wnl.  Discussed fatty liver.  Discussed the need to get sugars under better control.  Does not drink alcohol.  Saw GI for further w/up and evaluation - cirrhosis and liver lesion.  Planning for f/u MRI next week.

## 2022-12-20 ENCOUNTER — Ambulatory Visit
Admission: RE | Admit: 2022-12-20 | Discharge: 2022-12-20 | Disposition: A | Discharge status: Home / Self Care | Payer: Medicare HMO | Source: Ambulatory Visit | Attending: Gastroenterology | Admitting: Gastroenterology

## 2022-12-20 ENCOUNTER — Other Ambulatory Visit: Payer: Self-pay

## 2022-12-20 DIAGNOSIS — R932 Abnormal findings on diagnostic imaging of liver and biliary tract: Secondary | ICD-10-CM | POA: Diagnosis not present

## 2022-12-20 DIAGNOSIS — R161 Splenomegaly, not elsewhere classified: Secondary | ICD-10-CM | POA: Diagnosis not present

## 2022-12-20 DIAGNOSIS — K7689 Other specified diseases of liver: Secondary | ICD-10-CM | POA: Diagnosis not present

## 2022-12-20 DIAGNOSIS — K7469 Other cirrhosis of liver: Secondary | ICD-10-CM | POA: Insufficient documentation

## 2022-12-20 DIAGNOSIS — Z9049 Acquired absence of other specified parts of digestive tract: Secondary | ICD-10-CM | POA: Diagnosis not present

## 2022-12-20 MED ORDER — GADOBUTROL 1 MMOL/ML IV SOLN
10.0000 mL | Freq: Once | INTRAVENOUS | Status: AC | PRN
  Administered 2022-12-20: 10 mL via INTRAVENOUS

## 2022-12-21 NOTE — Progress Notes (Signed)
No lesions of concern seen - suggest repeat RUQ USG in 6 months

## 2022-12-22 ENCOUNTER — Encounter: Payer: Medicare HMO | Admitting: Surgery

## 2022-12-22 ENCOUNTER — Telehealth: Payer: Self-pay

## 2022-12-22 DIAGNOSIS — K769 Liver disease, unspecified: Secondary | ICD-10-CM

## 2022-12-22 NOTE — Telephone Encounter (Signed)
-----   Message from [name], MD sent at 12/21/2022 12:36 PM EST ----- No lesions of concern seen - suggest repeat RUQ USG in 6 months

## 2022-12-22 NOTE — Telephone Encounter (Signed)
Called patient back to let him know the below information. He stated that he was glad that there was no lesions of concern seen. Patient agrees on getting an ultrasound in 6 months.

## 2022-12-31 ENCOUNTER — Telehealth: Payer: Self-pay

## 2022-12-31 ENCOUNTER — Other Ambulatory Visit (INDEPENDENT_AMBULATORY_CARE_PROVIDER_SITE_OTHER): Payer: Medicare HMO

## 2022-12-31 ENCOUNTER — Other Ambulatory Visit: Payer: Self-pay

## 2022-12-31 DIAGNOSIS — D649 Anemia, unspecified: Secondary | ICD-10-CM | POA: Diagnosis not present

## 2022-12-31 DIAGNOSIS — E1149 Type 2 diabetes mellitus with other diabetic neurological complication: Secondary | ICD-10-CM

## 2022-12-31 DIAGNOSIS — E7800 Pure hypercholesterolemia, unspecified: Secondary | ICD-10-CM

## 2022-12-31 LAB — BASIC METABOLIC PANEL
BUN: 13 mg/dL (ref 6–23)
CO2: 24 mEq/L (ref 19–32)
Calcium: 9.4 mg/dL (ref 8.4–10.5)
Chloride: 101 mEq/L (ref 96–112)
Creatinine, Ser: 0.94 mg/dL (ref 0.40–1.50)
GFR: 92.46 mL/min (ref >60.00)
Glucose, Bld: 211 mg/dL — ABNORMAL HIGH (ref 70–99)
Potassium: 3.9 mEq/L (ref 3.5–5.1)
Sodium: 134 mEq/L — ABNORMAL LOW (ref 135–145)

## 2022-12-31 LAB — CBC WITH DIFFERENTIAL/PLATELET
Basophils Absolute: 0 10*3/uL (ref 0.0–0.1)
Basophils Relative: 0.6 % (ref 0.0–3.0)
Eosinophils Absolute: 0.1 10*3/uL (ref 0.0–0.7)
Eosinophils Relative: 1.7 % (ref 0.0–5.0)
HCT: 38.4 % — ABNORMAL LOW (ref 39.0–52.0)
Hemoglobin: 12.7 g/dL — ABNORMAL LOW (ref 13.0–17.0)
Lymphocytes Relative: 35.6 % (ref 12.0–46.0)
Lymphs Abs: 2.7 10*3/uL (ref 0.7–4.0)
MCHC: 33 g/dL (ref 30.0–36.0)
MCV: 78.4 fl (ref 78.0–100.0)
Monocytes Absolute: 0.8 10*3/uL (ref 0.1–1.0)
Monocytes Relative: 10.4 % (ref 3.0–12.0)
Neutro Abs: 3.9 10*3/uL (ref 1.4–7.7)
Neutrophils Relative %: 51.7 % (ref 43.0–77.0)
Platelets: 135 10*3/uL — ABNORMAL LOW (ref 150.0–400.0)
RBC: 4.89 Mil/uL (ref 4.22–5.81)
RDW: 17.8 % — ABNORMAL HIGH (ref 11.5–15.5)
WBC: 7.5 10*3/uL (ref 4.0–10.5)

## 2022-12-31 LAB — HEMOGLOBIN A1C: Hgb A1c MFr Bld: 9.1 % — ABNORMAL HIGH (ref 4.6–6.5)

## 2022-12-31 LAB — LIPID PANEL
Cholesterol: 130 mg/dL (ref 0–200)
HDL: 48 mg/dL (ref >39.00)
LDL Cholesterol: 53 mg/dL (ref 0–99)
NonHDL: 81.53
Total CHOL/HDL Ratio: 3
Triglycerides: 144 mg/dL (ref 0.0–149.0)
VLDL: 28.8 mg/dL (ref 0.0–40.0)

## 2022-12-31 LAB — HEPATIC FUNCTION PANEL
ALT: 23 U/L (ref 0–53)
AST: 24 U/L (ref 0–37)
Albumin: 4.3 g/dL (ref 3.5–5.2)
Alkaline Phosphatase: 69 U/L (ref 39–117)
Bilirubin, Direct: 0.1 mg/dL (ref 0.0–0.3)
Total Bilirubin: 0.6 mg/dL (ref 0.2–1.2)
Total Protein: 7.1 g/dL (ref 6.0–8.3)

## 2022-12-31 MED ORDER — FREESTYLE LIBRE 3 SENSOR MISC: 3 refills | Status: DC

## 2022-12-31 NOTE — Telephone Encounter (Signed)
Prescription Request  12/31/2022  Is this a "Controlled Substance" medicine? No  LOV: Visit date not found  What is the name of the medication or equipment? Continuous Blood Gluc Sensor (FREESTYLE LIBRE 3 SENSOR) MISC  Have you contacted your pharmacy to request a refill? No   Which pharmacy would you like this sent to?   [id number] [place] [facility] [address] [place]: [phone]: [phone]   Patient notified that their request is being sent to the clinical staff for review and that they should receive a response within 2 business days.   Please advise at Mobile [phone] (mobile)   Patient states he has four days worth left.

## 2022-12-31 NOTE — Telephone Encounter (Signed)
Refill sent in

## 2023-01-06 ENCOUNTER — Inpatient Hospital Stay: Payer: Medicare HMO

## 2023-01-06 ENCOUNTER — Inpatient Hospital Stay: Payer: Medicare HMO | Attending: Oncology | Admitting: Licensed Clinical Social Worker

## 2023-01-11 ENCOUNTER — Other Ambulatory Visit: Payer: Medicare HMO | Admitting: Pharmacist

## 2023-01-11 DIAGNOSIS — I700 Atherosclerosis of aorta: Secondary | ICD-10-CM

## 2023-01-11 DIAGNOSIS — E1149 Type 2 diabetes mellitus with other diabetic neurological complication: Secondary | ICD-10-CM

## 2023-01-11 MED ORDER — ATORVASTATIN CALCIUM 40 MG PO TABS: 40.0000 mg | ORAL_TABLET | Freq: Every day | ORAL | 1 refills | Status: DC

## 2023-01-11 MED ORDER — OZEMPIC (0.25 OR 0.5 MG/DOSE) 2 MG/3ML ~~LOC~~ SOPN: PEN_INJECTOR | SUBCUTANEOUS | 2 refills | Status: DC

## 2023-01-11 MED ORDER — TRESIBA FLEXTOUCH 200 UNIT/ML ~~LOC~~ SOPN: 20.0000 [IU] | PEN_INJECTOR | Freq: Every day | SUBCUTANEOUS | 2 refills | Status: DC

## 2023-01-11 MED ORDER — METFORMIN HCL 500 MG PO TABS: 500.0000 mg | ORAL_TABLET | Freq: Two times a day (BID) | ORAL | 3 refills | Status: DC

## 2023-01-11 NOTE — Patient Instructions (Signed)
Legrand Como,   Congratulations on the improvement in your A1c!  Start Ozempic 0.25 mg weekly for 4 weeks, then increase to 0.5 mg weekly. This medication may cause stomach upset, queasiness, or constipation, especially when first starting. This generally improves over time. Call our office if these symptoms occur and worsen, or if you have severe symptoms such as vomiting, diarrhea, or stomach pain.   Decrease Tresiba to 20 units daily. Continue metformin 500 mg twice daily.   We will apply for assistance from the [place] and [employer].   Please call me with any questions.   [place], PharmD [phone]

## 2023-01-11 NOTE — Progress Notes (Signed)
01/11/2023 Name: Cameron Sanchez MRN: [id number] DOB: 08/17/1971  Chief Complaint  Patient presents with   Medication Management   Diabetes   Hyperlipidemia    Cameron Sanchez is a 52 y.o. year old male who was referred for medication management by their primary care provider, Einar Pheasant, MD. They presented for a face to face visit today.   They were referred to the pharmacist by their PCP for assistance in managing diabetes and medication access    Subjective:  Care Team: Primary Care Provider: Einar Pheasant, MD ; Next Scheduled Visit: 03/17/23  Medication Access/Adherence  Current Pharmacy:  [facility] [place] [address] Phone: [phone] Fax: [phone]  [place], [place] - [place] [address] Phone: [phone] Fax: [phone]  [id number] [place] [place] [address] [place]: [phone]: [phone]  Patient reports affordability concerns with their medications: Yes  Patient reports access/transportation concerns to their pharmacy: No  Patient reports adherence concerns with their medications:  No     Diabetes:  Current medications: metformin 1000 mg daily - has not been taking twice daily; Tresiba 24 units daily   Date of Download: 2/14/-01/11/23 % Time CGM is active: 97% Average Glucose: 224 mg/dL Glucose Management Indicator: 8.7  Glucose Variability: 23.3 (goal <36%) Time in Goal:  - Time in range 70-180: 19% - Time above range: 81% - Time below range: 0%   Patient denies hypoglycemic s/sx including dizziness, shakiness, sweating.   Current meal patterns:  - Breakfast: bacon 4 pieces; toast - 3 pieces; occasionally raisin bran and fiber one - Lunch: usually not a lot of lunch; - Supper: brown rice; steamed broccoli; air fryer chicken with cheese;  - Snacks: wife has lots  of sugar cravings; has gotten some zero sugar ice cream - Drinks: cutting   Hypertension:  Current medications: metoprolol succinate 100 mg daily, triamterene/HCTZ 37.5/25 mg daily   Hyperlipidemia/ASCVD Risk Reduction  Current lipid lowering medications: atorvastatin 40 mg daily  Patient notes this medication was previously expensive, but has not tried to refill yet  Objective:  Lab Results  Component Value Date   HGBA1C 9.1 (H) 12/31/2022    Lab Results  Component Value Date   CREATININE 0.94 12/31/2022   BUN 13 12/31/2022   NA 134 (L) 12/31/2022   K 3.9 12/31/2022   CL 101 12/31/2022   CO2 24 12/31/2022    Lab Results  Component Value Date   CHOL 130 12/31/2022   HDL 48.00 12/31/2022   LDLCALC 53 12/31/2022   LDLDIRECT 53.0 09/08/2022   TRIG 144.0 12/31/2022   CHOLHDL 3 12/31/2022    Medications Reviewed Today     Reviewed by SEinar Pheasant MD (Physician) on 12/19/22 at [facility] Status: <None>   Medication Order Taking? Sig Documenting Provider Last Dose Status Informant  allopurinol (ZYLOPRIM) 300 MG tablet [id number] TAKE 1 TABLET BY MOUTH EVERY DAY SEinar Pheasant MD  Active   atorvastatin (LIPITOR) 40 MG tablet [id number] Take 1 tablet (40 mg total) by mouth daily.  Patient taking differently: Take 40 mg by mouth every morning.   SEinar Pheasant MD Past Week Active   cefdinir (OMNICEF) 300 MG capsule [id number] Take 1 capsule (300 mg total) by mouth 2 (two) times daily. SEinar Pheasant MD  Active   Continuous Blood Gluc Sensor (  FREESTYLE LIBRE 3 SENSOR) [id number] [id number] No Place 1 sensor on the skin every 14 days. Use to check glucose continuously Einar Pheasant, MD 11/24/2022 Active   ferrous sulfate (FERROUSUL) 325 (65 FE) MG tablet [id number] No Take 1 tablet (325 mg total) by mouth every other day. [name], MD Past Week Active   insulin degludec (TRESIBA FLEXTOUCH) 200 UNIT/ML FlexTouch Pen [id number] No Inject 24 Units into the skin every  morning. Einar Pheasant, MD 11/23/2022 Active   Insulin Pen Needle (RELION PEN NEEDLES) 31G X 6 MM MISC [id number] No Use as directed Einar Pheasant, MD 11/23/2022 Active   metaxalone (SKELAXIN) 800 MG tablet [id number] No Take 800 mg by mouth 3 (three) times daily. [provider] 11/23/2022 Active   metFORMIN (GLUCOPHAGE) 500 MG tablet [id number] No Take 2 tablets (1,000 mg total) by mouth 2 (two) times daily with a meal. Einar Pheasant, MD Past Week Active   metoprolol succinate (TOPROL-XL) 50 MG 24 hr tablet [id number]  TAKE 2 TABLETS BY MOUTH DAILY WITH OR IMMEDIATELY FOLLOWING A MEAL. Einar Pheasant, MD  Active   oxyCODONE (ROXICODONE) 15 MG immediate release tablet [id number] No Take 15 mg by mouth every 4 (four) hours. [provider] 11/24/2022 0400 Active   pantoprazole (PROTONIX) 40 MG tablet [id number] No TAKE 1 TABLET (40 MG TOTAL) BY MOUTH TWICE A DAY BEFORE MEALS Einar Pheasant, MD 11/23/2022 Active   triamterene-hydrochlorothiazide (MAXZIDE-25) 37.5-25 MG tablet [id number]  TAKE 1 TABLET BY MOUTH EVERY DAY Einar Pheasant, MD  Active               Assessment/Plan:   Diabetes: - Currently uncontrolled - Reviewed long term cardiovascular and renal outcomes of uncontrolled blood sugar - Reviewed goal A1c, goal fasting, and goal 2 hour post prandial glucose - Reviewed dietary modifications including: focus on lean proteins, vegetables, whole grains and fibers; focus on water.  - Recommend to either increase metformin or start GLP1. No history of pancreatitis, thyroid cancer. Recent liver ultrasound indicates steatosis, which GLP1 has benefit in. Discussed mechanism of action, side effects, including constipation. Discussed focus on hydration, fibers, stool softener/fiber supplement as needed. Patient elects to start GLP1. Start Ozempic 0.25 mg weekly for 4 weeks, then increase to 0.5 mg weekly. Decrease Tresiba to 20 units daily. Continue metformin at a total of 1000 mg  daily - Recommend to check glucose continuously using CGM - Meets financial criteria for Ozempic and [place] patient assistance program through [employer]. Will collaborate with provider, CPhT, and patient to pursue assistance.   Hyperlipidemia/ASCVD Risk Reduction: - Currently controlled.  - Recommend to continue atorvastatin. Script refilled.   Follow Up Plan: phone call in 4-6 weeks  [name] TJodi Mourning, PharmD, [place], [facility] [phone]

## 2023-01-13 DIAGNOSIS — M545 Low back pain, unspecified: Secondary | ICD-10-CM | POA: Diagnosis not present

## 2023-01-13 DIAGNOSIS — M5137 Other intervertebral disc degeneration, lumbosacral region: Secondary | ICD-10-CM | POA: Diagnosis not present

## 2023-01-13 DIAGNOSIS — Z79891 Long term (current) use of opiate analgesic: Secondary | ICD-10-CM | POA: Diagnosis not present

## 2023-01-13 DIAGNOSIS — G894 Chronic pain syndrome: Secondary | ICD-10-CM | POA: Diagnosis not present

## 2023-01-13 DIAGNOSIS — M961 Postlaminectomy syndrome, not elsewhere classified: Secondary | ICD-10-CM | POA: Diagnosis not present

## 2023-01-19 ENCOUNTER — Encounter: Payer: Self-pay | Admitting: Nurse Practitioner

## 2023-01-20 ENCOUNTER — Telehealth: Payer: Self-pay

## 2023-01-20 ENCOUNTER — Other Ambulatory Visit (HOSPITAL_COMMUNITY): Payer: Self-pay

## 2023-01-20 NOTE — Telephone Encounter (Signed)
I have received PAP application for Ozempic (novo nordisk). I called pt and he will be bringing in income verification to office. Once he bring it to office and it is faxed to me at [phone] I will send to company for processing  Thank you,  [facility] [phone]

## 2023-01-31 ENCOUNTER — Other Ambulatory Visit: Payer: Self-pay

## 2023-01-31 ENCOUNTER — Telehealth: Payer: Self-pay | Admitting: Internal Medicine

## 2023-01-31 DIAGNOSIS — E1149 Type 2 diabetes mellitus with other diabetic neurological complication: Secondary | ICD-10-CM

## 2023-01-31 MED ORDER — FREESTYLE LIBRE 3 SENSOR MISC: 3 refills | Status: DC

## 2023-01-31 NOTE — Telephone Encounter (Signed)
Prescription Request  01/31/2023  LOV: 12/17/2022  What is the name of the medication or equipment? [place] 3  Have you contacted your pharmacy to request a refill? Yes   Which pharmacy would you like this sent to?   [id number] [place], [place] - [address] [place] [address] Phone: [phone] Fax: [phone]    Patient notified that their request is being sent to the clinical staff for review and that they should receive a response within 2 business days.   Please advise at Mobile [phone] (mobile)

## 2023-01-31 NOTE — Telephone Encounter (Signed)
[facility] sent [facility]

## 2023-02-03 ENCOUNTER — Other Ambulatory Visit: Payer: Self-pay

## 2023-02-03 ENCOUNTER — Encounter: Payer: Self-pay | Admitting: Gastroenterology

## 2023-02-03 ENCOUNTER — Ambulatory Visit (INDEPENDENT_AMBULATORY_CARE_PROVIDER_SITE_OTHER): Payer: Medicare HMO | Admitting: Gastroenterology

## 2023-02-03 VITALS — BP 112/73 | HR 73 | Temp 98.2°F | Wt 215.8 lb

## 2023-02-03 DIAGNOSIS — E611 Iron deficiency: Secondary | ICD-10-CM | POA: Diagnosis not present

## 2023-02-03 DIAGNOSIS — D509 Iron deficiency anemia, unspecified: Secondary | ICD-10-CM

## 2023-02-03 DIAGNOSIS — K5909 Other constipation: Secondary | ICD-10-CM | POA: Diagnosis not present

## 2023-02-03 DIAGNOSIS — K7469 Other cirrhosis of liver: Secondary | ICD-10-CM | POA: Diagnosis not present

## 2023-02-03 DIAGNOSIS — Z23 Encounter for immunization: Secondary | ICD-10-CM | POA: Diagnosis not present

## 2023-02-03 DIAGNOSIS — Z86010 Personal history of colonic polyps: Secondary | ICD-10-CM

## 2023-02-03 MED ORDER — LACTULOSE 10 GM/15ML PO SOLN: 20.0000 g | Freq: Three times a day (TID) | ORAL | 12 refills | Status: DC

## 2023-02-03 NOTE — Patient Instructions (Addendum)
Patient declined the pneumococcal vaccine. Dr. Vicente Males was informed. MB  We have cancelled your ultrasound appointment and scheduled you a MRI of your liver.  Please remember that you need your second vaccine in a month and your last one in 6 months from 02/03/2023. Please schedule with the front desk.

## 2023-02-03 NOTE — Progress Notes (Signed)
Cameron Bellows MD, MRCP(U.K) [address]  [place]  [place], [place] [address]  Main: [phone]  Fax: [phone]   Primary Care Physician: Einar Pheasant, MD  Primary Gastroenterologist:  Dr. Jonathon Sanchez   Chief Complaint  Patient presents with   Cirrhosis    HPI: Cameron Sanchez is a 52 y.o. male   Summary of history :  He was last seen at our office back in December 2023 for cirrhosis of the liver.  Previously seen by Dr. Bonna Gains back in 2021 for reflux and constipation.  05/19/2022 right upper quadrant ultrasound shows cirrhotic morphology of the liver 1.9 cm hyperechoic mass within the right hepatic lobe 06/15/2022 MRI of the liver shows simple cyst corresponding to the prior ultrasound no specific follow-up required and an intermediate suspicious lesion seen in the posterior right lobe of the liver recommend MRI in 6 months to assess for stability.  Hepatic steatosis and splenomegaly   03/11/2022 EGD showed multiple gastric polyps biopsy taken that showed there were hyperplastic.  A colonoscopy was performed on the same day on 03/11/2022 10 polyps were resected.  One of the polyps showed low-grade dysplasia present at the cauterized base of the largest polyp seen with recommended to have repeat colonoscopy in 6 to 8 months to ensure no polyp seen at the polypectomy site   He states the diagnosis of liver cirrhosis is new.  Was never aware of it previously.  No family history of liver disorders.  No prior history of alcohol excess consumption illegal drug use, incarceration, military service, blood transfusions.    Interval history 11/02/2022-02/03/2023  11/02/2022 hepatitis A total antibody positive, not immune to hepatitis B.  Hepatitis C antibody negative.  Autoimmune workup negative ferritin low at 16 11/24/2022: Colonoscopy: Normal colon repeat colonoscopy in 1 year due to large number of polyps taken out. 12/21/2022: MRI of the liver showed features of cirrhosis no  suspicious lesions noted.  Splenomegaly.  He is doing well.  Complains of constipation not having a bowel meant for 2 to 3 days.  He has inversion of sleep rhythm.  No issues with memory.  Not interested in genetic testing.  Been taking the iron tablets.   Current Outpatient Medications  Medication Sig Dispense Refill   allopurinol (ZYLOPRIM) 300 MG tablet TAKE 1 TABLET BY MOUTH EVERY DAY 90 tablet 0   atorvastatin (LIPITOR) 40 MG tablet Take 1 tablet (40 mg total) by mouth daily. 90 tablet 1   bisacodyl (DULCOLAX) 5 MG EC tablet Take 5 mg by mouth daily as needed for moderate constipation.     Continuous Blood Gluc Sensor (FREESTYLE LIBRE 3 SENSOR) MISC Place 1 sensor on the skin every 14 days. Use to check glucose continuously 2 each 3   ferrous sulfate (FERROUSUL) 325 (65 FE) MG tablet Take 1 tablet (325 mg total) by mouth every other day. 15 tablet 1   insulin degludec (TRESIBA FLEXTOUCH) 200 UNIT/ML FlexTouch Pen Inject 20 Units into the skin daily. 15 mL 2   Insulin Pen Needle (RELION PEN NEEDLES) 31G X 6 MM MISC Use as directed 100 each 0   metaxalone (SKELAXIN) 800 MG tablet Take 800 mg by mouth 3 (three) times daily.     metFORMIN (GLUCOPHAGE) 500 MG tablet Take 1 tablet (500 mg total) by mouth 2 (two) times daily with a meal. 180 tablet 3   metoprolol succinate (TOPROL-XL) 50 MG 24 hr tablet TAKE 2 TABLETS BY MOUTH DAILY WITH OR IMMEDIATELY FOLLOWING  A MEAL. 180 tablet 0   oxyCODONE (ROXICODONE) 15 MG immediate release tablet Take 15 mg by mouth every 4 (four) hours.     pantoprazole (PROTONIX) 40 MG tablet TAKE 1 TABLET (40 MG TOTAL) BY MOUTH TWICE A DAY BEFORE MEALS 180 tablet 3   Semaglutide,0.25 or 0.5MG /DOS, (OZEMPIC, 0.25 OR 0.5 MG/DOSE,) 2 MG/3ML SOPN Inject 0.25 mg weekly for 4 weeks, then increase to 0.5 mg weekly 3 mL 2   triamterene-hydrochlorothiazide (MAXZIDE-25) 37.5-25 MG tablet TAKE 1 TABLET BY MOUTH EVERY DAY 90 tablet 0   No current facility-administered medications  for this visit.    Allergies as of 02/03/2023   (No Known Allergies)    ROS:  General: Negative for anorexia, weight loss, fever, chills, fatigue, weakness. ENT: Negative for hoarseness, difficulty swallowing , nasal congestion. CV: Negative for chest pain, angina, palpitations, dyspnea on exertion, peripheral edema.  Respiratory: Negative for dyspnea at rest, dyspnea on exertion, cough, sputum, wheezing.  GI: See history of present illness. GU:  Negative for dysuria, hematuria, urinary incontinence, urinary frequency, nocturnal urination.  Endo: Negative for unusual weight change.    Physical Examination:   BP 112/73   Pulse 73   Temp 98.2 F (36.8 C) (Oral)   Wt 215 lb 12.8 oz (97.9 kg)   BMI 33.80 kg/m   General: Well-nourished, well-developed in no acute distress.  Eyes: No icterus. Conjunctivae pink. Mouth: Oropharyngeal mucosa moist and pink , no lesions erythema or exudate. Neuro: Alert and oriented x 3.  Grossly intact.  No liver flap Skin: Warm and dry, no jaundice.   Psych: Alert and cooperative, normal mood and affect.   Imaging Studies: No results found.  Assessment and Plan:   Cameron Sanchez is a 52 y.o. y/o male for cirrhosis the liver.  Incidental finding on recent ultrasound.  There is an indeterminant liver lesion seen on MRI which requires its follow-up.  Recent screening colonoscopy showed over 10 polyps.  EGD showed no esophageal varices. Immune to hepatitis A not immune to hepatitis B.  Has sleep inversion may have minimal hepatic encephalopathy.  Likely cause of chronic liver disease is a cryptogenic or fatty liver disease   Plan 1.  MRI of the liver in 07/05/2023 to screen for [place] 2.  Requires hepatitis B vaccination, requires pneumococcal vaccination 3.  Genetic testing for multiple tubular adenomas seen on recent colonoscopy in April 2023 he is not interested 4.  Surveillance colonoscopy in January 2025 5.  Capsule study of the small bowel in  view of iron deficiency 6.  EGD to screen for esophageal varices in April 2026 7.  Oral iron ferrous sulfate 325 mg 1 tablet every other day to be continued.  Will recheck iron levels today 8.  Check CMP and INR to calculate MELD score   Risks, benefits, alternatives of Givens capsule discussed with patient to include but not limited to the rare risk of Given's capsule becoming lodged in the GI tract requiring surgical removal.  The patient agrees with this plan & consent will be obtained.  Dr Cameron Bellows  MD,MRCP [facility]) Follow up in 4 to 5 months

## 2023-02-04 ENCOUNTER — Encounter: Payer: Self-pay | Admitting: Internal Medicine

## 2023-02-04 DIAGNOSIS — E611 Iron deficiency: Secondary | ICD-10-CM | POA: Insufficient documentation

## 2023-02-04 LAB — PROTIME-INR
INR: 1.1 (ref 0.9–1.2)
Prothrombin Time: 11.4 s (ref 9.1–12.0)

## 2023-02-04 LAB — COMPREHENSIVE METABOLIC PANEL
ALT: 26 IU/L (ref 0–44)
AST: 29 IU/L (ref 0–40)
Albumin/Globulin Ratio: 2.2 (ref 1.2–2.2)
Albumin: 4.6 g/dL (ref 3.8–4.9)
Alkaline Phosphatase: 81 IU/L (ref 44–121)
BUN/Creatinine Ratio: 10 (ref 9–20)
BUN: 10 mg/dL (ref 6–24)
Bilirubin Total: 0.6 mg/dL (ref 0.0–1.2)
CO2: 23 mmol/L (ref 20–29)
Calcium: 9.5 mg/dL (ref 8.7–10.2)
Chloride: 100 mmol/L (ref 96–106)
Creatinine, Ser: 1.04 mg/dL (ref 0.76–1.27)
Globulin, Total: 2.1 g/dL (ref 1.5–4.5)
Glucose: 118 mg/dL — ABNORMAL HIGH (ref 70–99)
Potassium: 4.6 mmol/L (ref 3.5–5.2)
Sodium: 137 mmol/L (ref 134–144)
Total Protein: 6.7 g/dL (ref 6.0–8.5)
eGFR: 86 mL/min/{1.73_m2} (ref >59)

## 2023-02-04 LAB — IRON,TIBC AND FERRITIN PANEL
Ferritin: 20 ng/mL — ABNORMAL LOW (ref 30–400)
Iron Saturation: 11 % — ABNORMAL LOW (ref 15–55)
Iron: 42 ug/dL (ref 38–169)
Total Iron Binding Capacity: 397 ug/dL (ref 250–450)
UIBC: 355 ug/dL — ABNORMAL HIGH (ref 111–343)

## 2023-02-07 ENCOUNTER — Telehealth: Payer: Self-pay

## 2023-02-07 ENCOUNTER — Ambulatory Visit: Payer: Medicare HMO | Admitting: Nurse Practitioner

## 2023-02-07 NOTE — Telephone Encounter (Signed)
Pt scheduled for a Capsule Study on 02/08/23. Needs to reschedule. Please contact pt.

## 2023-02-07 NOTE — Telephone Encounter (Signed)
Patient would like to reschedule capsule study to 03/01/2023. Called Endo and talk to [facility] and she will get patient moved. Sent out new instructions to patient

## 2023-02-07 NOTE — Telephone Encounter (Signed)
Patient verbalized understanding of results  

## 2023-02-07 NOTE — Telephone Encounter (Signed)
-----   Message from [name], MD sent at 02/07/2023  9:51 AM EDT ----- IRON LEVELS still low - continue oral ironm and recheck in 3 mionths

## 2023-02-07 NOTE — Progress Notes (Signed)
IRON LEVELS still low - continue oral ironm and recheck in 3 mionths

## 2023-02-10 ENCOUNTER — Other Ambulatory Visit: Payer: Medicare HMO

## 2023-02-10 DIAGNOSIS — M961 Postlaminectomy syndrome, not elsewhere classified: Secondary | ICD-10-CM | POA: Diagnosis not present

## 2023-02-10 DIAGNOSIS — Z79891 Long term (current) use of opiate analgesic: Secondary | ICD-10-CM | POA: Diagnosis not present

## 2023-02-10 DIAGNOSIS — G894 Chronic pain syndrome: Secondary | ICD-10-CM | POA: Diagnosis not present

## 2023-02-10 DIAGNOSIS — M545 Low back pain, unspecified: Secondary | ICD-10-CM | POA: Diagnosis not present

## 2023-02-10 DIAGNOSIS — M5137 Other intervertebral disc degeneration, lumbosacral region: Secondary | ICD-10-CM | POA: Diagnosis not present

## 2023-02-17 ENCOUNTER — Other Ambulatory Visit: Payer: Medicare HMO | Admitting: Pharmacist

## 2023-02-21 ENCOUNTER — Encounter: Payer: Self-pay | Admitting: Nurse Practitioner

## 2023-02-23 ENCOUNTER — Telehealth: Payer: Self-pay | Admitting: Internal Medicine

## 2023-02-23 NOTE — Telephone Encounter (Signed)
Prescription Request  02/23/2023  LOV: 12/17/2022  What is the name of the medication or equipment? Continuous Blood Gluc Sensor (FREESTYLE LIBRE 3 SENSOR) MISC, insulin degludec (TRESIBA FLEXTOUCH) 200 UNIT/ML FlexTouch Pen, Insulin Pen Needle (RELION PEN NEEDLES) 31G X 6 MM MISC and Semaglutide,0.25 or 0.5MG /DOS, (OZEMPIC, 0.25 OR 0.5 MG/DOSE,) 2 MG/3ML SOPN  Have you contacted your pharmacy to request a refill? Yes   Which pharmacy would you like this sent to?   [id number] [place], [place] - [address] [place] [address] Phone: [phone] Fax: [phone]    Patient notified that their request is being sent to the clinical staff for review and that they should receive a response within 2 business days.   Please advise at Mobile [phone] (mobile)   As per pt, she would like Dr.Scott assistant to call him because Ozempic its giving him upset stomach.

## 2023-02-23 NOTE — Telephone Encounter (Signed)
Contacted Cameron Sanchez to schedule their annual wellness visit. Appointment made for 02/28/2023.  Thank you,  [facility] Support [facility] Direct dial  [phone]

## 2023-02-25 ENCOUNTER — Other Ambulatory Visit: Payer: Medicare HMO | Admitting: Pharmacist

## 2023-02-25 ENCOUNTER — Other Ambulatory Visit: Payer: Self-pay | Admitting: Internal Medicine

## 2023-02-25 ENCOUNTER — Other Ambulatory Visit (HOSPITAL_COMMUNITY): Payer: Self-pay

## 2023-02-25 DIAGNOSIS — E1149 Type 2 diabetes mellitus with other diabetic neurological complication: Secondary | ICD-10-CM

## 2023-02-25 MED ORDER — METFORMIN HCL ER 500 MG PO TB24: 500.0000 mg | ORAL_TABLET | Freq: Every day | ORAL | 2 refills | Status: DC

## 2023-02-25 MED ORDER — OZEMPIC (0.25 OR 0.5 MG/DOSE) 2 MG/3ML ~~LOC~~ SOPN: 0.5000 mg | PEN_INJECTOR | SUBCUTANEOUS | 2 refills | Status: DC

## 2023-02-25 MED ORDER — PEN NEEDLES 32G X 4 MM MISC: 2 refills | Status: DC

## 2023-02-25 MED ORDER — TRESIBA FLEXTOUCH 200 UNIT/ML ~~LOC~~ SOPN
20.0000 [IU] | PEN_INJECTOR | Freq: Every day | SUBCUTANEOUS | 2 refills | Status: DC
Start: 2023-02-25 — End: 2023-05-18

## 2023-02-25 NOTE — Patient Instructions (Signed)
Casimiro Needle,   Purchase over the counter iron - ferrous sulfate 325 mg. Take this every other day per Dr. Tobi Bastos.   Decrease Tresiba to 18 units. Continue Ozempic at 0.25 mg weekly and 1 metformin tablet daily. Focus on increasing fibers in your diet with fruits, vegetables, and whole grains, as well as drinking plenty of fluids. You can try OTC metamucil as well for extra fiber.   We'll submit the patient assistance application for Ozempic and [place].   Thanks!  [name] Eppie Gibson, PharmD, BCACP, CPP [facility] [phone]

## 2023-02-25 NOTE — Telephone Encounter (Signed)
See Cameron Sanchez note for medication adjustment and refills.

## 2023-02-25 NOTE — Progress Notes (Signed)
02/25/2023 Name: Cameron Sanchez MRN: [id number] DOB: [birth year]  Chief Complaint  Patient presents with   Medication Management   Diabetes    Cameron Sanchez is a 52 y.o. year old male who presented for a telephone visit.   They were referred to the pharmacist by their PCP for assistance in managing diabetes.    Subjective:  Care Team: Primary Care Provider: Dale [place], MD ; Next Scheduled Visit: 03/17/23  Medication Access/Adherence  [facility]  [id number] [place] [place] - [address] [place] [address] Phone: [phone] Fax: [phone]   Patient reports affordability concerns with their medications: Yes  Patient reports access/transportation concerns to their pharmacy: No  Patient reports adherence concerns with their medications:  No    Patient also asks about iron supplement. He notes he never received it at the pharmacy.   Diabetes:  Current medications: metformin 500 mg twice daily- but taking once daily, Ozempic 0.25 mg weekly, Tresiba 20 units daily  Notes he requested refills a few days ago, has been out of insulin for 2 days.   Reports he had increased the Ozempic to 0.5 mg weekly, but had more stomach upset, constipation, even some vomiting. Does note that he has consistently had constipation related to opioids prior to starting GLP1. Some improvement since reducing to 0.25 mg 2 weeks ago. Notes that his diet has been different than normal because of this upset.    Patient reports hypoglycemic s/sx including dizziness, shakiness, sweating - 1 episode a few days ago. Has been without CGM because too soon to refill, as he knocked one off. He did call the manufacturer and they did not call him back about sending a replacement  Date of Download: 3/21-4/./24 % Time CGM is active: 95% Average Glucose: 149 mg/dL Glucose Management Indicator: 6.9  Glucose Variability: 18.4 (goal <36%) Time in Goal:  - Time in range 70-180:  88% - Time above range: 12% - Time below range: 0%   Current meal patterns:  - Drinks: has significant cut back on sugar in sweet tea, but does note periodic sweets. Has been working on limiting frequency  Current medication access support: forgot that he was asked to provide POI for [employer] application. We discussed today, he is unsure where his social security statement would be so he prefers that we go ahead and submit.   Hypertension:  Current medications: triamterene/HCTZ 37.5/25 mg daily, metoprolol succinate 100 mg daily  Hyperlipidemia/ASCVD Risk Reduction  Current lipid lowering medications: atorvastatin 40 mg daily    Objective:  Lab Results  Component Value Date   HGBA1C 9.1 (H) 12/31/2022    Lab Results  Component Value Date   CREATININE 1.04 02/03/2023   BUN 10 02/03/2023   NA 137 02/03/2023   K 4.6 02/03/2023   CL 100 02/03/2023   CO2 23 02/03/2023    Lab Results  Component Value Date   CHOL 130 12/31/2022   HDL 48.00 12/31/2022   LDLCALC 53 12/31/2022   LDLDIRECT 53.0 09/08/2022   TRIG 144.0 12/31/2022   CHOLHDL 3 12/31/2022    Medications Reviewed Today     Reviewed by Alden Hipp, RPH-CPP (Pharmacist) on 02/25/23 at 1005  Med List Status: <None>   Medication Order Taking? Sig Documenting Provider Last Dose Status Informant  allopurinol (ZYLOPRIM) 300 MG tablet [id number] Yes TAKE 1 TABLET BY MOUTH EVERY DAY Dale Banks, MD Taking Active   atorvastatin (LIPITOR) 40 MG tablet  [id number] Yes Take 1 tablet (40 mg total) by mouth daily. Dale [place], MD Taking Active   bisacodyl (DULCOLAX) 5 MG EC tablet [id number]  Take 5 mg by mouth daily as needed for moderate constipation. [provider]  Active   Continuous Blood Gluc Sensor (FREESTYLE LIBRE 3 SENSOR) [place] [id number] Yes Place 1 sensor on the skin every 14 days. Use to check glucose continuously Dale [place], MD Taking Active   ferrous sulfate (FERROUSUL) 325 (65 FE)  MG tablet [id number] Yes Take 1 tablet (325 mg total) by mouth every other day. Wyline Mood, MD Taking Active   insulin degludec (TRESIBA FLEXTOUCH) 200 UNIT/ML FlexTouch Pen [id number] Yes Inject 20 Units into the skin daily. Dale Armour, MD Taking Active   Insulin Pen Needle (RELION PEN NEEDLES) 31G X 6 MM MISC [id number]  Use as directed Dale [place], MD  Active   lactulose (CHRONULAC) 10 GM/15ML solution [id number] Yes Take 30 mLs (20 g total) by mouth in the morning, at noon, and at bedtime. Wyline Mood, MD Taking Active   metaxalone [facility]) 800 MG tablet [id number] Yes Take 800 mg by mouth 3 (three) times daily. [provider] Taking Active   metFORMIN (GLUCOPHAGE) 500 MG tablet [id number] Yes Take 1 tablet (500 mg total) by mouth 2 (two) times daily with a meal. Dale [place], MD Taking Active   metoprolol succinate (TOPROL-XL) 50 MG 24 hr tablet [id number] Yes TAKE 2 TABLETS BY MOUTH DAILY WITH OR IMMEDIATELY FOLLOWING A MEAL. Dale [place], MD Taking Active   oxyCODONE (ROXICODONE) 15 MG immediate release tablet [id number]  Take 15 mg by mouth every 4 (four) hours. [provider]  Active   pantoprazole (PROTONIX) 40 MG tablet [id number] Yes TAKE 1 TABLET (40 MG TOTAL) BY MOUTH TWICE A DAY BEFORE MEALS Dale [place], MD Taking Active   Semaglutide,0.25 or 0.5MG /DOS, (OZEMPIC, 0.25 OR 0.5 MG/DOSE,) 2 MG/3ML SOPN [id number] Yes Inject 0.25 mg weekly for 4 weeks, then increase to 0.5 mg weekly Dale [place], MD Taking Active   triamterene-hydrochlorothiazide (MAXZIDE-25) 37.5-25 MG tablet [id number] Yes TAKE 1 TABLET BY MOUTH EVERY DAY Dale Repton, MD Taking Active               Assessment/Plan:   Discussed that ferrous sulfate is OTC so it could be that his insurance didn't cover. Advised to purchase OTC ferrous sulfate 325 mg and take 1 tablet every other day.   Diabetes: - Currently significantly improved, though with tolerability issues.  - Recommend  to continue Ozempic 0.25 mg weekly at this time. Reduce Tresiba to 18 units daily due to hypoglycemic symptoms. Continue metformin 500 mg daily, will send script for XR 500 to see if improved tolerability. Patient will also pick up [facility] sensors as he is able to refill today.  - Refills sent as patient requested.  - Patient is unsure where his social security income statement is, so requests that we go ahead and submit [employer] application without proof of income, and let him know if they request it. Will collaborate with CPhT   Hypertension: - Currently controlled - Recommend to continue current regimen   Hyperlipidemia/ASCVD Risk Reduction: - Currently controlled.  - Recommend to continue current regimen    Follow Up Plan: PCP in ~ 2 weeks, PharmD phone call in ~ 6-8   [name] TClearance Coots, PharmD, BCACP, CPP [facility] [phone]

## 2023-02-27 ENCOUNTER — Other Ambulatory Visit: Payer: Self-pay | Admitting: Internal Medicine

## 2023-02-28 ENCOUNTER — Ambulatory Visit (INDEPENDENT_AMBULATORY_CARE_PROVIDER_SITE_OTHER): Payer: Medicare HMO

## 2023-02-28 VITALS — Ht 67.0 in | Wt 215.0 lb

## 2023-02-28 DIAGNOSIS — Z0000 Encounter for general adult medical examination without abnormal findings: Secondary | ICD-10-CM | POA: Diagnosis not present

## 2023-02-28 NOTE — Progress Notes (Signed)
Subjective:   RICCO DERSHEM is a 52 y.o. male who presents for Medicare Annual/Subsequent preventive examination.  Review of Systems    No ROS.  Medicare Wellness Virtual Visit.  Visual/audio telehealth visit, UTA vital signs.   See social history for additional risk factors.   Cardiac Risk Factors include: advanced age (>34men, >79 women);male gender;diabetes mellitus     Objective:    Today's Vitals   02/28/23 1116  Weight: 215 lb (97.5 kg)  Height:  (1.702 m)   Body mass index is 33.67 kg/m.     02/28/2023   11:17 AM 11/24/2022    8:14 AM 11/04/2022    1:12 PM 10/14/2022   11:44 AM 08/30/2022    1:36 PM 03/11/2022    8:09 AM 02/24/2022   11:42 AM  Advanced Directives  Does Patient Have a Medical Advance Directive? No No No No No No No  Would patient like information on creating a medical advance directive? No - Patient declined  No - Patient declined    No - Patient declined    Current Medications (verified) Outpatient Encounter Medications as of 02/28/2023  Medication Sig   allopurinol (ZYLOPRIM) 300 MG tablet TAKE 1 TABLET BY MOUTH EVERY DAY   atorvastatin (LIPITOR) 40 MG tablet Take 1 tablet (40 mg total) by mouth daily.   bisacodyl (DULCOLAX) 5 MG EC tablet Take 5 mg by mouth daily as needed for moderate constipation.   Continuous Blood Gluc Sensor (FREESTYLE LIBRE 3 SENSOR) MISC Place 1 sensor on the skin every 14 days. Use to check glucose continuously   ferrous sulfate (FERROUSUL) 325 (65 FE) MG tablet Take 1 tablet (325 mg total) by mouth every other day.   insulin degludec (TRESIBA FLEXTOUCH) 200 UNIT/ML FlexTouch Pen Inject 20 Units into the skin daily.   Insulin Pen Needle (PEN NEEDLES) 32G X 4 MM MISC Use to inject medication daily   lactulose (CHRONULAC) 10 GM/15ML solution Take 30 mLs (20 g total) by mouth in the morning, at noon, and at bedtime.   metaxalone (SKELAXIN) 800 MG tablet Take 800 mg by mouth 3 (three) times daily.   metFORMIN  (GLUCOPHAGE-XR) 500 MG 24 hr tablet Take 1 tablet (500 mg total) by mouth daily with breakfast.   metoprolol succinate (TOPROL-XL) 50 MG 24 hr tablet TAKE 2 TABLETS BY MOUTH DAILY WITH OR IMMEDIATELY FOLLOWING A MEAL.   oxyCODONE (ROXICODONE) 15 MG immediate release tablet Take 15 mg by mouth every 4 (four) hours.   pantoprazole (PROTONIX) 40 MG tablet TAKE 1 TABLET (40 MG TOTAL) BY MOUTH TWICE A DAY BEFORE MEALS   Semaglutide,0.25 or 0.5MG /DOS, (OZEMPIC, 0.25 OR 0.5 MG/DOSE,) 2 MG/3ML SOPN Inject 0.5 mg into the skin once a week.   triamterene-hydrochlorothiazide (MAXZIDE-25) 37.5-25 MG tablet TAKE 1 TABLET BY MOUTH EVERY DAY   No facility-administered encounter medications on file as of 02/28/2023.    Allergies (verified) Patient has no known allergies.   History: Past Medical History:  Diagnosis Date   Allergy    Anemia    Aortic atherosclerosis    Chronic back pain    Chronic back pain    Cirrhosis of liver    Complication of anesthesia    hard time to wake up x1   COVID-19    DM (diabetes mellitus), type 2    GERD (gastroesophageal reflux disease)    Gout    Hypercholesterolemia    Hypertension    Liver lesion    Migraines  Obesity    Sleep apnea    Subarachnoid hemorrhage 11/2020   no surgery   Thrombocytopenia    TIA (transient ischemic attack)    Past Surgical History:  Procedure Laterality Date   BACK SURGERY  08/12/2014   multiple   BOTOX INJECTION N/A 11/04/2022   Procedure: BOTOX INJECTION;  Surgeon: Leafy Ro, MD;  Location: [facility] ORS;  Service: General;  Laterality: N/A;   CHOLECYSTECTOMY     COLONOSCOPY WITH PROPOFOL N/A 03/11/2022   Procedure: COLONOSCOPY WITH PROPOFOL;  Surgeon: Wyline Mood, MD;  Location: [facility] ENDOSCOPY;  Service: Gastroenterology;  Laterality: N/A;   COLONOSCOPY WITH PROPOFOL N/A 11/17/2022   Procedure: COLONOSCOPY WITH PROPOFOL;  Surgeon: Wyline Mood, MD;  Location: [facility] ENDOSCOPY;  Service: Gastroenterology;  Laterality: N/A;    COLONOSCOPY WITH PROPOFOL N/A 11/24/2022   Procedure: COLONOSCOPY WITH PROPOFOL;  Surgeon: Wyline Mood, MD;  Location: [facility] ENDOSCOPY;  Service: Gastroenterology;  Laterality: N/A;   ESOPHAGOGASTRODUODENOSCOPY N/A 03/11/2022   Procedure: ESOPHAGOGASTRODUODENOSCOPY (EGD);  Surgeon: Wyline Mood, MD;  Location: [facility] ENDOSCOPY;  Service: Gastroenterology;  Laterality: N/A;   KNEE ARTHROSCOPY Left    Dr Monica Martinez   SPHINCTEROTOMY N/A 11/04/2022   Procedure: SPHINCTEROTOMY;  Surgeon: Leafy Ro, MD;  Location: [facility] ORS;  Service: General;  Laterality: N/A;   Family History  Problem Relation Age of Onset   Cancer Father        germ cell (retroperitoneal)   Coronary artery disease Other        s/p stent (age 76)   Hypertension Paternal Grandfather    Kidney cancer Paternal Grandfather    Diabetes Paternal Grandfather    Diabetes Maternal Grandfather    Heart disease Cousin        MI - age 15   Social History   Socioeconomic History   Marital status: Married    Spouse name: Not on file   Number of children: 2   Years of education: 12   Highest education level: Not on file  Occupational History   Occupation: disabled    Employer: [employer]  Tobacco Use   Smoking status: Never   Smokeless tobacco: Never  Vaping Use   Vaping Use: Never used  Substance and Sexual Activity   Alcohol use: No    Alcohol/week: 0.0 standard drinks of alcohol   Drug use: No   Sexual activity: Not on file  Other Topics Concern   Not on file  Social History Narrative   Lives with girlfriend in a one story home.  Has 2 sons.  Trying to get disability due to several back issues.  Education: high school.    Social Determinants of Health   Financial Resource Strain: Low Risk  (02/28/2023)   Overall Financial Resource Strain (CARDIA)    Difficulty of Paying Living Expenses: Not hard at all  Food Insecurity: No Food Insecurity (02/28/2023)   Hunger Vital Sign    Worried About Running Out of Food  in the Last Year: Never true    Ran Out of Food in the Last Year: Never true  Transportation Needs: No Transportation Needs (02/28/2023)   PRAPARE - Administrator, Civil Service (Medical): No    Lack of Transportation (Non-Medical): No  Physical Activity: Not on file  Stress: No Stress Concern Present (02/28/2023)   [employer] of Occupational Health - Occupational Stress Questionnaire    Feeling of Stress : Not at all  Social Connections: Unknown (02/28/2023)   Social Connection  and Isolation Panel [NHANES]    Frequency of Communication with Friends and Family: Not on file    Frequency of Social Gatherings with Friends and Family: Not on file    Attends Religious Services: Not on file    Active Member of Clubs or Organizations: Not on file    Attends Banker Meetings: Not on file    Marital Status: Married    Tobacco Counseling Counseling given: Not Answered   Clinical Intake:  Pre-visit preparation completed: Yes        Diabetes: No  How often do you need to have someone help you when you read instructions, pamphlets, or other written materials from your doctor or pharmacy?: 1 - Never    Interpreter Needed?: No      Activities of Daily Living    02/28/2023   11:18 AM 11/03/2022    8:20 AM  In your present state of health, do you have any difficulty performing the following activities:  Hearing? 0   Vision? 0   Difficulty concentrating or making decisions? 0   Walking or climbing stairs? 0   Dressing or bathing? 0   Doing errands, shopping? 0 0  Preparing Food and eating ? N   Using the Toilet? N   In the past six months, have you accidently leaked urine? N   Do you have problems with loss of bowel control? N   Managing your Medications? N   Managing your Finances? N   Housekeeping or managing your Housekeeping? N     Patient Care Team: Dale Laredo, MD as PCP - General (Internal Medicine) Jeralyn Ruths, MD as  Consulting Physician (Oncology)  Indicate any recent Medical Services you may have received from other than [facility] providers in the past year (date may be approximate).     Assessment:   This is a routine wellness examination for Wagner.  I connected with  Wilfrid Lund on 02/28/23 by a audio enabled telemedicine application and verified that I am speaking with the correct person using two identifiers.  Patient Location: Home  Provider Location: Office/Clinic  I discussed the limitations of evaluation and management by telemedicine. The patient expressed understanding and agreed to proceed.   Hearing/Vision screen Hearing Screening - Comments:: Patient is able to hear conversational tones without difficulty. No issues reported. Vision Screening - Comments:: Followed by [facility] readers  No retinopathy reported They have seen their ophthalmologist in the last 12 months.    Dietary issues and exercise activities discussed: Current Exercise Habits: Home exercise routine, Type of exercise: walking, Intensity: Mild   Goals Addressed             This Visit's Progress    Maintain healthy lifestyle       Stay active Stay hydrated Healthy diet       Depression Screen    02/28/2023   11:20 AM 09/15/2022   10:44 AM 07/14/2022   11:01 AM 06/16/2022    9:11 AM 02/24/2022   11:42 AM 08/27/2021    7:32 AM 05/25/2021    9:17 AM  PHQ 2/9 Scores  PHQ - 2 Score 0 1 0 0 0 0 0    Fall Risk    02/28/2023   11:18 AM 09/15/2022   10:44 AM 07/14/2022   11:01 AM 06/16/2022    9:10 AM 02/24/2022   11:43 AM  Fall Risk   Falls in the past year? 0 0 0 0 0  Number  falls in past yr: 0 0 0 0 0  Injury with Fall? 0 0 0 0   Risk for fall due to :  No Fall Risks No Fall Risks No Fall Risks   Follow up Falls evaluation completed;Falls prevention discussed Falls evaluation completed Falls evaluation completed Falls evaluation completed Falls evaluation completed    FALL RISK  PREVENTION PERTAINING TO THE HOME: Home free of loose throw rugs in walkways, pet beds, electrical cords, etc? Yes  Adequate lighting in your home to reduce risk of falls? Yes   ASSISTIVE DEVICES UTILIZED TO PREVENT FALLS: Life alert? No  Use of a cane, walker or w/c? Yes , as needed Grab bars in the bathroom? No  Shower chair or bench in shower? No  Elevated toilet seat or a handicapped toilet? No   TIMED UP AND GO: Was the test performed? No .   Cognitive Function:        02/28/2023   11:22 AM 02/24/2022   11:47 AM 02/18/2021    4:08 PM  6CIT Screen  What Year? 0 points 0 points 0 points  What month? 0 points 0 points 0 points  What time? 0 points 0 points 0 points  Count back from 20 0 points  0 points  Months in reverse 0 points 0 points 0 points  Repeat phrase 0 points  0 points  Total Score 0 points  0 points    Immunizations Immunization History  Administered Date(s) Administered   Hepb-cpg 02/03/2023   Moderna Sars-Covid-2 Vaccination 04/15/2020, 05/14/2020   PFIZER(Purple Top)SARS-COV-2 Vaccination 07/16/2020, 08/12/2020   Tdap 11/29/2020   Screening Tests Health Maintenance  Topic Date Due   OPHTHALMOLOGY EXAM  Never done   COVID-19 Vaccine (5 - 2023-24 season) 03/16/2023 (Originally 07/16/2022)   Zoster Vaccines- Shingrix (1 of 2) 03/17/2023 (Originally [birth year]/1988)   Diabetic kidney evaluation - Urine ACR  06/11/2023   INFLUENZA VACCINE  06/16/2023   HEMOGLOBIN A1C  07/01/2023   FOOT EXAM  07/15/2023   COLONOSCOPY (Pts 45-51yrs Insurance coverage will need to be confirmed)  11/25/2023   Diabetic kidney evaluation - eGFR measurement  02/03/2024   Medicare Annual Wellness (AWV)  02/28/2024   DTaP/Tdap/Td (2 - Td or Tdap) 11/29/2030   Hepatitis C Screening  Completed   HIV Screening  Completed   HPV VACCINES  Aged Out    Health Maintenance Health Maintenance Due  Topic Date Due   OPHTHALMOLOGY EXAM  Never done   Lung Cancer Screening: (Low Dose CT  Chest recommended if Age 51-80 years, 30 pack-year currently smoking OR have quit w/in 15years.) does not qualify.   Hepatitis C Screening: Completed 10/2022.  Vision Screening: Recommended annual ophthalmology exams for early detection of glaucoma and other disorders of the eye. Agrees to schedule annual exam and update record upon completion.   Dental Screening: Recommended annual dental exams for proper oral hygiene  Community Resource Referral / Chronic Care Management: CRR required this visit?  No   CCM required this visit?  No      Plan:     I have personally reviewed and noted the following in the patient's chart:   Medical and social history Use of alcohol, tobacco or illicit drugs  Current medications and supplements including opioid prescriptions. Patient is currently taking opioid prescriptions. Information provided to patient regarding non-opioid alternatives. Patient advised to discuss non-opioid treatment plan with their provider. Functional ability and status Nutritional status Physical activity Advanced directives List of other physicians Hospitalizations,  surgeries, and ER visits in previous 12 months Vitals Screenings to include cognitive, depression, and falls Referrals and appointments  In addition, I have reviewed and discussed with patient certain preventive protocols, quality metrics, and best practice recommendations. A written personalized care plan for preventive services as well as general preventive health recommendations were provided to patient.     Cathey Endow, LPN   1/61/0960

## 2023-02-28 NOTE — Patient Instructions (Addendum)
[name] , Thank you for taking time to come for your Medicare Wellness Visit. I appreciate your ongoing commitment to your health goals. Please review the following plan we discussed and let me know if I can assist you in the future.   These are the goals we discussed:  Goals      DIET - low carb diet/high fiber     Maintain healthy lifestyle     Stay active Stay hydrated Healthy diet        This is a list of the screening recommended for you and due dates:  Health Maintenance  Topic Date Due   Eye exam for diabetics  Never done   COVID-19 Vaccine (5 - 2023-24 season) 03/16/2023*   Zoster (Shingles) Vaccine (1 of 2) 03/17/2023*   Yearly kidney health urinalysis for diabetes  06/11/2023   Flu Shot  06/16/2023   Hemoglobin A1C  07/01/2023   Complete foot exam   07/15/2023   Colon Cancer Screening  11/25/2023   Yearly kidney function blood test for diabetes  02/03/2024   Medicare Annual Wellness Visit  02/28/2024   DTaP/Tdap/Td vaccine (2 - Td or Tdap) 11/29/2030   Hepatitis C Screening: USPSTF Recommendation to screen - Ages 75-79 yo.  Completed   HIV Screening  Completed   HPV Vaccine  Aged Out  *Topic was postponed. The date shown is not the original due date.    Advanced directives: not yet completed  Conditions/risks identified: none new  Next appointment: Follow up in one year for your annual wellness visit   Preventive Care 40-64 Years, Male Preventive care refers to lifestyle choices and visits with your health care provider that can promote health and wellness. What does preventive care include? A yearly physical exam. This is also called an annual well check. Dental exams once or twice a year. Routine eye exams. Ask your health care provider how often you should have your eyes checked. Personal lifestyle choices, including: Daily care of your teeth and gums. Regular physical activity. Eating a healthy diet. Avoiding tobacco and drug use. Limiting alcohol  use. Practicing safe sex. Taking low-dose aspirin every day starting at age 23. What happens during an annual well check? The services and screenings done by your health care provider during your annual well check will depend on your age, overall health, lifestyle risk factors, and family history of disease. Counseling  Your health care provider may ask you questions about your: Alcohol use. Tobacco use. Drug use. Emotional well-being. Home and relationship well-being. Sexual activity. Eating habits. Work and work Astronomer. Screening  You may have the following tests or measurements: Height, weight, and BMI. Blood pressure. Lipid and cholesterol levels. These may be checked every 5 years, or more frequently if you are over 55 years old. Skin check. Lung cancer screening. You may have this screening every year starting at age 71 if you have a 30-pack-year history of smoking and currently smoke or have quit within the past 15 years. Fecal occult blood test (FOBT) of the stool. You may have this test every year starting at age 99. Flexible sigmoidoscopy or colonoscopy. You may have a sigmoidoscopy every 5 years or a colonoscopy every 10 years starting at age 59. Prostate cancer screening. Recommendations will vary depending on your family history and other risks. Hepatitis C blood test. Hepatitis B blood test. Sexually transmitted disease (STD) testing. Diabetes screening. This is done by checking your blood sugar (glucose) after you have not eaten for a  while (fasting). You may have this done every 1-3 years. Discuss your test results, treatment options, and if necessary, the need for more tests with your health care provider. Vaccines  Your health care provider may recommend certain vaccines, such as: Influenza vaccine. This is recommended every year. Tetanus, diphtheria, and acellular pertussis (Tdap, Td) vaccine. You may need a Td booster every 10 years. Zoster vaccine. You may  need this after age 47. Pneumococcal 13-valent conjugate (PCV13) vaccine. You may need this if you have certain conditions and have not been vaccinated. Pneumococcal polysaccharide (PPSV23) vaccine. You may need one or two doses if you smoke cigarettes or if you have certain conditions. Talk to your health care provider about which screenings and vaccines you need and how often you need them. This information is not intended to replace advice given to you by your health care provider. Make sure you discuss any questions you have with your health care provider. Document Released: 11/28/2015 Document Revised: 07/21/2016 Document Reviewed: 09/02/2015 Elsevier Interactive Patient Education  2017 [employer].  Fall Prevention in the Home Falls can cause injuries. They can happen to people of all ages. There are many things you can do to make your home safe and to help prevent falls. What can I do on the outside of my home? Regularly fix the edges of walkways and driveways and fix any cracks. Remove anything that might make you trip as you walk through a door, such as a raised step or threshold. Trim any bushes or trees on the path to your home. Use bright outdoor lighting. Clear any walking paths of anything that might make someone trip, such as rocks or tools. Regularly check to see if handrails are loose or broken. Make sure that both sides of any steps have handrails. Any raised decks and porches should have guardrails on the edges. Have any leaves, snow, or ice cleared regularly. Use sand or salt on walking paths during winter. Clean up any spills in your garage right away. This includes oil or grease spills. What can I do in the bathroom? Use night lights. Install grab bars by the toilet and in the tub and shower. Do not use towel bars as grab bars. Use non-skid mats or decals in the tub or shower. If you need to sit down in the shower, use a plastic, non-slip stool. Keep the floor dry.  Clean up any water that spills on the floor as soon as it happens. Remove soap buildup in the tub or shower regularly. Attach bath mats securely with double-sided non-slip rug tape. Do not have throw rugs and other things on the floor that can make you trip. What can I do in the bedroom? Use night lights. Make sure that you have a light by your bed that is easy to reach. Do not use any sheets or blankets that are too big for your bed. They should not hang down onto the floor. Have a firm chair that has side arms. You can use this for support while you get dressed. Do not have throw rugs and other things on the floor that can make you trip. What can I do in the kitchen? Clean up any spills right away. Avoid walking on wet floors. Keep items that you use a lot in easy-to-reach places. If you need to reach something above you, use a strong step stool that has a grab bar. Keep electrical cords out of the way. Do not use floor polish or wax that  makes floors slippery. If you must use wax, use non-skid floor wax. Do not have throw rugs and other things on the floor that can make you trip. What can I do with my stairs? Do not leave any items on the stairs. Make sure that there are handrails on both sides of the stairs and use them. Fix handrails that are broken or loose. Make sure that handrails are as long as the stairways. Check any carpeting to make sure that it is firmly attached to the stairs. Fix any carpet that is loose or worn. Avoid having throw rugs at the top or bottom of the stairs. If you do have throw rugs, attach them to the floor with carpet tape. Make sure that you have a light switch at the top of the stairs and the bottom of the stairs. If you do not have them, ask someone to add them for you. What else can I do to help prevent falls? Wear shoes that: Do not have high heels. Have rubber bottoms. Are comfortable and fit you well. Are closed at the toe. Do not wear sandals. If  you use a stepladder: Make sure that it is fully opened. Do not climb a closed stepladder. Make sure that both sides of the stepladder are locked into place. Ask someone to hold it for you, if possible. Clearly mark and make sure that you can see: Any grab bars or handrails. First and last steps. Where the edge of each step is. Use tools that help you move around (mobility aids) if they are needed. These include: Canes. Walkers. Scooters. Crutches. Turn on the lights when you go into a dark area. Replace any light bulbs as soon as they burn out. Set up your furniture so you have a clear path. Avoid moving your furniture around. If any of your floors are uneven, fix them. If there are any pets around you, be aware of where they are. Review your medicines with your doctor. Some medicines can make you feel dizzy. This can increase your chance of falling. Ask your doctor what other things that you can do to help prevent falls. This information is not intended to replace advice given to you by your health care provider. Make sure you discuss any questions you have with your health care provider. Document Released: 08/28/2009 Document Revised: 04/08/2016 Document Reviewed: 12/06/2014 Elsevier Interactive Patient Education  2017 [employer].

## 2023-03-01 ENCOUNTER — Encounter: Admission: RE | Payer: Self-pay | Source: Home / Self Care

## 2023-03-01 ENCOUNTER — Encounter: Payer: Self-pay | Admitting: Anesthesiology

## 2023-03-01 ENCOUNTER — Ambulatory Visit: Admission: RE | Admit: 2023-03-01 | Payer: Medicare HMO | Source: Home / Self Care | Admitting: Gastroenterology

## 2023-03-01 SURGERY — IMAGING PROCEDURE, GI TRACT, INTRALUMINAL, VIA CAPSULE

## 2023-03-04 ENCOUNTER — Other Ambulatory Visit: Payer: Self-pay | Admitting: Internal Medicine

## 2023-03-07 ENCOUNTER — Telehealth: Payer: Self-pay

## 2023-03-07 ENCOUNTER — Ambulatory Visit: Payer: Medicare HMO

## 2023-03-07 NOTE — Telephone Encounter (Signed)
Pt was not charged a no show fee 

## 2023-03-07 NOTE — Telephone Encounter (Signed)
Pt request call back to r/s capsule study..the patient reports that he had called about r/s previous procedure and no one ever called him back... So, this appt should have been r/s as well due to him not having the procedure

## 2023-03-08 NOTE — Telephone Encounter (Signed)
Called patient and left him a voicemail to return my call.

## 2023-03-09 NOTE — Telephone Encounter (Signed)
Received notification from [employer] regarding approval for OZEMPIC. Patient assistance approved from 03/09/2023 to [id number].  Phone: [phone]  [facility] [phone]) [phone]   Pleased be advised

## 2023-03-10 DIAGNOSIS — M545 Low back pain, unspecified: Secondary | ICD-10-CM | POA: Diagnosis not present

## 2023-03-10 DIAGNOSIS — M5137 Other intervertebral disc degeneration, lumbosacral region: Secondary | ICD-10-CM | POA: Diagnosis not present

## 2023-03-10 DIAGNOSIS — Z79891 Long term (current) use of opiate analgesic: Secondary | ICD-10-CM | POA: Diagnosis not present

## 2023-03-10 DIAGNOSIS — M961 Postlaminectomy syndrome, not elsewhere classified: Secondary | ICD-10-CM | POA: Diagnosis not present

## 2023-03-10 DIAGNOSIS — G894 Chronic pain syndrome: Secondary | ICD-10-CM | POA: Diagnosis not present

## 2023-03-16 ENCOUNTER — Telehealth: Payer: Self-pay

## 2023-03-16 NOTE — Telephone Encounter (Signed)
Patient left a voicemail and is wanting to reschedule capsule study with Dr. Tobi Bastos. Please call patient to schedule this appointment

## 2023-03-17 ENCOUNTER — Encounter: Payer: Self-pay | Admitting: Internal Medicine

## 2023-03-17 ENCOUNTER — Ambulatory Visit (INDEPENDENT_AMBULATORY_CARE_PROVIDER_SITE_OTHER): Payer: Medicare HMO | Admitting: Internal Medicine

## 2023-03-17 VITALS — BP 114/70 | HR 83 | Temp 98.3°F | Resp 16 | Ht 68.0 in | Wt 215.6 lb

## 2023-03-17 DIAGNOSIS — I10 Essential (primary) hypertension: Secondary | ICD-10-CM | POA: Diagnosis not present

## 2023-03-17 DIAGNOSIS — G8929 Other chronic pain: Secondary | ICD-10-CM | POA: Diagnosis not present

## 2023-03-17 DIAGNOSIS — D696 Thrombocytopenia, unspecified: Secondary | ICD-10-CM

## 2023-03-17 DIAGNOSIS — K769 Liver disease, unspecified: Secondary | ICD-10-CM

## 2023-03-17 DIAGNOSIS — M545 Low back pain, unspecified: Secondary | ICD-10-CM

## 2023-03-17 DIAGNOSIS — E1149 Type 2 diabetes mellitus with other diabetic neurological complication: Secondary | ICD-10-CM

## 2023-03-17 DIAGNOSIS — F439 Reaction to severe stress, unspecified: Secondary | ICD-10-CM | POA: Diagnosis not present

## 2023-03-17 DIAGNOSIS — K7460 Unspecified cirrhosis of liver: Secondary | ICD-10-CM | POA: Diagnosis not present

## 2023-03-17 DIAGNOSIS — K5900 Constipation, unspecified: Secondary | ICD-10-CM

## 2023-03-17 DIAGNOSIS — K219 Gastro-esophageal reflux disease without esophagitis: Secondary | ICD-10-CM

## 2023-03-17 DIAGNOSIS — D649 Anemia, unspecified: Secondary | ICD-10-CM

## 2023-03-17 DIAGNOSIS — N529 Male erectile dysfunction, unspecified: Secondary | ICD-10-CM | POA: Insufficient documentation

## 2023-03-17 DIAGNOSIS — I700 Atherosclerosis of aorta: Secondary | ICD-10-CM

## 2023-03-17 DIAGNOSIS — E7800 Pure hypercholesterolemia, unspecified: Secondary | ICD-10-CM

## 2023-03-17 DIAGNOSIS — Z860100 Personal history of colon polyps, unspecified: Secondary | ICD-10-CM

## 2023-03-17 MED ORDER — ATORVASTATIN CALCIUM 40 MG PO TABS: 40.0000 mg | ORAL_TABLET | Freq: Every day | ORAL | 3 refills | Status: DC

## 2023-03-17 MED ORDER — ALLOPURINOL 300 MG PO TABS: 300.0000 mg | ORAL_TABLET | Freq: Every day | ORAL | 1 refills | Status: DC

## 2023-03-17 MED ORDER — FREESTYLE LIBRE 3 SENSOR MISC: 5 refills | Status: DC

## 2023-03-17 MED ORDER — TRIAMTERENE-HCTZ 37.5-25 MG PO TABS: 1.0000 | ORAL_TABLET | Freq: Every day | ORAL | 1 refills | Status: DC

## 2023-03-17 NOTE — Progress Notes (Signed)
Subjective:    [name], male    DOB: 08/07/1971, 52 y.o.   MRN: [id number]  Patient here for  Chief Complaint  Patient presents with   Medical Management of Chronic Issues    HPI Here to follow up regarding diabetes, hypertension and hypercholesterolemia.  Also found to have changes c/w cirrhosis on ultrasound. Recommended MRI 06/2023 - f/u liver lesion.  Declined genetic testing.  F/u colonoscopy 11/2023.  EGD 02/2025.  Recommended starting iron supplements.  Is on ozempic .25mg  - did not tolerate the .5mg  dose.  Also on tresiba - instructed to decrease to 18 units recently.  Has not decreased yet. Plans to decrease given some low sugar previously.  Discussed importance of eating regularly and not going long periods without eating.  Notify if persistent low sugars.  Is not taking iron.  Some increased burping.  Still issues with constipation.  On narcotic medication and has been an issue for him for a while.  No chest pain reported.  Breathing stable.  Some issues with erectile dysfunction.  Discussed medical history and medications.  Request referral to urology.    Past Medical History:  Diagnosis Date   Allergy    Anemia    Aortic atherosclerosis (HCC)    Chronic back pain    Chronic back pain    Cirrhosis of liver (HCC)    Complication of anesthesia    hard time to wake up x1   COVID-19    DM (diabetes mellitus), type 2 (HCC)    GERD (gastroesophageal reflux disease)    Gout    Hypercholesterolemia    Hypertension    Liver lesion    Migraines    Obesity    Sleep apnea    Subarachnoid hemorrhage (HCC) 11/2020   no surgery   Thrombocytopenia (HCC)    TIA (transient ischemic attack)    Past Surgical History:  Procedure Laterality Date   BACK SURGERY  08/12/2014   multiple   BOTOX INJECTION N/A 11/04/2022   Procedure: BOTOX INJECTION;  Surgeon: Leafy Ro, MD;  Location: [facility] ORS;  Service: General;  Laterality: N/A;   CHOLECYSTECTOMY      COLONOSCOPY WITH PROPOFOL N/A 03/11/2022   Procedure: COLONOSCOPY WITH PROPOFOL;  Surgeon: Wyline Mood, MD;  Location: [facility] ENDOSCOPY;  Service: Gastroenterology;  Laterality: N/A;   COLONOSCOPY WITH PROPOFOL N/A 11/17/2022   Procedure: COLONOSCOPY WITH PROPOFOL;  Surgeon: Wyline Mood, MD;  Location: [facility] ENDOSCOPY;  Service: Gastroenterology;  Laterality: N/A;   COLONOSCOPY WITH PROPOFOL N/A 11/24/2022   Procedure: COLONOSCOPY WITH PROPOFOL;  Surgeon: Wyline Mood, MD;  Location: [facility] ENDOSCOPY;  Service: Gastroenterology;  Laterality: N/A;   ESOPHAGOGASTRODUODENOSCOPY N/A 03/11/2022   Procedure: ESOPHAGOGASTRODUODENOSCOPY (EGD);  Surgeon: Wyline Mood, MD;  Location: [facility] ENDOSCOPY;  Service: Gastroenterology;  Laterality: N/A;   KNEE ARTHROSCOPY Left    Dr Monica Martinez   SPHINCTEROTOMY N/A 11/04/2022   Procedure: SPHINCTEROTOMY;  Surgeon: Leafy Ro, MD;  Location: [facility] ORS;  Service: General;  Laterality: N/A;   Family History  Problem Relation Age of Onset   Cancer Father        germ cell (retroperitoneal)   Coronary artery disease Other        s/p stent (age 73)   Hypertension Paternal Grandfather    Kidney cancer Paternal Grandfather    Diabetes Paternal Grandfather    Diabetes Maternal Grandfather    Heart disease Cousin        MI - age 69  Social History   Socioeconomic History   Marital status: Married    Spouse name: Not on file   Number of children: 2   Years of education: 12   Highest education level: Not on file  Occupational History   Occupation: disabled    Employer: [employer]  Tobacco Use   Smoking status: Never   Smokeless tobacco: Never  Vaping Use   Vaping Use: Never used  Substance and Sexual Activity   Alcohol use: No    Alcohol/week: 0.0 standard drinks of alcohol   Drug use: No   Sexual activity: Not on file  Other Topics Concern   Not on file  Social History Narrative   Lives with girlfriend in a one story home.  Has 2 sons.  Trying to get  disability due to several back issues.  Education: high school.    Social Determinants of Health   Financial Resource Strain: Low Risk  (02/28/2023)   Overall Financial Resource Strain (CARDIA)    Difficulty of Paying Living Expenses: Not hard at all  Food Insecurity: No Food Insecurity (02/28/2023)   Hunger Vital Sign    Worried About Running Out of Food in the Last Year: Never true    Ran Out of Food in the Last Year: Never true  Transportation Needs: No Transportation Needs (02/28/2023)   PRAPARE - Administrator, Civil Service (Medical): No    Lack of Transportation (Non-Medical): No  Physical Activity: Not on file  Stress: No Stress Concern Present (02/28/2023)   [employer] of Occupational Health - Occupational Stress Questionnaire    Feeling of Stress : Not at all  Social Connections: Unknown (02/28/2023)   Social Connection and Isolation Panel [NHANES]    Frequency of Communication with Friends and Family: Not on file    Frequency of Social Gatherings with Friends and Family: Not on file    Attends Religious Services: Not on file    Active Member of Clubs or Organizations: Not on file    Attends Banker Meetings: Not on file    Marital Status: Married     Review of Systems  Constitutional:  Negative for appetite change and unexpected weight change.  HENT:  Negative for congestion and sinus pressure.   Respiratory:  Negative for cough, chest tightness and shortness of breath.   Cardiovascular:  Negative for chest pain and palpitations.  Gastrointestinal:  Positive for constipation. Negative for vomiting.       Some increased burping.   Genitourinary:  Negative for difficulty urinating and dysuria.  Musculoskeletal:  Negative for joint swelling and myalgias.       Chronic back pain.   Skin:  Negative for color change and rash.  Neurological:  Negative for dizziness and headaches.  Psychiatric/Behavioral:  Negative for agitation and dysphoric  mood.        Objective:     BP 114/70   Pulse 83   Temp 98.3 F (36.8 C)   Resp 16   Ht 5\' 8"  (1.727 m)   Wt 215 lb 9.6 oz (97.8 kg)   SpO2 98%   BMI 32.78 kg/m  Wt Readings from Last 3 Encounters:  03/17/23 215 lb 9.6 oz (97.8 kg)  02/28/23 215 lb (97.5 kg)  02/03/23 215 lb 12.8 oz (97.9 kg)    Physical Exam Vitals reviewed.  Constitutional:      General: He is not in acute distress.    Appearance: Normal appearance. He is well-developed.  HENT:     Head: Normocephalic and atraumatic.     Right Ear: External ear normal.     Left Ear: External ear normal.  Eyes:     General: No scleral icterus.       Right eye: No discharge.        Left eye: No discharge.     Conjunctiva/sclera: Conjunctivae normal.  Cardiovascular:     Rate and Rhythm: Normal rate and regular rhythm.  Pulmonary:     Effort: Pulmonary effort is normal. No respiratory distress.     Breath sounds: Normal breath sounds.  Abdominal:     General: Bowel sounds are normal.     Palpations: Abdomen is soft.     Tenderness: There is no abdominal tenderness.  Musculoskeletal:        General: No swelling or tenderness.     Cervical back: Neck supple. No tenderness.  Lymphadenopathy:     Cervical: No cervical adenopathy.  Skin:    Findings: No erythema or rash.  Neurological:     Mental Status: He is alert.  Psychiatric:        Mood and Affect: Mood normal.        Behavior: Behavior normal.      Outpatient Encounter Medications as of 03/17/2023  Medication Sig   diclofenac (VOLTAREN) 75 MG EC tablet Take 75 mg by mouth 2 (two) times daily.   allopurinol (ZYLOPRIM) 300 MG tablet Take 1 tablet (300 mg total) by mouth daily.   atorvastatin (LIPITOR) 40 MG tablet Take 1 tablet (40 mg total) by mouth daily.   bisacodyl (DULCOLAX) 5 MG EC tablet Take 5 mg by mouth daily as needed for moderate constipation.   Continuous Glucose Sensor (FREESTYLE LIBRE 3 SENSOR) MISC Place 1 sensor on the skin every 14  days. Use to check glucose continuously   ferrous sulfate (FERROUSUL) 325 (65 FE) MG tablet Take 1 tablet (325 mg total) by mouth every other day.   insulin degludec (TRESIBA FLEXTOUCH) 200 UNIT/ML FlexTouch Pen Inject 20 Units into the skin daily.   Insulin Pen Needle (PEN NEEDLES) 32G X 4 MM MISC Use to inject medication daily   lactulose (CHRONULAC) 10 GM/15ML solution Take 30 mLs (20 g total) by mouth in the morning, at noon, and at bedtime.   metaxalone (SKELAXIN) 800 MG tablet Take 800 mg by mouth 3 (three) times daily.   metFORMIN (GLUCOPHAGE-XR) 500 MG 24 hr tablet Take 1 tablet (500 mg total) by mouth daily with breakfast.   metoprolol succinate (TOPROL-XL) 50 MG 24 hr tablet TAKE 2 TABLETS BY MOUTH DAILY WITH OR IMMEDIATELY FOLLOWING A MEAL.   oxyCODONE (ROXICODONE) 15 MG immediate release tablet Take 15 mg by mouth every 4 (four) hours.   pantoprazole (PROTONIX) 40 MG tablet TAKE 1 TABLET (40 MG TOTAL) BY MOUTH TWICE A DAY BEFORE MEALS   Semaglutide,0.25 or 0.5MG /DOS, (OZEMPIC, 0.25 OR 0.5 MG/DOSE,) 2 MG/3ML SOPN Inject 0.5 mg into the skin once a week.   triamterene-hydrochlorothiazide (MAXZIDE-25) 37.5-25 MG tablet Take 1 tablet by mouth daily.   [DISCONTINUED] allopurinol (ZYLOPRIM) 300 MG tablet TAKE 1 TABLET BY MOUTH EVERY DAY   [DISCONTINUED] atorvastatin (LIPITOR) 40 MG tablet Take 1 tablet (40 mg total) by mouth daily.   [DISCONTINUED] Continuous Blood Gluc Sensor (FREESTYLE LIBRE 3 SENSOR) MISC Place 1 sensor on the skin every 14 days. Use to check glucose continuously   [DISCONTINUED] triamterene-hydrochlorothiazide (MAXZIDE-25) 37.5-25 MG tablet TAKE 1 TABLET BY MOUTH EVERY DAY  No facility-administered encounter medications on file as of 03/17/2023.     Lab Results  Component Value Date   WBC 7.5 12/31/2022   HGB 12.7 (L) 12/31/2022   HCT 38.4 (L) 12/31/2022   PLT 135.0 (L) 12/31/2022   GLUCOSE 118 (H) 02/03/2023   CHOL 130 12/31/2022   TRIG 144.0 12/31/2022   HDL  48.00 12/31/2022   LDLDIRECT 53.0 09/08/2022   LDLCALC 53 12/31/2022   ALT 26 02/03/2023   AST 29 02/03/2023   NA 137 02/03/2023   K 4.6 02/03/2023   CL 100 02/03/2023   CREATININE 1.04 02/03/2023   BUN 10 02/03/2023   CO2 23 02/03/2023   TSH 3.18 09/08/2022   PSA 0.43 01/04/2018   INR 1.1 02/03/2023   HGBA1C 9.1 (H) 12/31/2022   MICROALBUR 0.9 06/10/2022    MR LIVER W WO CONTRAST  Result Date: 12/21/2022 CLINICAL DATA:  52 year old male with history of cirrhosis and indeterminate liver lesion noted on prior examination. Follow-up study. EXAM: MRI ABDOMEN WITHOUT AND WITH CONTRAST TECHNIQUE: Multiplanar multisequence MR imaging of the abdomen was performed both before and after the administration of intravenous contrast. CONTRAST:  10mL GADAVIST GADOBUTROL 1 MMOL/ML IV SOLN COMPARISON:  Abdominal MRI 06/15/2022. FINDINGS: Lower chest: Unremarkable. Hepatobiliary: Diffuse loss of signal intensity throughout the hepatic parenchyma on out of phase dual echo images, indicative of a background of hepatic steatosis. Liver has a slightly shrunken appearance and nodular contour, indicative of underlying cirrhosis. In segment 6 of the liver there is a well-defined 8 mm T1 hypointense, T2 hyperintense, nonenhancing lesion, compatible with a small simple cyst (no imaging follow-up recommended). 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 This is stable compared to the prior study. No other suspicious appearing hepatic lesions are noted. No intra or extrahepatic biliary ductal dilatation. Status post cholecystectomy. Pancreas: No pancreatic mass. No pancreatic ductal dilatation. No pancreatic or peripancreatic fluid collections or  inflammatory changes. Spleen: Spleen is enlarged measuring 9.7 x 7.3 x 14.2 cm (estimated splenic volume of 503 mL). Adrenals/Urinary Tract: Bilateral kidneys and bilateral adrenal glands are normal in appearance. No hydroureteronephrosis in the visualized portions of the abdomen. Stomach/Bowel: Visualized portions are unremarkable. Vascular/Lymphatic: No aneurysm identified in the visualized abdominal vasculature. Portal vein is patent and normal in caliber measuring 12 mm. No lymphadenopathy noted in the abdomen. Other: No significant volume of ascites noted in the visualized portions of the peritoneal cavity. Musculoskeletal: No aggressive appearing osseous lesions are noted in the visualized portions of the skeleton. IMPRESSION: 1. Morphologic changes in the liver indicative of cirrhosis, with a background of mild hepatic steatosis. No suspicious hepatic lesions are noted on today's examination to suggest the presence of hepatocellular carcinoma. Continued clinical surveillance is recommended. 2. Small hepatic lesions including a tiny benign-appearing cyst and what appears to be a flash fill cavernous hemangioma, as above. No imaging follow-up is recommended. 3. Splenomegaly. Electronically Signed   By: Trudie Reed M.D.   On: 12/21/2022 11:08       Assessment & Plan:  Erectile dysfunction, unspecified erectile dysfunction type Assessment & Plan: Discussed ED and possible contributing factors.  Request referral to urology.   Orders: -     Ambulatory referral to  Urology  Aortic atherosclerosis [facility]) Assessment & Plan: Lipitor.    Orders: -     Atorvastatin Calcium; Take 1 tablet (40 mg total) by mouth daily.  Dispense: 90 tablet; Refill: 3  Type 2 diabetes mellitus with neurological complications (HCC) Assessment & Plan: Have dscussed diet and exercise.  On tresiba.  Discussed decrease to 18 units per day - with recent low sugar.  Review sugar readings over the next couple of weeks.   Check met b and A1c.  Did not tolerate .5mg  ozempic.    Orders: -     FreeStyle Libre 3 Sensor; Place 1 sensor on the skin every 14 days. Use to check glucose continuously  Dispense: 2 each; Refill: 5 -     Basic metabolic panel; Future -     Hemoglobin A1c; Future  Anemia, unspecified type Assessment & Plan: Recent hgb decreased 12.6.  had colonoscopy 02/2022.  Multiple polyps.  Recommended f/u in 6-8 months.  (Dr Tobi Bastos).  Follow cbc.   Orders: -     CBC with Differential/Platelet; Future -     IBC + Ferritin; Future  Chronic low back pain, unspecified back pain laterality, unspecified whether sciatica present Assessment & Plan: Followed by pain clinic.  On oxycodone.  Discussed the need to keep bowel moving.  Discussed bowel regimen.     Cirrhosis of liver without ascites, unspecified hepatic cirrhosis type [facility]) Assessment & Plan: Saw GI (Dr Tobi Bastos) - 01/2023 - recommended MRI of the liver in 07/05/2023 to screen for [facility]. Requires hepatitis B vaccination - states receive first. Requires pneumococcal vaccination. Continue f/u with GI.   Orders: -     Hepatic function panel; Future  Constipation, unspecified constipation type Assessment & Plan: Takes narcotic pain medication. Have discussed bowel regimen - to keep bowels moving.  Saw GI.  Colonoscopy 03/11/22 - Four 2 to 3 mm polyps in the cecum, removed with a cold biopsy forceps. Resected and retrieved. Two 4 to 6 mm polyps in the cecum, removed with a cold snare. Resected and retrieved. Three 5 to 7 mm polyps in the ascending colon, removed with a cold snare. Resected and retrieved. One 15 mm polyp in the ascending colon, removed with mucosal resection. Resected and retrieved. Clip was placed. The examination was otherwise normal on direct and retroflexion views. Mucosal resection was performed. Resection and retrieval were complete. Continue f/u with GI.  Recommended f/u colonoscopy 6-8 months. Overall appears to be doing some better as  outlined.    Gastroesophageal reflux disease, unspecified whether esophagitis present Assessment & Plan: On protonix.  Follow.    Hx of colonic polyps Assessment & Plan: Saw Dr Tobi Bastos 01/2023 - Surveillance colonoscopy in January 2025. Genetic testing for multiple tubular adenomas seen on recent colonoscopy in April 2023 he is not interested   Hypercholesterolemia Assessment & Plan: Was on lipitor. Low cholesterol diet and exercise.  Follow lipid panel and liver function tests.  Statin stopped while pursuing liver w/up.  Follow lipid panel.   Orders: -     Lipid panel; Future  Primary hypertension Assessment & Plan: Blood pressure doing well.  Continue triam/hctz and metoprolol.  Follow pressures.  Follow metabolic panel.    Liver lesion Assessment & Plan: Saw GI (Dr Tobi Bastos) - 01/2023 - recommended MRI of the liver in 07/05/2023 to screen for [facility]   Thrombocytopenia [facility]) Assessment & Plan: Low platelet count - feel related to his underlying liver issues.  Follow cbc.     Stress Assessment &  Plan: Overall appears to be handling things relatively well.  Follow.    Other orders -     Allopurinol; Take 1 tablet (300 mg total) by mouth daily.  Dispense: 90 tablet; Refill: 1 -     Triamterene-HCTZ; Take 1 tablet by mouth daily.  Dispense: 90 tablet; Refill: 1     Dale Chesterhill, MD

## 2023-03-20 ENCOUNTER — Encounter: Payer: Self-pay | Admitting: Internal Medicine

## 2023-03-20 NOTE — Assessment & Plan Note (Signed)
Saw Dr Tobi Bastos 01/2023 - Surveillance colonoscopy in January 2025. Genetic testing for multiple tubular adenomas seen on recent colonoscopy in April 2023 he is not interested

## 2023-03-20 NOTE — Assessment & Plan Note (Signed)
On protonix.  Follow.   

## 2023-03-20 NOTE — Assessment & Plan Note (Signed)
Saw GI (Dr Tobi Bastos) - 01/2023 - recommended MRI of the liver in 07/05/2023 to screen for [facility]. Requires hepatitis B vaccination - states receive first. Requires pneumococcal vaccination. Continue f/u with GI.

## 2023-03-20 NOTE — Assessment & Plan Note (Signed)
Discussed ED and possible contributing factors.  Request referral to urology.

## 2023-03-20 NOTE — Assessment & Plan Note (Signed)
Followed by pain clinic.  On oxycodone.  Discussed the need to keep bowel moving.  Discussed bowel regimen.   ?

## 2023-03-20 NOTE — Assessment & Plan Note (Signed)
Have dscussed diet and exercise.  On tresiba.  Discussed decrease to 18 units per day - with recent low sugar.  Review sugar readings over the next couple of weeks.  Check met b and A1c.  Did not tolerate .5mg  ozempic.

## 2023-03-20 NOTE — Assessment & Plan Note (Signed)
Recent hgb decreased 12.6.  had colonoscopy 02/2022.  Multiple polyps.  Recommended f/u in 6-8 months.  (Dr Anna).  Follow cbc.  

## 2023-03-20 NOTE — Assessment & Plan Note (Signed)
Low platelet count - feel related to his underlying liver issues.  Follow cbc.   

## 2023-03-20 NOTE — Assessment & Plan Note (Signed)
Saw GI (Dr Tobi Bastos) - 01/2023 - recommended MRI of the liver in 07/05/2023 to screen for [facility]

## 2023-03-20 NOTE — Assessment & Plan Note (Signed)
Overall appears to be handling things relatively well.  Follow.   

## 2023-03-20 NOTE — Assessment & Plan Note (Signed)
Takes narcotic pain medication. Have discussed bowel regimen - to keep bowels moving.  Saw GI.  Colonoscopy 03/11/22 - Four 2 to 3 mm polyps in the cecum, removed with a cold biopsy forceps. Resected and retrieved. Two 4 to 6 mm polyps in the cecum, removed with a cold snare. Resected and retrieved. Three 5 to 7 mm polyps in the ascending colon, removed with a cold snare. Resected and retrieved. One 15 mm polyp in the ascending colon, removed with mucosal resection. Resected and retrieved. Clip was placed. The examination was otherwise normal on direct and retroflexion views. Mucosal resection was performed. Resection and retrieval were complete. Continue f/u with GI.  Recommended f/u colonoscopy 6-8 months. Overall appears to be doing some better as outlined.  

## 2023-03-20 NOTE — Assessment & Plan Note (Signed)
Blood pressure doing well.  Continue triam/hctz and metoprolol.  Follow pressures.  Follow metabolic panel.  

## 2023-03-20 NOTE — Assessment & Plan Note (Signed)
Was on lipitor. Low cholesterol diet and exercise.  Follow lipid panel and liver function tests.  Statin stopped while pursuing liver w/up.  Follow lipid panel.

## 2023-03-20 NOTE — Assessment & Plan Note (Signed)
Lipitor 

## 2023-03-23 ENCOUNTER — Telehealth: Payer: Self-pay

## 2023-03-23 ENCOUNTER — Other Ambulatory Visit (INDEPENDENT_AMBULATORY_CARE_PROVIDER_SITE_OTHER): Payer: Medicare HMO

## 2023-03-23 DIAGNOSIS — D509 Iron deficiency anemia, unspecified: Secondary | ICD-10-CM | POA: Diagnosis not present

## 2023-03-23 NOTE — Telephone Encounter (Signed)
Patient called back and he agreed on having his capsule study on 04/25/2023 at [facility]. I let him know that I would be sending him his instructions on what he is needing to do. Patient agreed and had no further questions.

## 2023-03-23 NOTE — Telephone Encounter (Signed)
Called patient back and had to leave him a voicemail to call me back so we could reschedule his capsule study.

## 2023-03-23 NOTE — Telephone Encounter (Signed)
Received 5 boxes of patient assistance ozempic. Need to clarify dose he is taking prior to labeling. Last note from Dr Lorin Picket says did not tolerate 0.5mg  dose. LM for patient to call back. Meds placed in refrigerator until patient returns call.

## 2023-03-29 ENCOUNTER — Ambulatory Visit: Payer: Medicare HMO | Admitting: Nurse Practitioner

## 2023-03-29 DIAGNOSIS — Z794 Long term (current) use of insulin: Secondary | ICD-10-CM

## 2023-04-07 DIAGNOSIS — M545 Low back pain, unspecified: Secondary | ICD-10-CM | POA: Diagnosis not present

## 2023-04-07 DIAGNOSIS — Z79891 Long term (current) use of opiate analgesic: Secondary | ICD-10-CM | POA: Diagnosis not present

## 2023-04-07 DIAGNOSIS — M5137 Other intervertebral disc degeneration, lumbosacral region: Secondary | ICD-10-CM | POA: Diagnosis not present

## 2023-04-07 DIAGNOSIS — Z79899 Other long term (current) drug therapy: Secondary | ICD-10-CM | POA: Diagnosis not present

## 2023-04-07 DIAGNOSIS — G894 Chronic pain syndrome: Secondary | ICD-10-CM | POA: Diagnosis not present

## 2023-04-07 DIAGNOSIS — M961 Postlaminectomy syndrome, not elsewhere classified: Secondary | ICD-10-CM | POA: Diagnosis not present

## 2023-04-08 ENCOUNTER — Other Ambulatory Visit (INDEPENDENT_AMBULATORY_CARE_PROVIDER_SITE_OTHER): Payer: Medicare HMO

## 2023-04-08 DIAGNOSIS — K7460 Unspecified cirrhosis of liver: Secondary | ICD-10-CM

## 2023-04-08 DIAGNOSIS — E1149 Type 2 diabetes mellitus with other diabetic neurological complication: Secondary | ICD-10-CM | POA: Diagnosis not present

## 2023-04-08 DIAGNOSIS — D649 Anemia, unspecified: Secondary | ICD-10-CM | POA: Diagnosis not present

## 2023-04-08 DIAGNOSIS — E7800 Pure hypercholesterolemia, unspecified: Secondary | ICD-10-CM | POA: Diagnosis not present

## 2023-04-08 LAB — CBC WITH DIFFERENTIAL/PLATELET
Basophils Absolute: 0 10*3/uL (ref 0.0–0.1)
Basophils Relative: 0.5 % (ref 0.0–3.0)
Eosinophils Absolute: 0.1 10*3/uL (ref 0.0–0.7)
Eosinophils Relative: 1.6 % (ref 0.0–5.0)
HCT: 39.4 % (ref 39.0–52.0)
Hemoglobin: 12.8 g/dL — ABNORMAL LOW (ref 13.0–17.0)
Lymphocytes Relative: 32.8 % (ref 12.0–46.0)
Lymphs Abs: 2.5 10*3/uL (ref 0.7–4.0)
MCHC: 32.5 g/dL (ref 30.0–36.0)
MCV: 82.9 fl (ref 78.0–100.0)
Monocytes Absolute: 0.7 10*3/uL (ref 0.1–1.0)
Monocytes Relative: 8.6 % (ref 3.0–12.0)
Neutro Abs: 4.3 10*3/uL (ref 1.4–7.7)
Neutrophils Relative %: 56.5 % (ref 43.0–77.0)
Platelets: 132 10*3/uL — ABNORMAL LOW (ref 150.0–400.0)
RBC: 4.76 Mil/uL (ref 4.22–5.81)
RDW: 17.2 % — ABNORMAL HIGH (ref 11.5–15.5)
WBC: 7.7 10*3/uL (ref 4.0–10.5)

## 2023-04-08 LAB — IBC + FERRITIN
Ferritin: 11.8 ng/mL — ABNORMAL LOW (ref 22.0–322.0)
Iron: 55 ug/dL (ref 42–165)
Saturation Ratios: 12.7 % — ABNORMAL LOW (ref 20.0–50.0)
TIBC: 432.6 ug/dL (ref 250.0–450.0)
Transferrin: 309 mg/dL (ref 212.0–360.0)

## 2023-04-08 LAB — HEMOGLOBIN A1C: Hgb A1c MFr Bld: 7.5 % — ABNORMAL HIGH (ref 4.6–6.5)

## 2023-04-08 LAB — HEPATIC FUNCTION PANEL
ALT: 21 U/L (ref 0–53)
AST: 26 U/L (ref 0–37)
Albumin: 4 g/dL (ref 3.5–5.2)
Alkaline Phosphatase: 58 U/L (ref 39–117)
Bilirubin, Direct: 0.1 mg/dL (ref 0.0–0.3)
Total Bilirubin: 0.6 mg/dL (ref 0.2–1.2)
Total Protein: 6.6 g/dL (ref 6.0–8.3)

## 2023-04-08 LAB — BASIC METABOLIC PANEL
BUN: 10 mg/dL (ref 6–23)
CO2: 24 mEq/L (ref 19–32)
Calcium: 9.2 mg/dL (ref 8.4–10.5)
Chloride: 102 mEq/L (ref 96–112)
Creatinine, Ser: 0.9 mg/dL (ref 0.40–1.50)
GFR: 97.23 mL/min (ref >60.00)
Glucose, Bld: 181 mg/dL — ABNORMAL HIGH (ref 70–99)
Potassium: 4.1 mEq/L (ref 3.5–5.1)
Sodium: 136 mEq/L (ref 135–145)

## 2023-04-08 LAB — LIPID PANEL
Cholesterol: 119 mg/dL (ref 0–200)
HDL: 40.3 mg/dL (ref >39.00)
LDL Cholesterol: 42 mg/dL (ref 0–99)
NonHDL: 78.29
Total CHOL/HDL Ratio: 3
Triglycerides: 179 mg/dL — ABNORMAL HIGH (ref 0.0–149.0)
VLDL: 35.8 mg/dL (ref 0.0–40.0)

## 2023-04-13 ENCOUNTER — Telehealth: Payer: Self-pay

## 2023-04-13 NOTE — Telephone Encounter (Signed)
-----   Message from Dale , MD sent at 04/09/2023 11:46 AM EDT ----- Notify - iron stores remain low.  Confirm he is still taking iron supplement - ferrous sulfate 325mg  q day.  We will continue to follow.  Is seeing Dr Tobi Bastos and had w/up and is being followed for iron deficient anemia.  Hgb stable.  Platelet count stable.  Still decreased but stable. Cholesterol levels are ok.  Triglycerides are slightly elevated.  A1c has improved = 7.5.  good job! Continue current medications and low carb diet and exercise.  We will follow.  Kidney function tests and liver function tests are within normal limits.

## 2023-04-15 NOTE — Telephone Encounter (Signed)
Please follow-up with patient on medication (sample fridge is full)

## 2023-04-20 ENCOUNTER — Ambulatory Visit: Payer: Medicare HMO | Admitting: Urology

## 2023-04-20 ENCOUNTER — Encounter: Payer: Self-pay | Admitting: Urology

## 2023-04-20 VITALS — BP 117/72 | HR 64 | Ht 68.0 in | Wt 217.2 lb

## 2023-04-20 DIAGNOSIS — N528 Other male erectile dysfunction: Secondary | ICD-10-CM

## 2023-04-20 MED ORDER — SILDENAFIL CITRATE 20 MG PO TABS
ORAL_TABLET | ORAL | 11 refills | Status: AC
Start: 2023-04-20 — End: ?

## 2023-04-20 NOTE — Telephone Encounter (Signed)
Spoke with patient. Says he has been doing 0.25 mg sometimes and then sometimes 0.5mg . Advised per our records- should be on 0.5 mg. Pt assistance meds labeled and ready for pick up. Patient says he will come pick up

## 2023-04-20 NOTE — Progress Notes (Signed)
Marcelle Overlie Plume,acting as a scribe for Vanna Scotland, MD.,have documented all relevant documentation on the behalf of Vanna Scotland, MD,as directed by  Vanna Scotland, MD while in the presence of Vanna Scotland, MD.  04/20/2023 5:25 PM   Cameron Sanchez 10/14/1971 [id number]  Referring provider: Dale Wayne Lakes, MD [address] [place],  [place] [address]  Chief Complaint  Patient presents with   Establish Care   Erectile Dysfunction    HPI: 52 year-old male who presents today with complaints of a 2 year history of erectile dysfunction. He reports difficulty both achieving and maintaining erections. He occasionally experiences morning erections, typically when he needs to urinate. He attributes his ED to his medications, including pain pills and muscle relaxers, as well as his diabetes. He is unsure about his testosterone levels but denies symptoms of low testosterone, such as decreased libido.   PMH: Past Medical History:  Diagnosis Date   Allergy    Anemia    Aortic atherosclerosis (HCC)    Chronic back pain    Chronic back pain    Cirrhosis of liver (HCC)    Complication of anesthesia    hard time to wake up x1   COVID-19    DM (diabetes mellitus), type 2 (HCC)    GERD (gastroesophageal reflux disease)    Gout    Hypercholesterolemia    Hypertension    Liver lesion    Migraines    Obesity    Sleep apnea    Subarachnoid hemorrhage (HCC) 11/2020   no surgery   Thrombocytopenia (HCC)    TIA (transient ischemic attack)     Surgical History: Past Surgical History:  Procedure Laterality Date   BACK SURGERY  08/12/2014   multiple   BOTOX INJECTION N/A 11/04/2022   Procedure: BOTOX INJECTION;  Surgeon: Leafy Ro, MD;  Location: [facility] ORS;  Service: General;  Laterality: N/A;   CHOLECYSTECTOMY     COLONOSCOPY WITH PROPOFOL N/A 03/11/2022   Procedure: COLONOSCOPY WITH PROPOFOL;  Surgeon: Wyline Mood, MD;  Location: [facility] ENDOSCOPY;  Service:  Gastroenterology;  Laterality: N/A;   COLONOSCOPY WITH PROPOFOL N/A 11/17/2022   Procedure: COLONOSCOPY WITH PROPOFOL;  Surgeon: Wyline Mood, MD;  Location: [facility] ENDOSCOPY;  Service: Gastroenterology;  Laterality: N/A;   COLONOSCOPY WITH PROPOFOL N/A 11/24/2022   Procedure: COLONOSCOPY WITH PROPOFOL;  Surgeon: Wyline Mood, MD;  Location: [facility] ENDOSCOPY;  Service: Gastroenterology;  Laterality: N/A;   ESOPHAGOGASTRODUODENOSCOPY N/A 03/11/2022   Procedure: ESOPHAGOGASTRODUODENOSCOPY (EGD);  Surgeon: Wyline Mood, MD;  Location: [facility] ENDOSCOPY;  Service: Gastroenterology;  Laterality: N/A;   KNEE ARTHROSCOPY Left    Dr Monica Martinez   SPHINCTEROTOMY N/A 11/04/2022   Procedure: SPHINCTEROTOMY;  Surgeon: Leafy Ro, MD;  Location: [facility] ORS;  Service: General;  Laterality: N/A;    Home Medications:  Allergies as of 04/20/2023   No Known Allergies      Medication List        Accurate as of April 20, 2023  5:25 PM. If you have any questions, ask your nurse or doctor.          allopurinol 300 MG tablet Commonly known as: ZYLOPRIM Take 1 tablet (300 mg total) by mouth daily.   atorvastatin 40 MG tablet Commonly known as: LIPITOR Take 1 tablet (40 mg total) by mouth daily.   bisacodyl 5 MG EC tablet Commonly known as: DULCOLAX Take 5 mg by mouth daily as needed for moderate constipation.   diclofenac 75 MG EC tablet  Commonly known as: VOLTAREN Take 75 mg by mouth 2 (two) times daily.   ferrous sulfate 325 (65 FE) MG tablet Commonly known as: FerrouSul Take 1 tablet (325 mg total) by mouth every other day.   FreeStyle Libre 3 Sensor Misc Place 1 sensor on the skin every 14 days. Use to check glucose continuously   lactulose 10 GM/15ML solution Commonly known as: CHRONULAC Take 30 mLs (20 g total) by mouth in the morning, at noon, and at bedtime.   metaxalone 800 MG tablet Commonly known as: SKELAXIN Take 800 mg by mouth 3 (three) times daily.   metFORMIN 500 MG 24 hr  tablet Commonly known as: GLUCOPHAGE-XR Take 1 tablet (500 mg total) by mouth daily with breakfast.   metoprolol succinate 50 MG 24 hr tablet Commonly known as: TOPROL-XL TAKE 2 TABLETS BY MOUTH DAILY WITH OR IMMEDIATELY FOLLOWING A MEAL.   oxyCODONE 15 MG immediate release tablet Commonly known as: ROXICODONE Take 15 mg by mouth every 4 (four) hours.   Ozempic (0.25 or 0.5 MG/DOSE) 2 MG/3ML Sopn Generic drug: Semaglutide(0.25 or 0.5MG /DOS) Inject 0.5 mg into the skin once a week.   pantoprazole 40 MG tablet Commonly known as: PROTONIX TAKE 1 TABLET (40 MG TOTAL) BY MOUTH TWICE A DAY BEFORE MEALS   Pen Needles 32G X 4 MM Misc Use to inject medication daily   sildenafil 20 MG tablet Commonly known as: REVATIO Take 1-5 tablets 1 hour prior to intercourse as needed Started by: Vanna Scotland, MD   [name] FlexTouch 200 UNIT/ML FlexTouch Pen Generic drug: insulin degludec Inject 20 Units into the skin daily.   triamterene-hydrochlorothiazide 37.5-25 MG tablet Commonly known as: MAXZIDE-25 Take 1 tablet by mouth daily.        Family History: Family History  Problem Relation Age of Onset   Cancer Father        germ cell (retroperitoneal)   Coronary artery disease Other        s/p stent (age 75)   Hypertension Paternal Grandfather    Kidney cancer Paternal Grandfather    Diabetes Paternal Grandfather    Diabetes Maternal Grandfather    Heart disease Cousin        MI - age 63    Social History:  reports that he has never smoked. He has never used smokeless tobacco. He reports that he does not drink alcohol and does not use drugs.   Physical Exam: BP 117/72   Pulse 64   Ht 5\' 8"  (1.727 m)   Wt 217 lb 4 oz (98.5 kg)   BMI 33.03 kg/m   Constitutional:  Alert and oriented, No acute distress. HEENT: [place] AT, moist mucus membranes.  Trachea midline, no masses. Neurologic: Grossly intact, no focal deficits, moving all 4 extremities. Psychiatric: Normal mood and  affect.  Assessment & Plan:    1. Erectile dysfunction - We discussed the pathophysiology of erectile dysfunction today along with possible contributing factors. Discussed possible treatment options including PDE 5 inhibitors, vacuum erectile device, intracavernosal injection, MUSE, and placement of the inflatable or malleable penile prosthesis for refractory cases.  In terms of PDE 5 inhibitors, we discussed contraindications for this medication as well as common side effects. Patient was counseled on optimal use. All of his questions were answered in detail.  - Likely secondary to vascular issues related to diabetes and medication use. - Start with generic Sildenafil 20 mg, take 1-5 pills as needed one hour before sexual activity. - Advise him to take  on an empty stomach for better absorption if higher doses are needed. - Follow up via MyChart if no improvement, consider switching to Cialis or other treatment options.  Return if symptoms worsen or fail to improve.  [facility] [address], [address] [place], [place] [address] [phone]

## 2023-04-25 ENCOUNTER — Encounter: Admission: RE | Payer: Self-pay | Source: Home / Self Care

## 2023-04-25 ENCOUNTER — Telehealth: Payer: Self-pay | Admitting: Internal Medicine

## 2023-04-25 ENCOUNTER — Ambulatory Visit: Admission: RE | Admit: 2023-04-25 | Payer: Medicare HMO | Source: Home / Self Care | Admitting: Gastroenterology

## 2023-04-25 SURGERY — IMAGING PROCEDURE, GI TRACT, INTRALUMINAL, VIA CAPSULE

## 2023-04-25 NOTE — Telephone Encounter (Signed)
Prescription Request  04/25/2023  LOV: 03/17/2023  What is the name of the medication or equipment? Libre 3  Have you contacted your pharmacy to request a refill? Yes   Which pharmacy would you like this sent to?  [id number] [place] [place] - [address] [place] [address] Phone: [phone] Fax: [phone]     Patient notified that their request is being sent to the clinical staff for review and that they should receive a response within 2 business days.   Please advise at Mobile [phone] (mobile)

## 2023-04-26 ENCOUNTER — Other Ambulatory Visit: Payer: Medicare HMO | Admitting: Pharmacist

## 2023-04-26 NOTE — Progress Notes (Signed)
04/26/2023 Name: Cameron Sanchez MRN: [id number] DOB: 1971-01-09  Chief Complaint  Patient presents with   Medication Management   Diabetes    Cameron Sanchez is a 52 y.o. year old male who presented for a telephone visit.   They were referred to the pharmacist by their PCP for assistance in managing diabetes.    Subjective:  Care Team: Primary Care Provider: Dale Ranger, MD ; Next Scheduled Visit: 06/2023  Medication Access/Adherence  Current Pharmacy:  [id number] [place] [place] - [address] [place] [address] Phone: [phone] Fax: [phone]  [facility] [address], [place] - [address] [place] [address] Phone: [phone] Fax: [phone]   Patient reports affordability concerns with their medications: No  Patient reports access/transportation concerns to their pharmacy: No  Patient reports adherence concerns with their medications:  No    Diabetes:  Current medications: metformin XR 500 mg daily, Ozempic 0.5 mg weekly, [name] 20 units daily - has been holding due to low blood sugars  Reports significant worsening of stomach upset since increasing Ozempic to 0.5 mg weekly. Does also feel that pain medication is contributing to constipation, but was unable to have Capsule study yesterday due to stomach upset. Notes he has been unable to eat several days due to upset stomach.  Patient notes he has run out of Universal 3 and the pharmacy was unable to fill.   Objective:  Lab Results  Component Value Date   HGBA1C 7.5 (H) 04/08/2023    Lab Results  Component Value Date   CREATININE 0.90 04/08/2023   BUN 10 04/08/2023   NA 136 04/08/2023   K 4.1 04/08/2023   CL 102 04/08/2023   CO2 24 04/08/2023    Lab Results  Component Value Date   CHOL 119 04/08/2023   HDL 40.30 04/08/2023   LDLCALC 42 04/08/2023   LDLDIRECT 53.0 09/08/2022   TRIG 179.0 (H) 04/08/2023   CHOLHDL 3 04/08/2023     Medications Reviewed Today     Reviewed by Alden Hipp, RPH-CPP (Pharmacist) on 04/26/23 at 1002  Med List Status: <None>   Medication Order Taking? Sig Documenting Provider Last Dose Status Informant  allopurinol (ZYLOPRIM) 300 MG tablet [id number] Yes Take 1 tablet (300 mg total) by mouth daily. Dale [place], MD Taking Active   atorvastatin (LIPITOR) 40 MG tablet [id number] Yes Take 1 tablet (40 mg total) by mouth daily. Dale [place], MD Taking Active   bisacodyl (DULCOLAX) 5 MG EC tablet [id number] Yes Take 5 mg by mouth daily as needed for moderate constipation. [provider] Taking Active   Continuous Glucose Sensor (FREESTYLE LIBRE 3 SENSOR) [place] [id number] Yes Place 1 sensor on the skin every 14 days. Use to check glucose continuously Dale [place], MD Taking Active   diclofenac (VOLTAREN) 75 MG EC tablet [id number] No Take 75 mg by mouth 2 (two) times daily.  Patient not taking: Reported on 04/26/2023   [provider] Not Taking Active   ferrous sulfate (FERROUSUL) 325 (65 FE) MG tablet [id number] No Take 1 tablet (325 mg total) by mouth every other day.  Patient not taking: Reported on 04/26/2023   Wyline Mood, MD Not Taking Active   insulin degludec (TRESIBA FLEXTOUCH) 200 UNIT/ML FlexTouch Pen [id number] Yes Inject 20 Units into the skin daily. Dale [place], MD Taking Active   Insulin Pen Needle (PEN NEEDLES) 32G X 4 MM MISC [id number]  Use to inject medication  daily Dale [place], MD  Active   lactulose [facility]) 10 GM/15ML solution [id number]  Take 30 mLs (20 g total) by mouth in the morning, at noon, and at bedtime. Wyline Mood, MD  Active   metaxalone [facility]) 800 MG tablet [id number]  Take 800 mg by mouth 3 (three) times daily. [provider]  Active   metFORMIN (GLUCOPHAGE-XR) 500 MG 24 hr tablet [id number] Yes Take 1 tablet (500 mg total) by mouth daily with breakfast. Dale [place], MD Taking Active   metoprolol succinate  (TOPROL-XL) 50 MG 24 hr tablet [id number]  TAKE 2 TABLETS BY MOUTH DAILY WITH OR IMMEDIATELY FOLLOWING A MEAL. Dale [place], MD  Active   oxyCODONE (ROXICODONE) 15 MG immediate release tablet [id number]  Take 15 mg by mouth every 4 (four) hours. [provider]  Active   pantoprazole (PROTONIX) 40 MG tablet [id number] Yes TAKE 1 TABLET (40 MG TOTAL) BY MOUTH TWICE A DAY BEFORE MEALS Dale [place], MD Taking Active   Semaglutide,0.25 or 0.5MG /DOS, (OZEMPIC, 0.25 OR 0.5 MG/DOSE,) 2 MG/3ML SOPN [id number] No Inject 0.5 mg into the skin once a week.  Patient not taking: Reported on 04/26/2023   Dale [place], MD Not Taking Active   sildenafil (REVATIO) 20 MG tablet [id number]  Take 1-5 tablets 1 hour prior to intercourse as needed Vanna Scotland, MD  Active   triamterene-hydrochlorothiazide (MAXZIDE-25) 37.5-25 MG tablet [id number]  Take 1 tablet by mouth daily. Dale [place], MD  Active               Assessment/Plan:   Diabetes: - Currently controlled per A1c but not tolerating medication. - Recommend to hold Ozempic. Continue metformin. Monitor sugars, will follow up next week for need to restart [place].  - Recommend to check glucose continuously using CGM. [facility] Libre [facility] for patient   Follow Up Plan: phone call in 1 week  [name] TClearance Coots, PharmD, BCACP, CPP [facility] [phone]

## 2023-04-26 NOTE — Telephone Encounter (Signed)
Pt should have refills at the pharmacy. LM to let him know.

## 2023-05-03 ENCOUNTER — Other Ambulatory Visit: Payer: Medicare HMO | Admitting: Pharmacist

## 2023-05-03 NOTE — Progress Notes (Signed)
Care Coordination Call  Contacted patient to follow up since holding Ozempic last week. He reports he continues to struggle with constipation and stomach upset. Glucose readings remain relatively well controlled on metformin XR 500 mg twice daily and Tresiba 18 units daily.    Recommend to continue current regimen at this time. Discussed use of senna + docusate laxative combination at least twice daily for opioid induced constipation and focus on hydration with water. Patient verbalized understanding.   Follow up scheduled in 2 weeks.  [name] Eppie Gibson, PharmD, BCACP, CPP Clinical Pharmacist [facility] [phone]

## 2023-05-05 DIAGNOSIS — G894 Chronic pain syndrome: Secondary | ICD-10-CM | POA: Diagnosis not present

## 2023-05-05 DIAGNOSIS — M961 Postlaminectomy syndrome, not elsewhere classified: Secondary | ICD-10-CM | POA: Diagnosis not present

## 2023-05-05 DIAGNOSIS — Z79899 Other long term (current) drug therapy: Secondary | ICD-10-CM | POA: Diagnosis not present

## 2023-05-05 DIAGNOSIS — M5137 Other intervertebral disc degeneration, lumbosacral region: Secondary | ICD-10-CM | POA: Diagnosis not present

## 2023-05-05 DIAGNOSIS — Z79891 Long term (current) use of opiate analgesic: Secondary | ICD-10-CM | POA: Diagnosis not present

## 2023-05-05 DIAGNOSIS — M545 Low back pain, unspecified: Secondary | ICD-10-CM | POA: Diagnosis not present

## 2023-05-18 ENCOUNTER — Other Ambulatory Visit: Payer: Medicare HMO | Admitting: Pharmacist

## 2023-05-18 DIAGNOSIS — E1149 Type 2 diabetes mellitus with other diabetic neurological complication: Secondary | ICD-10-CM

## 2023-05-18 MED ORDER — TRESIBA FLEXTOUCH 200 UNIT/ML ~~LOC~~ SOPN
24.0000 [IU] | PEN_INJECTOR | Freq: Every day | SUBCUTANEOUS | 2 refills | Status: DC
Start: 2023-05-18 — End: 2023-09-28

## 2023-05-18 NOTE — Progress Notes (Signed)
05/18/2023 Name: Cameron Sanchez MRN: [id number] DOB: 09-19-1971  Chief Complaint  Patient presents with   Medication Management   Diabetes    [name] is a 52 y.o. year old male who presented for a telephone visit.   They were referred to the pharmacist by their PCP for assistance in managing diabetes.    Subjective:  Care Team: Primary Care Provider: Dale [place], MD ; Next Scheduled Visit: 06/17/23  Medication Access/Adherence  Current Pharmacy:  [id number] [place] [place] - [address] [place] [address] Phone: [phone] Fax: [phone]  [facility] [address], [place] - [address] [place] [address] Phone: [phone] Fax: [phone]   Patient reports affordability concerns with their medications: No  Patient reports access/transportation concerns to their pharmacy: No Patient reports adherence concerns with their medications:  No    Diabetes:  Current medications: metformin XR 500 mg daily, Ozempic - holding due to constipation; [name] 18 units daily   Date of Download: 6/15-6/28/24 % Time CGM is active: 93% Average Glucose: 205 mg/dL Glucose Management Indicator: 8.2  Glucose Variability: 26.7 (goal <36%) Time in Goal:  - Time in range 70-180: 37% - Time above range: 63% - Time below range: 0% Observed patterns:   Constipation: Current medications: bisacodyl 5 mg three times daily, reports he also takes a stool softener as needed  Reports continued period constipation and period discomfort on the top of his stomach. Has not rescheduled capsule study (canceled due to significant stomach upset that day) but plans to. He notes that he believes this is most related to opioids, but believes the Ozempic was worsening it.   Objective:  Lab Results  Component Value Date   HGBA1C 7.5 (H) 04/08/2023    Lab Results  Component Value Date   CREATININE 0.90 04/08/2023   BUN 10 04/08/2023    NA 136 04/08/2023   K 4.1 04/08/2023   CL 102 04/08/2023   CO2 24 04/08/2023    Lab Results  Component Value Date   CHOL 119 04/08/2023   HDL 40.30 04/08/2023   LDLCALC 42 04/08/2023   LDLDIRECT 53.0 09/08/2022   TRIG 179.0 (H) 04/08/2023   CHOLHDL 3 04/08/2023    Medications Reviewed Today     Reviewed by Cameron Sanchez, RPH-CPP (Pharmacist) on 05/03/23 at 1155  Med List Status: <None>   Medication Order Taking? Sig Documenting Provider Last Dose Status Informant  allopurinol (ZYLOPRIM) 300 MG tablet [id number]  Take 1 tablet (300 mg total) by mouth daily. Cameron [place], MD  Active   atorvastatin (LIPITOR) 40 MG tablet [id number]  Take 1 tablet (40 mg total) by mouth daily. Cameron [place], MD  Active   bisacodyl (DULCOLAX) 5 MG EC tablet [id number]  Take 5 mg by mouth daily as needed for moderate constipation. [provider]  Active   Continuous Glucose Sensor (FREESTYLE LIBRE 3 SENSOR) [place] [id number]  Place 1 sensor on the skin every 14 days. Use to check glucose continuously Cameron Chauncey, MD  Active   diclofenac (VOLTAREN) 75 MG EC tablet [id number]  Take 75 mg by mouth 2 (two) times daily.  Patient not taking: Reported on 04/26/2023   [provider]  Active   ferrous sulfate (FERROUSUL) 325 (65 FE) MG tablet [id number]  Take 1 tablet (325 mg total) by mouth every other day.  Patient not taking: Reported on 04/26/2023   Cameron Mood, MD  Active  insulin degludec (TRESIBA FLEXTOUCH) 200 UNIT/ML FlexTouch Pen [id number] Yes Inject 20 Units into the skin daily. Cameron [place], MD Taking Active   Insulin Pen Needle (PEN NEEDLES) 32G X 4 MM MISC [id number]  Use to inject medication daily Cameron [place], MD  Active   lactulose (CHRONULAC) 10 GM/15ML solution [id number]  Take 30 mLs (20 g total) by mouth in the morning, at noon, and at bedtime. Cameron Mood, MD  Active   metaxalone [facility] 800 MG tablet [id number]  Take 800 mg by mouth 3 (three) times daily.  [provider]  Active   metFORMIN (GLUCOPHAGE-XR) 500 MG 24 hr tablet [id number] Yes Take 1 tablet (500 mg total) by mouth daily with breakfast. Cameron [place], MD Taking Active   metoprolol succinate (TOPROL-XL) 50 MG 24 hr tablet [id number]  TAKE 2 TABLETS BY MOUTH DAILY WITH OR IMMEDIATELY FOLLOWING A MEAL. Cameron [place], MD  Active   oxyCODONE (ROXICODONE) 15 MG immediate release tablet [id number]  Take 15 mg by mouth every 4 (four) hours. [provider]  Active   pantoprazole (PROTONIX) 40 MG tablet [id number]  TAKE 1 TABLET (40 MG TOTAL) BY MOUTH TWICE A DAY BEFORE MEALS Cameron [place], MD  Active   Semaglutide,0.25 or 0.5MG /DOS, (OZEMPIC, 0.25 OR 0.5 MG/DOSE,) 2 MG/3ML SOPN [id number] No Inject 0.5 mg into the skin once a week.  Patient not taking: Reported on 04/26/2023   Cameron [place], MD Not Taking Active   sildenafil (REVATIO) 20 MG tablet [id number]  Take 1-5 tablets 1 hour prior to intercourse as needed Cameron Scotland, MD  Active   triamterene-hydrochlorothiazide (MAXZIDE-25) 37.5-25 MG tablet [id number]  Take 1 tablet by mouth daily. Cameron [place], MD  Active               Assessment/Plan:   Diabetes: - Currently uncontrolled - Reviewed goal A1c, goal fasting, and goal 2 hour post prandial glucose - Recommend to increase [place] to 24 units daily. Continue to hold Ozempic to avoid exacerbation of baseline constipation. Continue metformin at this time - Recommend to check glucose continuously using Libre   Constipation: - Currently uncontrolled - Encouraged to use bisacodyl + docusate scheduled twice daily. If no benefit from that, would encourage follow up with GI and/or consideration of alternative agents for constipation. Appears Movantik is preferred on patient's formulary.   Follow Up Plan: patient will update me on glucose readings next week  [name] Eppie Gibson, PharmD, BCACP, CPP Clinical Pharmacist [facility] [phone]

## 2023-05-18 NOTE — Progress Notes (Signed)
Received note from [name].  Please call Carollee Herter and see if he is agreeable to f/u with Dr Tobi Bastos for his persistent constipation and he is due a f/u colonoscopy.

## 2023-06-02 DIAGNOSIS — Z79899 Other long term (current) drug therapy: Secondary | ICD-10-CM | POA: Diagnosis not present

## 2023-06-02 DIAGNOSIS — M961 Postlaminectomy syndrome, not elsewhere classified: Secondary | ICD-10-CM | POA: Diagnosis not present

## 2023-06-02 DIAGNOSIS — M545 Low back pain, unspecified: Secondary | ICD-10-CM | POA: Diagnosis not present

## 2023-06-02 DIAGNOSIS — M5137 Other intervertebral disc degeneration, lumbosacral region: Secondary | ICD-10-CM | POA: Diagnosis not present

## 2023-06-02 DIAGNOSIS — G894 Chronic pain syndrome: Secondary | ICD-10-CM | POA: Diagnosis not present

## 2023-06-02 DIAGNOSIS — Z79891 Long term (current) use of opiate analgesic: Secondary | ICD-10-CM | POA: Diagnosis not present

## 2023-06-08 ENCOUNTER — Other Ambulatory Visit: Payer: Self-pay | Admitting: Internal Medicine

## 2023-06-16 ENCOUNTER — Telehealth: Payer: Self-pay | Admitting: Internal Medicine

## 2023-06-16 LAB — HM DIABETES EYE EXAM

## 2023-06-16 NOTE — Telephone Encounter (Signed)
Pt came in the office for an appointment but wanted me to send the pharmacist a message about his medication. Pt stated he has not talked to her in awhile. I did let the pt know that all medication is directed towards his provider now .

## 2023-06-16 NOTE — Telephone Encounter (Signed)
Spoke with patient. He has appt tomorrow with Dr Lorin Picket. Patient says he was just wondering when he was supposed to see [name] again because he has not talked to her in awhile. Advised that he had visit with her 7/3 and would follow up with her about when he should see her again. Pt gave verbal understanding. Also advised that he was supposed bring [name] sugar readings, patient said he does not have sensor on right now.  [name]- Can we get him set up for a follow up with you?

## 2023-06-17 ENCOUNTER — Ambulatory Visit (INDEPENDENT_AMBULATORY_CARE_PROVIDER_SITE_OTHER): Payer: Medicare HMO | Admitting: Internal Medicine

## 2023-06-17 VITALS — BP 126/70 | HR 72 | Temp 97.9°F | Resp 16 | Ht 68.0 in | Wt 222.0 lb

## 2023-06-17 DIAGNOSIS — D649 Anemia, unspecified: Secondary | ICD-10-CM

## 2023-06-17 DIAGNOSIS — N529 Male erectile dysfunction, unspecified: Secondary | ICD-10-CM

## 2023-06-17 DIAGNOSIS — I10 Essential (primary) hypertension: Secondary | ICD-10-CM

## 2023-06-17 DIAGNOSIS — E1149 Type 2 diabetes mellitus with other diabetic neurological complication: Secondary | ICD-10-CM

## 2023-06-17 DIAGNOSIS — K219 Gastro-esophageal reflux disease without esophagitis: Secondary | ICD-10-CM

## 2023-06-17 DIAGNOSIS — E7800 Pure hypercholesterolemia, unspecified: Secondary | ICD-10-CM

## 2023-06-17 DIAGNOSIS — Z0000 Encounter for general adult medical examination without abnormal findings: Secondary | ICD-10-CM | POA: Diagnosis not present

## 2023-06-17 DIAGNOSIS — D696 Thrombocytopenia, unspecified: Secondary | ICD-10-CM

## 2023-06-17 DIAGNOSIS — E611 Iron deficiency: Secondary | ICD-10-CM

## 2023-06-17 DIAGNOSIS — M545 Low back pain, unspecified: Secondary | ICD-10-CM

## 2023-06-17 DIAGNOSIS — K7460 Unspecified cirrhosis of liver: Secondary | ICD-10-CM

## 2023-06-17 DIAGNOSIS — Z125 Encounter for screening for malignant neoplasm of prostate: Secondary | ICD-10-CM | POA: Diagnosis not present

## 2023-06-17 DIAGNOSIS — M109 Gout, unspecified: Secondary | ICD-10-CM

## 2023-06-17 DIAGNOSIS — I700 Atherosclerosis of aorta: Secondary | ICD-10-CM | POA: Diagnosis not present

## 2023-06-17 DIAGNOSIS — K5900 Constipation, unspecified: Secondary | ICD-10-CM

## 2023-06-17 DIAGNOSIS — K769 Liver disease, unspecified: Secondary | ICD-10-CM

## 2023-06-17 DIAGNOSIS — G8929 Other chronic pain: Secondary | ICD-10-CM | POA: Diagnosis not present

## 2023-06-17 LAB — HEMOGLOBIN A1C: Hgb A1c MFr Bld: 8.8 % — ABNORMAL HIGH (ref 4.6–6.5)

## 2023-06-17 LAB — BASIC METABOLIC PANEL
BUN: 9 mg/dL (ref 6–23)
CO2: 28 mEq/L (ref 19–32)
Calcium: 8.9 mg/dL (ref 8.4–10.5)
Chloride: 100 mEq/L (ref 96–112)
Creatinine, Ser: 0.92 mg/dL (ref 0.40–1.50)
GFR: 94.57 mL/min (ref >60.00)
Glucose, Bld: 232 mg/dL — ABNORMAL HIGH (ref 70–99)
Potassium: 4.5 mEq/L (ref 3.5–5.1)
Sodium: 134 mEq/L — ABNORMAL LOW (ref 135–145)

## 2023-06-17 LAB — HEPATIC FUNCTION PANEL
ALT: 29 U/L (ref 0–53)
AST: 35 U/L (ref 0–37)
Albumin: 4.4 g/dL (ref 3.5–5.2)
Alkaline Phosphatase: 63 U/L (ref 39–117)
Bilirubin, Direct: 0.2 mg/dL (ref 0.0–0.3)
Total Bilirubin: 0.9 mg/dL (ref 0.2–1.2)
Total Protein: 7 g/dL (ref 6.0–8.3)

## 2023-06-17 LAB — MICROALBUMIN / CREATININE URINE RATIO
Creatinine,U: 113.2 mg/dL
Microalb Creat Ratio: 0.6 mg/g (ref 0.0–30.0)
Microalb, Ur: <0.7 mg/dL (ref 0.0–1.9)

## 2023-06-17 LAB — TSH: TSH: 2.87 u[IU]/mL (ref 0.35–5.50)

## 2023-06-17 LAB — CBC WITH DIFFERENTIAL/PLATELET
Basophils Absolute: 0 10*3/uL (ref 0.0–0.1)
Basophils Relative: 0.4 % (ref 0.0–3.0)
Eosinophils Absolute: 0.1 10*3/uL (ref 0.0–0.7)
Eosinophils Relative: 1.9 % (ref 0.0–5.0)
HCT: 39.2 % (ref 39.0–52.0)
Hemoglobin: 12.5 g/dL — ABNORMAL LOW (ref 13.0–17.0)
Lymphocytes Relative: 35.7 % (ref 12.0–46.0)
Lymphs Abs: 2.7 10*3/uL (ref 0.7–4.0)
MCHC: 32 g/dL (ref 30.0–36.0)
MCV: 84 fl (ref 78.0–100.0)
Monocytes Absolute: 0.8 10*3/uL (ref 0.1–1.0)
Monocytes Relative: 10.4 % (ref 3.0–12.0)
Neutro Abs: 3.9 10*3/uL (ref 1.4–7.7)
Neutrophils Relative %: 51.6 % (ref 43.0–77.0)
Platelets: 126 10*3/uL — ABNORMAL LOW (ref 150.0–400.0)
RBC: 4.66 Mil/uL (ref 4.22–5.81)
RDW: 15.4 % (ref 11.5–15.5)
WBC: 7.5 10*3/uL (ref 4.0–10.5)

## 2023-06-17 LAB — LIPID PANEL
Cholesterol: 129 mg/dL (ref 0–200)
HDL: 43.6 mg/dL (ref >39.00)
LDL Cholesterol: 46 mg/dL (ref 0–99)
NonHDL: 85.22
Total CHOL/HDL Ratio: 3
Triglycerides: 195 mg/dL — ABNORMAL HIGH (ref 0.0–149.0)
VLDL: 39 mg/dL (ref 0.0–40.0)

## 2023-06-17 LAB — PSA: PSA: 0.41 ng/mL (ref 0.10–4.00)

## 2023-06-17 MED ORDER — FREESTYLE LIBRE 3 SENSOR MISC
5 refills | Status: DC
Start: 2023-06-17 — End: 2024-03-05

## 2023-06-17 MED ORDER — ALLOPURINOL 300 MG PO TABS: 300.0000 mg | ORAL_TABLET | Freq: Every day | ORAL | 1 refills | Status: DC

## 2023-06-17 NOTE — Progress Notes (Unsigned)
Subjective:    [name], male    DOB: [birth year], 52 y.o.   MRN: [id number]  Patient here for  Chief Complaint  Patient presents with   Annual Exam    HPI Here for a physical exam. Sugars have not been controlled.  On tresiba.  Just recently increased to 24 units daily.  Ozempic on hold due to constipation.  Saw urology 04/20/23 - ED - trial of sildenafil. Saw GI (Dr Tobi Bastos) - 01/2023 - recommended MRI of the liver in 07/05/2023 to screen for [facility].  Discussed need for f/u with GI.  He plans to call. Still increased issues with constipation.  Discussed enema prn.  Discussed bowel regimen.  No chest pain or sob reported.  No cough or congestion.  Had eye exam yesterday.  He informed me that he was not taking his insulin daily. Discussed importance of taking insulin daily.  Not wearing sensor currently.  Discussed need to monitor sugars. Due to see dermatology 08/2023.    Past Medical History:  Diagnosis Date   Allergy    Anemia    Aortic atherosclerosis (HCC)    Chronic back pain    Chronic back pain    Cirrhosis of liver (HCC)    Complication of anesthesia    hard time to wake up x1   COVID-19    DM (diabetes mellitus), type 2 (HCC)    GERD (gastroesophageal reflux disease)    Gout    Hypercholesterolemia    Hypertension    Liver lesion    Migraines    Obesity    Sleep apnea    Subarachnoid hemorrhage (HCC) 11/2020   no surgery   Thrombocytopenia (HCC)    TIA (transient ischemic attack)    Past Surgical History:  Procedure Laterality Date   BACK SURGERY  08/12/2014   multiple   BOTOX INJECTION N/A 11/04/2022   Procedure: BOTOX INJECTION;  Surgeon: Leafy Ro, MD;  Location: [facility] ORS;  Service: General;  Laterality: N/A;   CHOLECYSTECTOMY     COLONOSCOPY WITH PROPOFOL N/A 03/11/2022   Procedure: COLONOSCOPY WITH PROPOFOL;  Surgeon: Wyline Mood, MD;  Location: [facility] ENDOSCOPY;  Service: Gastroenterology;  Laterality: N/A;   COLONOSCOPY WITH PROPOFOL N/A  11/17/2022   Procedure: COLONOSCOPY WITH PROPOFOL;  Surgeon: Wyline Mood, MD;  Location: [facility] ENDOSCOPY;  Service: Gastroenterology;  Laterality: N/A;   COLONOSCOPY WITH PROPOFOL N/A 11/24/2022   Procedure: COLONOSCOPY WITH PROPOFOL;  Surgeon: Wyline Mood, MD;  Location: [facility] ENDOSCOPY;  Service: Gastroenterology;  Laterality: N/A;   ESOPHAGOGASTRODUODENOSCOPY N/A 03/11/2022   Procedure: ESOPHAGOGASTRODUODENOSCOPY (EGD);  Surgeon: Wyline Mood, MD;  Location: [facility] ENDOSCOPY;  Service: Gastroenterology;  Laterality: N/A;   KNEE ARTHROSCOPY Left    Dr Monica Martinez   SPHINCTEROTOMY N/A 11/04/2022   Procedure: SPHINCTEROTOMY;  Surgeon: Leafy Ro, MD;  Location: [facility] ORS;  Service: General;  Laterality: N/A;   Family History  Problem Relation Age of Onset   Cancer Father        germ cell (retroperitoneal)   Coronary artery disease Other        s/p stent (age 42)   Hypertension Paternal Grandfather    Kidney cancer Paternal Grandfather    Diabetes Paternal Grandfather    Diabetes Maternal Grandfather    Heart disease Cousin        MI - age 87   Social History   Socioeconomic History   Marital status: Married    Spouse name: Not on file  Number of children: 2   Years of education: 12   Highest education level: Not on file  Occupational History   Occupation: disabled    Employer: [employer]  Tobacco Use   Smoking status: Never   Smokeless tobacco: Never  Vaping Use   Vaping status: Never Used  Substance and Sexual Activity   Alcohol use: No    Alcohol/week: 0.0 standard drinks of alcohol   Drug use: Never   Sexual activity: Not on file  Other Topics Concern   Not on file  Social History Narrative   Lives with girlfriend in a one story home.  Has 2 sons.  Trying to get disability due to several back issues.  Education: high school.    Social Determinants of Health   Financial Resource Strain: Low Risk  (02/28/2023)   Overall Financial Resource Strain (CARDIA)     Difficulty of Paying Living Expenses: Not hard at all  Food Insecurity: No Food Insecurity (02/28/2023)   Hunger Vital Sign    Worried About Running Out of Food in the Last Year: Never true    Ran Out of Food in the Last Year: Never true  Transportation Needs: No Transportation Needs (02/28/2023)   PRAPARE - Administrator, Civil Service (Medical): No    Lack of Transportation (Non-Medical): No  Physical Activity: Not on file  Stress: No Stress Concern Present (02/28/2023)   [employer] of Occupational Health - Occupational Stress Questionnaire    Feeling of Stress : Not at all  Social Connections: Unknown (02/28/2023)   Social Connection and Isolation Panel [NHANES]    Frequency of Communication with Friends and Family: Not on file    Frequency of Social Gatherings with Friends and Family: Not on file    Attends Religious Services: Not on file    Active Member of Clubs or Organizations: Not on file    Attends Banker Meetings: Not on file    Marital Status: Married     Review of Systems  Constitutional:  Negative for appetite change and unexpected weight change.  HENT:  Negative for congestion, sinus pressure and sore throat.   Eyes:  Negative for pain and visual disturbance.  Respiratory:  Negative for cough, chest tightness and shortness of breath.   Cardiovascular:  Negative for chest pain, palpitations and leg swelling.  Gastrointestinal:  Positive for constipation. Negative for diarrhea, nausea and vomiting.  Genitourinary:  Negative for difficulty urinating and dysuria.  Musculoskeletal:  Negative for joint swelling and myalgias.  Skin:  Negative for color change and rash.  Neurological:  Negative for dizziness and headaches.  Hematological:  Negative for adenopathy. Does not bruise/bleed easily.  Psychiatric/Behavioral:  Negative for agitation and dysphoric mood.        Objective:     BP 126/70   Pulse 72   Temp 97.9 F (36.6 C)   Resp  16   Ht 5\' 8"  (1.727 m)   Wt 222 lb (100.7 kg)   SpO2 98%   BMI 33.75 kg/m  Wt Readings from Last 3 Encounters:  06/17/23 222 lb (100.7 kg)  04/20/23 217 lb 4 oz (98.5 kg)  03/17/23 215 lb 9.6 oz (97.8 kg)    Physical Exam Constitutional:      General: He is not in acute distress.    Appearance: Normal appearance. He is well-developed.  HENT:     Head: Normocephalic and atraumatic.     Right Ear: External ear normal.  Left Ear: External ear normal.  Eyes:     General: No scleral icterus.       Right eye: No discharge.        Left eye: No discharge.     Conjunctiva/sclera: Conjunctivae normal.  Neck:     Thyroid: No thyromegaly.  Cardiovascular:     Rate and Rhythm: Normal rate and regular rhythm.  Pulmonary:     Effort: No respiratory distress.     Breath sounds: Normal breath sounds. No wheezing.  Abdominal:     General: Bowel sounds are normal.     Palpations: Abdomen is soft.     Tenderness: There is no abdominal tenderness.  Musculoskeletal:        General: No swelling or tenderness.     Cervical back: Neck supple. No tenderness.  Lymphadenopathy:     Cervical: No cervical adenopathy.  Skin:    Findings: No erythema or rash.  Neurological:     Mental Status: He is alert and oriented to person, place, and time.  Psychiatric:        Mood and Affect: Mood normal.        Behavior: Behavior normal.      Outpatient Encounter Medications as of 06/17/2023  Medication Sig   allopurinol (ZYLOPRIM) 300 MG tablet Take 1 tablet (300 mg total) by mouth daily.   atorvastatin (LIPITOR) 40 MG tablet Take 1 tablet (40 mg total) by mouth daily.   bisacodyl (DULCOLAX) 5 MG EC tablet Take 5 mg by mouth daily as needed for moderate constipation.   Continuous Glucose Sensor (FREESTYLE LIBRE 3 SENSOR) MISC Place 1 sensor on the skin every 14 days. Use to check glucose continuously   insulin degludec (TRESIBA FLEXTOUCH) 200 UNIT/ML FlexTouch Pen Inject 24 Units into the skin  daily.   Insulin Pen Needle (PEN NEEDLES) 32G X 4 MM MISC Use to inject medication daily   lactulose (CHRONULAC) 10 GM/15ML solution Take 30 mLs (20 g total) by mouth in the morning, at noon, and at bedtime.   metaxalone (SKELAXIN) 800 MG tablet Take 800 mg by mouth 3 (three) times daily.   metFORMIN (GLUCOPHAGE-XR) 500 MG 24 hr tablet Take 1 tablet (500 mg total) by mouth daily with breakfast.   metoprolol succinate (TOPROL-XL) 50 MG 24 hr tablet TAKE 2 TABLETS BY MOUTH DAILY WITH OR IMMEDIATELY FOLLOWING A MEAL.   oxyCODONE (ROXICODONE) 15 MG immediate release tablet Take 15 mg by mouth every 4 (four) hours.   pantoprazole (PROTONIX) 40 MG tablet TAKE 1 TABLET (40 MG TOTAL) BY MOUTH TWICE A DAY BEFORE MEALS   sildenafil (REVATIO) 20 MG tablet Take 1-5 tablets 1 hour prior to intercourse as needed   triamterene-hydrochlorothiazide (MAXZIDE-25) 37.5-25 MG tablet Take 1 tablet by mouth daily.   [DISCONTINUED] allopurinol (ZYLOPRIM) 300 MG tablet Take 1 tablet (300 mg total) by mouth daily.   [DISCONTINUED] Continuous Glucose Sensor (FREESTYLE LIBRE 3 SENSOR) MISC Place 1 sensor on the skin every 14 days. Use to check glucose continuously   [DISCONTINUED] diclofenac (VOLTAREN) 75 MG EC tablet Take 75 mg by mouth 2 (two) times daily. (Patient not taking: Reported on 04/26/2023)   [DISCONTINUED] ferrous sulfate (FERROUSUL) 325 (65 FE) MG tablet Take 1 tablet (325 mg total) by mouth every other day. (Patient not taking: Reported on 04/26/2023)   [DISCONTINUED] Semaglutide,0.25 or 0.5MG /DOS, (OZEMPIC, 0.25 OR 0.5 MG/DOSE,) 2 MG/3ML SOPN Inject 0.5 mg into the skin once a week. (Patient not taking: Reported on 04/26/2023)  No facility-administered encounter medications on file as of 06/17/2023.     Lab Results  Component Value Date   WBC 7.5 06/17/2023   HGB 12.5 (L) 06/17/2023   HCT 39.2 06/17/2023   PLT 126.0 (L) 06/17/2023   GLUCOSE 232 (H) 06/17/2023   CHOL 129 06/17/2023   TRIG 195.0 (H)  06/17/2023   HDL 43.60 06/17/2023   LDLDIRECT 53.0 09/08/2022   LDLCALC 46 06/17/2023   ALT 29 06/17/2023   AST 35 06/17/2023   NA 134 (L) 06/17/2023   K 4.5 06/17/2023   CL 100 06/17/2023   CREATININE 0.92 06/17/2023   BUN 9 06/17/2023   CO2 28 06/17/2023   TSH 2.87 06/17/2023   PSA 0.41 06/17/2023   INR 1.1 02/03/2023   HGBA1C 8.8 (H) 06/17/2023   MICROALBUR <0.7 06/17/2023    MR LIVER W WO CONTRAST  Result Date: 12/21/2022 CLINICAL DATA:  52 year old male with history of cirrhosis and indeterminate liver lesion noted on prior examination. Follow-up study. EXAM: MRI ABDOMEN WITHOUT AND WITH CONTRAST TECHNIQUE: Multiplanar multisequence MR imaging of the abdomen was performed both before and after the administration of intravenous contrast. CONTRAST:  10mL GADAVIST GADOBUTROL 1 MMOL/ML IV SOLN COMPARISON:  Abdominal MRI 06/15/2022. FINDINGS: Lower chest: Unremarkable. Hepatobiliary: Diffuse loss of signal intensity throughout the hepatic parenchyma on out of phase dual echo images, indicative of a background of hepatic steatosis. Liver has a slightly shrunken appearance and nodular contour, indicative of underlying cirrhosis. In segment 6 of the liver there is a well-defined 8 mm T1 hypointense, T2 hyperintense, nonenhancing lesion, compatible with a small simple cyst (no imaging follow-up recommended). 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 This is stable compared to the prior study. No other suspicious appearing hepatic lesions are noted. No intra or extrahepatic biliary ductal dilatation. Status post cholecystectomy. Pancreas: No pancreatic mass. No pancreatic ductal dilatation. No pancreatic or peripancreatic fluid  collections or inflammatory changes. Spleen: Spleen is enlarged measuring 9.7 x 7.3 x 14.2 cm (estimated splenic volume of 503 mL). Adrenals/Urinary Tract: Bilateral kidneys and bilateral adrenal glands are normal in appearance. No hydroureteronephrosis in the visualized portions of the abdomen. Stomach/Bowel: Visualized portions are unremarkable. Vascular/Lymphatic: No aneurysm identified in the visualized abdominal vasculature. Portal vein is patent and normal in caliber measuring 12 mm. No lymphadenopathy noted in the abdomen. Other: No significant volume of ascites noted in the visualized portions of the peritoneal cavity. Musculoskeletal: No aggressive appearing osseous lesions are noted in the visualized portions of the skeleton. IMPRESSION: 1. Morphologic changes in the liver indicative of cirrhosis, with a background of mild hepatic steatosis. No suspicious hepatic lesions are noted on today's examination to suggest the presence of hepatocellular carcinoma. Continued clinical surveillance is recommended. 2. Small hepatic lesions including a tiny benign-appearing cyst and what appears to be a flash fill cavernous hemangioma, as above. No imaging follow-up is recommended. 3. Splenomegaly. Electronically Signed   By: Trudie Reed M.D.   On: 12/21/2022 11:08       Assessment & Plan:  Routine general medical examination at a health care facility  Healthcare maintenance Assessment & Plan: Physical today 06/17/23.  Colonoscopy 02/2022 - multiple polyps.  Check psa.  Type 2 diabetes mellitus with neurological complications [facility]) Assessment & Plan: Have dscussed diet and exercise.  On tresiba.  Recently increased to 24 units. Not taking daily.  Discussed importance of taking regularly.  Also discussed setting reminders.  Check met b and A1c.  Did not tolerate .5mg  ozempic - increased constipation issues.   Orders: -     Hemoglobin A1c -     Microalbumin / creatinine urine ratio -     FreeStyle  Libre 3 Sensor; Place 1 sensor on the skin every 14 days. Use to check glucose continuously  Dispense: 2 each; Refill: 5  Hypercholesterolemia Assessment & Plan: Was on lipitor. Low cholesterol diet and exercise.  Follow lipid panel and liver function tests.  Statin stopped while pursuing liver w/up.  Follow lipid panel.   Orders: -     Lipid panel -     Basic metabolic panel -     Hepatic function panel  Anemia, unspecified type Assessment & Plan: Had colonoscopy 02/2022.  Multiple polyps.  Recommended f/u in 6-8 months.  (Dr Tobi Bastos).  F/u colonoscopy 11/24/22 - normal.  Recommended f/u colonoscopy in one year.  Not taking iron. Recheck cbc and iron studies.   Orders: -     CBC with Differential/Platelet -     TSH  Prostate cancer screening -     PSA  Aortic atherosclerosis (HCC) Assessment & Plan: Lipitor.     Chronic low back pain, unspecified back pain laterality, unspecified whether sciatica present Assessment & Plan: Followed by pain clinic.  On oxycodone.  Discussed the need to keep bowel moving.  Discussed bowel regimen.     Cirrhosis of liver without ascites, unspecified hepatic cirrhosis type [facility]) Assessment & Plan: Saw GI (Dr Tobi Bastos) - 01/2023 - recommended MRI of the liver in 07/05/2023 to screen for [facility]. Requires hepatitis B vaccination - states receive first.  Continue f/u with GI. Plans to call for f/u.    Constipation, unspecified constipation type Assessment & Plan: Takes narcotic pain medication. Have discussed bowel regimen - to keep bowels moving.  Saw GI.  Colonoscopy 03/11/22 - Four 2 to 3 mm polyps in the cecum, removed with a cold biopsy forceps. Resected and retrieved. Two 4 to 6 mm polyps in the cecum, removed with a cold snare. Resected and retrieved. Three 5 to 7 mm polyps in the ascending colon, removed with a cold snare. Resected and retrieved. One 15 mm polyp in the ascending colon, removed with mucosal resection. Resected and retrieved. Clip was placed.  The examination was otherwise normal on direct and retroflexion views. Mucosal resection was performed. Resection and retrieval were complete. Continue f/u with GI.  Recommended f/u colonoscopy 6-8 months. Colonoscopy 11/2022 - normal.  Recommended f/u in one year.    Erectile dysfunction, unspecified erectile dysfunction type Assessment & Plan: Saw urology 04/20/23 - ED - trial of sildenafil.   Gastroesophageal reflux disease, unspecified whether esophagitis present Assessment & Plan: On protonix.  Follow.    Gout, unspecified cause, unspecified chronicity, unspecified site Assessment & Plan: No reported flares since last visit.  Follow.  Continues allopurinol.    Primary hypertension Assessment & Plan: Blood pressure doing well.  Continue triam/hctz and metoprolol.  Follow pressures.  Follow metabolic panel.    Iron deficiency Assessment & Plan: Dr Tobi Bastos 01/2023 - recommended - Capsule study of the small bowel in view of iron deficiency. EGD to screen for esophageal varices in April 2026. Oral iron ferrous sulfate 325 mg 1  tablet every other day to be continued.  Colonoscopy 2025. Did not follow up.  Plans to call.    Liver lesion Assessment & Plan: Saw GI (Dr Tobi Bastos) - 01/2023 - recommended MRI of the liver in 07/05/2023 to screen for [facility].  Plans to call for f/u.    Thrombocytopenia (HCC) Assessment & Plan: Low platelet count - feel related to his underlying liver issues.  Follow cbc.     Other orders -     Allopurinol; Take 1 tablet (300 mg total) by mouth daily.  Dispense: 90 tablet; Refill: 1     Dale Presidio, MD

## 2023-06-17 NOTE — Assessment & Plan Note (Signed)
Physical today 06/17/23.  Colonoscopy 02/2022 - multiple polyps.  Check psa.

## 2023-06-17 NOTE — Telephone Encounter (Signed)
He did not. I will discuss with him today at his appt.

## 2023-06-19 ENCOUNTER — Encounter: Payer: Self-pay | Admitting: Internal Medicine

## 2023-06-19 NOTE — Assessment & Plan Note (Signed)
Lipitor 

## 2023-06-19 NOTE — Assessment & Plan Note (Signed)
Low platelet count - feel related to his underlying liver issues.  Follow cbc.   

## 2023-06-19 NOTE — Assessment & Plan Note (Signed)
No reported flares since last visit.  Follow.  Continues allopurinol.

## 2023-06-19 NOTE — Assessment & Plan Note (Signed)
Takes narcotic pain medication. Have discussed bowel regimen - to keep bowels moving.  Saw GI.  Colonoscopy 03/11/22 - Four 2 to 3 mm polyps in the cecum, removed with a cold biopsy forceps. Resected and retrieved. Two 4 to 6 mm polyps in the cecum, removed with a cold snare. Resected and retrieved. Three 5 to 7 mm polyps in the ascending colon, removed with a cold snare. Resected and retrieved. One 15 mm polyp in the ascending colon, removed with mucosal resection. Resected and retrieved. Clip was placed. The examination was otherwise normal on direct and retroflexion views. Mucosal resection was performed. Resection and retrieval were complete. Continue f/u with GI.  Recommended f/u colonoscopy 6-8 months. Colonoscopy 11/2022 - normal.  Recommended f/u in one year.

## 2023-06-19 NOTE — Assessment & Plan Note (Signed)
Was on lipitor. Low cholesterol diet and exercise.  Follow lipid panel and liver function tests.  Statin stopped while pursuing liver w/up.  Follow lipid panel.

## 2023-06-19 NOTE — Assessment & Plan Note (Signed)
Saw GI (Dr Tobi Bastos) - 01/2023 - recommended MRI of the liver in 07/05/2023 to screen for [facility].  Plans to call for f/u.

## 2023-06-19 NOTE — Assessment & Plan Note (Signed)
Followed by pain clinic.  On oxycodone.  Discussed the need to keep bowel moving.  Discussed bowel regimen.   ?

## 2023-06-19 NOTE — Assessment & Plan Note (Signed)
Saw GI (Dr Tobi Bastos) - 01/2023 - recommended MRI of the liver in 07/05/2023 to screen for [facility]. Requires hepatitis B vaccination - states receive first.  Continue f/u with GI. Plans to call for f/u.

## 2023-06-19 NOTE — Assessment & Plan Note (Signed)
Saw urology 04/20/23 - ED - trial of sildenafil.

## 2023-06-19 NOTE — Assessment & Plan Note (Signed)
Have dscussed diet and exercise.  On tresiba.  Recently increased to 24 units. Not taking daily.  Discussed importance of taking regularly.  Also discussed setting reminders.  Check met b and A1c.  Did not tolerate .5mg  ozempic - increased constipation issues.

## 2023-06-19 NOTE — Assessment & Plan Note (Signed)
Dr Tobi Bastos 01/2023 - recommended - Capsule study of the small bowel in view of iron deficiency. EGD to screen for esophageal varices in April 2026. Oral iron ferrous sulfate 325 mg 1 tablet every other day to be continued.  Colonoscopy 2025. Did not follow up.  Plans to call.

## 2023-06-19 NOTE — Assessment & Plan Note (Signed)
On protonix.  Follow.  

## 2023-06-19 NOTE — Assessment & Plan Note (Signed)
Had colonoscopy 02/2022.  Multiple polyps.  Recommended f/u in 6-8 months.  (Dr Tobi Bastos).  F/u colonoscopy 11/24/22 - normal.  Recommended f/u colonoscopy in one year.  Not taking iron. Recheck cbc and iron studies.

## 2023-06-19 NOTE — Assessment & Plan Note (Signed)
Blood pressure doing well.  Continue triam/hctz and metoprolol.  Follow pressures.  Follow metabolic panel.

## 2023-06-20 ENCOUNTER — Ambulatory Visit: Admission: RE | Admit: 2023-06-20 | Payer: Medicare HMO | Source: Ambulatory Visit

## 2023-06-22 ENCOUNTER — Ambulatory Visit: Payer: Medicare HMO

## 2023-06-30 DIAGNOSIS — M5137 Other intervertebral disc degeneration, lumbosacral region: Secondary | ICD-10-CM | POA: Diagnosis not present

## 2023-06-30 DIAGNOSIS — G894 Chronic pain syndrome: Secondary | ICD-10-CM | POA: Diagnosis not present

## 2023-06-30 DIAGNOSIS — M961 Postlaminectomy syndrome, not elsewhere classified: Secondary | ICD-10-CM | POA: Diagnosis not present

## 2023-06-30 DIAGNOSIS — M545 Low back pain, unspecified: Secondary | ICD-10-CM | POA: Diagnosis not present

## 2023-06-30 DIAGNOSIS — Z79891 Long term (current) use of opiate analgesic: Secondary | ICD-10-CM | POA: Diagnosis not present

## 2023-07-01 ENCOUNTER — Telehealth: Payer: Self-pay

## 2023-07-01 NOTE — Telephone Encounter (Signed)
Called patient to let him know that we received his ozempic. Patient stated that he is off of this medication right now and does not know if he is going to restart due to constipation. Has f/u with [name]. Will discuss if he is going to restart.

## 2023-07-21 ENCOUNTER — Encounter: Payer: Self-pay | Admitting: Pharmacist

## 2023-07-25 ENCOUNTER — Other Ambulatory Visit: Payer: Medicare HMO | Admitting: Pharmacist

## 2023-07-27 ENCOUNTER — Other Ambulatory Visit: Payer: Medicare HMO | Admitting: Pharmacist

## 2023-07-29 DIAGNOSIS — Z79891 Long term (current) use of opiate analgesic: Secondary | ICD-10-CM | POA: Diagnosis not present

## 2023-07-29 DIAGNOSIS — M961 Postlaminectomy syndrome, not elsewhere classified: Secondary | ICD-10-CM | POA: Diagnosis not present

## 2023-07-29 DIAGNOSIS — G894 Chronic pain syndrome: Secondary | ICD-10-CM | POA: Diagnosis not present

## 2023-07-29 DIAGNOSIS — M545 Low back pain, unspecified: Secondary | ICD-10-CM | POA: Diagnosis not present

## 2023-07-29 DIAGNOSIS — M5137 Other intervertebral disc degeneration, lumbosacral region: Secondary | ICD-10-CM | POA: Diagnosis not present

## 2023-07-31 NOTE — Patient Instructions (Incomplete)
Mr. ZYMARION DELPRADO,   It was a pleasure to speak with you today! As we discussed:?  -  -  -    Follow up with Dr. Marland Kitchen   Follow up with clinical pharmacist on ***    Berenice Primas, PharmD - Clinical Pharmacist Clinic Phone: ***

## 2023-07-31 NOTE — Progress Notes (Unsigned)
07/31/2023 Name: Cameron Sanchez MRN: [id number] DOB: [birth year]  No chief complaint on file.   Cameron Sanchez is a 52 y.o. year old male who presented for a telephone visit.   They were referred to the pharmacist by their PCP for assistance in managing diabetes.    Subjective:  Last diabetes-related Visit: 05/18/23 with Clinical Pharmacist  Change at last visit: Increase Tresiba from 18 to 24 units daily   Care Team: Primary Care Provider: Dale Cathlamet, MD ; Next Scheduled Visit: 06/17/23  Medication Access/Adherence  Current Pharmacy:  [id number] - [place], [place] - [address] [place] [address] Phone: [phone] Fax: [phone]  [facility] [address] [place] - [address] [place] [address] Phone: [phone] Fax: [phone]   Patient reports affordability concerns with their medications: No  Patient reports access/transportation concerns to their pharmacy: No Patient reports adherence concerns with their medications:  No    Diabetes:  Current medications:  metformin XR 500 mg daily Ozempic - holding due to constipation Tresiba 24*** units daily  CGM Report (Date Range *** - ***) ABG *** mg/dL; TIR ***%, high ***%, very high ***%, low ***%, very low ***%   Date of Download: 6/15-6/28/24 % Time CGM is active: 93% Average Glucose: 205 mg/dL Glucose Management Indicator: 8.2  Glucose Variability: 26.7 (goal <36%) Time in Goal:  - Time in range 70-180: 37% - Time above range: 63% - Time below range: 0% Observed patterns:   Constipation: Current medications:  bisacodyl 5 mg three times daily stool softener*** as needed  Reports continued discomfort on the top of his stomach***.  [ ] Has not rescheduled capsule study (canceled due to significant stomach upset that day) but plans to.  [ ] He notes that he believes this is most related to opioids, but believes the Ozempic was worsening it.    Objective:  Lab Results  Component Value Date   HGBA1C 8.8 (H) 06/17/2023   HGBA1C 7.5 (H) 04/08/2023   HGBA1C 9.1 (H) 12/31/2022   HGBA1C 14.9 (H) 09/08/2022    Lab Results  Component Value Date   CREATININE 0.92 06/17/2023   BUN 9 06/17/2023   NA 134 (L) 06/17/2023   K 4.5 06/17/2023   CL 100 06/17/2023   CO2 28 06/17/2023    Lab Results  Component Value Date   CHOL 129 06/17/2023   HDL 43.60 06/17/2023   LDLCALC 46 06/17/2023   LDLDIRECT 53.0 09/08/2022   TRIG 195.0 (H) 06/17/2023   CHOLHDL 3 06/17/2023    Medications Reviewed Today   Medications were not reviewed in this encounter    Assessment/Plan:   Diabetes: Currently uncontrolled with A1c 8.8% (06/17/23), increased from previous 7.5% in part related to holding Ozempic (constipation). CGM report today suggests *** glycemic control: ABG *** mg/dL; TIR ***%, high ***%, very high ***%.  Continue *** Tresiba 24 units daily  *** Ozempic    - Reviewed goal A1c, goal fasting, and goal 2 hour post prandial glucose - Recommend to increase [place] to 24 units daily. Continue to hold Ozempic to avoid exacerbation of baseline constipation. Continue metformin at this time - Recommend to check glucose continuously using Libre   Constipation: - Currently uncontrolled - Encouraged to use bisacodyl + docusate scheduled twice daily. If no benefit from that, would encourage follow up with GI and/or consideration of alternative agents for constipation. Appears Movantik is preferred on patient's formulary.   Follow Up Plan:  patient will update me on glucose readings next week   Loree Fee, PharmD Clinical Pharmacist [facility] [phone]

## 2023-08-01 ENCOUNTER — Other Ambulatory Visit: Payer: Medicare HMO | Admitting: Pharmacist

## 2023-08-01 DIAGNOSIS — K5900 Constipation, unspecified: Secondary | ICD-10-CM

## 2023-08-01 DIAGNOSIS — E1149 Type 2 diabetes mellitus with other diabetic neurological complication: Secondary | ICD-10-CM

## 2023-08-01 DIAGNOSIS — R739 Hyperglycemia, unspecified: Secondary | ICD-10-CM

## 2023-08-02 DIAGNOSIS — F4542 Pain disorder with related psychological factors: Secondary | ICD-10-CM | POA: Diagnosis not present

## 2023-08-08 ENCOUNTER — Ambulatory Visit: Payer: Medicare HMO

## 2023-08-09 ENCOUNTER — Other Ambulatory Visit: Payer: Medicare HMO | Admitting: Pharmacist

## 2023-08-09 NOTE — Patient Instructions (Signed)
Mr. ZAKARIYA OHERRON,   It was a pleasure to speak with you today! As we discussed:?   Diabetes Medication Start taking Ozempic 0.25 mg under the skin once weekly (use a new, unopened pen) Continue metformin XR 500 mg once daily Increase Tresiba from 24 units to 28 units under the skin once daily.   What If My [employer] Early or Isn't Working? Call [employer] Customer Care Team at [phone] for a free replacement sensor  Available 7 days a week from 8AM-8PM EST, excluding holidays There are some strategies that can help to keep the sensors on.   Make sure you don't have lotion on your arms/abdomen before applying the sensor.  Make sure your skin is clean and dry before applying a new sensor. You can go to your local pharmacy and ask for Tegaderm, or something similar.  It is a clear, waterproof patch that you can lay over the sensor to help it to stay on longer. Or you can order patches from [employer] (or elsewhere online). An example is "grif grips" at [email or web address].  Try searching "Libre patch" on [employer]. Be sure to call [facility] and ask for replacements for the sensors that have fallen off.  They are usually pretty good about replacing at least some of the lost sensors.   Constipation: Continue taking bisacodyl three times daily as you have been (solid orange pill) It is generally recommended to use docusate daily when using chronic opioid medication Start using your stool softener pill (docusate) 1-2 times daily every day to prevent any worsening in constipation as you resume your Ozempic.  Focus on remaining well hydrated and consider increasing dietary fiber.    Please reach out sooner via MyChart or by clinic phone if any concerns.  Follow up with Dr. Lorin Picket is scheduled 09/19/23  Follow up with clinical pharmacist is about 1 week to review your Libre readings.   Berenice Primas, PharmD - Clinical Pharmacist

## 2023-08-09 NOTE — Progress Notes (Signed)
08/09/2023 Name: Cameron Sanchez MRN: [id number] DOB: 1971-01-24  Chief Complaint  Patient presents with   Diabetes   Constipation    Cameron Sanchez is a 52 y.o. year old male who presented for a telephone visit.   They were referred to the pharmacist by their PCP for assistance in managing diabetes.   Subjective:  Last diabetes-related Visit: 08/01/23 with Clinical Pharmacist  At last visit:  Restart Ozempic 0.25 mg sq weekly  Increase metformin XR back to 500 mg daily Continue Tresiba 24 units daily  Encouraged to use bisacodyl + docusate scheduled especially if constipation worsens with restarting Ozempic   Care Team: Primary Care Provider: Dale Crane, MD ; Next Scheduled Visit: 09/19/23  Medication Access/Adherence  Current Pharmacy:  [id number] - [place], [place] - [address] [place] [address] Phone: [phone] Fax: [phone]  [facility] [address] [place] - [address] [place] [address] Phone: [phone] Fax: [phone]   Patient reports affordability concerns with their medications: No  Patient reports access/transportation concerns to their pharmacy: No Patient reports adherence concerns with their medications:  No    Diabetes: Current medications:  metformin XR 500 mg daily  Ozempic 0.25 mg sq weekly (did not restart due to constipation) [name] 24 units daily  Since last visit, patient reports partially implementing plan. Continues to report good adherence with [place]. Reports he did resume metformin 500 mg daily tough he continues to have apprehension due to colleagues stories of how metformin has caused them leg pain and reduced sexual drive.   He did not resume Ozempic as discussed due to constipation despite regular bowel movements reported (about every other day). Continues to use only bisacodyl scheduled.   Minimal/no CGM data since August. Denies issues with cost/access and reports  he was bumping it a lot so took a break from wearing. Denies requesting replacement CGM when sensor falls off early. Last week, he placed a new sensor tohugh states that it fell off after 3 days.   SMBG: Libre 3 (Linked) Only 3 days of data in the past several months due to sensors falling off early.  3-Day Report: ABG 286 mg/dL; GMI N/A%; TIR 0%, high 31%, very high 69%   Constipation: Current medications:  Bisacodyl 5 mg three times daily Docusate (still only using as needed)  Previous: Miralax (felt it was ineffective after single dose, was not using regularly) Enema (very effective, has only used once s/p 4 days without BM)  Currently has a bowel movement every other day.   Drinks water, has been trying to cut back on sweet tea.   Has not rescheduled capsule study with GI (canceled due to significant stomach upset with the bowel prep). Plans to reschedule this at some point, but not in the immediate future. States he has another colonoscopy coming up.   Objective:  Lab Results  Component Value Date   HGBA1C 8.8 (H) 06/17/2023   HGBA1C 7.5 (H) 04/08/2023   HGBA1C 9.1 (H) 12/31/2022   HGBA1C 14.9 (H) 09/08/2022    Lab Results  Component Value Date   CREATININE 0.92 06/17/2023   BUN 9 06/17/2023   NA 134 (L) 06/17/2023   K 4.5 06/17/2023   CL 100 06/17/2023   CO2 28 06/17/2023    Lab Results  Component Value Date   CHOL 129 06/17/2023   HDL 43.60 06/17/2023   LDLCALC 46 06/17/2023   LDLDIRECT 53.0 09/08/2022  TRIG 195.0 (H) 06/17/2023   CHOLHDL 3 06/17/2023    Medications Reviewed Today   Medications were not reviewed in this encounter    Assessment/Plan:   Diabetes: Currently uncontrolled with A1c 8.8% (06/17/23), increased from previous 7.5% secondary to holding Ozempic (c/f worsening baseline opioid-induced constipation) and holding/reducing metformin d/t c/f "leg pain". Patient confirmed resuming metformin 1 week ago and denies adverse effects. However,  he did not restart Ozempic as instructed due to fear of constipation. Only 3 days of BG data in the past few months due to issues with sensor staying on though per the limited data, sugars appear to be significantly uncontrolled with 3-day average sugar 286 mg/dL likely secondary to stopping DM medications.  Increase Tresiba 10-20% (28 units daily = 16.6% increase) Start Ozempic 0.25 mg sq weekly  Continue metformin XR 500 mg daily Provided patient with resources/suggestions via MyChart to help Libre sensors stay on longer. Emphasize importance of BG monitoring in the setting of medication adjustment. Encouraged him to contact [employer] for replacement sensors.  Reviewed goal A1c, goal fasting, and goal 2 hour post prandial glucose  Future considerations: - Optimize metformin XR dose to 2000 mg daily (though patient extremely hesitant to use metformin) - Optimize Ozempic dose - SGLT2i is reasonable though consider initiation once glycemic control is slightly improved (volume depletion w severe hyperglycemia may exacerbate dehydration/constipation issue)   Constipation:  Well controlled on bisacodyl TID with BM every other day though he reports not starting Ozempic due to current level of constipation. We reviewed that generally, those on chronic opioids are recommended to use a stimulant and stool softener scheduled as to reduce OID. He confirms willingness to start using docusate scheduled daily instead of PRN. Encouraged to use bisacodyl + docusate scheduled especially if constipation worsens with restarting Ozempic Patient to schedule follow up with GI (previously canceled appointment for capsule motility study) Focus on staying well-hydrated and maintaining good dietary fiber intake.  Future consideration: - Appears Movantik is preferred on patient's formulary.  - Miralax - Patient reports low efficacy though was not taking regularly. May consider in the future. - PRN saline laxative reasonable  for rare use if 4+ days without bowel movement (Milk of Mag, Mag Citrate, etc.)    Follow Up Plan:  Follow up with PharmD in 1 week via telephone check in to review CGM and continue to titrate insulin as needed.   Future Appointments  Date Time Provider Department Center  08/16/2023 10:30 AM [facility] CCM PHARMACIST [facility]  09/19/2023  9:30 AM Dale [place], MD [facility]  02/29/2024 10:30 AM [facility] ANNUAL WELLNESS VISIT [facility]    Loree Fee, PharmD Clinical Pharmacist [facility] [phone]

## 2023-08-16 ENCOUNTER — Encounter: Payer: Self-pay | Admitting: Pharmacist

## 2023-08-16 ENCOUNTER — Other Ambulatory Visit: Payer: Medicare HMO

## 2023-08-16 NOTE — Progress Notes (Deleted)
08/16/2023 Name: Cameron Sanchez MRN: [id number] DOB: [birth year]  No chief complaint on file.   Cameron Sanchez is a 52 y.o. year old male who presented for a telephone visit.   They were referred to the pharmacist by their PCP for assistance in managing diabetes.   Subjective:  Last diabetes-related Visit: 08/09/23 with Clinical Pharmacist  At last visit: Patient had not restarted his Ozempic due to fear of constipation.  Restart Ozempic 0.25 mg sq weekly  Continue metformin XR 500 mg daily Increase Tresiba 24>28 units daily (16.6% increase) Provided patient with resources to help [place] sensors stay on longer.  Encouraged him to contact [employer] for replacement sensors.  Encouraged to use bisacodyl + docusate scheduled  Care Team: Primary Care Provider: Dale [place], MD ; Next Scheduled Visit: 09/19/23  Medication Access/Adherence Patient reports that all medications are current affordable through insurance: Medicare  *** missed doses of medications.   Diabetes: Current medications:  metformin XR 500 mg daily  Ozempic 0.25 mg sq weekly (***) Tresiba 28*** units daily  Since last visit, patient reports partially implementing plan. Continues to report good adherence with [place]. Reports he did resume metformin 500 mg daily tough he continues to have apprehension due to colleagues stories of how metformin has caused them leg pain and reduced sexual drive.   He did not*** resume Ozempic as discussed due to constipation despite regular bowel movements reported (about every other day).   Continues to use only bisacodyl scheduled***.   Minimal/no CGM data since August. Denies issues with cost/access and reports he was bumping it a lot so took a break from wearing. Denies requesting replacement CGM when sensor falls off early. Last week, he placed a new sensor tohugh states that it fell off after 3 days.   SMBG: Libre 3 (Linked) Only 3 days of data in the past several months due  to sensors falling off early.  3-Day Report: ABG 286 mg/dL; GMI N/A%; TIR 0%, high 31%, very high 69%   Constipation: Current medications:  Bisacodyl 5 mg three times daily Docusate (still only using as needed)  Previous: Miralax (felt it was ineffective after single dose, was not using regularly) Enema (very effective, has only used once s/p 4 days without BM)  Currently has a bowel movement every other day.   Drinks water, has been trying to cut back on sweet tea.   Has not rescheduled capsule study with GI (canceled due to significant stomach upset with the bowel prep). Plans to reschedule this at some point, but not in the immediate future. States he has another colonoscopy coming up.   Objective:  Lab Results  Component Value Date   HGBA1C 8.8 (H) 06/17/2023   HGBA1C 7.5 (H) 04/08/2023   HGBA1C 9.1 (H) 12/31/2022   HGBA1C 14.9 (H) 09/08/2022    Lab Results  Component Value Date   CREATININE 0.92 06/17/2023   BUN 9 06/17/2023   NA 134 (L) 06/17/2023   K 4.5 06/17/2023   CL 100 06/17/2023   CO2 28 06/17/2023    Lab Results  Component Value Date   CHOL 129 06/17/2023   HDL 43.60 06/17/2023   LDLCALC 46 06/17/2023   LDLDIRECT 53.0 09/08/2022   TRIG 195.0 (H) 06/17/2023   CHOLHDL 3 06/17/2023    Medications Reviewed Today   Medications were not reviewed in this encounter    Assessment/Plan:   Diabetes: Currently uncontrolled with A1c 8.8% (06/17/23), increased from previous 7.5% secondary to holding Ozempic (c/f  worsening baseline opioid-induced constipation) and holding/reducing metformin d/t c/f "leg pain". Patient confirmed resuming metformin 1 week ago and denies adverse effects. However, he did not restart Ozempic as instructed due to fear of constipation. Only 3 days of BG data in the past few months due to issues with sensor staying on though per the limited data, sugars appear to be significantly uncontrolled with 3-day average sugar 286 mg/dL likely  secondary to stopping DM medications.  Increase Tresiba 10-20% (28 units daily = 16.6% increase) Start Ozempic 0.25 mg sq weekly  Continue metformin XR 500 mg daily Provided patient with resources/suggestions via MyChart to help Libre sensors stay on longer. Emphasize importance of BG monitoring in the setting of medication adjustment. Encouraged him to contact [employer] for replacement sensors.  Reviewed goal A1c, goal fasting, and goal 2 hour post prandial glucose  Future considerations: - Optimize metformin XR dose to 2000 mg daily (though patient extremely hesitant to use metformin) - Optimize Ozempic dose - SGLT2i is reasonable though consider initiation once glycemic control is slightly improved (volume depletion w severe hyperglycemia may exacerbate dehydration/constipation issue)   Constipation:  Well controlled on bisacodyl TID with BM every other day though he reports not starting Ozempic due to current level of constipation. We reviewed that generally, those on chronic opioids are recommended to use a stimulant and stool softener scheduled as to reduce OID. He confirms willingness to start using docusate scheduled daily instead of PRN. Encouraged to use bisacodyl + docusate scheduled especially if constipation worsens with restarting Ozempic Patient to schedule follow up with GI (previously canceled appointment for capsule motility study) Focus on staying well-hydrated and maintaining good dietary fiber intake.  Future consideration: - Appears Movantik is preferred on patient's formulary.  - Miralax - Patient reports low efficacy though was not taking regularly. May consider in the future. - PRN saline laxative reasonable for rare use if 4+ days without bowel movement (Milk of Mag, Mag Citrate, etc.)    Follow Up Plan:  Follow up with PharmD in 1 week via telephone check in to review CGM and continue to titrate insulin as needed.   Future Appointments  Date Time Provider Department  Center  08/16/2023 10:30 AM [facility]CM PHARMACIST [facility]  09/19/2023  9:30 AM Cameron Hyde, MD [facility]  02/29/2024 10:30 AM [facility] ANNUAL WELLNESS VISIT [facility]    Cameron Sanchez, PharmD Clinical Pharmacist [facility] [phone]

## 2023-08-17 DIAGNOSIS — L821 Other seborrheic keratosis: Secondary | ICD-10-CM | POA: Diagnosis not present

## 2023-08-17 DIAGNOSIS — D225 Melanocytic nevi of trunk: Secondary | ICD-10-CM | POA: Diagnosis not present

## 2023-08-17 DIAGNOSIS — X32XXXA Exposure to sunlight, initial encounter: Secondary | ICD-10-CM | POA: Diagnosis not present

## 2023-08-17 DIAGNOSIS — D2262 Melanocytic nevi of left upper limb, including shoulder: Secondary | ICD-10-CM | POA: Diagnosis not present

## 2023-08-17 DIAGNOSIS — D2271 Melanocytic nevi of right lower limb, including hip: Secondary | ICD-10-CM | POA: Diagnosis not present

## 2023-08-17 DIAGNOSIS — L578 Other skin changes due to chronic exposure to nonionizing radiation: Secondary | ICD-10-CM | POA: Diagnosis not present

## 2023-08-17 DIAGNOSIS — L820 Inflamed seborrheic keratosis: Secondary | ICD-10-CM | POA: Diagnosis not present

## 2023-08-17 DIAGNOSIS — L538 Other specified erythematous conditions: Secondary | ICD-10-CM | POA: Diagnosis not present

## 2023-08-17 DIAGNOSIS — D2272 Melanocytic nevi of left lower limb, including hip: Secondary | ICD-10-CM | POA: Diagnosis not present

## 2023-08-23 DIAGNOSIS — M7062 Trochanteric bursitis, left hip: Secondary | ICD-10-CM | POA: Diagnosis not present

## 2023-08-23 DIAGNOSIS — M7061 Trochanteric bursitis, right hip: Secondary | ICD-10-CM | POA: Diagnosis not present

## 2023-08-25 ENCOUNTER — Telehealth: Payer: Self-pay

## 2023-08-25 NOTE — Telephone Encounter (Signed)
Reached out to pt via MyChart about 2025 re-enrollment

## 2023-08-26 DIAGNOSIS — G894 Chronic pain syndrome: Secondary | ICD-10-CM | POA: Diagnosis not present

## 2023-08-26 DIAGNOSIS — M545 Low back pain, unspecified: Secondary | ICD-10-CM | POA: Diagnosis not present

## 2023-08-26 DIAGNOSIS — Z79891 Long term (current) use of opiate analgesic: Secondary | ICD-10-CM | POA: Diagnosis not present

## 2023-08-26 DIAGNOSIS — M961 Postlaminectomy syndrome, not elsewhere classified: Secondary | ICD-10-CM | POA: Diagnosis not present

## 2023-09-13 DIAGNOSIS — R238 Other skin changes: Secondary | ICD-10-CM | POA: Diagnosis not present

## 2023-09-13 DIAGNOSIS — B078 Other viral warts: Secondary | ICD-10-CM | POA: Diagnosis not present

## 2023-09-19 ENCOUNTER — Encounter: Payer: Self-pay | Admitting: Internal Medicine

## 2023-09-19 ENCOUNTER — Ambulatory Visit: Payer: Medicare HMO | Admitting: Internal Medicine

## 2023-09-19 NOTE — Assessment & Plan Note (Signed)
Saw GI (Dr Tobi Bastos) - 01/2023 - recommended MRI of the liver in 07/05/2023 to screen for [facility]. Requires hepatitis B vaccination - states receive first.  Continue f/u with GI. Plans to call for f/u.

## 2023-09-19 NOTE — Progress Notes (Deleted)
Subjective:    [name], male    DOB: 09-13-1971, 52 y.o.   MRN: [id number]  Patient here for No chief complaint on file.   HPI Here for a scheduled follow up - f/u regarding diabetes, hypercholesterolemia and hypertension. Has been on tresiba.  Ozempic was on hold due to constipation. Saw pharmacy 08/01/23 - was instructed to start back on ozempic .25mg . Controlling bowels with bisacodyl tid and colace. Also placed back on metformin 500mg .  He was off secondary to perceived leg pain.  Cameron Sanchez continues.  Reevaluated 08/09/23 - tresiba increased to 28 units. Instructed to start ozempic.    Past Medical History:  Diagnosis Date   Allergy    Anemia    Aortic atherosclerosis (HCC)    Chronic back pain    Chronic back pain    Cirrhosis of liver (HCC)    Complication of anesthesia    hard time to wake up x1   COVID-19    DM (diabetes mellitus), type 2 (HCC)    GERD (gastroesophageal reflux disease)    Gout    Hypercholesterolemia    Hypertension    Liver lesion    Migraines    Obesity    Sleep apnea    Subarachnoid hemorrhage (HCC) 11/2020   no surgery   Thrombocytopenia (HCC)    TIA (transient ischemic attack)    Past Surgical History:  Procedure Laterality Date   BACK SURGERY  08/12/2014   multiple   BOTOX INJECTION N/A 11/04/2022   Procedure: BOTOX INJECTION;  Surgeon: Leafy Ro, MD;  Location: [facility] ORS;  Service: General;  Laterality: N/A;   CHOLECYSTECTOMY     COLONOSCOPY WITH PROPOFOL N/A 03/11/2022   Procedure: COLONOSCOPY WITH PROPOFOL;  Surgeon: Wyline Mood, MD;  Location: [facility] ENDOSCOPY;  Service: Gastroenterology;  Laterality: N/A;   COLONOSCOPY WITH PROPOFOL N/A 11/17/2022   Procedure: COLONOSCOPY WITH PROPOFOL;  Surgeon: Wyline Mood, MD;  Location: [facility] ENDOSCOPY;  Service: Gastroenterology;  Laterality: N/A;   COLONOSCOPY WITH PROPOFOL N/A 11/24/2022   Procedure: COLONOSCOPY WITH PROPOFOL;  Surgeon: Wyline Mood, MD;  Location: [facility]  ENDOSCOPY;  Service: Gastroenterology;  Laterality: N/A;   ESOPHAGOGASTRODUODENOSCOPY N/A 03/11/2022   Procedure: ESOPHAGOGASTRODUODENOSCOPY (EGD);  Surgeon: Wyline Mood, MD;  Location: [facility] ENDOSCOPY;  Service: Gastroenterology;  Laterality: N/A;   KNEE ARTHROSCOPY Left    Dr Monica Martinez   SPHINCTEROTOMY N/A 11/04/2022   Procedure: SPHINCTEROTOMY;  Surgeon: Leafy Ro, MD;  Location: [facility] ORS;  Service: General;  Laterality: N/A;   Family History  Problem Relation Age of Onset   Cancer Father        germ cell (retroperitoneal)   Coronary artery disease Other        s/p stent (age 10)   Hypertension Paternal Grandfather    Kidney cancer Paternal Grandfather    Diabetes Paternal Grandfather    Diabetes Maternal Grandfather    Heart disease Cousin        MI - age 33   Social History   Socioeconomic History   Marital status: Married    Spouse name: Not on file   Number of children: 2   Years of education: 12   Highest education level: Not on file  Occupational History   Occupation: disabled    Employer: [employer]  Tobacco Use   Smoking status: Never   Smokeless tobacco: Never  Vaping Use   Vaping status: Never Used  Substance and Sexual Activity   Alcohol use:  No    Alcohol/week: 0.0 standard drinks of alcohol   Drug use: Never   Sexual activity: Not on file  Other Topics Concern   Not on file  Social History Narrative   Lives with girlfriend in a one story home.  Has 2 sons.  Trying to get disability due to several back issues.  Education: high school.    Social Determinants of Health   Financial Resource Strain: Low Risk  (02/28/2023)   Overall Financial Resource Strain (CARDIA)    Difficulty of Paying Living Expenses: Not hard at all  Food Insecurity: No Food Insecurity (02/28/2023)   Hunger Vital Sign    Worried About Running Out of Food in the Last Year: Never true    Ran Out of Food in the Last Year: Never true  Transportation Needs: No Transportation  Needs (02/28/2023)   PRAPARE - Administrator, Civil Service (Medical): No    Lack of Transportation (Non-Medical): No  Physical Activity: Not on file  Stress: No Stress Concern Present (02/28/2023)   [employer] of Occupational Health - Occupational Stress Questionnaire    Feeling of Stress : Not at all  Social Connections: Unknown (02/28/2023)   Social Connection and Isolation Panel [NHANES]    Frequency of Communication with Friends and Family: Not on file    Frequency of Social Gatherings with Friends and Family: Not on file    Attends Religious Services: Not on file    Active Member of Clubs or Organizations: Not on file    Attends Banker Meetings: Not on file    Marital Status: Married     Review of Systems     Objective:     There were no vitals taken for this visit. Wt Readings from Last 3 Encounters:  06/17/23 222 lb (100.7 kg)  04/20/23 217 lb 4 oz (98.5 kg)  03/17/23 215 lb 9.6 oz (97.8 kg)    Physical Exam   Outpatient Encounter Medications as of 09/19/2023  Medication Sig   allopurinol (ZYLOPRIM) 300 MG tablet Take 1 tablet (300 mg total) by mouth daily.   atorvastatin (LIPITOR) 40 MG tablet Take 1 tablet (40 mg total) by mouth daily.   bisacodyl (DULCOLAX) 5 MG EC tablet Take 5 mg by mouth daily as needed for moderate constipation.   Continuous Glucose Sensor (FREESTYLE LIBRE 3 SENSOR) MISC Place 1 sensor on the skin every 14 days. Use to check glucose continuously   insulin degludec (TRESIBA FLEXTOUCH) 200 UNIT/ML FlexTouch Pen Inject 24 Units into the skin daily.   Insulin Pen Needle (PEN NEEDLES) 32G X 4 MM MISC Use to inject medication daily   lactulose (CHRONULAC) 10 GM/15ML solution Take 30 mLs (20 g total) by mouth in the morning, at noon, and at bedtime.   metaxalone (SKELAXIN) 800 MG tablet Take 800 mg by mouth 3 (three) times daily.   metFORMIN (GLUCOPHAGE-XR) 500 MG 24 hr tablet Take 1 tablet (500 mg total) by mouth  daily with breakfast.   metoprolol succinate (TOPROL-XL) 50 MG 24 hr tablet TAKE 2 TABLETS BY MOUTH DAILY WITH OR IMMEDIATELY FOLLOWING A MEAL.   oxyCODONE (ROXICODONE) 15 MG immediate release tablet Take 15 mg by mouth every 4 (four) hours.   pantoprazole (PROTONIX) 40 MG tablet TAKE 1 TABLET (40 MG TOTAL) BY MOUTH TWICE A DAY BEFORE MEALS   sildenafil (REVATIO) 20 MG tablet Take 1-5 tablets 1 hour prior to intercourse as needed   triamterene-hydrochlorothiazide (MAXZIDE-25) 37.5-25 MG tablet  Take 1 tablet by mouth daily.   No facility-administered encounter medications on file as of 09/19/2023.     Lab Results  Component Value Date   WBC 7.5 06/17/2023   HGB 12.5 (L) 06/17/2023   HCT 39.2 06/17/2023   PLT 126.0 (L) 06/17/2023   GLUCOSE 232 (H) 06/17/2023   CHOL 129 06/17/2023   TRIG 195.0 (H) 06/17/2023   HDL 43.60 06/17/2023   LDLDIRECT 53.0 09/08/2022   LDLCALC 46 06/17/2023   ALT 29 06/17/2023   AST 35 06/17/2023   NA 134 (L) 06/17/2023   K 4.5 06/17/2023   CL 100 06/17/2023   CREATININE 0.92 06/17/2023   BUN 9 06/17/2023   CO2 28 06/17/2023   TSH 2.87 06/17/2023   PSA 0.41 06/17/2023   INR 1.1 02/03/2023   HGBA1C 8.8 (H) 06/17/2023   MICROALBUR <0.7 06/17/2023    MR LIVER W WO CONTRAST  Result Date: 12/21/2022 CLINICAL DATA:  52 year old male with history of cirrhosis and indeterminate liver lesion noted on prior examination. Follow-up study. EXAM: MRI ABDOMEN WITHOUT AND WITH CONTRAST TECHNIQUE: Multiplanar multisequence MR imaging of the abdomen was performed both before and after the administration of intravenous contrast. CONTRAST:  10mL GADAVIST GADOBUTROL 1 MMOL/ML IV SOLN COMPARISON:  Abdominal MRI 06/15/2022. FINDINGS: Lower chest: Unremarkable. Hepatobiliary: Diffuse loss of signal intensity throughout the hepatic parenchyma on out of phase dual echo images, indicative of a background of hepatic steatosis. Liver has a slightly shrunken appearance and nodular  contour, indicative of underlying cirrhosis. In segment 6 of the liver there is a well-defined 8 mm T1 hypointense, T2 hyperintense, nonenhancing lesion, compatible with a small simple cyst (no imaging follow-up recommended). 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 This is stable compared to the prior study. No other suspicious appearing hepatic lesions are noted. No intra or extrahepatic biliary ductal dilatation. Status post cholecystectomy. Pancreas: No pancreatic mass. No pancreatic ductal dilatation. No pancreatic or peripancreatic fluid collections or inflammatory changes. Spleen: Spleen is enlarged measuring 9.7 x 7.3 x 14.2 cm (estimated splenic volume of 503 mL). Adrenals/Urinary Tract: Bilateral kidneys and bilateral adrenal glands are normal in appearance. No hydroureteronephrosis in the visualized portions of the abdomen. Stomach/Bowel: Visualized portions are unremarkable. Vascular/Lymphatic: No aneurysm identified in the visualized abdominal vasculature. Portal vein is patent and normal in caliber measuring 12 mm. No lymphadenopathy noted in the abdomen. Other: No significant volume of ascites noted in the visualized portions of the peritoneal cavity. Musculoskeletal: No aggressive appearing osseous lesions are noted in the visualized portions of the skeleton. IMPRESSION: 1. Morphologic changes in the liver indicative of cirrhosis, with a background of mild hepatic steatosis. No suspicious hepatic lesions are noted on today's examination to suggest the presence of hepatocellular carcinoma. Continued clinical surveillance is recommended. 2. Small hepatic lesions including a tiny benign-appearing cyst and what appears to be a flash  fill cavernous hemangioma, as above. No imaging follow-up is recommended. 3. Splenomegaly. Electronically Signed   By: Trudie Reed M.D.   On: 12/21/2022 11:08       Assessment & Plan:  There are no diagnoses linked to this encounter.   Dale Half Moon, MD

## 2023-09-19 NOTE — Progress Notes (Signed)
[name], male   DOB: 19-Jan-1971, 52 y.o.   MRN: [id number] Did not show for appt.

## 2023-09-23 DIAGNOSIS — M5137 Other intervertebral disc degeneration, lumbosacral region with discogenic back pain only: Secondary | ICD-10-CM | POA: Diagnosis not present

## 2023-09-23 DIAGNOSIS — G894 Chronic pain syndrome: Secondary | ICD-10-CM | POA: Diagnosis not present

## 2023-09-23 DIAGNOSIS — M961 Postlaminectomy syndrome, not elsewhere classified: Secondary | ICD-10-CM | POA: Diagnosis not present

## 2023-09-23 DIAGNOSIS — Z79891 Long term (current) use of opiate analgesic: Secondary | ICD-10-CM | POA: Diagnosis not present

## 2023-09-23 DIAGNOSIS — M545 Low back pain, unspecified: Secondary | ICD-10-CM | POA: Diagnosis not present

## 2023-09-28 ENCOUNTER — Telehealth: Payer: Self-pay | Admitting: Internal Medicine

## 2023-09-28 DIAGNOSIS — E1149 Type 2 diabetes mellitus with other diabetic neurological complication: Secondary | ICD-10-CM

## 2023-09-28 MED ORDER — TRESIBA FLEXTOUCH 200 UNIT/ML ~~LOC~~ SOPN: 24.0000 [IU] | PEN_INJECTOR | Freq: Every day | SUBCUTANEOUS | 2 refills | Status: DC

## 2023-09-28 NOTE — Telephone Encounter (Signed)
Prescription Request  09/28/2023  LOV: 09/19/2023  What is the name of the medication or equipment? TRESIBA FLEXTOUCH   Have you contacted your pharmacy to request a refill? No   Which pharmacy would you like this sent to? [facility]   Patient notified that their request is being sent to the clinical staff for review and that they should receive a response within 2 business days.   Please advise at Mobile [phone] (mobile)

## 2023-09-28 NOTE — Telephone Encounter (Signed)
Cameron Sanchez sent in. Patient aware. Appt rescheduled with dr Lorin Picket

## 2023-10-10 NOTE — Telephone Encounter (Signed)
[facility] called and left HIPAA compliant voicemail last week.

## 2023-10-11 NOTE — Telephone Encounter (Signed)
Submitted On-line portion per Patient's verbal consent and faxed provider portion to office.

## 2023-10-18 ENCOUNTER — Ambulatory Visit: Payer: Medicare HMO | Admitting: Internal Medicine

## 2023-10-18 ENCOUNTER — Ambulatory Visit
Admission: RE | Admit: 2023-10-18 | Discharge: 2023-10-18 | Disposition: A | Discharge status: Home / Self Care | Payer: Medicare HMO | Source: Ambulatory Visit | Attending: Internal Medicine | Admitting: Internal Medicine

## 2023-10-18 ENCOUNTER — Ambulatory Visit
Admission: RE | Admit: 2023-10-18 | Discharge: 2023-10-18 | Disposition: A | Discharge status: Home / Self Care | Payer: Medicare HMO | Attending: Internal Medicine | Admitting: Internal Medicine

## 2023-10-18 VITALS — BP 138/72 | HR 70 | Temp 98.2°F | Resp 16 | Ht 68.0 in | Wt 221.0 lb

## 2023-10-18 DIAGNOSIS — G8929 Other chronic pain: Secondary | ICD-10-CM

## 2023-10-18 DIAGNOSIS — E611 Iron deficiency: Secondary | ICD-10-CM

## 2023-10-18 DIAGNOSIS — K7460 Unspecified cirrhosis of liver: Secondary | ICD-10-CM

## 2023-10-18 DIAGNOSIS — E1149 Type 2 diabetes mellitus with other diabetic neurological complication: Secondary | ICD-10-CM | POA: Diagnosis not present

## 2023-10-18 DIAGNOSIS — F439 Reaction to severe stress, unspecified: Secondary | ICD-10-CM

## 2023-10-18 DIAGNOSIS — R1084 Generalized abdominal pain: Secondary | ICD-10-CM | POA: Insufficient documentation

## 2023-10-18 DIAGNOSIS — R109 Unspecified abdominal pain: Secondary | ICD-10-CM | POA: Insufficient documentation

## 2023-10-18 DIAGNOSIS — D696 Thrombocytopenia, unspecified: Secondary | ICD-10-CM | POA: Diagnosis not present

## 2023-10-18 DIAGNOSIS — Z7984 Long term (current) use of oral hypoglycemic drugs: Secondary | ICD-10-CM

## 2023-10-18 DIAGNOSIS — E7800 Pure hypercholesterolemia, unspecified: Secondary | ICD-10-CM

## 2023-10-18 DIAGNOSIS — K5900 Constipation, unspecified: Secondary | ICD-10-CM

## 2023-10-18 DIAGNOSIS — K769 Liver disease, unspecified: Secondary | ICD-10-CM

## 2023-10-18 DIAGNOSIS — I878 Other specified disorders of veins: Secondary | ICD-10-CM | POA: Diagnosis not present

## 2023-10-18 DIAGNOSIS — I10 Essential (primary) hypertension: Secondary | ICD-10-CM

## 2023-10-18 DIAGNOSIS — I700 Atherosclerosis of aorta: Secondary | ICD-10-CM

## 2023-10-18 DIAGNOSIS — K219 Gastro-esophageal reflux disease without esophagitis: Secondary | ICD-10-CM

## 2023-10-18 DIAGNOSIS — M545 Low back pain, unspecified: Secondary | ICD-10-CM

## 2023-10-18 LAB — LIPID PANEL
Cholesterol: 167 mg/dL (ref 0–200)
HDL: 52.4 mg/dL (ref >39.00)
LDL Cholesterol: 89 mg/dL (ref 0–99)
NonHDL: 114.3
Total CHOL/HDL Ratio: 3
Triglycerides: 127 mg/dL (ref 0.0–149.0)
VLDL: 25.4 mg/dL (ref 0.0–40.0)

## 2023-10-18 LAB — CBC WITH DIFFERENTIAL/PLATELET
Basophils Absolute: 0 10*3/uL (ref 0.0–0.1)
Basophils Relative: 0.4 % (ref 0.0–3.0)
Eosinophils Absolute: 0.1 10*3/uL (ref 0.0–0.7)
Eosinophils Relative: 1.2 % (ref 0.0–5.0)
HCT: 43.1 % (ref 39.0–52.0)
Hemoglobin: 14 g/dL (ref 13.0–17.0)
Lymphocytes Relative: 30.6 % (ref 12.0–46.0)
Lymphs Abs: 3 10*3/uL (ref 0.7–4.0)
MCHC: 32.4 g/dL (ref 30.0–36.0)
MCV: 80.9 fL (ref 78.0–100.0)
Monocytes Absolute: 0.8 10*3/uL (ref 0.1–1.0)
Monocytes Relative: 8.3 % (ref 3.0–12.0)
Neutro Abs: 5.8 10*3/uL (ref 1.4–7.7)
Neutrophils Relative %: 59.5 % (ref 43.0–77.0)
Platelets: 154 10*3/uL (ref 150.0–400.0)
RBC: 5.34 Mil/uL (ref 4.22–5.81)
RDW: 16.8 % — ABNORMAL HIGH (ref 11.5–15.5)
WBC: 9.7 10*3/uL (ref 4.0–10.5)

## 2023-10-18 LAB — HEMOGLOBIN A1C: Hgb A1c MFr Bld: 10 % — ABNORMAL HIGH (ref 4.6–6.5)

## 2023-10-18 LAB — BASIC METABOLIC PANEL
BUN: 13 mg/dL (ref 6–23)
CO2: 27 meq/L (ref 19–32)
Calcium: 9.2 mg/dL (ref 8.4–10.5)
Chloride: 100 meq/L (ref 96–112)
Creatinine, Ser: 1 mg/dL (ref 0.40–1.50)
GFR: 85.36 mL/min (ref >60.00)
Glucose, Bld: 220 mg/dL — ABNORMAL HIGH (ref 70–99)
Potassium: 4.2 meq/L (ref 3.5–5.1)
Sodium: 132 meq/L — ABNORMAL LOW (ref 135–145)

## 2023-10-18 LAB — HEPATIC FUNCTION PANEL
ALT: 25 U/L (ref 0–53)
AST: 24 U/L (ref 0–37)
Albumin: 4.1 g/dL (ref 3.5–5.2)
Alkaline Phosphatase: 61 U/L (ref 39–117)
Bilirubin, Direct: 0.1 mg/dL (ref 0.0–0.3)
Total Bilirubin: 0.8 mg/dL (ref 0.2–1.2)
Total Protein: 7.2 g/dL (ref 6.0–8.3)

## 2023-10-18 NOTE — Progress Notes (Signed)
Subjective:    [name], male    DOB: [birth year], 52 y.o.   MRN: [id number]  Patient here for  Chief Complaint  Patient presents with   Medical Management of Chronic Issues    HPI Here for a follow up appt - f/u regarding his diabetes and hypercholesterolemia.  He has not been watching his diet. Not checking sugars. Not taking metformin. Taking tresiba. Drinking sweet tea - sometimes one gallon per day. Discussed diet and exercise. No chest pain reported. Breathing stable. Some abdominal discomfort. Problems with constipation. Discussed enema/suppository to get bowels stimulated. Had small bowel movement today. Taking stool softener and dulcolax. Increased pain.  Being followed by pain clinic. Frustrated with increased pain.  Seeing a Veterinary surgeon. No SI.    Past Medical History:  Diagnosis Date   Allergy    Anemia    Aortic atherosclerosis (HCC)    Chronic back pain    Chronic back pain    Cirrhosis of liver (HCC)    Complication of anesthesia    hard time to wake up x1   COVID-19    DM (diabetes mellitus), type 2 (HCC)    GERD (gastroesophageal reflux disease)    Gout    Hypercholesterolemia    Hypertension    Liver lesion    Migraines    Obesity    Sleep apnea    Subarachnoid hemorrhage (HCC) 11/2020   no surgery   Thrombocytopenia (HCC)    TIA (transient ischemic attack)    Past Surgical History:  Procedure Laterality Date   BACK SURGERY  08/12/2014   multiple   BOTOX INJECTION N/A 11/04/2022   Procedure: BOTOX INJECTION;  Surgeon: Leafy Ro, MD;  Location: [facility] ORS;  Service: General;  Laterality: N/A;   CHOLECYSTECTOMY     COLONOSCOPY WITH PROPOFOL N/A 03/11/2022   Procedure: COLONOSCOPY WITH PROPOFOL;  Surgeon: Wyline Mood, MD;  Location: [facility] ENDOSCOPY;  Service: Gastroenterology;  Laterality: N/A;   COLONOSCOPY WITH PROPOFOL N/A 11/17/2022   Procedure: COLONOSCOPY WITH PROPOFOL;  Surgeon: Wyline Mood, MD;  Location: [facility] ENDOSCOPY;   Service: Gastroenterology;  Laterality: N/A;   COLONOSCOPY WITH PROPOFOL N/A 11/24/2022   Procedure: COLONOSCOPY WITH PROPOFOL;  Surgeon: Wyline Mood, MD;  Location: [facility] ENDOSCOPY;  Service: Gastroenterology;  Laterality: N/A;   ESOPHAGOGASTRODUODENOSCOPY N/A 03/11/2022   Procedure: ESOPHAGOGASTRODUODENOSCOPY (EGD);  Surgeon: Wyline Mood, MD;  Location: [facility] ENDOSCOPY;  Service: Gastroenterology;  Laterality: N/A;   KNEE ARTHROSCOPY Left    Dr Monica Martinez   SPHINCTEROTOMY N/A 11/04/2022   Procedure: SPHINCTEROTOMY;  Surgeon: Leafy Ro, MD;  Location: [facility] ORS;  Service: General;  Laterality: N/A;   Family History  Problem Relation Age of Onset   Cancer Father        germ cell (retroperitoneal)   Coronary artery disease Other        s/p stent (age 20)   Hypertension Paternal Grandfather    Kidney cancer Paternal Grandfather    Diabetes Paternal Grandfather    Diabetes Maternal Grandfather    Heart disease Cousin        MI - age 9   Social History   Socioeconomic History   Marital status: Married    Spouse name: Not on file   Number of children: 2   Years of education: 12   Highest education level: Not on file  Occupational History   Occupation: disabled    Employer: [employer]  Tobacco Use   Smoking status: Never  Smokeless tobacco: Never  Vaping Use   Vaping status: Never Used  Substance and Sexual Activity   Alcohol use: No    Alcohol/week: 0.0 standard drinks of alcohol   Drug use: Never   Sexual activity: Not on file  Other Topics Concern   Not on file  Social History Narrative   Lives with girlfriend in a one story home.  Has 2 sons.  Trying to get disability due to several back issues.  Education: high school.    Social Determinants of Health   Financial Resource Strain: Low Risk  (02/28/2023)   Overall Financial Resource Strain (CARDIA)    Difficulty of Paying Living Expenses: Not hard at all  Food Insecurity: No Food Insecurity (02/28/2023)    Hunger Vital Sign    Worried About Running Out of Food in the Last Year: Never true    Ran Out of Food in the Last Year: Never true  Transportation Needs: No Transportation Needs (02/28/2023)   PRAPARE - Administrator, Civil Service (Medical): No    Lack of Transportation (Non-Medical): No  Physical Activity: Not on file  Stress: No Stress Concern Present (02/28/2023)   [employer] of Occupational Health - Occupational Stress Questionnaire    Feeling of Stress : Not at all  Social Connections: Unknown (02/28/2023)   Social Connection and Isolation Panel [NHANES]    Frequency of Communication with Friends and Family: Not on file    Frequency of Social Gatherings with Friends and Family: Not on file    Attends Religious Services: Not on file    Active Member of Clubs or Organizations: Not on file    Attends Banker Meetings: Not on file    Marital Status: Married     Review of Systems  Constitutional:  Negative for appetite change and unexpected weight change.  HENT:  Negative for congestion and sinus pressure.   Respiratory:  Negative for cough, chest tightness and shortness of breath.   Cardiovascular:  Negative for chest pain and palpitations.  Gastrointestinal:  Negative for abdominal pain, diarrhea, nausea and vomiting.  Genitourinary:  Negative for difficulty urinating and dysuria.  Musculoskeletal:  Positive for back pain. Negative for myalgias.  Skin:  Negative for color change and rash.  Neurological:  Negative for dizziness and headaches.  Psychiatric/Behavioral:  Negative for agitation.        Increased frustration as outlined.        Objective:     BP 138/72   Pulse 70   Temp 98.2 F (36.8 C)   Resp 16   Ht 5\' 8"  (1.727 m)   Wt 221 lb (100.2 kg)   SpO2 98%   BMI 33.60 kg/m  Wt Readings from Last 3 Encounters:  10/18/23 221 lb (100.2 kg)  06/17/23 222 lb (100.7 kg)  04/20/23 217 lb 4 oz (98.5 kg)    Physical Exam Vitals  reviewed.  Constitutional:      General: He is not in acute distress.    Appearance: Normal appearance. He is well-developed.  HENT:     Head: Normocephalic and atraumatic.     Right Ear: External ear normal.     Left Ear: External ear normal.  Eyes:     General: No scleral icterus.       Right eye: No discharge.        Left eye: No discharge.     Conjunctiva/sclera: Conjunctivae normal.  Cardiovascular:     Rate and Rhythm: Normal  rate and regular rhythm.  Pulmonary:     Effort: Pulmonary effort is normal. No respiratory distress.     Breath sounds: Normal breath sounds.  Abdominal:     General: Bowel sounds are normal.     Palpations: Abdomen is soft.     Tenderness: There is no abdominal tenderness.  Musculoskeletal:        General: No swelling or tenderness.     Cervical back: Neck supple. No tenderness.  Lymphadenopathy:     Cervical: No cervical adenopathy.  Skin:    Findings: No erythema or rash.  Neurological:     Mental Status: He is alert.  Psychiatric:        Mood and Affect: Mood normal.        Behavior: Behavior normal.      Outpatient Encounter Medications as of 10/18/2023  Medication Sig   allopurinol (ZYLOPRIM) 300 MG tablet Take 1 tablet (300 mg total) by mouth daily.   atorvastatin (LIPITOR) 40 MG tablet Take 1 tablet (40 mg total) by mouth daily.   bisacodyl (DULCOLAX) 5 MG EC tablet Take 5 mg by mouth daily as needed for moderate constipation.   Continuous Glucose Sensor (FREESTYLE LIBRE 3 SENSOR) MISC Place 1 sensor on the skin every 14 days. Use to check glucose continuously   insulin degludec (TRESIBA FLEXTOUCH) 200 UNIT/ML FlexTouch Pen Inject 24 Units into the skin daily.   Insulin Pen Needle (PEN NEEDLES) 32G X 4 MM MISC Use to inject medication daily   lactulose (CHRONULAC) 10 GM/15ML solution Take 30 mLs (20 g total) by mouth in the morning, at noon, and at bedtime.   metaxalone (SKELAXIN) 800 MG tablet Take 800 mg by mouth 3 (three) times  daily.   metFORMIN (GLUCOPHAGE-XR) 500 MG 24 hr tablet Take 1 tablet (500 mg total) by mouth daily with breakfast.   metoprolol succinate (TOPROL-XL) 50 MG 24 hr tablet TAKE 2 TABLETS BY MOUTH DAILY WITH OR IMMEDIATELY FOLLOWING A MEAL.   oxyCODONE (ROXICODONE) 15 MG immediate release tablet Take 15 mg by mouth every 4 (four) hours.   pantoprazole (PROTONIX) 40 MG tablet TAKE 1 TABLET (40 MG TOTAL) BY MOUTH TWICE A DAY BEFORE MEALS   sildenafil (REVATIO) 20 MG tablet Take 1-5 tablets 1 hour prior to intercourse as needed   triamterene-hydrochlorothiazide (MAXZIDE-25) 37.5-25 MG tablet Take 1 tablet by mouth daily.   No facility-administered encounter medications on file as of 10/18/2023.     Lab Results  Component Value Date   WBC 9.7 10/18/2023   HGB 14.0 10/18/2023   HCT 43.1 10/18/2023   PLT 154.0 10/18/2023   GLUCOSE 220 (H) 10/18/2023   CHOL 167 10/18/2023   TRIG 127.0 10/18/2023   HDL 52.40 10/18/2023   LDLDIRECT 53.0 09/08/2022   LDLCALC 89 10/18/2023   ALT 25 10/18/2023   AST 24 10/18/2023   NA 132 (L) 10/18/2023   K 4.2 10/18/2023   CL 100 10/18/2023   CREATININE 1.00 10/18/2023   BUN 13 10/18/2023   CO2 27 10/18/2023   TSH 2.87 06/17/2023   PSA 0.41 06/17/2023   INR 1.1 02/03/2023   HGBA1C 10.0 (H) 10/18/2023   MICROALBUR <0.7 06/17/2023    MR LIVER W WO CONTRAST  Result Date: 12/21/2022 CLINICAL DATA:  52 year old male with history of cirrhosis and indeterminate liver lesion noted on prior examination. Follow-up study. EXAM: MRI ABDOMEN WITHOUT AND WITH CONTRAST TECHNIQUE: Multiplanar multisequence MR imaging of the abdomen was performed both before and after the  administration of intravenous contrast. CONTRAST:  10mL GADAVIST GADOBUTROL 1 MMOL/ML IV SOLN COMPARISON:  Abdominal MRI 06/15/2022. FINDINGS: Lower chest: Unremarkable. Hepatobiliary: Diffuse loss of signal intensity throughout the hepatic parenchyma on out of phase dual echo images, indicative of a  background of hepatic steatosis. Liver has a slightly shrunken appearance and nodular contour, indicative of underlying cirrhosis. In segment 6 of the liver there is a well-defined 8 mm T1 hypointense, T2 hyperintense, nonenhancing lesion, compatible with a small simple cyst (no imaging follow-up recommended). 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 This is stable compared to the prior study. No other suspicious appearing hepatic lesions are noted. No intra or extrahepatic biliary ductal dilatation. Status post cholecystectomy. Pancreas: No pancreatic mass. No pancreatic ductal dilatation. No pancreatic or peripancreatic fluid collections or inflammatory changes. Spleen: Spleen is enlarged measuring 9.7 x 7.3 x 14.2 cm (estimated splenic volume of 503 mL). Adrenals/Urinary Tract: Bilateral kidneys and bilateral adrenal glands are normal in appearance. No hydroureteronephrosis in the visualized portions of the abdomen. Stomach/Bowel: Visualized portions are unremarkable. Vascular/Lymphatic: No aneurysm identified in the visualized abdominal vasculature. Portal vein is patent and normal in caliber measuring 12 mm. No lymphadenopathy noted in the abdomen. Other: No significant volume of ascites noted in the visualized portions of the peritoneal cavity. Musculoskeletal: No aggressive appearing osseous lesions are noted in the visualized portions of the skeleton. IMPRESSION: 1. Morphologic changes in the liver indicative of cirrhosis, with a background of mild hepatic steatosis. No suspicious hepatic lesions are noted on today's examination to suggest the presence of hepatocellular carcinoma. Continued clinical surveillance is recommended. 2. Small  hepatic lesions including a tiny benign-appearing cyst and what appears to be a flash fill cavernous hemangioma, as above. No imaging follow-up is recommended. 3. Splenomegaly. Electronically Signed   By: Trudie Reed M.D.   On: 12/21/2022 11:08       Assessment & Plan:  Generalized abdominal pain -     DG Abd 1 View; Future  Hypercholesterolemia Assessment & Plan: Was on lipitor. Low cholesterol diet and exercise.  Follow lipid panel and liver function tests.  Statin stopped while pursuing liver w/up.  Follow lipid panel.   Orders: -     Lipid panel -     Hepatic function panel  Thrombocytopenia (HCC) Assessment & Plan: Low platelet count - feel related to his underlying liver issues.  Follow cbc.    Orders: -     CBC with Differential/Platelet  Type 2 diabetes mellitus with neurological complications (HCC) Assessment & Plan: Have dscussed diet and exercise.  On tresiba - 24 units.  Check met b and A1c.  Did not tolerate .5mg  ozempic - increased constipation issues. Not taking metformin. Plans to start. Not watching his diet.  Discussed diet and exercise.   Orders: -     Basic metabolic panel -     Hemoglobin A1c  Cirrhosis of liver without ascites, unspecified hepatic cirrhosis type [facility]) Assessment & Plan: Saw GI (Dr Tobi Bastos) - 01/2023 - recommended MRI of the liver in 07/05/2023 to screen for [facility]. Requires hepatitis B vaccination - states receive first.  Continue f/u with GI. Discussed the need to follow up.   Orders: -  Hepatic function panel  Stress Assessment & Plan: Increased stress/frustration related to his pain issues.  Seeing pain clinic.    Liver lesion Assessment & Plan: Saw GI (Dr Tobi Bastos) - 01/2023 - recommended MRI of the liver in 07/05/2023 to screen for [facility].  Still has not followed up with GI.  Discussed.    Iron deficiency Assessment & Plan: Dr Tobi Bastos 01/2023 - recommended - Capsule study of the small bowel in view of iron deficiency. EGD to screen for  esophageal varices in April 2026. Oral iron ferrous sulfate 325 mg 1 tablet every other day to be continued.  Colonoscopy 2025. Did not follow up.  Discussed need to follow up.    Primary hypertension Assessment & Plan: Blood pressure doing well.  Continue triam/hctz and metoprolol.  Follow pressures.  Follow metabolic panel.    Gastroesophageal reflux disease, unspecified whether esophagitis present Assessment & Plan: On protonix.  Follow.    Constipation, unspecified constipation type Assessment & Plan: Takes narcotic pain medication. Have discussed bowel regimen - to keep bowels moving.  Saw GI.  Colonoscopy 03/11/22 - Four 2 to 3 mm polyps in the cecum, removed with a cold biopsy forceps. Resected and retrieved. Two 4 to 6 mm polyps in the cecum, removed with a cold snare. Resected and retrieved. Three 5 to 7 mm polyps in the ascending colon, removed with a cold snare. Resected and retrieved. One 15 mm polyp in the ascending colon, removed with mucosal resection. Resected and retrieved. Clip was placed. The examination was otherwise normal on direct and retroflexion views. Mucosal resection was performed. Resection and retrieval were complete. Continue f/u with GI.  Recommended f/u colonoscopy 6-8 months. Colonoscopy 11/2022 - normal.  Recommended f/u in one year. Has not follow up. Discussed. Small bowel movement today. Taking stool softener and dulcolax. Discussed suppository/enema to stimulate bowels.  Follow.    Chronic low back pain, unspecified back pain laterality, unspecified whether sciatica present Assessment & Plan: Followed by pain clinic.  On oxycodone.  Discussed the need to keep bowel moving.  Discussed bowel regimen.  Has f/u planned this week.    Aortic atherosclerosis (HCC) Assessment & Plan: Lipitor.        Dale Maywood, MD

## 2023-10-21 DIAGNOSIS — M545 Low back pain, unspecified: Secondary | ICD-10-CM | POA: Diagnosis not present

## 2023-10-21 DIAGNOSIS — M961 Postlaminectomy syndrome, not elsewhere classified: Secondary | ICD-10-CM | POA: Diagnosis not present

## 2023-10-21 DIAGNOSIS — Z79891 Long term (current) use of opiate analgesic: Secondary | ICD-10-CM | POA: Diagnosis not present

## 2023-10-21 DIAGNOSIS — G894 Chronic pain syndrome: Secondary | ICD-10-CM | POA: Diagnosis not present

## 2023-10-21 DIAGNOSIS — M5137 Other intervertebral disc degeneration, lumbosacral region with discogenic back pain only: Secondary | ICD-10-CM | POA: Diagnosis not present

## 2023-10-22 ENCOUNTER — Encounter: Payer: Self-pay | Admitting: Internal Medicine

## 2023-10-22 NOTE — Assessment & Plan Note (Signed)
On protonix.  Follow.  

## 2023-10-22 NOTE — Assessment & Plan Note (Signed)
Saw GI (Dr Tobi Bastos) - 01/2023 - recommended MRI of the liver in 07/05/2023 to screen for [facility].  Still has not followed up with GI.  Discussed.

## 2023-10-22 NOTE — Assessment & Plan Note (Signed)
Takes narcotic pain medication. Have discussed bowel regimen - to keep bowels moving.  Saw GI.  Colonoscopy 03/11/22 - Four 2 to 3 mm polyps in the cecum, removed with a cold biopsy forceps. Resected and retrieved. Two 4 to 6 mm polyps in the cecum, removed with a cold snare. Resected and retrieved. Three 5 to 7 mm polyps in the ascending colon, removed with a cold snare. Resected and retrieved. One 15 mm polyp in the ascending colon, removed with mucosal resection. Resected and retrieved. Clip was placed. The examination was otherwise normal on direct and retroflexion views. Mucosal resection was performed. Resection and retrieval were complete. Continue f/u with GI.  Recommended f/u colonoscopy 6-8 months. Colonoscopy 11/2022 - normal.  Recommended f/u in one year. Has not follow up. Discussed. Small bowel movement today. Taking stool softener and dulcolax. Discussed suppository/enema to stimulate bowels.  Follow.

## 2023-10-22 NOTE — Assessment & Plan Note (Signed)
Low platelet count - feel related to his underlying liver issues.  Follow cbc.   

## 2023-10-22 NOTE — Assessment & Plan Note (Signed)
Saw GI (Dr Tobi Bastos) - 01/2023 - recommended MRI of the liver in 07/05/2023 to screen for [facility]. Requires hepatitis B vaccination - states receive first.  Continue f/u with GI. Discussed the need to follow up.

## 2023-10-22 NOTE — Assessment & Plan Note (Signed)
Increased stress/frustration related to his pain issues.  Seeing pain clinic.

## 2023-10-22 NOTE — Assessment & Plan Note (Signed)
Have dscussed diet and exercise.  On tresiba - 24 units.  Check met b and A1c.  Did not tolerate .5mg  ozempic - increased constipation issues. Not taking metformin. Plans to start. Not watching his diet.  Discussed diet and exercise.

## 2023-10-22 NOTE — Assessment & Plan Note (Signed)
Dr Tobi Bastos 01/2023 - recommended - Capsule study of the small bowel in view of iron deficiency. EGD to screen for esophageal varices in April 2026. Oral iron ferrous sulfate 325 mg 1 tablet every other day to be continued.  Colonoscopy 2025. Did not follow up.  Discussed need to follow up.

## 2023-10-22 NOTE — Assessment & Plan Note (Signed)
Blood pressure doing well.  Continue triam/hctz and metoprolol.  Follow pressures.  Follow metabolic panel.

## 2023-10-22 NOTE — Assessment & Plan Note (Signed)
Followed by pain clinic.  On oxycodone.  Discussed the need to keep bowel moving.  Discussed bowel regimen.  Has f/u planned this week.

## 2023-10-22 NOTE — Assessment & Plan Note (Signed)
Lipitor 

## 2023-10-22 NOTE — Assessment & Plan Note (Signed)
Was on lipitor. Low cholesterol diet and exercise.  Follow lipid panel and liver function tests.  Statin stopped while pursuing liver w/up.  Follow lipid panel.

## 2023-10-24 ENCOUNTER — Telehealth: Payer: Self-pay | Admitting: Internal Medicine

## 2023-10-24 DIAGNOSIS — F1199 Opioid use, unspecified with unspecified opioid-induced disorder: Secondary | ICD-10-CM | POA: Diagnosis not present

## 2023-10-24 NOTE — Telephone Encounter (Signed)
Pt would like to be called concerning a referral

## 2023-10-25 ENCOUNTER — Telehealth: Payer: Self-pay

## 2023-10-25 ENCOUNTER — Other Ambulatory Visit: Payer: Self-pay

## 2023-10-25 DIAGNOSIS — E1149 Type 2 diabetes mellitus with other diabetic neurological complication: Secondary | ICD-10-CM

## 2023-10-25 NOTE — Telephone Encounter (Signed)
Patient called and would a call back regarding his referral. Please call patient back at  [phone]

## 2023-10-25 NOTE — Telephone Encounter (Signed)
Cameron Sanchez, I am confused. See me about this. He mentioned to me that he was concerned about his medication, etc. He said he had a f/u with pain clinic last Friday and was going to talk to them about his pain and concerns.  I was not aware that he wanted a referral to another pain clinic.  What clinic does he prefer?

## 2023-10-25 NOTE — Telephone Encounter (Signed)
Patient was calling to let you know that he would like a referral to a new pain management clinic. He says this was discussed at last appt.  [facility] Fax [phone]  Ok to refer?  He is not expecting a call back today.

## 2023-10-25 NOTE — Progress Notes (Signed)
   Care Guide Note  10/25/2023 Name: Cameron Sanchez MRN: [id number] DOB: 01-06-1971  Referred by: Dale Pennington, MD Reason for referral : Care Coordination (Outreach to schedule with Pharm d )   Cameron Sanchez is a 52 y.o. year old male who is a primary care patient of Dale Vienna, MD. Cameron Sanchez was referred to the pharmacist for assistance related to DM.    Successful contact was made with the patient to discuss pharmacy services including being ready for the pharmacist to call at least 5 minutes before the scheduled appointment time, to have medication bottles and any blood sugar or blood pressure readings ready for review. The patient agreed to meet with the pharmacist via with the pharmacist via telephone visit on (date/time).  10/28/2023  Penne Lash , RMA     [place]  [facility] Guide  Direct Dial: [phone]  Website: [email or web address]

## 2023-10-26 DIAGNOSIS — F1199 Opioid use, unspecified with unspecified opioid-induced disorder: Secondary | ICD-10-CM | POA: Diagnosis not present

## 2023-10-26 NOTE — Telephone Encounter (Signed)
He did see his pain doctor and they would not give him anymore medication until 12/17 because he ran out early. He said he was try to get approved for a back stimulator but has not heard anything. He is asking for a referral to:  [facility] Fax: [phone]  I will place referral if ok with you

## 2023-10-26 NOTE — Telephone Encounter (Signed)
ok 

## 2023-10-27 ENCOUNTER — Other Ambulatory Visit: Payer: Self-pay

## 2023-10-27 DIAGNOSIS — G8929 Other chronic pain: Secondary | ICD-10-CM

## 2023-10-27 NOTE — Telephone Encounter (Signed)
Submitted Patient portion to [employer] with Insurance card.

## 2023-10-28 ENCOUNTER — Other Ambulatory Visit: Payer: Medicare HMO | Admitting: Pharmacist

## 2023-10-28 NOTE — Progress Notes (Signed)
10/28/2023 Name: Cameron Sanchez MRN: [id number] DOB: 02-13-1971  Chief Complaint  Patient presents with   Diabetes    Cameron Sanchez is a 52 y.o. year old male who presented for a telephone visit.   They were referred to the pharmacist by their PCP for assistance in managing diabetes.   Subjective:  Last diabetes-related Visit: 08/09/23 with Clinical Pharmacist; 10/18/23 PCP  At last visit:  9/24: ?? Tresiba 24?28 units (16.6%); Restart Ozempic 0.25 mg, Increase metformin XR back to 500 mg daily, Continue Tresiba 24 units daily; Encouraged to use bisacodyl + docusate scheduled especially if constipation worsens with restarting Ozempic   Care Team: Primary Care Provider: Dale [place], MD   Medication Access/Adherence Patient reports affordability concerns with their medications: No  Patient reports access/transportation concerns to their pharmacy: No  Diabetes: Current medications:  metformin XR  (Reports restarted 12/6 though taking 500 mg daily) Tresiba 28 units daily  Since last visit, patient reports partially implementing plan. Continues to report good adherence with [name], confirms increasing to 48 units. Seems to have stopped taking metformin altogether again due to low motivation w other stressors in life currently (reports related to his chronic pain/pain management clinic).   Previous report of apprehension to take metformin due to colleagues stories of how metformin caused them leg pain and reduced sexual drive.   Never resumed Ozempic due to constipation despite regular bowel movements reported (about every other day). Also never increased bowel regimen to scheduled use as discussed despite long discussion that constipation is expected with chronic opioids, especially if not taking consistent bowel regimen.    SMBG: Libre 3 (Linked) No CGM data since October. Denies issues with cost/access and reports he took a break from wearing as he felt that other life  stressors were top priority for him (pain mgmt). Reports awareness of refills at pharmacy.   Objective:  Lab Results  Component Value Date   HGBA1C 10.0 (H) 10/18/2023   HGBA1C 8.8 (H) 06/17/2023   HGBA1C 7.5 (H) 04/08/2023   HGBA1C 9.1 (H) 12/31/2022    Lab Results  Component Value Date   CREATININE 1.00 10/18/2023   BUN 13 10/18/2023   NA 132 (L) 10/18/2023   K 4.2 10/18/2023   CL 100 10/18/2023   CO2 27 10/18/2023    Lab Results  Component Value Date   CHOL 167 10/18/2023   HDL 52.40 10/18/2023   LDLCALC 89 10/18/2023   LDLDIRECT 53.0 09/08/2022   TRIG 127.0 10/18/2023   CHOLHDL 3 10/18/2023    Medications Reviewed Today   Medications were not reviewed in this encounter     Assessment & Plan   Diabetes: uncontrolled with A1c 10.0%, increased from 8.8% (06/17/23) secondary to medication non-compliance and diet. Never restarted Ozempic or metformin. Not wearing CGM or checking sugars. Reports priority has been pain management over other medications the past few months.  Reported regimen: Tresiba 28 units daily, metformin XR 500 mg (started within the past week, not taking 2 tablets as instructed by PCP) Increase metformin XR 500?1000 mg daily Emphasized importance of BG monitoring in the setting of medication adjustment. Patient agreeable to getting sensors from the pharmacy within the next week.  Reviewed goal A1c, goal fasting, and goal 2 hour post prandial glucose  Future considerations: - Optimize metformin XR dose to 2000 mg daily (though patient extremely hesitant to use metformin) - Ozempic (patient has declined previously d/t c/f constipation. Long discussions had regarding chronic opioid use and need  for scheduled bowel regimen of stool softener AND stimulant).  - SGLT2i is reasonable though consider initiation once glycemic control is slightly improved (volume depletion w severe hyperglycemia may exacerbate dehydration/constipation issue)  Follow Up Plan:   Follow up with PharmD in 1-2 week via telephone check in to review CGM and continue to titrate insulin as needed.   Future Appointments  Date Time Provider Department Center  11/07/2023 11:00 AM [facility] PHARMACIST [facility]  01/16/2024  2:00 PM Dale [place], MD [facility]  02/29/2024 10:30 AM [facility] ANNUAL WELLNESS VISIT [facility]   Loree Fee, PharmD Clinical Pharmacist [facility] [phone]

## 2023-10-28 NOTE — Patient Instructions (Signed)
[name],   It was a pleasure to speak with you today! As we discussed:?   Diabetes Medication Increase metformin XR to two tablets daily (1000 mg total) Continue Tresiba 28 units under the skin once daily. We will plan to increase your dose once you have your sensor refills from the pharmacy.   What If My [employer] Early or Isn't Working? Call [employer] Customer Care Team at [phone] for a free replacement sensor  Available 7 days a week from 8AM-8PM EST, excluding holidays There are some strategies that can help to keep the sensors on.   Make sure you don't have lotion on your arms/abdomen before applying the sensor.  Make sure your skin is clean and dry before applying a new sensor. You can go to your local pharmacy and ask for Tegaderm, or something similar.  It is a clear, waterproof patch that you can lay over the sensor to help it to stay on longer. Or you can order patches from [employer] (or elsewhere online). An example is "grif grips" at [email or web address].  Try searching "Libre patch" on [employer]. Be sure to call [facility] and ask for replacements for the sensors that have fallen off.  They are usually pretty good about replacing at least some of the lost sensors.   Constipation: Continue taking bisacodyl three times daily as you have been (solid orange pill) It is generally recommended to use docusate daily when using chronic opioid medication Start using your stool softener pill (docusate) 1-2 times daily every day to prevent any worsening in constipation as you resume your Ozempic.  Focus on remaining well hydrated and consider increasing dietary fiber.    Please reach out sooner via MyChart or by clinic phone if any concerns. Future Appointments  Date Time Provider Department Center  11/07/2023 11:00 AM [facility] PHARMACIST [facility]  01/16/2024  2:00 PM Dale [place], MD [facility]  02/29/2024 10:30 AM [facility] ANNUAL WELLNESS VISIT [facility]    Berenice Primas, PharmD - Clinical Pharmacist

## 2023-10-31 DIAGNOSIS — F1199 Opioid use, unspecified with unspecified opioid-induced disorder: Secondary | ICD-10-CM | POA: Diagnosis not present

## 2023-11-01 ENCOUNTER — Telehealth: Payer: Self-pay | Admitting: Internal Medicine

## 2023-11-01 DIAGNOSIS — M545 Low back pain, unspecified: Secondary | ICD-10-CM | POA: Diagnosis not present

## 2023-11-01 DIAGNOSIS — G894 Chronic pain syndrome: Secondary | ICD-10-CM | POA: Diagnosis not present

## 2023-11-01 DIAGNOSIS — M961 Postlaminectomy syndrome, not elsewhere classified: Secondary | ICD-10-CM | POA: Diagnosis not present

## 2023-11-01 DIAGNOSIS — Z79891 Long term (current) use of opiate analgesic: Secondary | ICD-10-CM | POA: Diagnosis not present

## 2023-11-01 NOTE — Telephone Encounter (Signed)
Patient brought in xray copies that he wanted to be fax to [facility]. Images were fax to [phone] on 11/01/2023.

## 2023-11-01 NOTE — Telephone Encounter (Signed)
Unsigned portion of provider page was faxed to [employer], resent signed provider page

## 2023-11-02 DIAGNOSIS — F1199 Opioid use, unspecified with unspecified opioid-induced disorder: Secondary | ICD-10-CM | POA: Diagnosis not present

## 2023-11-07 ENCOUNTER — Other Ambulatory Visit: Payer: Medicare HMO

## 2023-11-07 ENCOUNTER — Other Ambulatory Visit: Payer: Self-pay | Admitting: Pharmacist

## 2023-11-07 DIAGNOSIS — F1199 Opioid use, unspecified with unspecified opioid-induced disorder: Secondary | ICD-10-CM | POA: Diagnosis not present

## 2023-11-07 DIAGNOSIS — E1149 Type 2 diabetes mellitus with other diabetic neurological complication: Secondary | ICD-10-CM

## 2023-11-07 MED ORDER — FREESTYLE LIBRE 3 PLUS SENSOR MISC: 5 refills | Status: DC

## 2023-11-07 NOTE — Progress Notes (Signed)
Attempted to contact patient for scheduled appointment for medication management. Left HIPAA compliant message for patient to return my call at their convenience.   Call was scheduled to review Cameron Sanchez data though appears based on provider portal that patient has still not refilled his sensors.  As to avoid further issues with adherence, refill sent to pharmacy for [facility] 3 plus sensors given Libre 3 sensors are becoming difficult to get (no longer made).

## 2023-11-07 NOTE — Progress Notes (Deleted)
11/07/2023 Name: Cameron Sanchez MRN: [id number] DOB: 1971-09-18  No chief complaint on file.   Cameron Sanchez is a 52 y.o. year old male who presented for a telephone visit.   They were referred to the pharmacist by their PCP for assistance in managing diabetes.   Last diabetes-related Visit: 10/28/23 with Clinical Pharmacist; 10/18/23 PCP At last visit:  12/13: Not checking sugars. Only taking 500 mg metformin. Increase to 1000 mg metformin/day. Agreeable to p/u [place] sensors.  12/3: A1c 8.8%?10% (medication non-compliance/diet). Not taking metformin or Ozempic. Resume metformin.  9/24: ?? Tresiba 24?28 units (16.6%); Restart Ozempic 0.25 mg, Increase metformin XR back to 500 mg daily, Continue Tresiba 24 units daily; Encouraged to use bisacodyl + docusate scheduled especially if constipation worsens with restarting Ozempic   Care Team: Primary Care Provider: Dale [place], MD    Subjective:  Medication Access/Adherence Patient reports affordability concerns with their medications: No  Patient reports access/transportation concerns to their pharmacy: No  Diabetes: Current medications:  metformin XR  (Reports restarted 12/6 though taking 500 mg daily) Tresiba 28 units daily  Since last visit, patient reports partially implementing plan. Continues to report good adherence with [name], confirms increasing to 48 units. Seems to have stopped taking metformin altogether again due to low motivation w other stressors in life currently (reports related to his chronic pain/pain management clinic).   Previous report of apprehension to take metformin due to colleagues stories of how metformin caused them leg pain and reduced sexual drive.   Never resumed Ozempic due to constipation despite regular bowel movements reported (about every other day). Also never increased bowel regimen to scheduled use as discussed despite long discussion that constipation is expected with chronic opioids,  especially if not taking consistent bowel regimen.    SMBG: Libre 3 (Linked) No CGM data since October. Denies issues with cost/access and reports he took a break from wearing as he felt that other life stressors were top priority for him (pain mgmt). Reports awareness of refills at pharmacy.   Objective:  Lab Results  Component Value Date   HGBA1C 10.0 (H) 10/18/2023   HGBA1C 8.8 (H) 06/17/2023   HGBA1C 7.5 (H) 04/08/2023   HGBA1C 9.1 (H) 12/31/2022    Lab Results  Component Value Date   CREATININE 1.00 10/18/2023   BUN 13 10/18/2023   NA 132 (L) 10/18/2023   K 4.2 10/18/2023   CL 100 10/18/2023   CO2 27 10/18/2023    Lab Results  Component Value Date   CHOL 167 10/18/2023   HDL 52.40 10/18/2023   LDLCALC 89 10/18/2023   LDLDIRECT 53.0 09/08/2022   TRIG 127.0 10/18/2023   CHOLHDL 3 10/18/2023    Medications Reviewed Today   Medications were not reviewed in this encounter     Assessment & Plan   Diabetes: uncontrolled with A1c 10.0%, increased from 8.8% (06/17/23) secondary to medication non-compliance and diet. Never restarted Ozempic or metformin. Not wearing CGM or checking sugars. Reports priority has been pain management over other medications the past few months.  Reported regimen: Tresiba 28 units daily, metformin XR 500 mg (started within the past week, not taking 2 tablets as instructed by PCP) Increase metformin XR 500?1000 mg daily Emphasized importance of BG monitoring in the setting of medication adjustment. Patient agreeable to getting sensors from the pharmacy within the next week.  Reviewed goal A1c, goal fasting, and goal 2 hour post prandial glucose  Future considerations: - Optimize metformin XR dose to  2000 mg daily (though patient extremely hesitant to use metformin) - Ozempic (patient has declined previously d/t c/f constipation. Long discussions had regarding chronic opioid use and need for scheduled bowel regimen of stool softener AND stimulant).   - SGLT2i is reasonable though consider initiation once glycemic control is slightly improved (volume depletion w severe hyperglycemia may exacerbate dehydration/constipation issue)  Follow Up Plan:  Follow up with PharmD in 1-2 week via telephone check in to review CGM and continue to titrate insulin as needed.   Future Appointments  Date Time Provider Department Center  11/07/2023 11:00 AM [facility] PHARMACIST [facility]  01/16/2024  2:00 PM Dale [place], MD [facility]  02/29/2024 10:30 AM [facility] ANNUAL WELLNESS VISIT [facility]   Loree Fee, PharmD Clinical Pharmacist [facility] [phone]

## 2023-11-10 DIAGNOSIS — F1199 Opioid use, unspecified with unspecified opioid-induced disorder: Secondary | ICD-10-CM | POA: Diagnosis not present

## 2023-11-14 ENCOUNTER — Encounter: Payer: Self-pay | Admitting: Pharmacist

## 2023-11-14 ENCOUNTER — Other Ambulatory Visit: Payer: Medicare HMO

## 2023-11-14 DIAGNOSIS — F1199 Opioid use, unspecified with unspecified opioid-induced disorder: Secondary | ICD-10-CM | POA: Diagnosis not present

## 2023-11-14 NOTE — Progress Notes (Deleted)
11/14/2023 Name: Cameron Sanchez MRN: [id number] DOB: 07/13/1971  No chief complaint on file.   Cameron Sanchez is a 52 y.o. year old male who presented for a telephone visit.   They were referred to the pharmacist by their PCP for assistance in managing diabetes.   Last diabetes-related Visit: 10/28/23 with Clinical Pharmacist; 10/18/23 PCP At last visit:  12/13: Not checking sugars. Only taking 500 mg metformin. Increase to 1000 mg metformin/day. Agreeable to p/u [place] sensors.  12/3: A1c 8.8%?10% (medication non-compliance/diet). Not taking metformin or Ozempic. Resume metformin.  9/24: ?? Tresiba 24?28 units (16.6%); Restart Ozempic 0.25 mg, Increase metformin XR back to 500 mg daily, Continue Tresiba 24 units daily; Encouraged to use bisacodyl + docusate scheduled especially if constipation worsens with restarting Ozempic   Care Team: Primary Care Provider: Dale [place], MD    Subjective:  Medication Access/Adherence Patient reports affordability concerns with their medications: No  Patient reports access/transportation concerns to their pharmacy: No  Diabetes: Current medications:  metformin XR  (Reports restarted 12/6 though taking 500 mg daily) Tresiba 28 units daily  Since last visit, patient reports partially implementing plan. Continues to report good adherence with [name], confirms increasing to 48 units. Seems to have stopped taking metformin altogether again due to low motivation w other stressors in life currently (reports related to his chronic pain/pain management clinic).   Previous report of apprehension to take metformin due to colleagues stories of how metformin caused them leg pain and reduced sexual drive.   Never resumed Ozempic due to constipation despite regular bowel movements reported (about every other day). Also never increased bowel regimen to scheduled use as discussed despite long discussion that constipation is expected with chronic opioids,  especially if not taking consistent bowel regimen.    SMBG: Libre 3 (Linked) No CGM data since October. Denies issues with cost/access and reports he took a break from wearing as he felt that other life stressors were top priority for him (pain mgmt). Reports awareness of refills at pharmacy.   Objective:  Lab Results  Component Value Date   HGBA1C 10.0 (H) 10/18/2023   HGBA1C 8.8 (H) 06/17/2023   HGBA1C 7.5 (H) 04/08/2023   HGBA1C 9.1 (H) 12/31/2022    Lab Results  Component Value Date   CREATININE 1.00 10/18/2023   BUN 13 10/18/2023   NA 132 (L) 10/18/2023   K 4.2 10/18/2023   CL 100 10/18/2023   CO2 27 10/18/2023    Lab Results  Component Value Date   CHOL 167 10/18/2023   HDL 52.40 10/18/2023   LDLCALC 89 10/18/2023   LDLDIRECT 53.0 09/08/2022   TRIG 127.0 10/18/2023   CHOLHDL 3 10/18/2023    Medications Reviewed Today   Medications were not reviewed in this encounter     Assessment & Plan   Diabetes: uncontrolled with A1c 10.0%, increased from 8.8% (06/17/23) secondary to medication non-compliance and diet. Never restarted Ozempic or metformin. Not wearing CGM or checking sugars. Reports priority has been pain management over other medications the past few months.  Reported regimen: Tresiba 28 units daily, metformin XR 500 mg (started within the past week, not taking 2 tablets as instructed by PCP) Increase metformin XR 500?1000 mg daily Emphasized importance of BG monitoring in the setting of medication adjustment. Patient agreeable to getting sensors from the pharmacy within the next week.  Reviewed goal A1c, goal fasting, and goal 2 hour post prandial glucose  Future considerations: - Optimize metformin XR dose to  2000 mg daily (though patient extremely hesitant to use metformin) - Ozempic (patient has declined previously d/t c/f constipation. Long discussions had regarding chronic opioid use and need for scheduled bowel regimen of stool softener AND stimulant).   - SGLT2i is reasonable though consider initiation once glycemic control is slightly improved (volume depletion w severe hyperglycemia may exacerbate dehydration/constipation issue)  Follow Up Plan:  Follow up with PharmD in 1-2 week via telephone check in to review CGM and continue to titrate insulin as needed.   Future Appointments  Date Time Provider Department Center  11/14/2023 11:00 AM [facility]CM PHARMACIST [facility]  01/16/2024  2:00 PM Dale [place], MD [facility]  02/29/2024 10:30 AM [facility] ANNUAL WELLNESS VISIT [facility]   Loree Fee, PharmD Clinical Pharmacist [facility] [phone]

## 2023-11-14 NOTE — Progress Notes (Signed)
Attempted to contact patient for scheduled appointment for medication management. Left HIPAA compliant message for patient to return my call at their convenience.   

## 2023-11-15 NOTE — Telephone Encounter (Signed)
 PAP: Patient assistance application for Tresiba  has been approved by PAP Companies: [employer] from 11/16/2023 to 11/14/2024. Medication should be delivered to PAP Delivery: Provider's office For further shipping updates, please contact [employer] at [phone] Pt ID is: [id number]

## 2023-11-17 DIAGNOSIS — F1199 Opioid use, unspecified with unspecified opioid-induced disorder: Secondary | ICD-10-CM | POA: Diagnosis not present

## 2023-11-18 ENCOUNTER — Other Ambulatory Visit: Payer: Self-pay | Admitting: Internal Medicine

## 2023-11-18 DIAGNOSIS — M5137 Other intervertebral disc degeneration, lumbosacral region with discogenic back pain only: Secondary | ICD-10-CM | POA: Diagnosis not present

## 2023-11-18 DIAGNOSIS — M961 Postlaminectomy syndrome, not elsewhere classified: Secondary | ICD-10-CM | POA: Diagnosis not present

## 2023-11-18 DIAGNOSIS — M545 Low back pain, unspecified: Secondary | ICD-10-CM | POA: Diagnosis not present

## 2023-11-18 DIAGNOSIS — Z79891 Long term (current) use of opiate analgesic: Secondary | ICD-10-CM | POA: Diagnosis not present

## 2023-11-18 DIAGNOSIS — G894 Chronic pain syndrome: Secondary | ICD-10-CM | POA: Diagnosis not present

## 2023-11-21 DIAGNOSIS — F1199 Opioid use, unspecified with unspecified opioid-induced disorder: Secondary | ICD-10-CM | POA: Diagnosis not present

## 2023-11-22 DIAGNOSIS — L538 Other specified erythematous conditions: Secondary | ICD-10-CM | POA: Diagnosis not present

## 2023-11-22 DIAGNOSIS — B078 Other viral warts: Secondary | ICD-10-CM | POA: Diagnosis not present

## 2023-11-22 DIAGNOSIS — R238 Other skin changes: Secondary | ICD-10-CM | POA: Diagnosis not present

## 2023-11-23 DIAGNOSIS — F1199 Opioid use, unspecified with unspecified opioid-induced disorder: Secondary | ICD-10-CM | POA: Diagnosis not present

## 2023-11-28 DIAGNOSIS — F1199 Opioid use, unspecified with unspecified opioid-induced disorder: Secondary | ICD-10-CM | POA: Diagnosis not present

## 2023-11-29 ENCOUNTER — Ambulatory Visit: Payer: Medicare HMO | Admitting: Internal Medicine

## 2023-11-30 DIAGNOSIS — F1199 Opioid use, unspecified with unspecified opioid-induced disorder: Secondary | ICD-10-CM | POA: Diagnosis not present

## 2023-12-01 ENCOUNTER — Other Ambulatory Visit: Payer: Self-pay | Admitting: Internal Medicine

## 2023-12-05 DIAGNOSIS — F1199 Opioid use, unspecified with unspecified opioid-induced disorder: Secondary | ICD-10-CM | POA: Diagnosis not present

## 2023-12-07 DIAGNOSIS — F1199 Opioid use, unspecified with unspecified opioid-induced disorder: Secondary | ICD-10-CM | POA: Diagnosis not present

## 2023-12-12 DIAGNOSIS — F1199 Opioid use, unspecified with unspecified opioid-induced disorder: Secondary | ICD-10-CM | POA: Diagnosis not present

## 2023-12-14 DIAGNOSIS — F1199 Opioid use, unspecified with unspecified opioid-induced disorder: Secondary | ICD-10-CM | POA: Diagnosis not present

## 2023-12-16 DIAGNOSIS — Z79891 Long term (current) use of opiate analgesic: Secondary | ICD-10-CM | POA: Diagnosis not present

## 2023-12-16 DIAGNOSIS — M961 Postlaminectomy syndrome, not elsewhere classified: Secondary | ICD-10-CM | POA: Diagnosis not present

## 2023-12-16 DIAGNOSIS — G894 Chronic pain syndrome: Secondary | ICD-10-CM | POA: Diagnosis not present

## 2023-12-16 DIAGNOSIS — M545 Low back pain, unspecified: Secondary | ICD-10-CM | POA: Diagnosis not present

## 2023-12-19 DIAGNOSIS — F1199 Opioid use, unspecified with unspecified opioid-induced disorder: Secondary | ICD-10-CM | POA: Diagnosis not present

## 2023-12-20 ENCOUNTER — Ambulatory Visit: Payer: Self-pay | Admitting: Internal Medicine

## 2023-12-20 DIAGNOSIS — G894 Chronic pain syndrome: Secondary | ICD-10-CM | POA: Diagnosis not present

## 2023-12-20 DIAGNOSIS — M961 Postlaminectomy syndrome, not elsewhere classified: Secondary | ICD-10-CM | POA: Diagnosis not present

## 2023-12-20 DIAGNOSIS — M545 Low back pain, unspecified: Secondary | ICD-10-CM | POA: Diagnosis not present

## 2023-12-20 NOTE — Telephone Encounter (Signed)
 Called patient. He has a red spot on his stomach that is about the size of a 50 cent piece. He says that it has been there about 4 days and he thinks it is from administering his insulin  too high. Declined urgent care eval today but agreed to come in for acute appointment tomorrow. Confirmed no fever, chills, nausea, vomiting,etc. Pt scheduled with Dr Onesimo tomorrow.

## 2023-12-20 NOTE — Telephone Encounter (Signed)
   Chief Complaint: open sore Symptoms: open sore on stomach, redness, tender to touch Frequency: x 4 days Pertinent Negatives: Patient denies fever, drainage Disposition: [] ED /[] Urgent Care (no appt availability in office) / [x] Appointment(In office/virtual)/ []  [place] [facility] [facility]/ [] Home Care/ [x] Refused Recommended Disposition /[] [place] [facility]/ []  Follow-up with PCP Additional Notes: Patient reports he has had an open sore on his stomach x 4 days. Patient reports it is in the area where he typically injects his insulin . Patient states that the wound is about the size of a dime but that over the last 4 days it has been growing bigger and getting noticably red. Patient is concerned the area is getting infected. Per protocol, this RN attempted to schedule in office visit. No availability within protocol. RN offered UC appt, pt refused. Patient states is unable to be seen anywhere today and would just like an antibiotic called in. This RN advised she would send request to appropriate person for follow-up. Patient advised to call back with worsening symptoms. Patient verbalized understanding.     Copied from CRM [id number]. Topic: Clinical - Red Word Triage >> Dec 20, 2023 12:26 PM Tiffany H wrote: Red Word that prompted transfer to Nurse Triage: INFECTION.   Patient called to advise that wound where patient typically gives himself shots has become infected and has been infected for 4 days. Patient went to Pain Management today to have trial stimulator implanted - Pain Management surgeon told patient that stomach wound is infected and he will need antibiotics from PCP. Please assist. Reason for Disposition  [1] Looks infected (spreading redness, pus) AND [2] diabetes mellitus or weak immune system (e.g., HIV positive, cancer chemo, splenectomy, organ transplant, chronic steroids)  Answer Assessment - Initial Assessment Questions 1. APPEARANCE of SORES: What do the sores look like?      Open wound 2. NUMBER: How many sores are there?     1 3. SIZE: How big is the largest sore?     dime 4. LOCATION: Where are the sores located?     stomach 5. ONSET: When did the sores begin?     4 days ago 6. TENDER: Does it hurt when you touch it?  (Scale 1-10; or mild, moderate, severe)      mild 7. CAUSE: What do you think is causing the sores?     It's where I inject with my insulin  needles 8. OTHER SYMPTOMS: Do you have any other symptoms? (e.g., fever, new weakness)     Tender to touch  Protocols used: Sores-A-AH

## 2023-12-21 ENCOUNTER — Ambulatory Visit: Payer: Medicare HMO | Admitting: Internal Medicine

## 2023-12-21 ENCOUNTER — Encounter: Payer: Self-pay | Admitting: Internal Medicine

## 2023-12-21 VITALS — BP 126/82 | HR 76 | Temp 98.3°F | Ht 68.0 in | Wt 222.4 lb

## 2023-12-21 DIAGNOSIS — E1149 Type 2 diabetes mellitus with other diabetic neurological complication: Secondary | ICD-10-CM | POA: Diagnosis not present

## 2023-12-21 DIAGNOSIS — L089 Local infection of the skin and subcutaneous tissue, unspecified: Secondary | ICD-10-CM | POA: Insufficient documentation

## 2023-12-21 DIAGNOSIS — K769 Liver disease, unspecified: Secondary | ICD-10-CM | POA: Diagnosis not present

## 2023-12-21 DIAGNOSIS — Z7984 Long term (current) use of oral hypoglycemic drugs: Secondary | ICD-10-CM

## 2023-12-21 DIAGNOSIS — F1199 Opioid use, unspecified with unspecified opioid-induced disorder: Secondary | ICD-10-CM | POA: Diagnosis not present

## 2023-12-21 NOTE — Assessment & Plan Note (Signed)
-  Explained to the patient and elevated blood sugars could predispose him to skin infections -He states that he has not been monitoring his blood sugars but has not obtained his continuous glucose monitor and will start to monitor his blood sugars regularly. -He states that he takes his metformin  every other day and not daily.  I instructed him to take this medication daily. -I also encouraged him to be compliant with his insulin  which she was taking daily. -No further workup at this time.

## 2023-12-21 NOTE — Progress Notes (Signed)
 Acute Office Visit  Subjective:     [name], male    DOB: [birth year], 53 y.o.   MRN: [id number]  Chief Complaint  Patient presents with   Acute Visit    Sore on stomach x 5 days    HPI Patient is in today for an infection at an insulin  injection site on his abdomen.  Patient states that approximately 4 days ago he developed pain and an insulin  injection site and then noted progressive erythema and tenderness over the area.  He denied any purulent discharge.  He had a spinal cord stimulator placed yesterday and his pain management doctor placed him on doxycycline  for the wound on his back.  No fevers or chills.  He states that he saw a hair growing in the area and picked at it as well.  Review of Systems  Constitutional: Negative.  Negative for chills and fever.  Respiratory: Negative.    Cardiovascular: Negative.   Skin:  Negative for itching.       Lesion with erythema and redness over his left lower quadrant of his abdomen        Objective:    BP 126/82   Pulse 76   Temp 98.3 F (36.8 C)   Ht 5' 8 (1.727 m)   Wt 222 lb 6.4 oz (100.9 kg)   SpO2 98%   BMI 33.82 kg/m    Physical Exam Constitutional:      Appearance: Normal appearance.  HENT:     Head: Normocephalic and atraumatic.  Cardiovascular:     Rate and Rhythm: Normal rate and regular rhythm.     Heart sounds: Normal heart sounds.  Pulmonary:     Effort: No respiratory distress.     Breath sounds: Normal breath sounds. No wheezing.  Abdominal:     General: Bowel sounds are normal. There is no distension.     Palpations: Abdomen is soft.     Comments: Lesion with erythema and mild tenderness to palpation noted over left lower quadrant.  No fluctuance noted  Skin:    Findings: Erythema and lesion present.       Neurological:     Mental Status: He is alert.     No results found for any visits on 12/21/23.      Assessment & Plan:   Problem List Items Addressed This Visit        Digestive   Liver lesion   -Patient has not yet followed up with Dr. Therisa for his repeat MRI of his liver -I instructed him to call his office for follow-up appointment as well as for the order for his MRI -Patient states that he will follow-up with him for further evaluation -No further workup at this time        Endocrine   Type 2 diabetes mellitus with neurological complications (HCC)   -Explained to the patient and elevated blood sugars could predispose him to skin infections -He states that he has not been monitoring his blood sugars but has not obtained his continuous glucose monitor and will start to monitor his blood sugars regularly. -He states that he takes his metformin  every other day and not daily.  I instructed him to take this medication daily. -I also encouraged him to be compliant with his insulin  which she was taking daily. -No further workup at this time.        Musculoskeletal and Integument   Superficial skin infection - Primary   -Patient presented  today for a skin lesion over his left lower quadrant of his abdomen at an injection site for his insulin  -He states that this started proximately 4 days ago and pain has been persistent.  He denies any purulent discharge. -On exam, he was noted to have a papule with surrounding erythema and mild tenderness to palpation.  No fluctuance noted. -He was already prescribed doxycycline  by his pain doctor after placement of his spinal cord stimulator yesterday.  This should cover his skin infection.  No further antibiotics required at this time. -Of note, patient did forget to take his morning dose of doxycycline  today.  He was instructed to be diligent about taking his antibiotics otherwise his infection could worsen. -He will call us  if his symptoms worsen.       No orders of the defined types were placed in this encounter.   No follow-ups on file.  Brittay Mogle, MD

## 2023-12-21 NOTE — Patient Instructions (Signed)
-  It was a pleasure meeting you today -It does look like you have a skin infection over your insulin  injection site on your abdomen.   -You are already on doxycycline  which he started yesterday after your spinal cord stimulator placement.  This should cover the skin infection on your abdomen -If you notice worsening pain, redness, purulent discharge please contact our office for further evaluation -You are also due for a repeat MRI of your liver.  Your gastroenterologist Dr. Therisa Bi ordered this for you.  Please call his office and make a follow-up appointment with him. -Uncontrolled blood sugars can predispose you to infections.  Please take your metformin  daily and monitor your blood sugars with your continuous glucose monitor and follow-up with Dr. Glendia for your diabetes management.

## 2023-12-21 NOTE — Assessment & Plan Note (Signed)
-  Patient presented today for a skin lesion over his left lower quadrant of his abdomen at an injection site for his insulin  -He states that this started proximately 4 days ago and pain has been persistent.  He denies any purulent discharge. -On exam, he was noted to have a papule with surrounding erythema and mild tenderness to palpation.  No fluctuance noted. -He was already prescribed doxycycline  by his pain doctor after placement of his spinal cord stimulator yesterday.  This should cover his skin infection.  No further antibiotics required at this time. -Of note, patient did forget to take his morning dose of doxycycline  today.  He was instructed to be diligent about taking his antibiotics otherwise his infection could worsen. -He will call us  if his symptoms worsen.

## 2023-12-21 NOTE — Assessment & Plan Note (Signed)
-  Patient has not yet followed up with Dr. Therisa for his repeat MRI of his liver -I instructed him to call his office for follow-up appointment as well as for the order for his MRI -Patient states that he will follow-up with him for further evaluation -No further workup at this time

## 2023-12-26 DIAGNOSIS — F1199 Opioid use, unspecified with unspecified opioid-induced disorder: Secondary | ICD-10-CM | POA: Diagnosis not present

## 2023-12-27 ENCOUNTER — Ambulatory Visit (INDEPENDENT_AMBULATORY_CARE_PROVIDER_SITE_OTHER): Payer: Medicare HMO | Admitting: Family Medicine

## 2023-12-27 ENCOUNTER — Encounter: Payer: Self-pay | Admitting: Family Medicine

## 2023-12-27 VITALS — BP 138/74 | HR 89 | Wt 224.0 lb

## 2023-12-27 DIAGNOSIS — L03311 Cellulitis of abdominal wall: Secondary | ICD-10-CM | POA: Insufficient documentation

## 2023-12-27 DIAGNOSIS — L089 Local infection of the skin and subcutaneous tissue, unspecified: Secondary | ICD-10-CM

## 2023-12-27 MED ORDER — CEFADROXIL 500 MG PO CAPS: 500.0000 mg | ORAL_CAPSULE | Freq: Two times a day (BID) | ORAL | 0 refills | Status: DC

## 2023-12-27 NOTE — Progress Notes (Signed)
Marikay Alar, MD Phone: [phone]  Cameron Sanchez is a 53 y.o. male who presents today for same-day visit.  Skin infection: Patient notes onset last week.  He was seen by another physician in our office.  He had been started on doxycycline after having a trial spinal cord stimulator placed.  The doxycycline was continued.  Patient notes area has not improved with the doxycycline.  He has had some chills though no fevers.  Notes it has been draining some liquidy material.  Social History   Tobacco Use  Smoking Status Never  Smokeless Tobacco Never    Current Outpatient Medications on File Prior to Visit  Medication Sig Dispense Refill   allopurinol (ZYLOPRIM) 300 MG tablet TAKE 1 TABLET BY MOUTH EVERY DAY 90 tablet 1   atorvastatin (LIPITOR) 40 MG tablet Take 1 tablet (40 mg total) by mouth daily. 90 tablet 3   bisacodyl (DULCOLAX) 5 MG EC tablet Take 5 mg by mouth daily as needed for moderate constipation.     Continuous Glucose Sensor (FREESTYLE LIBRE 3 PLUS SENSOR) MISC Use to check blood glucose continuously. Change sensor every 15 days. 2 each 5   Continuous Glucose Sensor (FREESTYLE LIBRE 3 SENSOR) MISC Place 1 sensor on the skin every 14 days. Use to check glucose continuously 2 each 5   doxycycline (VIBRA-TABS) 100 MG tablet Take 100 mg by mouth 2 (two) times daily.     insulin degludec (TRESIBA FLEXTOUCH) 200 UNIT/ML FlexTouch Pen Inject 24 Units into the skin daily. 15 mL 2   Insulin Pen Needle (PEN NEEDLES) 32G X 4 MM MISC Use to inject medication daily 100 each 2   lactulose (CHRONULAC) 10 GM/15ML solution Take 30 mLs (20 g total) by mouth in the morning, at noon, and at bedtime. 946 mL 12   metaxalone (SKELAXIN) 800 MG tablet Take 800 mg by mouth 3 (three) times daily.     metFORMIN (GLUCOPHAGE-XR) 500 MG 24 hr tablet Take 1 tablet (500 mg total) by mouth daily with breakfast. 90 tablet 2   metoprolol succinate (TOPROL-XL) 50 MG 24 hr tablet TAKE 2 TABLETS BY MOUTH  DAILY WITH OR IMMEDIATELY FOLLOWING A MEAL. 180 tablet 1   oxyCODONE (ROXICODONE) 15 MG immediate release tablet Take 15 mg by mouth every 4 (four) hours.     pantoprazole (PROTONIX) 40 MG tablet TAKE 1 TABLET (40 MG TOTAL) BY MOUTH TWICE A DAY BEFORE MEALS 180 tablet 3   sildenafil (REVATIO) 20 MG tablet Take 1-5 tablets 1 hour prior to intercourse as needed 30 tablet 11   triamterene-hydrochlorothiazide (MAXZIDE-25) 37.5-25 MG tablet TAKE 1 TABLET BY MOUTH EVERY DAY 90 tablet 1   No current facility-administered medications on file prior to visit.     ROS see history of present illness  Objective  Physical Exam Vitals:   12/27/23 0904  BP: 138/74  Pulse: 89  SpO2: 98%    BP Readings from Last 3 Encounters:  12/27/23 138/74  12/21/23 126/82  10/18/23 138/72   Wt Readings from Last 3 Encounters:  12/27/23 224 lb (101.6 kg)  12/21/23 222 lb 6.4 oz (100.9 kg)  10/18/23 221 lb (100.2 kg)    Physical Exam  Left lower abdomen, there is warmth and induration, no fluctuance noted, no drainage noted   Assessment/Plan: Please see individual problem list.  Cellulitis of abdominal wall Assessment & Plan: Acute illness with systemic symptoms of chills.  Symptoms are consistent with cellulitis.  Has not improved with doxycycline.  We  will add cefadroxil 500 mg twice daily.  He will let us know if this is too expensive.  He will continue his doxycycline as well.  He will follow-up in 2 days.  If it worsens or if he physically starts to feel worse with fevers or new symptoms he will go to the emergency room.  Orders: -     Cefadroxil; Take 1 capsule (500 mg total) by mouth 2 (two) times daily.  Dispense: 14 capsule; Refill: 0    Return in 2 days (on 12/29/2023) for Needs follow-up for cellulitis, can schedule with any provider.   Marikay Alar, MD [facility]

## 2023-12-27 NOTE — Patient Instructions (Signed)
Nice to see you. We will start you on cefadroxil 500 mg twice daily for 7 days.  Please complete your course of doxycycline. If you develop excessive diarrhea please let us know.  If you progressively feel worse and develop fevers or rapidly spreading redness please go to the emergency room.

## 2023-12-27 NOTE — Assessment & Plan Note (Addendum)
Acute illness with systemic symptoms of chills.  Symptoms are consistent with cellulitis.  Has not improved with doxycycline.  We will add cefadroxil 500 mg twice daily.  He will let us know if this is too expensive.  He will continue his doxycycline as well.  He will follow-up in 2 days.  If it worsens or if he physically starts to feel worse with fevers or new symptoms he will go to the emergency room.

## 2023-12-28 DIAGNOSIS — F1199 Opioid use, unspecified with unspecified opioid-induced disorder: Secondary | ICD-10-CM | POA: Diagnosis not present

## 2023-12-29 ENCOUNTER — Ambulatory Visit (INDEPENDENT_AMBULATORY_CARE_PROVIDER_SITE_OTHER): Payer: Medicare HMO | Admitting: Internal Medicine

## 2023-12-29 ENCOUNTER — Encounter: Payer: Self-pay | Admitting: Internal Medicine

## 2023-12-29 VITALS — BP 114/86 | HR 76 | Temp 98.3°F | Ht 68.0 in | Wt 224.0 lb

## 2023-12-29 DIAGNOSIS — K769 Liver disease, unspecified: Secondary | ICD-10-CM

## 2023-12-29 DIAGNOSIS — I10 Essential (primary) hypertension: Secondary | ICD-10-CM | POA: Diagnosis not present

## 2023-12-29 DIAGNOSIS — Z7984 Long term (current) use of oral hypoglycemic drugs: Secondary | ICD-10-CM | POA: Diagnosis not present

## 2023-12-29 DIAGNOSIS — L03311 Cellulitis of abdominal wall: Secondary | ICD-10-CM | POA: Diagnosis not present

## 2023-12-29 DIAGNOSIS — E1149 Type 2 diabetes mellitus with other diabetic neurological complication: Secondary | ICD-10-CM

## 2023-12-29 DIAGNOSIS — K7460 Unspecified cirrhosis of liver: Secondary | ICD-10-CM | POA: Diagnosis not present

## 2023-12-29 NOTE — Progress Notes (Signed)
 Subjective:    [name], male    DOB: 1971/05/31, 53 y.o.   MRN: [id number]  Patient here for  Chief Complaint  Patient presents with   Wound Check    HPI Work in appt - work in for cellulitis of abdominal wall. Was evaluated 12/21/23 and was instructed to continue doxycycline (that he had just started after placement of spinal cord stimulator). Had f/u 12/26/22 - and was taking doxycycline. Cefadroxil was added. States has been present for a couple of months. Worsened recent as outlined. He had been applying neosporin prior to the above evaluation. Has improved. No fever. Eating. Not checking sugars. Discussed the need to monitor sugars and monitoring his diet.    Past Medical History:  Diagnosis Date   Allergy    Anemia    Aortic atherosclerosis (HCC)    Chronic back pain    Chronic back pain    Cirrhosis of liver (HCC)    Complication of anesthesia    hard time to wake up x1   COVID-19    DM (diabetes mellitus), type 2 (HCC)    GERD (gastroesophageal reflux disease)    Gout    Hypercholesterolemia    Hypertension    Liver lesion    Migraines    Obesity    Sleep apnea    Subarachnoid hemorrhage (HCC) 11/2020   no surgery   Thrombocytopenia (HCC)    TIA (transient ischemic attack)    Past Surgical History:  Procedure Laterality Date   BACK SURGERY  08/12/2014   multiple   BOTOX INJECTION N/A 11/04/2022   Procedure: BOTOX INJECTION;  Surgeon: Leafy Ro, MD;  Location: [facility] ORS;  Service: General;  Laterality: N/A;   CHOLECYSTECTOMY     COLONOSCOPY WITH PROPOFOL N/A 03/11/2022   Procedure: COLONOSCOPY WITH PROPOFOL;  Surgeon: Wyline Mood, MD;  Location: [facility] ENDOSCOPY;  Service: Gastroenterology;  Laterality: N/A;   COLONOSCOPY WITH PROPOFOL N/A 11/17/2022   Procedure: COLONOSCOPY WITH PROPOFOL;  Surgeon: Wyline Mood, MD;  Location: [facility] ENDOSCOPY;  Service: Gastroenterology;  Laterality: N/A;   COLONOSCOPY WITH PROPOFOL N/A 11/24/2022   Procedure:  COLONOSCOPY WITH PROPOFOL;  Surgeon: Wyline Mood, MD;  Location: [facility] ENDOSCOPY;  Service: Gastroenterology;  Laterality: N/A;   ESOPHAGOGASTRODUODENOSCOPY N/A 03/11/2022   Procedure: ESOPHAGOGASTRODUODENOSCOPY (EGD);  Surgeon: Wyline Mood, MD;  Location: [facility] ENDOSCOPY;  Service: Gastroenterology;  Laterality: N/A;   KNEE ARTHROSCOPY Left    Dr Monica Martinez   SPHINCTEROTOMY N/A 11/04/2022   Procedure: SPHINCTEROTOMY;  Surgeon: Leafy Ro, MD;  Location: [facility] ORS;  Service: General;  Laterality: N/A;   Family History  Problem Relation Age of Onset   Cancer Father        germ cell (retroperitoneal)   Coronary artery disease Other        s/p stent (age 8)   Hypertension Paternal Grandfather    Kidney cancer Paternal Grandfather    Diabetes Paternal Grandfather    Diabetes Maternal Grandfather    Heart disease Cousin        MI - age 29   Social History   Socioeconomic History   Marital status: Not on file    Spouse name: Not on file   Number of children: 2   Years of education: 12   Highest education level: Not on file  Occupational History   Occupation: disabled    Employer: [employer]  Tobacco Use   Smoking status: Never   Smokeless tobacco: Never  Vaping Use   Vaping status: Never Used  Substance and Sexual Activity   Alcohol use: No    Alcohol/week: 0.0 standard drinks of alcohol   Drug use: Never   Sexual activity: Not on file  Other Topics Concern   Not on file  Social History Narrative   Lives with girlfriend in a one story home.  Has 2 sons.  Trying to get disability due to several back issues.  Education: high school.    Social Drivers of Corporate investment banker Strain: Low Risk  (02/28/2023)   Overall Financial Resource Strain (CARDIA)    Difficulty of Paying Living Expenses: Not hard at all  Food Insecurity: No Food Insecurity (02/28/2023)   Hunger Vital Sign    Worried About Running Out of Food in the Last Year: Never true    Ran Out of Food in  the Last Year: Never true  Transportation Needs: No Transportation Needs (02/28/2023)   PRAPARE - Administrator, Civil Service (Medical): No    Lack of Transportation (Non-Medical): No  Physical Activity: Not on file  Stress: No Stress Concern Present (02/28/2023)   [employer] of Occupational Health - Occupational Stress Questionnaire    Feeling of Stress : Not at all  Social Connections: Unknown (02/28/2023)   Social Connection and Isolation Panel [NHANES]    Frequency of Communication with Friends and Family: Not on file    Frequency of Social Gatherings with Friends and Family: Not on file    Attends Religious Services: Not on file    Active Member of Clubs or Organizations: Not on file    Attends Banker Meetings: Not on file    Marital Status: Married     Review of Systems  Constitutional:  Negative for appetite change, fever and unexpected weight change.  HENT:  Negative for congestion and sinus pressure.   Respiratory:  Negative for cough, chest tightness and shortness of breath.   Cardiovascular:  Negative for chest pain and palpitations.       No increased swelling.   Gastrointestinal:  Negative for abdominal pain and vomiting.  Genitourinary:  Negative for difficulty urinating and dysuria.  Musculoskeletal:  Positive for back pain. Negative for joint swelling.  Skin:        Skin lesion - abdomen - minimal surrounding erythema.   Neurological:  Negative for dizziness and headaches.  Psychiatric/Behavioral:  Negative for agitation and dysphoric mood.        Objective:     BP 114/86   Pulse 76   Temp 98.3 F (36.8 C) (Oral)   Wt 224 lb (101.6 kg)   SpO2 97%   BMI 34.06 kg/m  Wt Readings from Last 3 Encounters:  12/29/23 224 lb (101.6 kg)  12/27/23 224 lb (101.6 kg)  12/21/23 222 lb 6.4 oz (100.9 kg)    Physical Exam Vitals reviewed.  Constitutional:      General: He is not in acute distress.    Appearance: Normal appearance.  He is well-developed.  HENT:     Head: Normocephalic and atraumatic.     Right Ear: External ear normal.     Left Ear: External ear normal.  Eyes:     General: No scleral icterus.       Right eye: No discharge.        Left eye: No discharge.     Conjunctiva/sclera: Conjunctivae normal.  Cardiovascular:     Rate and Rhythm: Normal rate and regular  rhythm.  Pulmonary:     Effort: Pulmonary effort is normal. No respiratory distress.     Breath sounds: Normal breath sounds.  Abdominal:     General: Bowel sounds are normal.     Palpations: Abdomen is soft.     Tenderness: There is no abdominal tenderness.  Musculoskeletal:        General: No swelling or tenderness.     Cervical back: Neck supple. No tenderness.  Lymphadenopathy:     Cervical: No cervical adenopathy.  Skin:    Comments: Left lower abdomen - persistent (improved), scabbed. Minimal surrounding erythema. No increased drainage.   Neurological:     Mental Status: He is alert.  Psychiatric:        Mood and Affect: Mood normal.        Behavior: Behavior normal.         Outpatient Encounter Medications as of 12/29/2023  Medication Sig   allopurinol (ZYLOPRIM) 300 MG tablet TAKE 1 TABLET BY MOUTH EVERY DAY   atorvastatin (LIPITOR) 40 MG tablet Take 1 tablet (40 mg total) by mouth daily.   bisacodyl (DULCOLAX) 5 MG EC tablet Take 5 mg by mouth daily as needed for moderate constipation.   cefadroxil (DURICEF) 500 MG capsule Take 1 capsule (500 mg total) by mouth 2 (two) times daily.   Continuous Glucose Sensor (FREESTYLE LIBRE 3 PLUS SENSOR) MISC Use to check blood glucose continuously. Change sensor every 15 days.   Continuous Glucose Sensor (FREESTYLE LIBRE 3 SENSOR) MISC Place 1 sensor on the skin every 14 days. Use to check glucose continuously   insulin degludec (TRESIBA FLEXTOUCH) 200 UNIT/ML FlexTouch Pen Inject 24 Units into the skin daily.   Insulin Pen Needle (PEN NEEDLES) 32G X 4 MM MISC Use to inject  medication daily   lactulose (CHRONULAC) 10 GM/15ML solution Take 30 mLs (20 g total) by mouth in the morning, at noon, and at bedtime.   metaxalone (SKELAXIN) 800 MG tablet Take 800 mg by mouth 3 (three) times daily.   metFORMIN (GLUCOPHAGE-XR) 500 MG 24 hr tablet Take 1 tablet (500 mg total) by mouth daily with breakfast.   metoprolol succinate (TOPROL-XL) 50 MG 24 hr tablet TAKE 2 TABLETS BY MOUTH DAILY WITH OR IMMEDIATELY FOLLOWING A MEAL.   oxyCODONE (ROXICODONE) 15 MG immediate release tablet Take 15 mg by mouth every 4 (four) hours.   pantoprazole (PROTONIX) 40 MG tablet TAKE 1 TABLET (40 MG TOTAL) BY MOUTH TWICE A DAY BEFORE MEALS   sildenafil (REVATIO) 20 MG tablet Take 1-5 tablets 1 hour prior to intercourse as needed   triamterene-hydrochlorothiazide (MAXZIDE-25) 37.5-25 MG tablet TAKE 1 TABLET BY MOUTH EVERY DAY   [DISCONTINUED] doxycycline (VIBRA-TABS) 100 MG tablet Take 100 mg by mouth 2 (two) times daily. (Patient not taking: Reported on 12/29/2023)   No facility-administered encounter medications on file as of 12/29/2023.     Lab Results  Component Value Date   WBC 9.7 10/18/2023   HGB 14.0 10/18/2023   HCT 43.1 10/18/2023   PLT 154.0 10/18/2023   GLUCOSE 220 (H) 10/18/2023   CHOL 167 10/18/2023   TRIG 127.0 10/18/2023   HDL 52.40 10/18/2023   LDLDIRECT 53.0 09/08/2022   LDLCALC 89 10/18/2023   ALT 25 10/18/2023   AST 24 10/18/2023   NA 132 (L) 10/18/2023   K 4.2 10/18/2023   CL 100 10/18/2023   CREATININE 1.00 10/18/2023   BUN 13 10/18/2023   CO2 27 10/18/2023   TSH 2.87 06/17/2023  PSA 0.41 06/17/2023   INR 1.1 02/03/2023   HGBA1C 10.0 (H) 10/18/2023   MICROALBUR <0.7 06/17/2023    DG Abd 1 View Result Date: 10/18/2023 CLINICAL DATA:  Abdominal pain, constipation x1 week EXAM: ABDOMEN - 1 VIEW COMPARISON:  05/25/2021 FINDINGS: Stomach decompressed. A few gas distended nondilated left mid abdominal small bowel loops. Moderate proximal colonic fecal material,  decompressed distally. No dilatation. Stable lumbosacral and right SI fixation hardware. Cholecystectomy clips. Bilateral pelvic phleboliths. IMPRESSION: Nonobstructive bowel gas pattern. Electronically Signed   By: Corlis Leak M.D.   On: 10/18/2023 12:45       Assessment & Plan:  Cellulitis of abdominal wall Assessment & Plan: Overall the area has improved. Complete duricef. Probiotics as directed.  Hold on applying neosporin. Leave area open as much as possible. Follow.  Call with update.    Type 2 diabetes mellitus with neurological complications [facility]) Assessment & Plan: Have dscussed diet and exercise.  On tresiba - 24 units.  Check met b and A1c.  Did not tolerate .5mg  ozempic - increased constipation issues. Has started back on metformin - not taking regularly. Not watching his diet. Not checking sugars.  Discussed diet and exercise. Discussed importance of checking sugars and getting sugars under control.    Liver lesion Assessment & Plan: Saw GI (Dr Tobi Bastos) - 01/2023 - recommended MRI of the liver in 07/05/2023 to screen for [facility].  Still has not followed up with GI.  Discussed again today the need to follow up.  He will call to schedule.    Primary hypertension Assessment & Plan: Blood pressure doing well.  Continue triam/hctz and metoprolol.  Follow pressures.  Follow metabolic panel.    Cirrhosis of liver without ascites, unspecified hepatic cirrhosis type [facility]) Assessment & Plan: Saw GI (Dr Tobi Bastos) - 01/2023 - recommended MRI of the liver in 07/05/2023 to screen for [facility]. Requires hepatitis B vaccination - states receive first.  Continue f/u with GI. Discussed again today the need to follow up.       D[place], MD

## 2024-01-02 ENCOUNTER — Encounter: Payer: Self-pay | Admitting: Internal Medicine

## 2024-01-02 DIAGNOSIS — F1199 Opioid use, unspecified with unspecified opioid-induced disorder: Secondary | ICD-10-CM | POA: Diagnosis not present

## 2024-01-02 LAB — HM DIABETES FOOT EXAM

## 2024-01-02 NOTE — Assessment & Plan Note (Signed)
 Have dscussed diet and exercise.  On tresiba - 24 units.  Check met b and A1c.  Did not tolerate .5mg  ozempic - increased constipation issues. Has started back on metformin - not taking regularly. Not watching his diet. Not checking sugars.  Discussed diet and exercise. Discussed importance of checking sugars and getting sugars under control.

## 2024-01-02 NOTE — Assessment & Plan Note (Signed)
 Saw GI (Dr Tobi Bastos) - 01/2023 - recommended MRI of the liver in 07/05/2023 to screen for [facility].  Still has not followed up with GI.  Discussed again today the need to follow up.  He will call to schedule.

## 2024-01-02 NOTE — Assessment & Plan Note (Signed)
 Overall the area has improved. Complete duricef. Probiotics as directed.  Hold on applying neosporin. Leave area open as much as possible. Follow.  Call with update.

## 2024-01-02 NOTE — Assessment & Plan Note (Signed)
 Saw GI (Dr Tobi Bastos) - 01/2023 - recommended MRI of the liver in 07/05/2023 to screen for [facility]. Requires hepatitis B vaccination - states receive first.  Continue f/u with GI. Discussed again today the need to follow up.

## 2024-01-02 NOTE — Assessment & Plan Note (Signed)
Blood pressure doing well.  Continue triam/hctz and metoprolol.  Follow pressures.  Follow metabolic panel.

## 2024-01-04 DIAGNOSIS — F1199 Opioid use, unspecified with unspecified opioid-induced disorder: Secondary | ICD-10-CM | POA: Diagnosis not present

## 2024-01-09 DIAGNOSIS — F1199 Opioid use, unspecified with unspecified opioid-induced disorder: Secondary | ICD-10-CM | POA: Diagnosis not present

## 2024-01-11 DIAGNOSIS — F1199 Opioid use, unspecified with unspecified opioid-induced disorder: Secondary | ICD-10-CM | POA: Diagnosis not present

## 2024-01-13 DIAGNOSIS — Z79891 Long term (current) use of opiate analgesic: Secondary | ICD-10-CM | POA: Diagnosis not present

## 2024-01-13 DIAGNOSIS — M545 Low back pain, unspecified: Secondary | ICD-10-CM | POA: Diagnosis not present

## 2024-01-13 DIAGNOSIS — M961 Postlaminectomy syndrome, not elsewhere classified: Secondary | ICD-10-CM | POA: Diagnosis not present

## 2024-01-13 DIAGNOSIS — G894 Chronic pain syndrome: Secondary | ICD-10-CM | POA: Diagnosis not present

## 2024-01-16 ENCOUNTER — Ambulatory Visit: Payer: Medicare HMO | Admitting: Internal Medicine

## 2024-01-16 DIAGNOSIS — F1199 Opioid use, unspecified with unspecified opioid-induced disorder: Secondary | ICD-10-CM | POA: Diagnosis not present

## 2024-01-18 DIAGNOSIS — F1199 Opioid use, unspecified with unspecified opioid-induced disorder: Secondary | ICD-10-CM | POA: Diagnosis not present

## 2024-01-19 ENCOUNTER — Ambulatory Visit (INDEPENDENT_AMBULATORY_CARE_PROVIDER_SITE_OTHER): Admitting: Nurse Practitioner

## 2024-01-19 ENCOUNTER — Encounter: Payer: Self-pay | Admitting: Nurse Practitioner

## 2024-01-19 VITALS — BP 122/78 | HR 80 | Temp 98.6°F | Ht 68.0 in | Wt 225.8 lb

## 2024-01-19 DIAGNOSIS — L089 Local infection of the skin and subcutaneous tissue, unspecified: Secondary | ICD-10-CM

## 2024-01-19 MED ORDER — MUPIROCIN 2 % EX OINT: 1.0000 | TOPICAL_OINTMENT | Freq: Two times a day (BID) | CUTANEOUS | 0 refills | Status: DC

## 2024-01-19 NOTE — Progress Notes (Signed)
 Established Patient Office Visit  Subjective:  [name], male    DOB: 10-Jun-1971  Age: 53 y.o. MRN: [id number]  CC:  Chief Complaint  Patient presents with   Acute Visit    Skin infection    HPI  Cameron Sanchez presents for acute visit for follow-up on cellulitis of the abdominal wall believes may have been caused by an insulin injection. He has treated with doxycycline and cefadroxil.   He has completed the course of antibiotic about a week ago.  He reports wound was previously  warm and tender but has improved since completing the antibiotic.  No discharge only mild tenderness.   HPI   Past Medical History:  Diagnosis Date   Allergy    Anemia    Aortic atherosclerosis (HCC)    Chronic back pain    Chronic back pain    Cirrhosis of liver (HCC)    Complication of anesthesia    hard time to wake up x1   COVID-19    DM (diabetes mellitus), type 2 (HCC)    GERD (gastroesophageal reflux disease)    Gout    Hypercholesterolemia    Hypertension    Liver lesion    Migraines    Obesity    Sleep apnea    Subarachnoid hemorrhage (HCC) 11/2020   no surgery   Thrombocytopenia (HCC)    TIA (transient ischemic attack)     Past Surgical History:  Procedure Laterality Date   BACK SURGERY  08/12/2014   multiple   BOTOX INJECTION N/A 11/04/2022   Procedure: BOTOX INJECTION;  Surgeon: Leafy Ro, MD;  Location: [facility] ORS;  Service: General;  Laterality: N/A;   CHOLECYSTECTOMY     COLONOSCOPY WITH PROPOFOL N/A 03/11/2022   Procedure: COLONOSCOPY WITH PROPOFOL;  Surgeon: Wyline Mood, MD;  Location: [facility] ENDOSCOPY;  Service: Gastroenterology;  Laterality: N/A;   COLONOSCOPY WITH PROPOFOL N/A 11/17/2022   Procedure: COLONOSCOPY WITH PROPOFOL;  Surgeon: Wyline Mood, MD;  Location: [facility] ENDOSCOPY;  Service: Gastroenterology;  Laterality: N/A;   COLONOSCOPY WITH PROPOFOL N/A 11/24/2022   Procedure: COLONOSCOPY WITH PROPOFOL;  Surgeon: Wyline Mood, MD;  Location:  [facility] ENDOSCOPY;  Service: Gastroenterology;  Laterality: N/A;   ESOPHAGOGASTRODUODENOSCOPY N/A 03/11/2022   Procedure: ESOPHAGOGASTRODUODENOSCOPY (EGD);  Surgeon: Wyline Mood, MD;  Location: [facility] ENDOSCOPY;  Service: Gastroenterology;  Laterality: N/A;   KNEE ARTHROSCOPY Left    Dr Monica Martinez   SPHINCTEROTOMY N/A 11/04/2022   Procedure: SPHINCTEROTOMY;  Surgeon: Leafy Ro, MD;  Location: [facility] ORS;  Service: General;  Laterality: N/A;    Family History  Problem Relation Age of Onset   Cancer Father        germ cell (retroperitoneal)   Coronary artery disease Other        s/p stent (age 28)   Hypertension Paternal Grandfather    Kidney cancer Paternal Grandfather    Diabetes Paternal Grandfather    Diabetes Maternal Grandfather    Heart disease Cousin        MI - age 79    Social History   Socioeconomic History   Marital status: Married    Spouse name: Not on file   Number of children: 2   Years of education: 12   Highest education level: Not on file  Occupational History   Occupation: disabled    Employer: [employer]  Tobacco Use   Smoking status: Never   Smokeless tobacco: Never  Vaping Use   Vaping status:  Never Used  Substance and Sexual Activity   Alcohol use: No    Alcohol/week: 0.0 standard drinks of alcohol   Drug use: Never   Sexual activity: Not on file  Other Topics Concern   Not on file  Social History Narrative   Lives with girlfriend in a one story home.  Has 2 sons.  Trying to get disability due to several back issues.  Education: high school.    Social Drivers of Corporate investment banker Strain: Low Risk  (02/28/2023)   Overall Financial Resource Strain (CARDIA)    Difficulty of Paying Living Expenses: Not hard at all  Food Insecurity: No Food Insecurity (02/28/2023)   Hunger Vital Sign    Worried About Running Out of Food in the Last Year: Never true    Ran Out of Food in the Last Year: Never true  Transportation Needs: No  Transportation Needs (02/28/2023)   PRAPARE - Administrator, Civil Service (Medical): No    Lack of Transportation (Non-Medical): No  Physical Activity: Not on file  Stress: No Stress Concern Present (02/28/2023)   [employer] of Occupational Health - Occupational Stress Questionnaire    Feeling of Stress : Not at all  Social Connections: Unknown (02/28/2023)   Social Connection and Isolation Panel [NHANES]    Frequency of Communication with Friends and Family: Not on file    Frequency of Social Gatherings with Friends and Family: Not on file    Attends Religious Services: Not on file    Active Member of Clubs or Organizations: Not on file    Attends Banker Meetings: Not on file    Marital Status: Married  Intimate Partner Violence: Not At Risk (02/28/2023)   Humiliation, Afraid, Rape, and Kick questionnaire    Fear of Current or Ex-Partner: No    Emotionally Abused: No    Physically Abused: No    Sexually Abused: No     Outpatient Medications Prior to Visit  Medication Sig Dispense Refill   allopurinol (ZYLOPRIM) 300 MG tablet TAKE 1 TABLET BY MOUTH EVERY DAY 90 tablet 1   atorvastatin (LIPITOR) 40 MG tablet Take 1 tablet (40 mg total) by mouth daily. 90 tablet 3   bisacodyl (DULCOLAX) 5 MG EC tablet Take 5 mg by mouth daily as needed for moderate constipation.     cefadroxil (DURICEF) 500 MG capsule Take 1 capsule (500 mg total) by mouth 2 (two) times daily. 14 capsule 0   Continuous Glucose Sensor (FREESTYLE LIBRE 3 PLUS SENSOR) MISC Use to check blood glucose continuously. Change sensor every 15 days. 2 each 5   Continuous Glucose Sensor (FREESTYLE LIBRE 3 SENSOR) MISC Place 1 sensor on the skin every 14 days. Use to check glucose continuously 2 each 5   insulin degludec (TRESIBA FLEXTOUCH) 200 UNIT/ML FlexTouch Pen Inject 24 Units into the skin daily. 15 mL 2   Insulin Pen Needle (PEN NEEDLES) 32G X 4 MM MISC Use to inject medication daily 100 each 2    metaxalone (SKELAXIN) 800 MG tablet Take 800 mg by mouth 3 (three) times daily.     metFORMIN (GLUCOPHAGE-XR) 500 MG 24 hr tablet Take 1 tablet (500 mg total) by mouth daily with breakfast. 90 tablet 2   metoprolol succinate (TOPROL-XL) 50 MG 24 hr tablet TAKE 2 TABLETS BY MOUTH DAILY WITH OR IMMEDIATELY FOLLOWING A MEAL. 180 tablet 1   oxyCODONE (ROXICODONE) 15 MG immediate release tablet Take 15 mg by mouth every  4 (four) hours.     pantoprazole (PROTONIX) 40 MG tablet TAKE 1 TABLET (40 MG TOTAL) BY MOUTH TWICE A DAY BEFORE MEALS 180 tablet 3   sildenafil (REVATIO) 20 MG tablet Take 1-5 tablets 1 hour prior to intercourse as needed 30 tablet 11   triamterene-hydrochlorothiazide (MAXZIDE-25) 37.5-25 MG tablet TAKE 1 TABLET BY MOUTH EVERY DAY 90 tablet 1   lactulose (CHRONULAC) 10 GM/15ML solution Take 30 mLs (20 g total) by mouth in the morning, at noon, and at bedtime. 946 mL 12   No facility-administered medications prior to visit.    No Known Allergies  ROS Review of Systems Negative unless indicated in HPI.    Objective:    Physical Exam Constitutional:      Appearance: Normal appearance.  Cardiovascular:     Rate and Rhythm: Normal rate and regular rhythm.  Pulmonary:     Effort: Pulmonary effort is normal.     Breath sounds: Normal breath sounds.  Abdominal:     General: Bowel sounds are normal.     Comments: no fluctuance or drainage noted.  Neurological:     Mental Status: He is alert.     BP 122/78   Pulse 80   Temp 98.6 F (37 C)   Ht 5\' 8"  (1.727 m)   Wt 225 lb 12.8 oz (102.4 kg)   SpO2 97%   BMI 34.33 kg/m  Wt Readings from Last 3 Encounters:  01/19/24 225 lb 12.8 oz (102.4 kg)  12/29/23 224 lb (101.6 kg)  12/27/23 224 lb (101.6 kg)     Health Maintenance  Topic Date Due   Pneumococcal Vaccine 20-6 Years old (1 of 2 - PCV) Never done   Zoster Vaccines- Shingrix (1 of 2) Never done   COVID-19 Vaccine (5 - 2024-25 season) 07/17/2023    Colonoscopy  11/25/2023   Medicare Annual Wellness (AWV)  02/28/2024   INFLUENZA VACCINE  02/13/2024 (Originally 06/16/2023)   HEMOGLOBIN A1C  04/17/2024   OPHTHALMOLOGY EXAM  06/15/2024   Diabetic kidney evaluation - Urine ACR  06/16/2024   Diabetic kidney evaluation - eGFR measurement  10/17/2024   FOOT EXAM  01/01/2025   DTaP/Tdap/Td (2 - Td or Tdap) 11/29/2030   Hepatitis C Screening  Completed   HIV Screening  Completed   HPV VACCINES  Aged Out    There are no preventive care reminders to display for this patient.  Lab Results  Component Value Date   TSH 2.87 06/17/2023   Lab Results  Component Value Date   WBC 9.7 10/18/2023   HGB 14.0 10/18/2023   HCT 43.1 10/18/2023   MCV 80.9 10/18/2023   PLT 154.0 10/18/2023   Lab Results  Component Value Date   NA 132 (L) 10/18/2023   K 4.2 10/18/2023   CO2 27 10/18/2023   GLUCOSE 220 (H) 10/18/2023   BUN 13 10/18/2023   CREATININE 1.00 10/18/2023   BILITOT 0.8 10/18/2023   ALKPHOS 61 10/18/2023   AST 24 10/18/2023   ALT 25 10/18/2023   PROT 7.2 10/18/2023   ALBUMIN 4.1 10/18/2023   CALCIUM 9.2 10/18/2023   ANIONGAP 8 10/15/2022   EGFR 86 02/03/2023   GFR 85.36 10/18/2023   Lab Results  Component Value Date   CHOL 167 10/18/2023   Lab Results  Component Value Date   HDL 52.40 10/18/2023   Lab Results  Component Value Date   LDLCALC 89 10/18/2023   Lab Results  Component Value Date   TRIG 127.0  10/18/2023   Lab Results  Component Value Date   CHOLHDL 3 10/18/2023   Lab Results  Component Value Date   HGBA1C 10.0 (H) 10/18/2023      Assessment & Plan:  Superficial skin infection Assessment & Plan: Skin infection likely from insulin injection site, improved but not fully resolved. - Prescribe Bactroban ointment for topical use. - Instruct to monitor infection and report worsening or lack of improvement.   Other orders -     Mupirocin; Apply 1 Application topically 2 (two) times daily.  Dispense:  22 g; Refill: 0    Follow-up: No follow-ups on file.   Kara Dies, NP

## 2024-01-23 DIAGNOSIS — F1199 Opioid use, unspecified with unspecified opioid-induced disorder: Secondary | ICD-10-CM | POA: Diagnosis not present

## 2024-01-25 DIAGNOSIS — F1199 Opioid use, unspecified with unspecified opioid-induced disorder: Secondary | ICD-10-CM | POA: Diagnosis not present

## 2024-01-27 ENCOUNTER — Telehealth: Payer: Self-pay

## 2024-01-27 NOTE — Telephone Encounter (Signed)
 Pt has been notifiued that his novo fine needles from pt assistance came in 2 boxes he can pick up anytime between 9-5 pm M-F. Placed in cabinet above trish head

## 2024-01-28 NOTE — Assessment & Plan Note (Signed)
 Skin infection likely from insulin injection site, improved but not fully resolved. - Prescribe Bactroban ointment for topical use. - Instruct to monitor infection and report worsening or lack of improvement.

## 2024-02-01 DIAGNOSIS — F1199 Opioid use, unspecified with unspecified opioid-induced disorder: Secondary | ICD-10-CM | POA: Diagnosis not present

## 2024-02-08 DIAGNOSIS — F1199 Opioid use, unspecified with unspecified opioid-induced disorder: Secondary | ICD-10-CM | POA: Diagnosis not present

## 2024-02-09 ENCOUNTER — Telehealth: Payer: Self-pay

## 2024-02-09 NOTE — Telephone Encounter (Signed)
 LVM to inform pt that his pt assistance medication [name] (2 boxes)is here and he can pick up anytime between 9-5 pm M-F

## 2024-02-10 DIAGNOSIS — M961 Postlaminectomy syndrome, not elsewhere classified: Secondary | ICD-10-CM | POA: Diagnosis not present

## 2024-02-10 DIAGNOSIS — G894 Chronic pain syndrome: Secondary | ICD-10-CM | POA: Diagnosis not present

## 2024-02-10 DIAGNOSIS — Z79891 Long term (current) use of opiate analgesic: Secondary | ICD-10-CM | POA: Diagnosis not present

## 2024-02-10 DIAGNOSIS — M545 Low back pain, unspecified: Secondary | ICD-10-CM | POA: Diagnosis not present

## 2024-02-20 ENCOUNTER — Telehealth: Payer: Self-pay

## 2024-02-20 NOTE — Telephone Encounter (Signed)
 Copied from CRM [id number]. Topic: General - Other >> Feb 20, 2024 12:21 PM Truddie Crumble wrote: Reason for CRM: patient called stating he has been seeing several providers or a diabetic wound on his stomach and he was given some antibiotic cream and the patient states he does  not see any difference in the wound, but it looks better than it did. The  patient does not know if he needs to make another appointment or get more antibiotic cream. Patient stated he is still using bandages on the wound

## 2024-02-20 NOTE — Telephone Encounter (Signed)
 Called patient to confirm spot on his stomach is looking better but not healed. Pt scheduled for this week to have it looked at. Advised we need to see it to determine best treatment if any is needed.

## 2024-02-22 DIAGNOSIS — F1199 Opioid use, unspecified with unspecified opioid-induced disorder: Secondary | ICD-10-CM | POA: Diagnosis not present

## 2024-02-23 ENCOUNTER — Ambulatory Visit: Admitting: Internal Medicine

## 2024-02-23 NOTE — Progress Notes (Deleted)
 Subjective:    [name], male    DOB: [birth year], 52 y.o.   MRN: [id number]  Patient here for No chief complaint on file.   HPI Here for a work in appt - work in to f/u regarding stomach wound. Has been persistent. Has ben on oral abx and most recently - bactroban.    Past Medical History:  Diagnosis Date   Allergy    Anemia    Aortic atherosclerosis (HCC)    Chronic back pain    Chronic back pain    Cirrhosis of liver (HCC)    Complication of anesthesia    hard time to wake up x1   COVID-19    DM (diabetes mellitus), type 2 (HCC)    GERD (gastroesophageal reflux disease)    Gout    Hypercholesterolemia    Hypertension    Liver lesion    Migraines    Obesity    Sleep apnea    Subarachnoid hemorrhage (HCC) 11/2020   no surgery   Thrombocytopenia (HCC)    TIA (transient ischemic attack)    Past Surgical History:  Procedure Laterality Date   BACK SURGERY  08/12/2014   multiple   BOTOX INJECTION N/A 11/04/2022   Procedure: BOTOX INJECTION;  Surgeon: Leafy Ro, MD;  Location: [facility] ORS;  Service: General;  Laterality: N/A;   CHOLECYSTECTOMY     COLONOSCOPY WITH PROPOFOL N/A 03/11/2022   Procedure: COLONOSCOPY WITH PROPOFOL;  Surgeon: Wyline Mood, MD;  Location: [facility] ENDOSCOPY;  Service: Gastroenterology;  Laterality: N/A;   COLONOSCOPY WITH PROPOFOL N/A 11/17/2022   Procedure: COLONOSCOPY WITH PROPOFOL;  Surgeon: Wyline Mood, MD;  Location: [facility] ENDOSCOPY;  Service: Gastroenterology;  Laterality: N/A;   COLONOSCOPY WITH PROPOFOL N/A 11/24/2022   Procedure: COLONOSCOPY WITH PROPOFOL;  Surgeon: Wyline Mood, MD;  Location: [facility] ENDOSCOPY;  Service: Gastroenterology;  Laterality: N/A;   ESOPHAGOGASTRODUODENOSCOPY N/A 03/11/2022   Procedure: ESOPHAGOGASTRODUODENOSCOPY (EGD);  Surgeon: Wyline Mood, MD;  Location: [facility] ENDOSCOPY;  Service: Gastroenterology;  Laterality: N/A;   KNEE ARTHROSCOPY Left    Dr Monica Martinez   SPHINCTEROTOMY N/A 11/04/2022   Procedure:  SPHINCTEROTOMY;  Surgeon: Leafy Ro, MD;  Location: [facility] ORS;  Service: General;  Laterality: N/A;   Family History  Problem Relation Age of Onset   Cancer Father        germ cell (retroperitoneal)   Coronary artery disease Other        s/p stent (age 53)   Hypertension Paternal Grandfather    Kidney cancer Paternal Grandfather    Diabetes Paternal Grandfather    Diabetes Maternal Grandfather    Heart disease Cousin        MI - age 58   Social History   Socioeconomic History   Marital status: Married    Spouse name: Not on file   Number of children: 2   Years of education: 12   Highest education level: Not on file  Occupational History   Occupation: disabled    Employer: [employer]  Tobacco Use   Smoking status: Never   Smokeless tobacco: Never  Vaping Use   Vaping status: Never Used  Substance and Sexual Activity   Alcohol use: No    Alcohol/week: 0.0 standard drinks of alcohol   Drug use: Never   Sexual activity: Not on file  Other Topics Concern   Not on file  Social History Narrative   Lives with girlfriend in a one story home.  Has 2  sons.  Trying to get disability due to several back issues.  Education: high school.    Social Drivers of Corporate investment banker Strain: Low Risk  (02/28/2023)   Overall Financial Resource Strain (CARDIA)    Difficulty of Paying Living Expenses: Not hard at all  Food Insecurity: No Food Insecurity (02/28/2023)   Hunger Vital Sign    Worried About Running Out of Food in the Last Year: Never true    Ran Out of Food in the Last Year: Never true  Transportation Needs: No Transportation Needs (02/28/2023)   PRAPARE - Administrator, Civil Service (Medical): No    Lack of Transportation (Non-Medical): No  Physical Activity: Not on file  Stress: No Stress Concern Present (02/28/2023)   [employer] of Occupational Health - Occupational Stress Questionnaire    Feeling of Stress : Not at all  Social  Connections: Unknown (02/28/2023)   Social Connection and Isolation Panel [NHANES]    Frequency of Communication with Friends and Family: Not on file    Frequency of Social Gatherings with Friends and Family: Not on file    Attends Religious Services: Not on file    Active Member of Clubs or Organizations: Not on file    Attends Banker Meetings: Not on file    Marital Status: Married     Review of Systems     Objective:     There were no vitals taken for this visit. Wt Readings from Last 3 Encounters:  01/19/24 225 lb 12.8 oz (102.4 kg)  12/29/23 224 lb (101.6 kg)  12/27/23 224 lb (101.6 kg)    Physical Exam  {Perform Simple Foot Exam  Perform Detailed exam:1} {Insert foot Exam (Optional):30965}   Outpatient Encounter Medications as of 02/23/2024  Medication Sig   allopurinol (ZYLOPRIM) 300 MG tablet TAKE 1 TABLET BY MOUTH EVERY DAY   atorvastatin (LIPITOR) 40 MG tablet Take 1 tablet (40 mg total) by mouth daily.   bisacodyl (DULCOLAX) 5 MG EC tablet Take 5 mg by mouth daily as needed for moderate constipation.   cefadroxil (DURICEF) 500 MG capsule Take 1 capsule (500 mg total) by mouth 2 (two) times daily.   Continuous Glucose Sensor (FREESTYLE LIBRE 3 PLUS SENSOR) MISC Use to check blood glucose continuously. Change sensor every 15 days.   Continuous Glucose Sensor (FREESTYLE LIBRE 3 SENSOR) MISC Place 1 sensor on the skin every 14 days. Use to check glucose continuously   insulin degludec (TRESIBA FLEXTOUCH) 200 UNIT/ML FlexTouch Pen Inject 24 Units into the skin daily.   Insulin Pen Needle (PEN NEEDLES) 32G X 4 MM MISC Use to inject medication daily   metaxalone (SKELAXIN) 800 MG tablet Take 800 mg by mouth 3 (three) times daily.   metFORMIN (GLUCOPHAGE-XR) 500 MG 24 hr tablet Take 1 tablet (500 mg total) by mouth daily with breakfast.   metoprolol succinate (TOPROL-XL) 50 MG 24 hr tablet TAKE 2 TABLETS BY MOUTH DAILY WITH OR IMMEDIATELY FOLLOWING A MEAL.    mupirocin ointment (BACTROBAN) 2 % Apply 1 Application topically 2 (two) times daily.   oxyCODONE (ROXICODONE) 15 MG immediate release tablet Take 15 mg by mouth every 4 (four) hours.   pantoprazole (PROTONIX) 40 MG tablet TAKE 1 TABLET (40 MG TOTAL) BY MOUTH TWICE A DAY BEFORE MEALS   sildenafil (REVATIO) 20 MG tablet Take 1-5 tablets 1 hour prior to intercourse as needed   triamterene-hydrochlorothiazide (MAXZIDE-25) 37.5-25 MG tablet TAKE 1 TABLET BY MOUTH EVERY  DAY   No facility-administered encounter medications on file as of 02/23/2024.     Lab Results  Component Value Date   WBC 9.7 10/18/2023   HGB 14.0 10/18/2023   HCT 43.1 10/18/2023   PLT 154.0 10/18/2023   GLUCOSE 220 (H) 10/18/2023   CHOL 167 10/18/2023   TRIG 127.0 10/18/2023   HDL 52.40 10/18/2023   LDLDIRECT 53.0 09/08/2022   LDLCALC 89 10/18/2023   ALT 25 10/18/2023   AST 24 10/18/2023   NA 132 (L) 10/18/2023   K 4.2 10/18/2023   CL 100 10/18/2023   CREATININE 1.00 10/18/2023   BUN 13 10/18/2023   CO2 27 10/18/2023   TSH 2.87 06/17/2023   PSA 0.41 06/17/2023   INR 1.1 02/03/2023   HGBA1C 10.0 (H) 10/18/2023   MICROALBUR <0.7 06/17/2023    DG Abd 1 View Result Date: 10/18/2023 CLINICAL DATA:  Abdominal pain, constipation x1 week EXAM: ABDOMEN - 1 VIEW COMPARISON:  05/25/2021 FINDINGS: Stomach decompressed. A few gas distended nondilated left mid abdominal small bowel loops. Moderate proximal colonic fecal material, decompressed distally. No dilatation. Stable lumbosacral and right SI fixation hardware. Cholecystectomy clips. Bilateral pelvic phleboliths. IMPRESSION: Nonobstructive bowel gas pattern. Electronically Signed   By: Corlis Leak M.D.   On: 10/18/2023 12:45       Assessment & Plan:  There are no diagnoses linked to this encounter.   Dale , MD

## 2024-02-26 ENCOUNTER — Encounter: Payer: Self-pay | Admitting: Internal Medicine

## 2024-02-26 NOTE — Progress Notes (Signed)
 [name], male   DOB: Nov 16, 1970, 53 y.o.   MRN: [id number] Rescheduled appt.

## 2024-02-29 DIAGNOSIS — F1199 Opioid use, unspecified with unspecified opioid-induced disorder: Secondary | ICD-10-CM | POA: Diagnosis not present

## 2024-03-01 ENCOUNTER — Encounter: Payer: Self-pay | Admitting: Internal Medicine

## 2024-03-01 ENCOUNTER — Ambulatory Visit: Admitting: Internal Medicine

## 2024-03-01 ENCOUNTER — Telehealth: Payer: Self-pay

## 2024-03-01 ENCOUNTER — Other Ambulatory Visit: Payer: Self-pay | Admitting: Internal Medicine

## 2024-03-01 NOTE — Telephone Encounter (Signed)
 Patient had to cancel his appointment today because his dad is having bypass surgery. He has rescheduled for Monday.

## 2024-03-01 NOTE — Progress Notes (Deleted)
 Subjective:    [name], male    DOB: 1971-08-23, 53 y.o.   MRN: [id number]  Patient here for No chief complaint on file.   HPI Here to follow up regarding persistent lesion - stomach.    Past Medical History:  Diagnosis Date   Allergy    Anemia    Aortic atherosclerosis (HCC)    Chronic back pain    Chronic back pain    Cirrhosis of liver (HCC)    Complication of anesthesia    hard time to wake up x1   COVID-19    DM (diabetes mellitus), type 2 (HCC)    GERD (gastroesophageal reflux disease)    Gout    Hypercholesterolemia    Hypertension    Liver lesion    Migraines    Obesity    Sleep apnea    Subarachnoid hemorrhage (HCC) 11/2020   no surgery   Thrombocytopenia (HCC)    TIA (transient ischemic attack)    Past Surgical History:  Procedure Laterality Date   BACK SURGERY  08/12/2014   multiple   BOTOX INJECTION N/A 11/04/2022   Procedure: BOTOX INJECTION;  Surgeon: Leafy Ro, MD;  Location: [facility] ORS;  Service: General;  Laterality: N/A;   CHOLECYSTECTOMY     COLONOSCOPY WITH PROPOFOL N/A 03/11/2022   Procedure: COLONOSCOPY WITH PROPOFOL;  Surgeon: Wyline Mood, MD;  Location: [facility] ENDOSCOPY;  Service: Gastroenterology;  Laterality: N/A;   COLONOSCOPY WITH PROPOFOL N/A 11/17/2022   Procedure: COLONOSCOPY WITH PROPOFOL;  Surgeon: Wyline Mood, MD;  Location: [facility] ENDOSCOPY;  Service: Gastroenterology;  Laterality: N/A;   COLONOSCOPY WITH PROPOFOL N/A 11/24/2022   Procedure: COLONOSCOPY WITH PROPOFOL;  Surgeon: Wyline Mood, MD;  Location: [facility] ENDOSCOPY;  Service: Gastroenterology;  Laterality: N/A;   ESOPHAGOGASTRODUODENOSCOPY N/A 03/11/2022   Procedure: ESOPHAGOGASTRODUODENOSCOPY (EGD);  Surgeon: Wyline Mood, MD;  Location: [facility] ENDOSCOPY;  Service: Gastroenterology;  Laterality: N/A;   KNEE ARTHROSCOPY Left    Dr Monica Martinez   SPHINCTEROTOMY N/A 11/04/2022   Procedure: SPHINCTEROTOMY;  Surgeon: Leafy Ro, MD;  Location: [facility] ORS;  Service:  General;  Laterality: N/A;   Family History  Problem Relation Age of Onset   Cancer Father        germ cell (retroperitoneal)   Coronary artery disease Other        s/p stent (age 26)   Hypertension Paternal Grandfather    Kidney cancer Paternal Grandfather    Diabetes Paternal Grandfather    Diabetes Maternal Grandfather    Heart disease Cousin        MI - age 42   Social History   Socioeconomic History   Marital status: Married    Spouse name: Not on file   Number of children: 2   Years of education: 12   Highest education level: Not on file  Occupational History   Occupation: disabled    Employer: [employer]  Tobacco Use   Smoking status: Never   Smokeless tobacco: Never  Vaping Use   Vaping status: Never Used  Substance and Sexual Activity   Alcohol use: No    Alcohol/week: 0.0 standard drinks of alcohol   Drug use: Never   Sexual activity: Not on file  Other Topics Concern   Not on file  Social History Narrative   Lives with girlfriend in a one story home.  Has 2 sons.  Trying to get disability due to several back issues.  Education: high school.  Social Drivers of Corporate investment banker Strain: Low Risk  (02/28/2023)   Overall Financial Resource Strain (CARDIA)    Difficulty of Paying Living Expenses: Not hard at all  Food Insecurity: No Food Insecurity (02/28/2023)   Hunger Vital Sign    Worried About Running Out of Food in the Last Year: Never true    Ran Out of Food in the Last Year: Never true  Transportation Needs: No Transportation Needs (02/28/2023)   PRAPARE - Administrator, Civil Service (Medical): No    Lack of Transportation (Non-Medical): No  Physical Activity: Not on file  Stress: No Stress Concern Present (02/28/2023)   [employer] of Occupational Health - Occupational Stress Questionnaire    Feeling of Stress : Not at all  Social Connections: Unknown (02/28/2023)   Social Connection and Isolation Panel  [NHANES]    Frequency of Communication with Friends and Family: Not on file    Frequency of Social Gatherings with Friends and Family: Not on file    Attends Religious Services: Not on file    Active Member of Clubs or Organizations: Not on file    Attends Banker Meetings: Not on file    Marital Status: Married     Review of Systems     Objective:     There were no vitals taken for this visit. Wt Readings from Last 3 Encounters:  01/19/24 225 lb 12.8 oz (102.4 kg)  12/29/23 224 lb (101.6 kg)  12/27/23 224 lb (101.6 kg)    Physical Exam  {Perform Simple Foot Exam  Perform Detailed exam:1} {Insert foot Exam (Optional):30965}   Outpatient Encounter Medications as of 03/01/2024  Medication Sig   allopurinol (ZYLOPRIM) 300 MG tablet TAKE 1 TABLET BY MOUTH EVERY DAY   atorvastatin (LIPITOR) 40 MG tablet Take 1 tablet (40 mg total) by mouth daily.   bisacodyl (DULCOLAX) 5 MG EC tablet Take 5 mg by mouth daily as needed for moderate constipation.   cefadroxil (DURICEF) 500 MG capsule Take 1 capsule (500 mg total) by mouth 2 (two) times daily.   Continuous Glucose Sensor (FREESTYLE LIBRE 3 PLUS SENSOR) MISC Use to check blood glucose continuously. Change sensor every 15 days.   Continuous Glucose Sensor (FREESTYLE LIBRE 3 SENSOR) MISC Place 1 sensor on the skin every 14 days. Use to check glucose continuously   insulin degludec (TRESIBA FLEXTOUCH) 200 UNIT/ML FlexTouch Pen Inject 24 Units into the skin daily.   Insulin Pen Needle (PEN NEEDLES) 32G X 4 MM MISC Use to inject medication daily   metaxalone (SKELAXIN) 800 MG tablet Take 800 mg by mouth 3 (three) times daily.   metFORMIN (GLUCOPHAGE-XR) 500 MG 24 hr tablet Take 1 tablet (500 mg total) by mouth daily with breakfast.   metoprolol succinate (TOPROL-XL) 50 MG 24 hr tablet TAKE 2 TABLETS BY MOUTH DAILY WITH OR IMMEDIATELY FOLLOWING A MEAL.   mupirocin ointment (BACTROBAN) 2 % Apply 1 Application topically 2 (two)  times daily.   oxyCODONE (ROXICODONE) 15 MG immediate release tablet Take 15 mg by mouth every 4 (four) hours.   pantoprazole (PROTONIX) 40 MG tablet TAKE 1 TABLET (40 MG TOTAL) BY MOUTH TWICE A DAY BEFORE MEALS   sildenafil (REVATIO) 20 MG tablet Take 1-5 tablets 1 hour prior to intercourse as needed   triamterene-hydrochlorothiazide (MAXZIDE-25) 37.5-25 MG tablet TAKE 1 TABLET BY MOUTH EVERY DAY   No facility-administered encounter medications on file as of 03/01/2024.     Lab Results  Component Value Date   WBC 9.7 10/18/2023   HGB 14.0 10/18/2023   HCT 43.1 10/18/2023   PLT 154.0 10/18/2023   GLUCOSE 220 (H) 10/18/2023   CHOL 167 10/18/2023   TRIG 127.0 10/18/2023   HDL 52.40 10/18/2023   LDLDIRECT 53.0 09/08/2022   LDLCALC 89 10/18/2023   ALT 25 10/18/2023   AST 24 10/18/2023   NA 132 (L) 10/18/2023   K 4.2 10/18/2023   CL 100 10/18/2023   CREATININE 1.00 10/18/2023   BUN 13 10/18/2023   CO2 27 10/18/2023   TSH 2.87 06/17/2023   PSA 0.41 06/17/2023   INR 1.1 02/03/2023   HGBA1C 10.0 (H) 10/18/2023   MICROALBUR <0.7 06/17/2023    DG Abd 1 View Result Date: 10/18/2023 CLINICAL DATA:  Abdominal pain, constipation x1 week EXAM: ABDOMEN - 1 VIEW COMPARISON:  05/25/2021 FINDINGS: Stomach decompressed. A few gas distended nondilated left mid abdominal small bowel loops. Moderate proximal colonic fecal material, decompressed distally. No dilatation. Stable lumbosacral and right SI fixation hardware. Cholecystectomy clips. Bilateral pelvic phleboliths. IMPRESSION: Nonobstructive bowel gas pattern. Electronically Signed   By: Nicoletta Barrier M.D.   On: 10/18/2023 12:45       Assessment & Plan:  There are no diagnoses linked to this encounter.   Dellar Fenton, MD

## 2024-03-01 NOTE — Progress Notes (Signed)
 [name], male   DOB: [birth year], 53 y.o.   MRN: [id number] Did not come in for appt.

## 2024-03-01 NOTE — Telephone Encounter (Signed)
 Copied from CRM [id number]. Topic: Clinical - Medical Advice >> Mar 01, 2024 10:50 AM Cameron Sanchez wrote: Reason for CRM: THE PATIENT  ASKED TO SEND A MESSAGE TO DR SCOTT'S NURSE HE WANTS TO TALK HER ABOUT THE SORE ON HIS STOMACH.

## 2024-03-05 ENCOUNTER — Other Ambulatory Visit: Payer: Self-pay | Admitting: Internal Medicine

## 2024-03-05 ENCOUNTER — Encounter: Payer: Self-pay | Admitting: Internal Medicine

## 2024-03-05 ENCOUNTER — Ambulatory Visit
Admission: RE | Admit: 2024-03-05 | Discharge: 2024-03-05 | Disposition: A | Discharge status: Home / Self Care | Source: Ambulatory Visit | Attending: Internal Medicine | Admitting: Internal Medicine

## 2024-03-05 ENCOUNTER — Ambulatory Visit (INDEPENDENT_AMBULATORY_CARE_PROVIDER_SITE_OTHER): Admitting: Internal Medicine

## 2024-03-05 VITALS — BP 130/70 | HR 74 | Temp 98.0°F | Resp 16 | Ht 68.0 in | Wt 226.0 lb

## 2024-03-05 DIAGNOSIS — G8929 Other chronic pain: Secondary | ICD-10-CM

## 2024-03-05 DIAGNOSIS — E1149 Type 2 diabetes mellitus with other diabetic neurological complication: Secondary | ICD-10-CM | POA: Diagnosis not present

## 2024-03-05 DIAGNOSIS — K7460 Unspecified cirrhosis of liver: Secondary | ICD-10-CM

## 2024-03-05 DIAGNOSIS — K769 Liver disease, unspecified: Secondary | ICD-10-CM

## 2024-03-05 DIAGNOSIS — L989 Disorder of the skin and subcutaneous tissue, unspecified: Secondary | ICD-10-CM

## 2024-03-05 DIAGNOSIS — I10 Essential (primary) hypertension: Secondary | ICD-10-CM

## 2024-03-05 DIAGNOSIS — E7800 Pure hypercholesterolemia, unspecified: Secondary | ICD-10-CM

## 2024-03-05 DIAGNOSIS — N50812 Left testicular pain: Secondary | ICD-10-CM | POA: Insufficient documentation

## 2024-03-05 DIAGNOSIS — I700 Atherosclerosis of aorta: Secondary | ICD-10-CM

## 2024-03-05 DIAGNOSIS — M545 Low back pain, unspecified: Secondary | ICD-10-CM

## 2024-03-05 DIAGNOSIS — K5900 Constipation, unspecified: Secondary | ICD-10-CM

## 2024-03-05 DIAGNOSIS — N433 Hydrocele, unspecified: Secondary | ICD-10-CM | POA: Diagnosis not present

## 2024-03-05 DIAGNOSIS — Z7984 Long term (current) use of oral hypoglycemic drugs: Secondary | ICD-10-CM

## 2024-03-05 LAB — CBC WITH DIFFERENTIAL/PLATELET
Basophils Absolute: 0 10*3/uL (ref 0.0–0.1)
Basophils Relative: 0.4 % (ref 0.0–3.0)
Eosinophils Absolute: 0.1 10*3/uL (ref 0.0–0.7)
Eosinophils Relative: 1.3 % (ref 0.0–5.0)
HCT: 39.5 % (ref 39.0–52.0)
Hemoglobin: 12.7 g/dL — ABNORMAL LOW (ref 13.0–17.0)
Lymphocytes Relative: 31.6 % (ref 12.0–46.0)
Lymphs Abs: 2.1 10*3/uL (ref 0.7–4.0)
MCHC: 32 g/dL (ref 30.0–36.0)
MCV: 76.2 fl — ABNORMAL LOW (ref 78.0–100.0)
Monocytes Absolute: 0.6 10*3/uL (ref 0.1–1.0)
Monocytes Relative: 9.4 % (ref 3.0–12.0)
Neutro Abs: 3.7 10*3/uL (ref 1.4–7.7)
Neutrophils Relative %: 57.3 % (ref 43.0–77.0)
Platelets: 117 10*3/uL — ABNORMAL LOW (ref 150.0–400.0)
RBC: 5.19 Mil/uL (ref 4.22–5.81)
RDW: 16.5 % — ABNORMAL HIGH (ref 11.5–15.5)
WBC: 6.5 10*3/uL (ref 4.0–10.5)

## 2024-03-05 LAB — LIPID PANEL
Cholesterol: 122 mg/dL (ref 0–200)
HDL: 42.7 mg/dL (ref >39.00)
LDL Cholesterol: 35 mg/dL (ref 0–99)
NonHDL: 79.56
Total CHOL/HDL Ratio: 3
Triglycerides: 222 mg/dL — ABNORMAL HIGH (ref 0.0–149.0)
VLDL: 44.4 mg/dL — ABNORMAL HIGH (ref 0.0–40.0)

## 2024-03-05 LAB — HEPATIC FUNCTION PANEL
ALT: 22 U/L (ref 0–53)
AST: 24 U/L (ref 0–37)
Albumin: 4.4 g/dL (ref 3.5–5.2)
Alkaline Phosphatase: 66 U/L (ref 39–117)
Bilirubin, Direct: 0.1 mg/dL (ref 0.0–0.3)
Total Bilirubin: 0.6 mg/dL (ref 0.2–1.2)
Total Protein: 7.3 g/dL (ref 6.0–8.3)

## 2024-03-05 LAB — BASIC METABOLIC PANEL WITH GFR
BUN: 8 mg/dL (ref 6–23)
CO2: 25 meq/L (ref 19–32)
Calcium: 9.2 mg/dL (ref 8.4–10.5)
Chloride: 98 meq/L (ref 96–112)
Creatinine, Ser: 1.03 mg/dL (ref 0.40–1.50)
GFR: 82.17 mL/min (ref >60.00)
Glucose, Bld: 298 mg/dL — ABNORMAL HIGH (ref 70–99)
Potassium: 4.2 meq/L (ref 3.5–5.1)
Sodium: 132 meq/L — ABNORMAL LOW (ref 135–145)

## 2024-03-05 LAB — HEMOGLOBIN A1C: Hgb A1c MFr Bld: 11 % — ABNORMAL HIGH (ref 4.6–6.5)

## 2024-03-05 NOTE — Assessment & Plan Note (Signed)
 Saw GI (Dr Antony Baumgartner) - 01/2023 - recommended MRI of the liver in 07/05/2023 to screen for [facility].  Still has not followed up with GI.  Have discussed again today the need to follow up.

## 2024-03-05 NOTE — Progress Notes (Signed)
 Subjective:    [name], male    DOB: 09/17/1971, 53 y.o.   MRN: [id number]  Patient here for  Chief Complaint  Patient presents with   Wound Check    HPI Work in appt - work in for reevaluation of lesion - abdominal wall. Evaluated 12/21/23 - instructed to continue doxycycline  (that he had started after placement of spinal cord stimulator). F/u 12/27/23 - cefadroxil  was added. Reevaluated 01/19/24 - improved. Prescribed bactroban . His diabetes - poorly controlled. On tresiba  28 units q day. Did not tolerate ozempic . Taking metformin . Not checking sugars. Trying to watch his diet. Has cut down sweet tea intake. Breathing stable. Lesion - abdomen. Better. Still has been applying bandage and bactroban . Bowels are moving better. He does report increased pain - left testicle. Testicle - larger than right.    Past Medical History:  Diagnosis Date   Allergy    Anemia    Aortic atherosclerosis (HCC)    Chronic back pain    Chronic back pain    Cirrhosis of liver (HCC)    Complication of anesthesia    hard time to wake up x1   COVID-19    DM (diabetes mellitus), type 2 (HCC)    GERD (gastroesophageal reflux disease)    Gout    Hypercholesterolemia    Hypertension    Liver lesion    Migraines    Obesity    Sleep apnea    Subarachnoid hemorrhage (HCC) 11/2020   no surgery   Thrombocytopenia (HCC)    TIA (transient ischemic attack)    Past Surgical History:  Procedure Laterality Date   BACK SURGERY  08/12/2014   multiple   BOTOX  INJECTION N/A 11/04/2022   Procedure: BOTOX  INJECTION;  Surgeon: Alben Alma, MD;  Location: [facility] ORS;  Service: General;  Laterality: N/A;   CHOLECYSTECTOMY     COLONOSCOPY WITH PROPOFOL  N/A 03/11/2022   Procedure: COLONOSCOPY WITH PROPOFOL ;  Surgeon: Luke Salaam, MD;  Location[facility] ENDOSCOPY;  Service: Gastroenterology;  Laterality: N/A;   COLONOSCOPY WITH PROPOFOL  N/A 11/17/2022   Procedure: COLONOSCOPY WITH PROPOFOL ;  Surgeon: Luke Salaam, MD;  Location: [facility] ENDOSCOPY;  Service: Gastroenterology;  Laterality: N/A;   COLONOSCOPY WITH PROPOFOL  N/A 11/24/2022   Procedure: COLONOSCOPY WITH PROPOFOL ;  Surgeon: Luke Salaam, MD;  Location: [facility] ENDOSCOPY;  Service: Gastroenterology;  Laterality: N/A;   ESOPHAGOGASTRODUODENOSCOPY N/A 03/11/2022   Procedure: ESOPHAGOGASTRODUODENOSCOPY (EGD);  Surgeon: Luke Salaam, MD;  Location: [facility] ENDOSCOPY;  Service: Gastroenterology;  Laterality: N/A;   KNEE ARTHROSCOPY Left    Dr Karla Oven   SPHINCTEROTOMY N/A 11/04/2022   Procedure: SPHINCTEROTOMY;  Surgeon: Alben Alma, MD;  Location: [facility] ORS;  Service: General;  Laterality: N/A;   Family History  Problem Relation Age of Onset   Cancer Father        germ cell (retroperitoneal)   Coronary artery disease Other        s/p stent (age 38)   Hypertension Paternal Grandfather    Kidney cancer Paternal Grandfather    Diabetes Paternal Grandfather    Diabetes Maternal Grandfather    Heart disease Cousin        MI - age 39   Social History   Socioeconomic History   Marital status: Married    Spouse name: Not on file   Number of children: 2   Years of education: 12   Highest education level: Not on file  Occupational History   Occupation: disabled  Employer: [employer]  Tobacco Use   Smoking status: Never   Smokeless tobacco: Never  Vaping Use   Vaping status: Never Used  Substance and Sexual Activity   Alcohol use: No    Alcohol/week: 0.0 standard drinks of alcohol   Drug use: Never   Sexual activity: Not on file  Other Topics Concern   Not on file  Social History Narrative   Lives with girlfriend in a one story home.  Has 2 sons.  Trying to get disability due to several back issues.  Education: high school.    Social Drivers of Corporate investment banker Strain: Low Risk  (02/28/2023)   Overall Financial Resource Strain (CARDIA)    Difficulty of Paying Living Expenses: Not hard at all  Food Insecurity: No  Food Insecurity (02/28/2023)   Hunger Vital Sign    Worried About Running Out of Food in the Last Year: Never true    Ran Out of Food in the Last Year: Never true  Transportation Needs: No Transportation Needs (02/28/2023)   PRAPARE - Administrator, Civil Service (Medical): No    Lack of Transportation (Non-Medical): No  Physical Activity: Not on file  Stress: No Stress Concern Present (02/28/2023)   [employer] of Occupational Health - Occupational Stress Questionnaire    Feeling of Stress : Not at all  Social Connections: Unknown (02/28/2023)   Social Connection and Isolation Panel [NHANES]    Frequency of Communication with Friends and Family: Not on file    Frequency of Social Gatherings with Friends and Family: Not on file    Attends Religious Services: Not on file    Active Member of Clubs or Organizations: Not on file    Attends Banker Meetings: Not on file    Marital Status: Married     Review of Systems  Constitutional:  Negative for appetite change and fever.  HENT:  Negative for congestion and sinus pressure.   Respiratory:  Negative for cough, chest tightness and shortness of breath.   Cardiovascular:  Negative for chest pain, palpitations and leg swelling.  Gastrointestinal:  Negative for abdominal pain, diarrhea, nausea and vomiting.  Genitourinary:  Negative for difficulty urinating and dysuria.       Increased left testicular pain.   Musculoskeletal:  Positive for back pain. Negative for joint swelling and myalgias.  Skin:  Negative for color change and rash.       Healing lesion - lower abdomen.   Neurological:  Negative for dizziness and headaches.  Psychiatric/Behavioral:  Negative for agitation and dysphoric mood.        Objective:     BP 130/70   Pulse 74   Temp 98 F (36.7 C)   Resp 16   Ht 5\' 8"  (1.727 m)   Wt 226 lb (102.5 kg)   SpO2 98%   BMI 34.36 kg/m  Wt Readings from Last 3 Encounters:  03/05/24 226 lb  (102.5 kg)  01/19/24 225 lb 12.8 oz (102.4 kg)  12/29/23 224 lb (101.6 kg)    Physical Exam Vitals reviewed.  Constitutional:      General: He is not in acute distress.    Appearance: Normal appearance. He is well-developed.  HENT:     Head: Normocephalic and atraumatic.     Right Ear: External ear normal.     Left Ear: External ear normal.     Mouth/Throat:     Pharynx: No oropharyngeal exudate or posterior oropharyngeal  erythema.  Eyes:     General: No scleral icterus.       Right eye: No discharge.        Left eye: No discharge.     Conjunctiva/sclera: Conjunctivae normal.  Cardiovascular:     Rate and Rhythm: Normal rate and regular rhythm.  Pulmonary:     Effort: Pulmonary effort is normal. No respiratory distress.     Breath sounds: Normal breath sounds.  Abdominal:     General: Bowel sounds are normal.     Palpations: Abdomen is soft.     Tenderness: There is no abdominal tenderness.  Genitourinary:    Comments: Left testicle larger than right. Increased pain to palpation. Left testicle more firm than right.  Musculoskeletal:        General: No swelling or tenderness.     Cervical back: Neck supple. No tenderness.  Lymphadenopathy:     Cervical: No cervical adenopathy.  Skin:    Findings: No rash.     Comments: Closed healing lesion - lower abdomen.  No surrounding erythema.   Neurological:     Mental Status: He is alert.  Psychiatric:        Mood and Affect: Mood normal.        Behavior: Behavior normal.         Outpatient Encounter Medications as of 03/05/2024  Medication Sig   allopurinol  (ZYLOPRIM ) 300 MG tablet TAKE 1 TABLET BY MOUTH EVERY DAY   atorvastatin  (LIPITOR) 40 MG tablet Take 1 tablet (40 mg total) by mouth daily.   bisacodyl (DULCOLAX) 5 MG EC tablet Take 5 mg by mouth daily as needed for moderate constipation.   cefadroxil  (DURICEF) 500 MG capsule Take 1 capsule (500 mg total) by mouth 2 (two) times daily.   Continuous Glucose Sensor  (FREESTYLE LIBRE 3 PLUS SENSOR) MISC Use to check blood glucose continuously. Change sensor every 15 days.   insulin  degludec (TRESIBA  FLEXTOUCH) 200 UNIT/ML FlexTouch Pen Inject 24 Units into the skin daily.   Insulin  Pen Needle (PEN NEEDLES) 32G X 4 MM MISC Use to inject medication daily   metaxalone (SKELAXIN) 800 MG tablet Take 800 mg by mouth 3 (three) times daily.   metFORMIN  (GLUCOPHAGE -XR) 500 MG 24 hr tablet Take 1 tablet (500 mg total) by mouth daily with breakfast.   metoprolol  succinate (TOPROL -XL) 50 MG 24 hr tablet TAKE 2 TABLETS BY MOUTH DAILY WITH OR IMMEDIATELY FOLLOWING A MEAL.   mupirocin  ointment (BACTROBAN ) 2 % Apply 1 Application topically 2 (two) times daily.   oxyCODONE  (ROXICODONE ) 15 MG immediate release tablet Take 15 mg by mouth every 4 (four) hours.   pantoprazole  (PROTONIX ) 40 MG tablet TAKE 1 TABLET (40 MG TOTAL) BY MOUTH TWICE A DAY BEFORE MEALS   sildenafil  (REVATIO ) 20 MG tablet Take 1-5 tablets 1 hour prior to intercourse as needed   triamterene -hydrochlorothiazide (MAXZIDE-25) 37.5-25 MG tablet TAKE 1 TABLET BY MOUTH EVERY DAY   [DISCONTINUED] Continuous Glucose Sensor (FREESTYLE LIBRE 3 SENSOR) MISC Place 1 sensor on the skin every 14 days. Use to check glucose continuously   No facility-administered encounter medications on file as of 03/05/2024.     Lab Results  Component Value Date   WBC 9.7 10/18/2023   HGB 14.0 10/18/2023   HCT 43.1 10/18/2023   PLT 154.0 10/18/2023   GLUCOSE 220 (H) 10/18/2023   CHOL 167 10/18/2023   TRIG 127.0 10/18/2023   HDL 52.40 10/18/2023   LDLDIRECT 53.0 09/08/2022   LDLCALC 89 10/18/2023  ALT 25 10/18/2023   AST 24 10/18/2023   NA 132 (L) 10/18/2023   K 4.2 10/18/2023   CL 100 10/18/2023   CREATININE 1.00 10/18/2023   BUN 13 10/18/2023   CO2 27 10/18/2023   TSH 2.87 06/17/2023   PSA 0.41 06/17/2023   INR 1.1 02/03/2023   HGBA1C 10.0 (H) 10/18/2023   MICROALBUR <0.7 06/17/2023    DG Abd 1 View Result Date:  10/18/2023 CLINICAL DATA:  Abdominal pain, constipation x1 week EXAM: ABDOMEN - 1 VIEW COMPARISON:  05/25/2021 FINDINGS: Stomach decompressed. A few gas distended nondilated left mid abdominal small bowel loops. Moderate proximal colonic fecal material, decompressed distally. No dilatation. Stable lumbosacral and right SI fixation hardware. Cholecystectomy clips. Bilateral pelvic phleboliths. IMPRESSION: Nonobstructive bowel gas pattern. Electronically Signed   By: Nicoletta Barrier M.D.   On: 10/18/2023 12:45       Assessment & Plan:  Pain in left testicle Assessment & Plan: Increased pain and firmness left testicle.  Present x 2 weeks. Obtain ultrasound. Discussed urology evaluation.   Orders: -     US  SCROTUM; Future  Hypercholesterolemia Assessment & Plan: Was on lipitor. Low cholesterol diet and exercise.  Follow lipid panel and liver function tests.  Statin stopped while pursuing liver w/up.  Follow lipid panel. Check liver panel today.   Orders: -     Lipid panel -     Hepatic function panel -     CBC with Differential/Platelet  Type 2 diabetes mellitus with neurological complications [facility]) Assessment & Plan: Have dscussed diet and exercise.  On tresiba  - 28 units.  Check met b and A1c today.   Did not tolerate .5mg  ozempic  - increased constipation issues. Has started back on metformin . Trying to do better watching diet. Has decreased intake of sweet tea.  Not checking sugars.  Discussed diet and exercise. Have discussed importance of checking sugars and getting sugars under control. Check met b and A1c today.   Orders: -     Hemoglobin A1c -     Basic metabolic panel with GFR  Aortic atherosclerosis (HCC) Assessment & Plan: Off cholesterol medication currently. Obtain liver panel   Cirrhosis of liver without ascites, unspecified hepatic cirrhosis type [facility]) Assessment & Plan: Saw GI (Dr Antony Baumgartner) - 01/2023 - recommended MRI of the liver in 07/05/2023 to screen for [facility]. Requires  hepatitis B vaccination - states receive first.  Have discussed the need for f/u with GI. Check liver panel today.    Chronic low back pain, unspecified back pain laterality, unspecified whether sciatica present Assessment & Plan: Followed by pain clinic. On oxycodone .    Constipation, unspecified constipation type Assessment & Plan: Takes narcotic pain medication. Have discussed bowel regimen - to keep bowels moving.  Saw GI.  Colonoscopy 03/11/22 - Four 2 to 3 mm polyps in the cecum, removed with a cold biopsy forceps. Resected and retrieved. Two 4 to 6 mm polyps in the cecum, removed with a cold snare. Resected and retrieved. Three 5 to 7 mm polyps in the ascending colon, removed with a cold snare. Resected and retrieved. One 15 mm polyp in the ascending colon, removed with mucosal resection. Resected and retrieved. Clip was placed. The examination was otherwise normal on direct and retroflexion views. Mucosal resection was performed. Resection and retrieval were complete. Continue f/u with GI.  Recommended f/u colonoscopy 6-8 months. Colonoscopy 11/2022 - normal.  Recommended f/u in one year. Has not follow up. Have discussed. States bowels are doing better.  Primary hypertension Assessment & Plan: Blood pressure doing well.  Continue triam/hctz and metoprolol .  Follow pressures.  Follow metabolic panel.    Liver lesion Assessment & Plan: Saw GI (Dr Antony Baumgartner) - 01/2023 - recommended MRI of the liver in 07/05/2023 to screen for [facility].  Still has not followed up with GI.  Have discussed again today the need to follow up.    Skin lesion Assessment & Plan: Skin lesion - lower abdominal wall. Appears to be healed. Improved. Hold on bactroban . Hold on applying bandages. Leave open. Follow.       Dellar Fenton, MD

## 2024-03-05 NOTE — Assessment & Plan Note (Signed)
 Takes narcotic pain medication. Have discussed bowel regimen - to keep bowels moving.  Saw GI.  Colonoscopy 03/11/22 - Four 2 to 3 mm polyps in the cecum, removed with a cold biopsy forceps. Resected and retrieved. Two 4 to 6 mm polyps in the cecum, removed with a cold snare. Resected and retrieved. Three 5 to 7 mm polyps in the ascending colon, removed with a cold snare. Resected and retrieved. One 15 mm polyp in the ascending colon, removed with mucosal resection. Resected and retrieved. Clip was placed. The examination was otherwise normal on direct and retroflexion views. Mucosal resection was performed. Resection and retrieval were complete. Continue f/u with GI.  Recommended f/u colonoscopy 6-8 months. Colonoscopy 11/2022 - normal.  Recommended f/u in one year. Has not follow up. Have discussed. States bowels are doing better.

## 2024-03-05 NOTE — Assessment & Plan Note (Signed)
 Have dscussed diet and exercise.  On tresiba  - 28 units.  Check met b and A1c today.   Did not tolerate .5mg  ozempic  - increased constipation issues. Has started back on metformin . Trying to do better watching diet. Has decreased intake of sweet tea.  Not checking sugars.  Discussed diet and exercise. Have discussed importance of checking sugars and getting sugars under control. Check met b and A1c today.

## 2024-03-05 NOTE — Assessment & Plan Note (Signed)
 Skin lesion - lower abdominal wall. Appears to be healed. Improved. Hold on bactroban . Hold on applying bandages. Leave open. Follow.

## 2024-03-05 NOTE — Assessment & Plan Note (Signed)
Blood pressure doing well.  Continue triam/hctz and metoprolol.  Follow pressures.  Follow metabolic panel.

## 2024-03-05 NOTE — Assessment & Plan Note (Signed)
 Saw GI (Dr Antony Baumgartner) - 01/2023 - recommended MRI of the liver in 07/05/2023 to screen for [facility]. Requires hepatitis B vaccination - states receive first.  Have discussed the need for f/u with GI. Check liver panel today.

## 2024-03-05 NOTE — Assessment & Plan Note (Signed)
 Increased pain and firmness left testicle.  Present x 2 weeks. Obtain ultrasound. Discussed urology evaluation.

## 2024-03-05 NOTE — Assessment & Plan Note (Signed)
 Off cholesterol medication currently. Obtain liver panel

## 2024-03-05 NOTE — Assessment & Plan Note (Signed)
 Followed by pain clinic. On oxycodone .

## 2024-03-05 NOTE — Assessment & Plan Note (Signed)
 Was on lipitor. Low cholesterol diet and exercise.  Follow lipid panel and liver function tests.  Statin stopped while pursuing liver w/up.  Follow lipid panel. Check liver panel today.

## 2024-03-06 ENCOUNTER — Other Ambulatory Visit: Payer: Self-pay | Admitting: Internal Medicine

## 2024-03-06 DIAGNOSIS — K7460 Unspecified cirrhosis of liver: Secondary | ICD-10-CM

## 2024-03-06 DIAGNOSIS — D649 Anemia, unspecified: Secondary | ICD-10-CM

## 2024-03-06 DIAGNOSIS — E1149 Type 2 diabetes mellitus with other diabetic neurological complication: Secondary | ICD-10-CM

## 2024-03-06 DIAGNOSIS — D696 Thrombocytopenia, unspecified: Secondary | ICD-10-CM

## 2024-03-06 NOTE — Addendum Note (Signed)
 Addended by: Raejean Bullock on: 03/06/2024 01:40 PM   Modules accepted: Orders

## 2024-03-06 NOTE — Progress Notes (Signed)
Order placed for endocrinology referral.  

## 2024-03-06 NOTE — Progress Notes (Signed)
Order placed for f/u labs.  

## 2024-03-07 ENCOUNTER — Ambulatory Visit: Admitting: Urology

## 2024-03-07 VITALS — BP 127/76 | HR 74 | Ht 68.0 in | Wt 224.1 lb

## 2024-03-07 DIAGNOSIS — N433 Hydrocele, unspecified: Secondary | ICD-10-CM | POA: Diagnosis not present

## 2024-03-07 DIAGNOSIS — N50812 Left testicular pain: Secondary | ICD-10-CM | POA: Diagnosis not present

## 2024-03-07 DIAGNOSIS — F1199 Opioid use, unspecified with unspecified opioid-induced disorder: Secondary | ICD-10-CM | POA: Diagnosis not present

## 2024-03-07 LAB — URINALYSIS, COMPLETE
Bilirubin, UA: NEGATIVE
Ketones, UA: NEGATIVE
Leukocytes,UA: NEGATIVE
Nitrite, UA: NEGATIVE
Protein,UA: NEGATIVE
RBC, UA: NEGATIVE
Specific Gravity, UA: 1.015 (ref 1.005–1.030)
Urobilinogen, Ur: 0.2 mg/dL (ref 0.2–1.0)
pH, UA: 7 (ref 5.0–7.5)

## 2024-03-07 LAB — MICROSCOPIC EXAMINATION: Bacteria, UA: NONE SEEN

## 2024-03-07 MED ORDER — DOXYCYCLINE HYCLATE 100 MG PO CAPS: 100.0000 mg | ORAL_CAPSULE | Freq: Two times a day (BID) | ORAL | 0 refills | Status: DC

## 2024-03-07 NOTE — Progress Notes (Signed)
 [name],acting as a scribe for Cameron Gimenez, MD.,have documented all relevant documentation on the behalf of Cameron Gimenez, MD,as directed by  Cameron Gimenez, MD while in the presence of Cameron Gimenez, MD.  03/07/24 3:35 PM   Cameron Sanchez 1971-06-30 [id number]  Referring provider: Dellar Fenton, MD [address] [place],  [place] [address]  Chief Complaint  Patient presents with   Testicle Pain    HPI:  53 year old male with a personal history of erectile dysfunction presents today for further evaluation of left testicular pain.   He was last seen in June 2024 for erectile dysfunction. He initially saw his PCP, who ordered a scrotal ultrasound that showed a moderate left hydrocele and an indeterminate, ill-defined heterogeneous layer on the anterior left scrotal wall, as well as microlithiasis; otherwise, no significant pathology was noted. No urine was checked.   He reports that the left testicular pain has occurred before, years ago, and resolved on its own, but this episode has persisted and is more bothersome. He denies any recent trauma or injury to the area. He describes an intermittent awkward smell, which resolved and then recurred this morning. He denies current hematuria, dysuria, or urinary urgency, but does report a remote history of blood with intercourse, last evaluated in 2013. He is sexually active with his wife in a monogamous relationship and denies any risk factors for sexually transmitted infections.   He reports that ibuprofen makes him sleepy, which he finds unusual. He is concerned about the persistent pain and is seeking further evaluation and treatment at this time.  PMH: Past Medical History:  Diagnosis Date   Allergy    Anemia    Aortic atherosclerosis (HCC)    Chronic back pain    Chronic back pain    Cirrhosis of liver (HCC)    Complication of anesthesia    hard time to wake up x1   COVID-19    DM (diabetes  mellitus), type 2 (HCC)    GERD (gastroesophageal reflux disease)    Gout    Hypercholesterolemia    Hypertension    Liver lesion    Migraines    Obesity    Sleep apnea    Subarachnoid hemorrhage (HCC) 11/2020   no surgery   Thrombocytopenia (HCC)    TIA (transient ischemic attack)     Surgical History: Past Surgical History:  Procedure Laterality Date   BACK SURGERY  08/12/2014   multiple   BOTOX  INJECTION N/A 11/04/2022   Procedure: BOTOX  INJECTION;  Surgeon: Alben Alma, MD;  Location: [facility] ORS;  Service: General;  Laterality: N/A;   CHOLECYSTECTOMY     COLONOSCOPY WITH PROPOFOL  N/A 03/11/2022   Procedure: COLONOSCOPY WITH PROPOFOL ;  Surgeon: Luke Salaam, MD;  Location: [facility] ENDOSCOPY;  Service: Gastroenterology;  Laterality: N/A;   COLONOSCOPY WITH PROPOFOL  N/A 11/17/2022   Procedure: COLONOSCOPY WITH PROPOFOL ;  Surgeon: Luke Salaam, MD;  Location: [facility] ENDOSCOPY;  Service: Gastroenterology;  Laterality: N/A;   COLONOSCOPY WITH PROPOFOL  N/A 11/24/2022   Procedure: COLONOSCOPY WITH PROPOFOL ;  Surgeon: Luke Salaam, MD;  Location: [facility]COPY;  Service: Gastroenterology;  Laterality: N/A;   ESOPHAGOGASTRODUODENOSCOPY N/A 03/11/2022   Procedure: ESOPHAGOGASTRODUODENOSCOPY (EGD);  Surgeon: Luke Salaam, MD;  Location: [facility] ENDOSCOPY;  Service: Gastroenterology;  Laterality: N/A;   KNEE ARTHROSCOPY Left    Dr Karla Oven   SPHINCTEROTOMY N/A 11/04/2022   Procedure: SPHINCTEROTOMY;  Surgeon: Alben Alma, MD;  Location: [facility] ORS;  Service: General;  Laterality:  N/A;    Home Medications:  Allergies as of 03/07/2024   No Known Allergies      Medication List        Accurate as of March 07, 2024  3:35 PM. If you have any questions, ask your nurse or doctor.          STOP taking these medications    cefadroxil  500 MG capsule Commonly known as: DURICEF   mupirocin  ointment 2 % Commonly known as: BACTROBAN    pantoprazole  40 MG tablet Commonly known as: PROTONIX         TAKE these medications    allopurinol  300 MG tablet Commonly known as: ZYLOPRIM  TAKE 1 TABLET BY MOUTH EVERY DAY   atorvastatin  40 MG tablet Commonly known as: LIPITOR Take 1 tablet (40 mg total) by mouth daily.   bisacodyl 5 MG EC tablet Commonly known as: DULCOLAX Take 5 mg by mouth daily as needed for moderate constipation.   doxycycline  100 MG capsule Commonly known as: VIBRAMYCIN  Take 1 capsule (100 mg total) by mouth every 12 (twelve) hours.   FreeStyle Libre 3 Plus Sensor Misc Use to check blood glucose continuously. Change sensor every 15 days.   metaxalone 800 MG tablet Commonly known as: SKELAXIN Take 800 mg by mouth 3 (three) times daily.   metFORMIN  500 MG 24 hr tablet Commonly known as: GLUCOPHAGE -XR Take 1 tablet (500 mg total) by mouth daily with breakfast.   metoprolol  succinate 50 MG 24 hr tablet Commonly known as: TOPROL -XL TAKE 2 TABLETS BY MOUTH DAILY WITH OR IMMEDIATELY FOLLOWING A MEAL.   oxyCODONE  15 MG immediate release tablet Commonly known as: ROXICODONE  Take 15 mg by mouth every 4 (four) hours.   Pen Needles 32G X 4 MM Misc Use to inject medication daily   sildenafil  20 MG tablet Commonly known as: REVATIO  Take 1-5 tablets 1 hour prior to intercourse as needed   Tresiba  FlexTouch 200 UNIT/ML FlexTouch Pen Generic drug: insulin  degludec Inject 24 Units into the skin daily.   triamterene -hydrochlorothiazide 37.5-25 MG tablet Commonly known as: MAXZIDE-25 TAKE 1 TABLET BY MOUTH EVERY DAY         Family History: Family History  Problem Relation Age of Onset   Cancer Father        germ cell (retroperitoneal)   Coronary artery disease Other        s/p stent (age 51)   Hypertension Paternal Grandfather    Kidney cancer Paternal Grandfather    Diabetes Paternal Grandfather    Diabetes Maternal Grandfather    Heart disease Cousin        MI - age 66    Social History:  reports that he has never smoked. He has never used  smokeless tobacco. He reports that he does not drink alcohol and does not use drugs.   Physical Exam: BP 127/76   Pulse 74   Ht 5\' 8"  (1.727 m)   Wt 224 lb 2 oz (101.7 kg)   BMI 34.08 kg/m   Constitutional:  Alert and oriented, No acute distress. HEENT: [place] AT, moist mucus membranes.  Trachea midline, no masses. GU: Right testicle normal to palpation. Left testicle enlarged and tender with palpable fluid consistent with hydrocele. Testicle itself is palpable. Circumcised phallus. No mention of masses or lesions. Neurologic: Grossly intact, no focal deficits, moving all 4 extremities. Psychiatric: Normal mood and affect.  Pertinent Imaging: EXAM: SCROTAL ULTRASOUND   DOPPLER ULTRASOUND OF THE TESTICLES   TECHNIQUE: Complete ultrasound examination of the  testicles, epididymis, and other scrotal structures was performed. Color and spectral Doppler ultrasound were also utilized to evaluate blood flow to the testicles.   COMPARISON:  None Available.   FINDINGS: Right testicle   Measurements: 3.4 x 2.0 x 2.8 cm. No focal mass. Microlithiasis present.   Left testicle   Measurements: 3.5 x 1.8 x 2.3 cm. No focal mass. Microlithiasis is present.   Right epididymis:  Normal in size and appearance.   Left epididymis:  Normal in size and appearance.   Hydrocele: Moderate left hydrocele present. Echogenic focus in the right scrotum measuring 3 x 2 x 2 mm may represent scrotal pleural. Epididymal appendix noted on the left. Within the anterior left hemiscrotum anterior to the   Varicocele:  None visualized.   Other: Within the anterior left scrotal wall there is an ill-defined heterogeneous area measuring 7 x 4 x 4 mm, indeterminate 3 there is no significant vascularity in this region.   Pulsed Doppler interrogation of both testes demonstrates normal low resistance arterial and venous waveforms bilaterally.   IMPRESSION: 1. No evidence of testicular torsion. 2. Moderate  left hydrocele. 3. Bilateral testicular microlithiasis. 4. Ill-defined heterogeneous area in the anterior left scrotal wall measuring 7 x 4 x 4 mm, indeterminate. Recommend correlation with physical exam.     Electronically Signed   By: Tyron Gallon M.D.   On: 03/05/2024 18:49  This was personally reviewed and I agree with the radiologic interpretation.    Assessment & Plan:    1. Left testicular pain, likely epididymitis (with moderate reactive left hydrocele and heterogeneous scrotal wall lesion on imaging) - Differential includes: epididymitis (most likely), testicular torsion (less likely given subacute course and exam), tumor (less likely but heterogeneous lesion on imaging warrants follow-up), and chronic hydrocele. - Exam today: epididymal tenderness, palpable fluid, testicular enlargement, no masses, right testicle normal. - Start empiric doxycycline  for presumed epididymitis.  - Advise scrotal support and NSAIDs (ibuprofen) for pain and inflammation.  - Obtain urine sample for urinalysis and culture; adjust antibiotics if culture indicates alternative pathogen.  - Informed him that improvement may take several days; advise to return if symptoms worsen or do not improve. - Will monitor for resolution; if persistent pain or abnormal findings, consider repeat scrotal ultrasound and urology referral to further evaluate heterogeneous lesion and hydrocele.   Return in about 2 weeks (around 03/21/2024) for reassessment if no improvement after completion of antibiotics.  I have reviewed the above documentation for accuracy and completeness, and I agree with the above.   Cameron Gimenez, MD   [facility] [address] [place], [place] [address] [phone]

## 2024-03-09 DIAGNOSIS — Z79891 Long term (current) use of opiate analgesic: Secondary | ICD-10-CM | POA: Diagnosis not present

## 2024-03-09 DIAGNOSIS — G894 Chronic pain syndrome: Secondary | ICD-10-CM | POA: Diagnosis not present

## 2024-03-09 DIAGNOSIS — M961 Postlaminectomy syndrome, not elsewhere classified: Secondary | ICD-10-CM | POA: Diagnosis not present

## 2024-03-09 DIAGNOSIS — M545 Low back pain, unspecified: Secondary | ICD-10-CM | POA: Diagnosis not present

## 2024-03-10 LAB — CULTURE, URINE COMPREHENSIVE

## 2024-03-12 ENCOUNTER — Other Ambulatory Visit (INDEPENDENT_AMBULATORY_CARE_PROVIDER_SITE_OTHER)

## 2024-03-12 DIAGNOSIS — F1199 Opioid use, unspecified with unspecified opioid-induced disorder: Secondary | ICD-10-CM | POA: Diagnosis not present

## 2024-03-12 DIAGNOSIS — K7460 Unspecified cirrhosis of liver: Secondary | ICD-10-CM

## 2024-03-12 DIAGNOSIS — D696 Thrombocytopenia, unspecified: Secondary | ICD-10-CM | POA: Diagnosis not present

## 2024-03-12 DIAGNOSIS — D649 Anemia, unspecified: Secondary | ICD-10-CM

## 2024-03-12 LAB — CBC WITH DIFFERENTIAL/PLATELET
Basophils Absolute: 0 10*3/uL (ref 0.0–0.1)
Basophils Relative: 0.6 % (ref 0.0–3.0)
Eosinophils Absolute: 0.1 10*3/uL (ref 0.0–0.7)
Eosinophils Relative: 1.5 % (ref 0.0–5.0)
HCT: 39.8 % (ref 39.0–52.0)
Hemoglobin: 12.8 g/dL — ABNORMAL LOW (ref 13.0–17.0)
Lymphocytes Relative: 34.4 % (ref 12.0–46.0)
Lymphs Abs: 2.4 10*3/uL (ref 0.7–4.0)
MCHC: 32.1 g/dL (ref 30.0–36.0)
MCV: 76.6 fl — ABNORMAL LOW (ref 78.0–100.0)
Monocytes Absolute: 0.7 10*3/uL (ref 0.1–1.0)
Monocytes Relative: 9.3 % (ref 3.0–12.0)
Neutro Abs: 3.9 10*3/uL (ref 1.4–7.7)
Neutrophils Relative %: 54.2 % (ref 43.0–77.0)
Platelets: 129 10*3/uL — ABNORMAL LOW (ref 150.0–400.0)
RBC: 5.19 Mil/uL (ref 4.22–5.81)
RDW: 16.6 % — ABNORMAL HIGH (ref 11.5–15.5)
WBC: 7.1 10*3/uL (ref 4.0–10.5)

## 2024-03-12 LAB — PROTIME-INR
INR: 1.2 ratio — ABNORMAL HIGH (ref 0.8–1.0)
Prothrombin Time: 13 s (ref 9.6–13.1)

## 2024-03-12 LAB — VITAMIN B12: Vitamin B-12: 413 pg/mL (ref 211–911)

## 2024-03-15 ENCOUNTER — Other Ambulatory Visit: Payer: Self-pay | Admitting: Internal Medicine

## 2024-03-15 NOTE — Telephone Encounter (Signed)
 Copied from CRM [id number]. Topic: Clinical - Medication Refill >> Mar 15, 2024  3:53 PM Howard Macho wrote: Most Recent Primary Care Visit:  Provider: SCOTT, CHARLENE  Department: [facility]  Visit Type: OFFICE VISIT  Date: 03/05/2024  Medication: Insulin  Pen Needle (PEN NEEDLES) 32G X 4 MM MISC  Has the patient contacted their pharmacy? Yes (Agent: If no, request that the patient contact the pharmacy for the refill. If patient does not wish to contact the pharmacy document the reason why and proceed with request.) (Agent: If yes, when and what did the pharmacy advise?)  Is this the correct pharmacy for this prescription? Yes If no, delete pharmacy and type the correct one.  This is the patient's preferred pharmacy:  [id number] - [place], [place] - [address] [place] [address] Phone: [phone] Fax: [phone]  Has the prescription been filled recently? No  Is the patient out of the medication? Yes  Has the patient been seen for an appointment in the last year OR does the patient have an upcoming appointment? Yes  Can we respond through MyChart? Yes  Agent: Please be advised that Rx refills may take up to 3 business days. We ask that you follow-up with your pharmacy.

## 2024-03-16 DIAGNOSIS — M545 Low back pain, unspecified: Secondary | ICD-10-CM | POA: Diagnosis not present

## 2024-03-16 DIAGNOSIS — G894 Chronic pain syndrome: Secondary | ICD-10-CM | POA: Diagnosis not present

## 2024-03-16 DIAGNOSIS — Z79891 Long term (current) use of opiate analgesic: Secondary | ICD-10-CM | POA: Diagnosis not present

## 2024-03-16 DIAGNOSIS — M961 Postlaminectomy syndrome, not elsewhere classified: Secondary | ICD-10-CM | POA: Diagnosis not present

## 2024-03-16 MED ORDER — PEN NEEDLES 32G X 4 MM MISC: 2 refills | Status: AC

## 2024-03-21 DIAGNOSIS — F1199 Opioid use, unspecified with unspecified opioid-induced disorder: Secondary | ICD-10-CM | POA: Diagnosis not present

## 2024-03-28 DIAGNOSIS — F1199 Opioid use, unspecified with unspecified opioid-induced disorder: Secondary | ICD-10-CM | POA: Diagnosis not present

## 2024-03-30 ENCOUNTER — Ambulatory Visit: Payer: Self-pay

## 2024-04-03 ENCOUNTER — Ambulatory Visit

## 2024-04-03 NOTE — Progress Notes (Signed)
 Patient is in office today for a nurse visit for CGM Training. Patient was provided with instruction and demonstration on how to Apply CGM sensor and use receiver

## 2024-04-04 DIAGNOSIS — F1199 Opioid use, unspecified with unspecified opioid-induced disorder: Secondary | ICD-10-CM | POA: Diagnosis not present

## 2024-04-10 DIAGNOSIS — G588 Other specified mononeuropathies: Secondary | ICD-10-CM | POA: Diagnosis not present

## 2024-04-10 DIAGNOSIS — G894 Chronic pain syndrome: Secondary | ICD-10-CM | POA: Diagnosis not present

## 2024-04-10 DIAGNOSIS — M961 Postlaminectomy syndrome, not elsewhere classified: Secondary | ICD-10-CM | POA: Diagnosis not present

## 2024-04-10 DIAGNOSIS — Z79899 Other long term (current) drug therapy: Secondary | ICD-10-CM | POA: Diagnosis not present

## 2024-04-10 DIAGNOSIS — Z5181 Encounter for therapeutic drug level monitoring: Secondary | ICD-10-CM | POA: Diagnosis not present

## 2024-04-11 DIAGNOSIS — F1199 Opioid use, unspecified with unspecified opioid-induced disorder: Secondary | ICD-10-CM | POA: Diagnosis not present

## 2024-04-13 ENCOUNTER — Ambulatory Visit (INDEPENDENT_AMBULATORY_CARE_PROVIDER_SITE_OTHER): Admitting: Internal Medicine

## 2024-04-13 ENCOUNTER — Encounter: Payer: Self-pay | Admitting: Internal Medicine

## 2024-04-13 VITALS — BP 130/74 | HR 81 | Ht 68.0 in | Wt 219.6 lb

## 2024-04-13 DIAGNOSIS — K5900 Constipation, unspecified: Secondary | ICD-10-CM

## 2024-04-13 DIAGNOSIS — I700 Atherosclerosis of aorta: Secondary | ICD-10-CM | POA: Diagnosis not present

## 2024-04-13 DIAGNOSIS — K769 Liver disease, unspecified: Secondary | ICD-10-CM

## 2024-04-13 DIAGNOSIS — F439 Reaction to severe stress, unspecified: Secondary | ICD-10-CM | POA: Diagnosis not present

## 2024-04-13 DIAGNOSIS — E611 Iron deficiency: Secondary | ICD-10-CM | POA: Diagnosis not present

## 2024-04-13 DIAGNOSIS — I10 Essential (primary) hypertension: Secondary | ICD-10-CM

## 2024-04-13 DIAGNOSIS — E1149 Type 2 diabetes mellitus with other diabetic neurological complication: Secondary | ICD-10-CM | POA: Diagnosis not present

## 2024-04-13 DIAGNOSIS — K7460 Unspecified cirrhosis of liver: Secondary | ICD-10-CM | POA: Diagnosis not present

## 2024-04-13 DIAGNOSIS — G8929 Other chronic pain: Secondary | ICD-10-CM

## 2024-04-13 DIAGNOSIS — E7800 Pure hypercholesterolemia, unspecified: Secondary | ICD-10-CM

## 2024-04-13 DIAGNOSIS — G894 Chronic pain syndrome: Secondary | ICD-10-CM | POA: Diagnosis not present

## 2024-04-13 DIAGNOSIS — M545 Low back pain, unspecified: Secondary | ICD-10-CM | POA: Diagnosis not present

## 2024-04-13 DIAGNOSIS — K219 Gastro-esophageal reflux disease without esophagitis: Secondary | ICD-10-CM

## 2024-04-13 DIAGNOSIS — Z79891 Long term (current) use of opiate analgesic: Secondary | ICD-10-CM | POA: Diagnosis not present

## 2024-04-13 DIAGNOSIS — D696 Thrombocytopenia, unspecified: Secondary | ICD-10-CM

## 2024-04-13 DIAGNOSIS — M5137 Other intervertebral disc degeneration, lumbosacral region with discogenic back pain only: Secondary | ICD-10-CM | POA: Diagnosis not present

## 2024-04-13 DIAGNOSIS — M961 Postlaminectomy syndrome, not elsewhere classified: Secondary | ICD-10-CM | POA: Diagnosis not present

## 2024-04-13 MED ORDER — INSULIN ASPART 100 UNIT/ML IJ SOLN: INTRAMUSCULAR | 1 refills | Status: DC

## 2024-04-13 NOTE — Progress Notes (Signed)
 Subjective:    [name], male    DOB: February 18, 1971, 53 y.o.   MRN: [id number]  Patient here for  Chief Complaint  Patient presents with   Medical Management of Chronic Issues    HPI Here for a scheduled follow up - follow up regarding diabetes, hypercholesterolemia and hypertension. Recently evaluated for left testicular pain - saw urology - likely epididymitis with left hydrocele and scrotal wall lesion. Was placed on doxycycline . Increased stress related to his pain management. Is being followed by pain clinic. Per his report, they have adjusted some of the dosing of his medication. Also having testing to confirm if other etiologies to explain his symptoms. Blood sugars have been poorly controlled. Has not been checking his sugars previously. Did wear a sensor for the last couple of weeks. Time active 5/17 - 04/13/24 - 72%. Target range 15%, high 36% and very high 49%. Has been on tresiba . Did not take insulin  yesterday. States has been taking (otherwise). Discussed the need for monitoring diet and exercise as tolerated. Discussed importance of lower blood sugars. Discussed his previous liver findings and need for f/u with GI for further w/up and evaluation and need for better blood sugar control. Reports he eat breakfast 9-10:30. Eats evening meal 5-6pm. No there meals. No chest pain reported. Breathing stable. No abdominal pain reported. Bowels are doing better.    Past Medical History:  Diagnosis Date   Allergy    Anemia    Aortic atherosclerosis (HCC)    Chronic back pain    Chronic back pain    Cirrhosis of liver (HCC)    Complication of anesthesia    hard time to wake up x1   COVID-19    DM (diabetes mellitus), type 2 (HCC)    GERD (gastroesophageal reflux disease)    Gout    Hypercholesterolemia    Hypertension    Liver lesion    Migraines    Obesity    Sleep apnea    Subarachnoid hemorrhage (HCC) 11/2020   no surgery   Thrombocytopenia (HCC)    TIA  (transient ischemic attack)    Past Surgical History:  Procedure Laterality Date   BACK SURGERY  08/12/2014   multiple   BOTOX  INJECTION N/A 11/04/2022   Procedure: BOTOX  INJECTION;  Surgeon: Alben Alma, MD;  Location: [facility] ORS;  Service: General;  Laterality: N/A;   CHOLECYSTECTOMY     COLONOSCOPY WITH PROPOFOL  N/A 03/11/2022   Procedure: COLONOSCOPY WITH PROPOFOL ;  Surgeon: Luke Salaam, MD;  Location: [facility] ENDOSCOPY;  Service: Gastroenterology;  Laterality: N/A;   COLONOSCOPY WITH PROPOFOL  N/A 11/17/2022   Procedure: COLONOSCOPY WITH PROPOFOL ;  Surgeon: Luke Salaam, MD;  Location: [facility] ENDOSCOPY;  Service: Gastroenterology;  Laterality: N/A;   COLONOSCOPY WITH PROPOFOL  N/A 11/24/2022   Procedure: COLONOSCOPY WITH PROPOFOL ;  Surgeon: Luke Salaam, MD;  Location: [facility] ENDOSCOPY;  Service: Gastroenterology;  Laterality: N/A;   ESOPHAGOGASTRODUODENOSCOPY N/A 03/11/2022   Procedure: ESOPHAGOGASTRODUODENOSCOPY (EGD);  Surgeon: Luke Salaam, MD;  Location: [facility] ENDOSCOPY;  Service: Gastroenterology;  Laterality: N/A;   KNEE ARTHROSCOPY Left    Dr Karla Oven   SPHINCTEROTOMY N/A 11/04/2022   Procedure: SPHINCTEROTOMY;  Surgeon: Alben Alma, MD;  Location: [facility] ORS;  Service: General;  Laterality: N/A;   Family History  Problem Relation Age of Onset   Cancer Father        germ cell (retroperitoneal)   Coronary artery disease Other        s/p stent (age  45)   Hypertension Paternal Grandfather    Kidney cancer Paternal Grandfather    Diabetes Paternal Grandfather    Diabetes Maternal Grandfather    Heart disease Cousin        MI - age 36   Social History   Socioeconomic History   Marital status: Married    Spouse name: Not on file   Number of children: 2   Years of education: 12   Highest education level: Not on file  Occupational History   Occupation: disabled    Employer: [employer]  Tobacco Use   Smoking status: Never   Smokeless tobacco: Never  Vaping Use   Vaping  status: Never Used  Substance and Sexual Activity   Alcohol use: No    Alcohol/week: 0.0 standard drinks of alcohol   Drug use: Never   Sexual activity: Not on file  Other Topics Concern   Not on file  Social History Narrative   Lives with girlfriend in a one story home.  Has 2 sons.  Trying to get disability due to several back issues.  Education: high school.    Social Drivers of Corporate investment banker Strain: Low Risk  (02/28/2023)   Overall Financial Resource Strain (CARDIA)    Difficulty of Paying Living Expenses: Not hard at all  Food Insecurity: No Food Insecurity (02/28/2023)   Hunger Vital Sign    Worried About Running Out of Food in the Last Year: Never true    Ran Out of Food in the Last Year: Never true  Transportation Needs: No Transportation Needs (02/28/2023)   PRAPARE - Administrator, Civil Service (Medical): No    Lack of Transportation (Non-Medical): No  Physical Activity: Not on file  Stress: No Stress Concern Present (02/28/2023)   [employer] of Occupational Health - Occupational Stress Questionnaire    Feeling of Stress : Not at all  Social Connections: Unknown (02/28/2023)   Social Connection and Isolation Panel [NHANES]    Frequency of Communication with Friends and Family: Not on file    Frequency of Social Gatherings with Friends and Family: Not on file    Attends Religious Services: Not on file    Active Member of Clubs or Organizations: Not on file    Attends Banker Meetings: Not on file    Marital Status: Married     Review of Systems  Constitutional:  Negative for appetite change and unexpected weight change.  HENT:  Negative for congestion and sinus pressure.   Respiratory:  Negative for cough, chest tightness and shortness of breath.   Cardiovascular:  Negative for chest pain, palpitations and leg swelling.  Gastrointestinal:  Negative for abdominal pain, nausea and vomiting.  Genitourinary:  Negative for  difficulty urinating and dysuria.  Musculoskeletal:  Positive for back pain. Negative for myalgias.  Skin:  Negative for color change and rash.  Neurological:  Negative for dizziness and headaches.  Psychiatric/Behavioral:  Negative for agitation and dysphoric mood.        Increased stress as outlined.        Objective:     BP 130/74   Pulse 81   Ht 5\' 8"  (1.727 m)   Wt 219 lb 9.6 oz (99.6 kg)   SpO2 98%   BMI 33.39 kg/m  Wt Readings from Last 3 Encounters:  04/13/24 219 lb 9.6 oz (99.6 kg)  03/07/24 224 lb 2 oz (101.7 kg)  03/05/24 226 lb (102.5 kg)  Physical Exam Vitals reviewed. Exam conducted with a chaperone present.  Constitutional:      General: He is not in acute distress.    Appearance: Normal appearance. He is well-developed.  HENT:     Head: Normocephalic and atraumatic.     Right Ear: External ear normal.     Left Ear: External ear normal.     Mouth/Throat:     Pharynx: No oropharyngeal exudate or posterior oropharyngeal erythema.  Eyes:     General: No scleral icterus.       Right eye: No discharge.        Left eye: No discharge.     Conjunctiva/sclera: Conjunctivae normal.  Cardiovascular:     Rate and Rhythm: Normal rate and regular rhythm.  Pulmonary:     Effort: Pulmonary effort is normal. No respiratory distress.     Breath sounds: Normal breath sounds.  Abdominal:     General: Bowel sounds are normal.     Palpations: Abdomen is soft.     Tenderness: There is no abdominal tenderness.  Musculoskeletal:        General: No swelling or tenderness.     Cervical back: Neck supple. No tenderness.  Lymphadenopathy:     Cervical: No cervical adenopathy.  Skin:    Findings: No erythema or rash.  Neurological:     Mental Status: He is alert.  Psychiatric:        Mood and Affect: Mood normal.        Behavior: Behavior normal.         Outpatient Encounter Medications as of 04/13/2024  Medication Sig   allopurinol  (ZYLOPRIM ) 300 MG tablet  TAKE 1 TABLET BY MOUTH EVERY DAY   atorvastatin  (LIPITOR) 40 MG tablet Take 1 tablet (40 mg total) by mouth daily.   bisacodyl (DULCOLAX) 5 MG EC tablet Take 5 mg by mouth daily as needed for moderate constipation.   Continuous Glucose Sensor (FREESTYLE LIBRE 3 PLUS SENSOR) MISC Use to check blood glucose continuously. Change sensor every 15 days.   insulin  aspart (NOVOLOG ) 100 UNIT/ML injection Inject 4 units with your breakfast meal.   insulin  degludec (TRESIBA  FLEXTOUCH) 200 UNIT/ML FlexTouch Pen Inject 24 Units into the skin daily.   Insulin  Pen Needle (PEN NEEDLES) 32G X 4 MM MISC Use to inject medication daily   metaxalone (SKELAXIN) 800 MG tablet Take 800 mg by mouth 3 (three) times daily.   metFORMIN  (GLUCOPHAGE -XR) 500 MG 24 hr tablet Take 1 tablet (500 mg total) by mouth daily with breakfast.   metoprolol  succinate (TOPROL -XL) 50 MG 24 hr tablet TAKE 2 TABLETS BY MOUTH DAILY WITH OR IMMEDIATELY FOLLOWING A MEAL.   oxyCODONE  (ROXICODONE ) 15 MG immediate release tablet Take 15 mg by mouth every 4 (four) hours.   sildenafil  (REVATIO ) 20 MG tablet Take 1-5 tablets 1 hour prior to intercourse as needed   triamterene -hydrochlorothiazide (MAXZIDE-25) 37.5-25 MG tablet TAKE 1 TABLET BY MOUTH EVERY DAY   [DISCONTINUED] doxycycline  (VIBRAMYCIN ) 100 MG capsule Take 1 capsule (100 mg total) by mouth every 12 (twelve) hours. (Patient not taking: Reported on 04/13/2024)   No facility-administered encounter medications on file as of 04/13/2024.     Lab Results  Component Value Date   WBC 7.1 03/12/2024   HGB 12.8 (L) 03/12/2024   HCT 39.8 03/12/2024   PLT 129.0 (L) 03/12/2024   GLUCOSE 298 (H) 03/05/2024   CHOL 122 03/05/2024   TRIG 222.0 (H) 03/05/2024   HDL 42.70 03/05/2024   LDLDIRECT  53.0 09/08/2022   LDLCALC 35 03/05/2024   ALT 22 03/05/2024   AST 24 03/05/2024   NA 132 (L) 03/05/2024   K 4.2 03/05/2024   CL 98 03/05/2024   CREATININE 1.03 03/05/2024   BUN 8 03/05/2024   CO2 25  03/05/2024   TSH 2.87 06/17/2023   PSA 0.41 06/17/2023   INR 1.2 (H) 03/12/2024   HGBA1C 11.0 (H) 03/05/2024   MICROALBUR <0.7 06/17/2023    US  SCROTUM W/DOPPLER Result Date: 03/05/2024 CLINICAL DATA:  Left testicular pain EXAM: SCROTAL ULTRASOUND DOPPLER ULTRASOUND OF THE TESTICLES TECHNIQUE: Complete ultrasound examination of the testicles, epididymis, and other scrotal structures was performed. Color and spectral Doppler ultrasound were also utilized to evaluate blood flow to the testicles. COMPARISON:  None Available. FINDINGS: Right testicle Measurements: 3.4 x 2.0 x 2.8 cm. No focal mass. Microlithiasis present. Left testicle Measurements: 3.5 x 1.8 x 2.3 cm. No focal mass. Microlithiasis is present. Right epididymis:  Normal in size and appearance. Left epididymis:  Normal in size and appearance. Hydrocele: Moderate left hydrocele present. Echogenic focus in the right scrotum measuring 3 x 2 x 2 mm may represent scrotal pleural. Epididymal appendix noted on the left. Within the anterior left hemiscrotum anterior to the Varicocele:  None visualized. Other: Within the anterior left scrotal wall there is an ill-defined heterogeneous area measuring 7 x 4 x 4 mm, indeterminate 3 there is no significant vascularity in this region. Pulsed Doppler interrogation of both testes demonstrates normal low resistance arterial and venous waveforms bilaterally. IMPRESSION: 1. No evidence of testicular torsion. 2. Moderate left hydrocele. 3. Bilateral testicular microlithiasis. 4. Ill-defined heterogeneous area in the anterior left scrotal wall measuring 7 x 4 x 4 mm, indeterminate. Recommend correlation with physical exam. Electronically Signed   By: Tyron Gallon M.D.   On: 03/05/2024 18:49       Assessment & Plan:  Type 2 diabetes mellitus with neurological complications [facility]) Assessment & Plan: Have dscussed diet and exercise.  On tresiba  - 28 units.   Did not tolerate .5mg  ozempic  - increased constipation  issues. Will avoid GLP 1 agonist. Has started back on metformin . Have discussed diet and exercise.  CGM - reviewed sugars as outlined.  Still remaining significantly elevated. On insulin  - due to the degree of elevation of his sugars. Discussed his eating routine. Will start novolog  with his breakfast meal (where sugars start to spike - per review of CGM). Will need to titrate up.  Schedule for a f/u with pharmacy. Continue to record sugars. Adjust insuline as needed. Hopefully will be able to add SGLT 2 inhibitor when sugars better controlled. Discussed need to make sure to take medications as scheduled.   Orders: -     Hemoglobin A1c; Future -     AMB Referral [facility] Care Management  Primary hypertension Assessment & Plan: Blood pressure doing well.  Continue triam/hctz and metoprolol .  Continue to follow pressures. Schedule to check metabolic panel.   Orders: -     Basic metabolic panel with GFR; Future  Hypercholesterolemia Assessment & Plan: Was on lipitor. Low cholesterol diet and exercise.  Follow lipid panel and liver function tests.  Statin stopped while pursuing liver w/up. Discussed today the need for further w/up.  Agreeable for referral.   Orders: -     Hepatic function panel; Future -     Lipid panel; Future  Aortic atherosclerosis (HCC) Assessment & Plan: Off cholesterol medication currently due to underlying liver issues.  Chronic low back pain, unspecified back pain laterality, unspecified whether sciatica present Assessment & Plan: Followed by pain clinic. Discussed.    Cirrhosis of liver without ascites, unspecified hepatic cirrhosis type [facility]) Assessment & Plan: Saw GI (Dr Antony Baumgartner) - 01/2023 - recommended MRI of the liver in 07/05/2023 to screen for [facility].  Have discussed his liver, concerns regarding cirrhosis and poorly controlled sugars and need for f/u scanning and GI evaluation. He has not followed up as previously discussed. Agreeable for f/u after discussion  today.   Orders: -     Ambulatory referral to Gastroenterology  Constipation, unspecified constipation type Assessment & Plan: Takes narcotic pain medication. Have discussed bowel regimen - to keep bowels moving.  Saw GI.  Colonoscopy 03/11/22 - Four 2 to 3 mm polyps in the cecum, removed with a cold biopsy forceps. Resected and retrieved. Two 4 to 6 mm polyps in the cecum, removed with a cold snare. Resected and retrieved. Three 5 to 7 mm polyps in the ascending colon, removed with a cold snare. Resected and retrieved. One 15 mm polyp in the ascending colon, removed with mucosal resection. Resected and retrieved. Clip was placed. The examination was otherwise normal on direct and retroflexion views. Mucosal resection was performed. Resection and retrieval were complete. Continue f/u with GI.  Recommended f/u colonoscopy 6-8 months. Colonoscopy 11/2022 - normal.  Recommended f/u in one year. Has not followed up. Discussed again today the need for f/u with GI.  Arrange. Currently bowels moving better.    Gastroesophageal reflux disease, unspecified whether esophagitis present Assessment & Plan: On protonix .  Follow. No upper symptoms reporte.d    Iron deficiency Assessment & Plan: Dr Antony Baumgartner 01/2023 - recommended - Capsule study of the small bowel in view of iron deficiency. EGD to screen for esophageal varices in April 2026. Oral iron ferrous sulfate  325 mg 1 tablet every other day to be continued.  Colonoscopy 2025. Did not follow up.  Discussed again today the need to follow up.    Thrombocytopenia (HCC) Assessment & Plan: Low platelet count - feel related to his underlying liver issues.  Follow cbc.    Stress Assessment & Plan: Increased stress/frustration related to his pain issues.  Seeing pain clinic.    Liver lesion Assessment & Plan: Saw GI (Dr Antony Baumgartner) - 01/2023 - recommended MRI of the liver in 07/05/2023 to screen for [facility].  Still has not followed up with GI.  Have discussed again  today the need to follow up.   Orders: -     Ambulatory referral to Gastroenterology  Other orders -     Insulin  Aspart; Inject 4 units with your breakfast meal.  Dispense: 10 mL; Refill: 1     Dellar Fenton, MD

## 2024-04-15 ENCOUNTER — Encounter: Payer: Self-pay | Admitting: Internal Medicine

## 2024-04-15 NOTE — Assessment & Plan Note (Signed)
 Followed by pain clinic. Discussed.

## 2024-04-15 NOTE — Assessment & Plan Note (Signed)
 Off cholesterol medication currently due to underlying liver issues.

## 2024-04-15 NOTE — Assessment & Plan Note (Signed)
 Have dscussed diet and exercise.  On tresiba  - 28 units.   Did not tolerate .5mg  ozempic  - increased constipation issues. Will avoid GLP 1 agonist. Has started back on metformin . Have discussed diet and exercise.  CGM - reviewed sugars as outlined.  Still remaining significantly elevated. On insulin  - due to the degree of elevation of his sugars. Discussed his eating routine. Will start novolog  with his breakfast meal (where sugars start to spike - per review of CGM). Will need to titrate up.  Schedule for a f/u with pharmacy. Continue to record sugars. Adjust insuline as needed. Hopefully will be able to add SGLT 2 inhibitor when sugars better controlled. Discussed need to make sure to take medications as scheduled.

## 2024-04-15 NOTE — Assessment & Plan Note (Signed)
 Saw GI (Dr Antony Baumgartner) - 01/2023 - recommended MRI of the liver in 07/05/2023 to screen for [facility].  Have discussed his liver, concerns regarding cirrhosis and poorly controlled sugars and need for f/u scanning and GI evaluation. He has not followed up as previously discussed. Agreeable for f/u after discussion today.

## 2024-04-15 NOTE — Assessment & Plan Note (Signed)
 Was on lipitor. Low cholesterol diet and exercise.  Follow lipid panel and liver function tests.  Statin stopped while pursuing liver w/up. Discussed today the need for further w/up.  Agreeable for referral.

## 2024-04-15 NOTE — Assessment & Plan Note (Signed)
 Dr Antony Baumgartner 01/2023 - recommended - Capsule study of the small bowel in view of iron deficiency. EGD to screen for esophageal varices in April 2026. Oral iron ferrous sulfate  325 mg 1 tablet every other day to be continued.  Colonoscopy 2025. Did not follow up.  Discussed again today the need to follow up.

## 2024-04-15 NOTE — Assessment & Plan Note (Signed)
 Takes narcotic pain medication. Have discussed bowel regimen - to keep bowels moving.  Saw GI.  Colonoscopy 03/11/22 - Four 2 to 3 mm polyps in the cecum, removed with a cold biopsy forceps. Resected and retrieved. Two 4 to 6 mm polyps in the cecum, removed with a cold snare. Resected and retrieved. Three 5 to 7 mm polyps in the ascending colon, removed with a cold snare. Resected and retrieved. One 15 mm polyp in the ascending colon, removed with mucosal resection. Resected and retrieved. Clip was placed. The examination was otherwise normal on direct and retroflexion views. Mucosal resection was performed. Resection and retrieval were complete. Continue f/u with GI.  Recommended f/u colonoscopy 6-8 months. Colonoscopy 11/2022 - normal.  Recommended f/u in one year. Has not followed up. Discussed again today the need for f/u with GI.  Arrange. Currently bowels moving better.

## 2024-04-15 NOTE — Assessment & Plan Note (Signed)
 Saw GI (Dr Antony Baumgartner) - 01/2023 - recommended MRI of the liver in 07/05/2023 to screen for [facility].  Still has not followed up with GI.  Have discussed again today the need to follow up.

## 2024-04-15 NOTE — Assessment & Plan Note (Signed)
 On protonix .  Follow. No upper symptoms reporte.d

## 2024-04-15 NOTE — Assessment & Plan Note (Signed)
Low platelet count - feel related to his underlying liver issues.  Follow cbc.   

## 2024-04-15 NOTE — Assessment & Plan Note (Signed)
 Blood pressure doing well.  Continue triam/hctz and metoprolol .  Continue to follow pressures. Schedule to check metabolic panel.

## 2024-04-15 NOTE — Assessment & Plan Note (Signed)
 Increased stress/frustration related to his pain issues.  Seeing pain clinic.

## 2024-04-17 ENCOUNTER — Other Ambulatory Visit: Payer: Self-pay

## 2024-04-17 ENCOUNTER — Telehealth: Payer: Self-pay

## 2024-04-17 NOTE — Telephone Encounter (Signed)
 Ok to prescribe the pen.

## 2024-04-17 NOTE — Telephone Encounter (Signed)
 Patient calling in to let me know that his sensor is not working again. He knocked one off working on a car and then the other one is reading an error message. Advised patient that he will need to contact [employer] to get a replacement sensor. Also, his novolog  that was sent in 5/30 he says was filled as a vial and he is wondering if we can prescribe the pen?

## 2024-04-17 NOTE — Telephone Encounter (Signed)
 Copied from CRM [id number]. Topic: Clinical - Medical Advice >> Apr 17, 2024 12:40 PM Aisha D wrote: Reason for CRM: Pt is requesting to speak with a nurse regarding how to use his insulin . Pt stated that his glucose monitor is not working and also needs assistance with that as well. Pt would like to receive a call back today.

## 2024-04-18 ENCOUNTER — Telehealth: Payer: Self-pay

## 2024-04-18 DIAGNOSIS — F1199 Opioid use, unspecified with unspecified opioid-induced disorder: Secondary | ICD-10-CM | POA: Diagnosis not present

## 2024-04-18 MED ORDER — NOVOLOG FLEXPEN 100 UNIT/ML ~~LOC~~ SOPN
4.0000 [IU] | PEN_INJECTOR | Freq: Every day | SUBCUTANEOUS | 11 refills | Status: AC
Start: 2024-04-18 — End: ?

## 2024-04-18 NOTE — Addendum Note (Signed)
 Addended by: Victorino Grates D on: 04/18/2024 01:05 PM   Modules accepted: Orders

## 2024-04-18 NOTE — Progress Notes (Signed)
 Complex Care Management Note  Care Guide Note 04/18/2024 Name: Cameron Sanchez MRN: [id number] DOB: 28-May-1971  [name] is a 53 y.o. year old male who sees Dellar Fenton, MD for primary care. I reached out to [name] by phone today to offer complex care management services.  [name] was given information about Complex Care Management services today including:   The Complex Care Management services include support from the care team which includes your Nurse Care Manager, Clinical Social Worker, or Pharmacist.  The Complex Care Management team is here to help remove barriers to the health concerns and goals most important to you. Complex Care Management services are voluntary, and the patient may decline or stop services at any time by request to their care team member.   Complex Care Management Consent Status: Patient agreed to services and verbal consent obtained.   Follow up plan:  Telephone appointment with complex care management team member scheduled for:  04/18/24 at 11:00 a.m.   Encounter Outcome:  Patient Scheduled  [facility]  [facility], [facility] Health Care Management Assistant Direct Dial: [phone]  Fax: [phone]

## 2024-04-18 NOTE — Telephone Encounter (Signed)
 Flex pen sent in.

## 2024-04-20 ENCOUNTER — Other Ambulatory Visit

## 2024-04-20 NOTE — Progress Notes (Deleted)
 04/20/2024 Name: Cameron Sanchez MRN: [id number] DOB: [birth year]  Subjective  No chief complaint on file.   Reason for visit: ?  Cameron Sanchez is a 53 y.o. male with a history of diabetes (type 2), who presents today for an initial diabetes pharmacotherapy visit.? Pertinent PMH also includes HTN, HLD, sleep apnea, obesity, cirrhosis of the liver, Hx TIA.  Known DM Complications: {Blank multiple:19197::"retinopathy","peripheral neuropathy","nephropathy","hypoglycemia unawareness","severe hypoglycemia","DKA","autonomic neuropathy","gastroparesis","amputation","significant fear of hypoglycemia","no known complications"}   Care Team: Primary Care Provider: Dellar Fenton, MD   Date of Last Diabetes Related Visit: with PCP on 04/15/24  Recent Summary of Change: 04/15/24: ***  Medication Access/Adherence: Prescription drug coverage: Payor: AETNA MEDICARE / Plan: AETNA MEDICARE HMO/PPO / Product Type: *No Product type* / .  - Reports that all medications are *** affordable.  - Current Patient Assistance: None***  Since Last visit / History of Present Illness: ?  Patient reports implementing plan from last visit. Denies concerns with addition of new insulin , Novolog .  Historically, apprehensive to take metformin  due to colleagues stories of how metformin  caused them leg pain and reduced sexual drive. In the past, life stressors and pain management have been large drivers of medication non-compliance.   Reported DM Regimen: ?  Tresiba  U100 28 units daily Metformin  XR 500 mg once daily (breakfast) *** Novolog  4*** units before breakfast    DM medications tried in the past:?  Ozempic  (worsening of baseline constipation at 0.5 mg dose) *** (***) *** (***)  SMBG: Libre 3 (connected to [employer]) ?  No uploads since ***   Hypo/Hyperglycemia: ?  Symptoms of hypoglycemia since last visit:? {Blank multiple:19197::"yes","no","n/a","***"}  Symptoms of hyperglycemia since last visit:? {Blank  multiple:19197::"yes","no","n/a","***"} - {symptoms; hyperglycemia:17903}  Reported Diet: Patient typically eats *** meals per day.  Breakfast (9-10:30 am): *** Lunch: *** Dinner (5-6 pm): *** Snacks: *** Beverages: ***  Exercise: ***  DM Prevention:  Statin: Taking; high intensity.?  History of chronic kidney disease? no History of albuminuria? no, last UACR on 06/17/23 = ~6 mg/g ACE/ARB - Not taking; Urine MA/CR Ratio - <30 mg/g.  Last eye exam: 06/16/23; No retinopathy present Last foot exam: 01/02/2024 Tobacco Use: Never smoker   Cardiovascular Risk Reduction History of clinical ASCVD? yes. Hx TIA. Aortic atherosclerosis  History of heart failure? no History of hyperlipidemia? yes Current BMI: *** kg/m2 (Ht 68 in, Wt 99.6 kg) Taking statin? yes; high intensity (rosuvastatin 40mg ) Taking aspirin ? {Blank single:19197::"indicated (primary prevention)","indicated (secondary prevention)","not indicated","unclear if indicated"}; Not taking   Taking SGLT-2i? no Taking GLP- 1 RA? no      _______________________________________________  Objective      Physical Examination:  Vitals:  Wt Readings from Last 3 Encounters:  04/13/24 219 lb 9.6 oz (99.6 kg)  03/07/24 224 lb 2 oz (101.7 kg)  03/05/24 226 lb (102.5 kg)   BP Readings from Last 3 Encounters:  04/13/24 130/74  03/07/24 127/76  03/05/24 130/70   Pulse Readings from Last 3 Encounters:  04/13/24 81  03/07/24 74  03/05/24 74     Labs:?  Lab Results  Component Value Date   HGBA1C 11.0 (H) 03/05/2024   HGBA1C 10.0 (H) 10/18/2023   HGBA1C 8.8 (H) 06/17/2023   GLUCOSE 298 (H) 03/05/2024   MICRALBCREAT 0.6 06/17/2023   MICRALBCREAT 0.9 06/10/2022   MICRALBCREAT 1.2 04/28/2022   CREATININE 1.03 03/05/2024   CREATININE 1.00 10/18/2023   CREATININE 0.92 06/17/2023   GFR 82.17 03/05/2024   GFR 85.36 10/18/2023   GFR 94.57 06/17/2023  Lab Results  Component Value Date   CHOL 122 03/05/2024   LDLCALC 35  03/05/2024   LDLCALC 89 10/18/2023   LDLCALC 46 06/17/2023   LDLDIRECT 53.0 09/08/2022   LDLDIRECT 142.0 06/21/2019   HDL 42.70 03/05/2024   TRIG 222.0 (H) 03/05/2024   TRIG 127.0 10/18/2023   TRIG 195.0 (H) 06/17/2023   ALT 22 03/05/2024   ALT 25 10/18/2023   AST 24 03/05/2024   AST 24 10/18/2023      Chemistry      Component Value Date/Time   NA 132 (L) 03/05/2024 1338   NA 137 02/03/2023 1450   NA 137 09/13/2012 2026   K 4.2 03/05/2024 1338   K 3.7 09/13/2012 2026   CL 98 03/05/2024 1338   CL 100 09/13/2012 2026   CO2 25 03/05/2024 1338   CO2 27 09/13/2012 2026   BUN 8 03/05/2024 1338   BUN 10 02/03/2023 1450   BUN 10 09/13/2012 2026   CREATININE 1.03 03/05/2024 1338   CREATININE 1.16 02/18/2017 1547      Component Value Date/Time   CALCIUM  9.2 03/05/2024 1338   CALCIUM  9.7 09/13/2012 2026   ALKPHOS 66 03/05/2024 1338   ALKPHOS 63 09/13/2012 2026   AST 24 03/05/2024 1338   AST 27 09/13/2012 2026   ALT 22 03/05/2024 1338   ALT 71 09/13/2012 2026   BILITOT 0.6 03/05/2024 1338   BILITOT 0.6 02/03/2023 1450   BILITOT 1.5 (H) 09/13/2012 2026     The ASCVD Risk score (Arnett DK, et al., 2019) failed to calculate for the following reasons:   The valid total cholesterol range is 130 to 320 mg/dL  Assessment and Plan:   1. Diabetes, type 2: uncontrolled per last A1c of ***% (***), {Increased/decreased/na:15831} from previous ***% (***) with goal <7% without hypoglycemia.  {lzcgmsmbg:31042} data shows ***?. Current Regimen: *** Diet: *** Exercise: ***  HCM: {LZ HCM DM2:31604} Continue medications today without changes.  ***  Reviewed s/sx/tx hypoglycemia Next A1c due *** Future Consideration: GLP1-RA: *** SGLT2i: *** Metformin : {LZ AP metformin :31605}  SU: Not unreasonable, though defer given alternative options with lower risk of hypoglycemia.  TZD: Avoiding due to possible weight gain/increase in fracture risk. ***HF Insulin : Not unreasonable, though  defer as last resort or as needed for severe hyperglycemia given risk hypoglycemia. Other safer options at this time.    Follow Up Follow up with clinical pharmacist via *** in *** weeks Patient given direct line for questions regarding medication therapy and MAP applications***  Future Appointments  Date Time Provider Department Center  04/20/2024 11:00 AM [facility] PHARMACIST [facility]  06/11/2024  9:15 AM [facility]  06/13/2024  8:00 AM Dellar Fenton, MD [facility]    Daron Ellen, PharmD Clinical Pharmacist [facility] [phone]       Assessment & Plan   Diabetes: uncontrolled with A1c 10.0%, increased from 8.8% (06/17/23) secondary to medication non-compliance and diet. Never restarted Ozempic  or metformin . Not wearing CGM or checking sugars. Reports priority has been pain management over other medications the past few months.  Reported regimen: Tresiba  28 units daily, metformin  XR 500 mg (started within the past week, not taking 2 tablets as instructed by PCP) Increase metformin  XR 500?1000 mg daily Emphasized importance of BG monitoring in the setting of medication adjustment. Patient agreeable to getting sensors from the pharmacy within the next week.  Reviewed goal A1c, goal fasting, and goal 2 hour post prandial glucose  Future considerations: -  Optimize metformin  XR dose to 2000 mg daily (though patient extremely hesitant to use metformin ) - Ozempic  (patient has declined previously d/t c/f constipation. Long discussions had regarding chronic opioid use and need for scheduled bowel regimen of stool softener AND stimulant).  - SGLT2i is reasonable though consider initiation once glycemic control is slightly improved (volume depletion w severe hyperglycemia may exacerbate dehydration/constipation issue)  Follow Up Plan:  Follow up with PharmD in 1-2 week via telephone check in to review CGM and continue to titrate insulin  as  needed.   Future Appointments  Date Time Provider Department Center  04/20/2024 11:00 AM [facility] PHARMACIST [facility]  06/11/2024  9:15 AM [facility]  06/13/2024  8:00 AM Dellar Fenton, MD [facility]   Daron Ellen, PharmD Clinical Pharmacist [facility] [phone]

## 2024-04-25 DIAGNOSIS — F1199 Opioid use, unspecified with unspecified opioid-induced disorder: Secondary | ICD-10-CM | POA: Diagnosis not present

## 2024-04-27 ENCOUNTER — Telehealth: Payer: Self-pay

## 2024-04-27 NOTE — Progress Notes (Signed)
 Complex Care Management Note  Care Guide Note 04/27/2024 Name: DEQUANE STRAHAN MRN: [id number] DOB: 1971/02/11  BRAYDN CARNEIRO is a 53 y.o. year old male who sees Dellar Fenton, MD for primary care. I reached out to [name] by phone today to offer complex care management services.  Mr. Welte was given information about Complex Care Management services today including:   The Complex Care Management services include support from the care team which includes your Nurse Care Manager, Clinical Social Worker, or Pharmacist.  The Complex Care Management team is here to help remove barriers to the health concerns and goals most important to you. Complex Care Management services are voluntary, and the patient may decline or stop services at any time by request to their care team member.   Complex Care Management Consent Status: Patient agreed to services and verbal consent obtained.   Follow up plan:  Telephone appointment with complex care management team member scheduled for:  05/01/24 at 2:00 p.m.  Encounter Outcome:  Patient Scheduled  [facility]  [facility], [facility] Care Management Assistant Direct Dial: [phone]  Fax: [phone]

## 2024-05-01 ENCOUNTER — Other Ambulatory Visit (INDEPENDENT_AMBULATORY_CARE_PROVIDER_SITE_OTHER): Admitting: Pharmacist

## 2024-05-01 DIAGNOSIS — E1149 Type 2 diabetes mellitus with other diabetic neurological complication: Secondary | ICD-10-CM

## 2024-05-01 MED ORDER — NOVOLOG FLEXPEN 100 UNIT/ML ~~LOC~~ SOPN: PEN_INJECTOR | SUBCUTANEOUS | 0 refills | Status: DC

## 2024-05-01 NOTE — Patient Instructions (Addendum)
 [name],   It was a pleasure to speak with you today! As we discussed:?   Continue Tresiba  28 units once daily Start Novolog  at 4 units before your breakfast meal each day. This insulin  is only to be used prior to a meal (if you skip breakfast, you would not give the Novolog ).   We will work on adding the Novolog  insulin  to your Novo [employer] application (this is the company that had sent you the Tresiba  insulin  for free).  In the meantime, the Novolog  should theoretically be cheaper ~$35 for 1 month at your pharmacy.    Please reach out prior to your next scheduled appointment should you have any questions or concerns.  Thank you!   Future Appointments  Date Time Provider Department Center  06/11/2024  9:15 AM [facility]  06/13/2024  8:00 AM Dellar Fenton, MD [facility]    Daron Ellen, PharmD Clinical Pharmacist [facility] [phone]

## 2024-05-01 NOTE — Progress Notes (Signed)
 05/01/2024 Name: Cameron Sanchez MRN: [id number] DOB: [birth year]  Subjective  Chief Complaint  Patient presents with   Diabetes   Reason for visit: ?  STERLIN Sanchez is a 53 y.o. male with a history of diabetes (type 2), who presents today for a follow up diabetes pharmacotherapy visit.? Pertinent PMH also includes HTN, HLD, sleep apnea, obesity, cirrhosis of the liver, Hx TIA.  Known DM Complications: cirrhosis (possible contribution from FLD?)   Care Team: Primary Care Provider: Dellar Fenton, MD   Recent Summary of Change: 5/30: Start Novolog  injection. 4 units sq once daily AC breakfast   Medication Access/Adherence: Prescription drug coverage: Payor: AETNA MEDICARE / Plan: AETNA MEDICARE HMO/PPO / Product Type: *No Product type* / .  - Reports that all medications are not affordable. Novolog  was >$100 so he has not started taking yet.  - Current Patient Assistance: None - Patient lives in a household of 2 with his wife. Reports combined income <<400% [employer].   Since Last visit / History of Present Illness: ?  Patient confirms use of Tresiba  28 units daily though has not started Novolog  2/2 high copay. Is enrolled in [employer] PAP for Tresiba .   Also feels that metformin  causes his legs to ache so he has been taking every other day, or skipping doses.   Historically, apprehensive to take metformin  due to colleagues stories of how metformin  caused them leg pain and reduced sexual drive. In the past, life stressors and pain management have been large drivers of medication non-compliance.   Reported DM Regimen: ?  Tresiba  U200 28 units daily Metformin  XR 500 mg once daily (breakfast) - taking ~every other day or less due to perceived leg pain on metformin .   - Has not started   DM medications tried in the past:?  Ozempic  (worsening of baseline constipation at 0.5 mg dose) - on chronic opioid w/o bowel regimen   SMBG: Libre 3 (connected to [employer]) ?  Reports issue connecting his  most recent sensor. Has not called [employer] for replacement though confirms he has the number. Does not have his unopened sensor available at the time of visit.   Hypo/Hyperglycemia: ?  Symptoms of hypoglycemia since last visit:? no  Symptoms of hyperglycemia since last visit:? yes - excessive thirst  Reported Diet:  Recent Progress/Goals: Has cut out sweet tea altogether (now doing unsweet + Stevia)  DM Prevention:  Statin: Taking; high intensity.?  History of chronic kidney disease? no History of albuminuria? no, last UACR on 06/17/23 = ~6 mg/g ACE/ARB - Not taking; Urine MA/CR Ratio - <30 mg/g.  Last eye exam: 06/16/23; No retinopathy present Last foot exam: 01/02/2024 Tobacco Use: Never smoker   Cardiovascular Risk Reduction History of clinical ASCVD? yes. Hx TIA. Aortic atherosclerosis  History of heart failure? no History of hyperlipidemia? yes Current BMI: 33.3 kg/m2 (Ht 68 in, Wt 99.6 kg) Taking statin? yes; high intensity (rosuvastatin 40mg ) Taking aspirin ? indicated (secondary prevention); Not taking   Taking SGLT-2i? no Taking GLP- 1 RA? no      _______________________________________________  Objective      Physical Examination:  Vitals:  Wt Readings from Last 3 Encounters:  04/13/24 219 lb 9.6 oz (99.6 kg)  03/07/24 224 lb 2 oz (101.7 kg)  03/05/24 226 lb (102.5 kg)   BP Readings from Last 3 Encounters:  04/13/24 130/74  03/07/24 127/76  03/05/24 130/70   Pulse Readings from Last 3 Encounters:  04/13/24 81  03/07/24 74  03/05/24 74  Labs:?  Lab Results  Component Value Date   HGBA1C 11.0 (H) 03/05/2024   HGBA1C 10.0 (H) 10/18/2023   HGBA1C 8.8 (H) 06/17/2023   GLUCOSE 298 (H) 03/05/2024   MICRALBCREAT 0.6 06/17/2023   MICRALBCREAT 0.9 06/10/2022   MICRALBCREAT 1.2 04/28/2022   CREATININE 1.03 03/05/2024   CREATININE 1.00 10/18/2023   CREATININE 0.92 06/17/2023   GFR 82.17 03/05/2024   GFR 85.36 10/18/2023   GFR 94.57 06/17/2023   Lab Results   Component Value Date   CHOL 122 03/05/2024   LDLCALC 35 03/05/2024   LDLCALC 89 10/18/2023   LDLCALC 46 06/17/2023   LDLDIRECT 53.0 09/08/2022   LDLDIRECT 142.0 06/21/2019   HDL 42.70 03/05/2024   TRIG 222.0 (H) 03/05/2024   TRIG 127.0 10/18/2023   TRIG 195.0 (H) 06/17/2023   ALT 22 03/05/2024   ALT 25 10/18/2023   AST 24 03/05/2024   AST 24 10/18/2023     Chemistry      Component Value Date/Time   NA 132 (L) 03/05/2024 1338   NA 137 02/03/2023 1450   NA 137 09/13/2012 2026   K 4.2 03/05/2024 1338   K 3.7 09/13/2012 2026   CL 98 03/05/2024 1338   CL 100 09/13/2012 2026   CO2 25 03/05/2024 1338   CO2 27 09/13/2012 2026   BUN 8 03/05/2024 1338   BUN 10 02/03/2023 1450   BUN 10 09/13/2012 2026   CREATININE 1.03 03/05/2024 1338   CREATININE 1.16 02/18/2017 1547      Component Value Date/Time   CALCIUM  9.2 03/05/2024 1338   CALCIUM  9.7 09/13/2012 2026   ALKPHOS 66 03/05/2024 1338   ALKPHOS 63 09/13/2012 2026   AST 24 03/05/2024 1338   AST 27 09/13/2012 2026   ALT 22 03/05/2024 1338   ALT 71 09/13/2012 2026   BILITOT 0.6 03/05/2024 1338   BILITOT 0.6 02/03/2023 1450   BILITOT 1.5 (H) 09/13/2012 2026     The ASCVD Risk score (Arnett DK, et al., 2019) failed to calculate for the following reasons:   The valid total cholesterol range is 130 to 320 mg/dL  Assessment and Plan:   1. Diabetes, type 2: uncontrolled per last A1c of 11% (4/21), increased from previous 10% (12/3) with goal <7% without hypoglycemia. Not currently checking sugars. Has not started Novolog  insulin . Will work on medication access and [place] troubleshooting then follow closely for insulin  titration once access barriers are resolved.  Current Regimen: Tresiba  U200 28 units/d, Metfomrin Xr 500 mg/d (not taking / taking every other day) Diet: Has cut out sweet tea as previously recommended. Has bene doing unsweet + Stevia  Continue medications Start Novolog  4 units AC breakfast   30 day-supply should  be $35. However, as written they are likely billing 90 ds for $110 Novo Refill Request form initiated for addition of Novolog  to current enrollment - sent to PCP for review/signature Will attempt follow up later this week for [facility] troubleshooting once he has his sensor with him. Offered clinic visit or phone visit. Patient plans to call [facility] for replacement sensor.   Future Consideration: SGLT2i: Ideal once A1c closer to euglycemia as to avoid volume depletion in the setting of significant glucosuria.  GLP1-RA: Feels he did not tolerate due to baseline constipation on chronic opioids. Trulicity reasonable in the future if patient amenable. Less effective than Ozempic  though possible better tolerated. Again, he would benefit from bowel regimen given chronic opioid use.  DPP4i: Reasonable if does not want to try  Trulicity. May be eligible for PAP.  Metformin : Titration is ideal in the setting of good renal function, eGFR >60.  XR typically well-tolerated. Patient preference not to take metformin .  SU: Not unreasonable, though defer given alternative options with lower risk of hypoglycemia.  TZD: Avoiding due to possible weight gain/increase in fracture risk.  Insulin : Not unreasonable, though defer as last resort or as needed for severe hyperglycemia given risk hypoglycemia. Other safer options at this time.    Follow Up PCP fu as scheduled ~1 month  Patient given direct line for questions regarding medication therapy and MAP applications PharmD phone follow up ~1 week to troubleshoot [facility] sensor issues   Future Appointments  Date Time Provider Department Center  06/11/2024  9:15 AM [facility]  06/13/2024  8:00 AM Cameron Fenton, MD [facility]   Daron Ellen, PharmD Clinical Pharmacist [facility] [phone]

## 2024-05-02 DIAGNOSIS — F1199 Opioid use, unspecified with unspecified opioid-induced disorder: Secondary | ICD-10-CM | POA: Diagnosis not present

## 2024-05-10 DIAGNOSIS — F1199 Opioid use, unspecified with unspecified opioid-induced disorder: Secondary | ICD-10-CM | POA: Diagnosis not present

## 2024-05-11 ENCOUNTER — Other Ambulatory Visit: Payer: Self-pay

## 2024-05-11 ENCOUNTER — Emergency Department
Admission: EM | Admit: 2024-05-11 | Discharge: 2024-05-11 | Disposition: A | Discharge status: Against Medical Advice | Attending: Emergency Medicine | Admitting: Emergency Medicine

## 2024-05-11 ENCOUNTER — Encounter: Payer: Self-pay | Admitting: Emergency Medicine

## 2024-05-11 DIAGNOSIS — Z760 Encounter for issue of repeat prescription: Secondary | ICD-10-CM | POA: Insufficient documentation

## 2024-05-11 DIAGNOSIS — M545 Low back pain, unspecified: Secondary | ICD-10-CM | POA: Diagnosis not present

## 2024-05-11 DIAGNOSIS — M5137 Other intervertebral disc degeneration, lumbosacral region with discogenic back pain only: Secondary | ICD-10-CM | POA: Diagnosis not present

## 2024-05-11 DIAGNOSIS — Z79891 Long term (current) use of opiate analgesic: Secondary | ICD-10-CM | POA: Diagnosis not present

## 2024-05-11 DIAGNOSIS — G894 Chronic pain syndrome: Secondary | ICD-10-CM | POA: Diagnosis not present

## 2024-05-11 DIAGNOSIS — Z5321 Procedure and treatment not carried out due to patient leaving prior to being seen by health care provider: Secondary | ICD-10-CM | POA: Insufficient documentation

## 2024-05-11 DIAGNOSIS — M961 Postlaminectomy syndrome, not elsewhere classified: Secondary | ICD-10-CM | POA: Diagnosis not present

## 2024-05-11 NOTE — ED Triage Notes (Signed)
 Pt reports having pain management appointment this morning and being prescribed his regular prescription which he gets monthly. Pt reports going to pharmacy but prescription was not available. Pt went back to pain clinic to clairfy, his provider told pt he would resend prescription, but prescription was never sent. Pt is seeking refill to cover him for the weekend until he can go back to pain clinic. Pt reports taking oxycodone  15 mg q4h.

## 2024-05-15 ENCOUNTER — Ambulatory Visit

## 2024-05-15 DIAGNOSIS — K7460 Unspecified cirrhosis of liver: Secondary | ICD-10-CM | POA: Diagnosis not present

## 2024-05-15 DIAGNOSIS — K5909 Other constipation: Secondary | ICD-10-CM | POA: Diagnosis not present

## 2024-05-15 DIAGNOSIS — F1199 Opioid use, unspecified with unspecified opioid-induced disorder: Secondary | ICD-10-CM | POA: Diagnosis not present

## 2024-05-17 ENCOUNTER — Other Ambulatory Visit: Payer: Self-pay | Admitting: Gastroenterology

## 2024-05-17 DIAGNOSIS — K7460 Unspecified cirrhosis of liver: Secondary | ICD-10-CM

## 2024-05-22 ENCOUNTER — Ambulatory Visit
Admission: RE | Admit: 2024-05-22 | Discharge: 2024-05-22 | Disposition: A | Discharge status: Home / Self Care | Source: Ambulatory Visit | Attending: Gastroenterology | Admitting: Gastroenterology

## 2024-05-22 DIAGNOSIS — K7460 Unspecified cirrhosis of liver: Secondary | ICD-10-CM | POA: Insufficient documentation

## 2024-05-22 DIAGNOSIS — Z9049 Acquired absence of other specified parts of digestive tract: Secondary | ICD-10-CM | POA: Diagnosis not present

## 2024-05-22 DIAGNOSIS — F1199 Opioid use, unspecified with unspecified opioid-induced disorder: Secondary | ICD-10-CM | POA: Diagnosis not present

## 2024-05-22 DIAGNOSIS — K7689 Other specified diseases of liver: Secondary | ICD-10-CM | POA: Diagnosis not present

## 2024-05-25 ENCOUNTER — Telehealth: Payer: Self-pay

## 2024-05-25 NOTE — Telephone Encounter (Signed)
 Patient assistance medication available for pick up. LM for patient to let him know.     Medication Tresiba  Qty: 2boxes

## 2024-05-28 ENCOUNTER — Telehealth: Payer: Self-pay

## 2024-05-28 NOTE — Telephone Encounter (Signed)
 Spoke to Patient earlier regarding Dr. Yates recommendations about the abdominal sore from the insulin . Patient asked about patient assistance medications so I let him know I could check and call him back. I checked and called the Patient to let him know he has 2 bags of needles and Tresiba  of patient assistance medications here and he said he thought Dr. Glendia took him off of the Tresiba  due to stomach issues with it. Tresiba  is still on his medication list and the last note I saw from 05/01/24 states he confirmed that he still takes it. Do you know if this has changed since 05/01/24?

## 2024-05-28 NOTE — Telephone Encounter (Signed)
 Copied from CRM [id number]. Topic: Clinical - Medical Advice >> May 28, 2024  1:09 PM Cameron Sanchez wrote: Reason for CRM: Patient was treated for an open sore on his stomach where he injects his diabetes, the wound is starting to reopen and an appointment was scheduled for 06/01/2024 with Leron Glance; however, her would like to know if Dr.Scott would like for him to wait or be seen sooner.

## 2024-05-28 NOTE — Telephone Encounter (Signed)
 Spoke to Patient with Dr. Yates recommendations and the Patient is agreeable to going to UC if he develops any worsening symptoms other wise he can be seen on 06/01/24 at 9:00.

## 2024-05-28 NOTE — Telephone Encounter (Signed)
 Called Patient with Dr. Yates recommendation about the sore from giving himself the insulin  the Patient asked what patient assistance medications was here for him. I let the Patient know I would check and call him back. I checked and called Patient to let him know there is 2 bags full of needles and Tresiba  here for him. Patient would like to know if Dr. Glendia took him off of the Tresiba  but I did not see that looking through his chart and the Tresiba  is still on the medication list.

## 2024-05-28 NOTE — Telephone Encounter (Signed)
 Noted

## 2024-05-29 DIAGNOSIS — F1199 Opioid use, unspecified with unspecified opioid-induced disorder: Secondary | ICD-10-CM | POA: Diagnosis not present

## 2024-05-30 ENCOUNTER — Other Ambulatory Visit: Payer: Self-pay | Admitting: Internal Medicine

## 2024-05-30 DIAGNOSIS — I700 Atherosclerosis of aorta: Secondary | ICD-10-CM

## 2024-05-31 NOTE — Telephone Encounter (Signed)
 Patient is going to see [facility] tomorrow for sore on his stomach. Advised that we stopped his ozempic  due to stomach issues and he was to continue his tresiba . He is going pick up shipments tomorrow while he is here. He also asked if someone could place his new libre sensor while he is here.

## 2024-06-01 ENCOUNTER — Ambulatory Visit (INDEPENDENT_AMBULATORY_CARE_PROVIDER_SITE_OTHER): Admitting: Nurse Practitioner

## 2024-06-01 VITALS — BP 116/66 | HR 69 | Temp 98.4°F | Ht 68.0 in | Wt 220.6 lb

## 2024-06-01 DIAGNOSIS — Z794 Long term (current) use of insulin: Secondary | ICD-10-CM | POA: Diagnosis not present

## 2024-06-01 DIAGNOSIS — L989 Disorder of the skin and subcutaneous tissue, unspecified: Secondary | ICD-10-CM

## 2024-06-01 DIAGNOSIS — E1149 Type 2 diabetes mellitus with other diabetic neurological complication: Secondary | ICD-10-CM

## 2024-06-01 MED ORDER — MUPIROCIN 2 % EX OINT
1.0000 | TOPICAL_OINTMENT | Freq: Two times a day (BID) | CUTANEOUS | 0 refills | Status: AC
Start: 2024-06-01 — End: ?

## 2024-06-01 MED ORDER — CEPHALEXIN 500 MG PO CAPS: 500.0000 mg | ORAL_CAPSULE | Freq: Four times a day (QID) | ORAL | 0 refills | Status: DC

## 2024-06-01 NOTE — Progress Notes (Signed)
 Cameron Glance, NP-C Phone: [phone]  Cameron Sanchez is a 53 y.o. male who presents today for abdominal lesion.   Discussed the use of AI scribe software for clinical note transcription with the patient, who gave verbal consent to proceed.  History of Present Illness   Cameron Sanchez is a 53 year old male with diabetes who presents with a chronic wound on his abdomen.  He has had a wound on his abdomen for about a year, initially noticed by his pain management doctor. Despite multiple courses of antibiotics, including oral doxycycline  and cephalosporins, and topical treatments like mupirocin , the wound has not fully healed. It has temporarily healed but then reopened. The wound is located where he usually administers his injections, and he alternates sites. Recently, it has become painful and occasionally bleeds, with spots of blood noticed on the patches he wears. He avoids using Band-Aids due to irritation and instead uses an old sock to cover the area.  He is diabetic and is concerned about the wound's prolonged presence and its potential impact on his health. His blood sugar levels have been high, with a recent A1c of 11. He uses Tresiba  insulin  and has a sensor for monitoring his blood sugar, although he forgot to bring it to the appointment. No fevers or chills.      Social History   Tobacco Use  Smoking Status Never  Smokeless Tobacco Never    Current Outpatient Medications on File Prior to Visit  Medication Sig Dispense Refill   allopurinol  (ZYLOPRIM ) 300 MG tablet TAKE 1 TABLET BY MOUTH EVERY DAY 90 tablet 1   atorvastatin  (LIPITOR) 40 MG tablet TAKE 1 TABLET BY MOUTH EVERY DAY 90 tablet 3   bisacodyl (DULCOLAX) 5 MG EC tablet Take 5 mg by mouth daily as needed for moderate constipation.     Continuous Glucose Sensor (FREESTYLE LIBRE 3 PLUS SENSOR) MISC Use to check blood glucose continuously. Change sensor every 15 days. 2 each 5   insulin  degludec (TRESIBA  FLEXTOUCH) 200  UNIT/ML FlexTouch Pen Inject 24 Units into the skin daily. 15 mL 2   Insulin  Pen Needle (PEN NEEDLES) 32G X 4 MM MISC Use to inject medication daily 100 each 2   metaxalone (SKELAXIN) 800 MG tablet Take 800 mg by mouth 3 (three) times daily.     oxyCODONE  (ROXICODONE ) 15 MG immediate release tablet Take 15 mg by mouth every 4 (four) hours.     sildenafil  (REVATIO ) 20 MG tablet Take 1-5 tablets 1 hour prior to intercourse as needed 30 tablet 11   triamterene -hydrochlorothiazide (MAXZIDE-25) 37.5-25 MG tablet TAKE 1 TABLET BY MOUTH EVERY DAY 90 tablet 1   No current facility-administered medications on file prior to visit.     ROS see history of present illness  Objective  Physical Exam Vitals:   06/01/24 0920  BP: 116/66  Pulse: 69  Temp: 98.4 F (36.9 C)  SpO2: 98%    BP Readings from Last 3 Encounters:  06/13/24 120/70  06/01/24 116/66  04/13/24 130/74   Wt Readings from Last 3 Encounters:  06/13/24 225 lb (102.1 kg)  06/01/24 220 lb 9.6 oz (100.1 kg)  04/13/24 219 lb 9.6 oz (99.6 kg)    Physical Exam Constitutional:      General: He is not in acute distress.    Appearance: Normal appearance.  HENT:     Head: Normocephalic.  Cardiovascular:     Rate and Rhythm: Normal rate and regular rhythm.     Heart  sounds: Normal heart sounds.  Pulmonary:     Effort: Pulmonary effort is normal.     Breath sounds: Normal breath sounds.  Skin:    General: Skin is warm and dry.     Findings: Lesion (LLQ) present.     Comments: No drainage. Mild tenderness. See pic below  Neurological:     General: No focal deficit present.     Mental Status: He is alert.  Psychiatric:        Mood and Affect: Mood normal.        Behavior: Behavior normal.      Assessment/Plan: Please see individual problem list.  Skin lesion Assessment & Plan: The wound has persisted for a year, causing pain and bleeding, with diabetes complicating healing. Previous antibiotics were only partially  effective. Prescribe Keflex  and topical mupirocin . Advise air exposure for the wound, covering it if there's a risk of friction. Instruct to avoid Neosporin and emphasize the importance of glycemic control. Return precautions given to patient.   Orders: -     Cephalexin ; Take 1 capsule (500 mg total) by mouth 4 (four) times daily.  Dispense: 28 capsule; Refill: 0 -     Mupirocin ; Apply 1 Application topically 2 (two) times daily.  Dispense: 22 g; Refill: 0  Type 2 diabetes mellitus with neurological complications (HCC) Assessment & Plan: Glycemic control is poor with an A1c of 11%, delaying wound healing. Ensure he receives Tresiba  insulin  from patient assistance and address the glucose sensor-phone linking issue. Reinforce the need for improved glycemic control. Encourage healthy diet. Follow up with PCP.       Return if symptoms worsen or fail to improve.   Cameron Glance, NP-C [facility]

## 2024-06-01 NOTE — Telephone Encounter (Signed)
Given to pt

## 2024-06-05 DIAGNOSIS — F1199 Opioid use, unspecified with unspecified opioid-induced disorder: Secondary | ICD-10-CM | POA: Diagnosis not present

## 2024-06-08 DIAGNOSIS — G894 Chronic pain syndrome: Secondary | ICD-10-CM | POA: Diagnosis not present

## 2024-06-08 DIAGNOSIS — Z79891 Long term (current) use of opiate analgesic: Secondary | ICD-10-CM | POA: Diagnosis not present

## 2024-06-11 ENCOUNTER — Other Ambulatory Visit (INDEPENDENT_AMBULATORY_CARE_PROVIDER_SITE_OTHER)

## 2024-06-11 DIAGNOSIS — E7800 Pure hypercholesterolemia, unspecified: Secondary | ICD-10-CM

## 2024-06-11 DIAGNOSIS — E1149 Type 2 diabetes mellitus with other diabetic neurological complication: Secondary | ICD-10-CM | POA: Diagnosis not present

## 2024-06-11 DIAGNOSIS — I10 Essential (primary) hypertension: Secondary | ICD-10-CM

## 2024-06-11 DIAGNOSIS — D649 Anemia, unspecified: Secondary | ICD-10-CM | POA: Diagnosis not present

## 2024-06-11 LAB — LIPID PANEL
Cholesterol: 118 mg/dL (ref 0–200)
HDL: 44.1 mg/dL (ref >39.00)
LDL Cholesterol: 47 mg/dL (ref 0–99)
NonHDL: 73.88
Total CHOL/HDL Ratio: 3
Triglycerides: 135 mg/dL (ref 0.0–149.0)
VLDL: 27 mg/dL (ref 0.0–40.0)

## 2024-06-11 LAB — HEPATIC FUNCTION PANEL
ALT: 25 U/L (ref 0–53)
AST: 31 U/L (ref 0–37)
Albumin: 4.2 g/dL (ref 3.5–5.2)
Alkaline Phosphatase: 68 U/L (ref 39–117)
Bilirubin, Direct: 0.1 mg/dL (ref 0.0–0.3)
Total Bilirubin: 0.7 mg/dL (ref 0.2–1.2)
Total Protein: 6.8 g/dL (ref 6.0–8.3)

## 2024-06-11 LAB — BASIC METABOLIC PANEL WITH GFR
BUN: 9 mg/dL (ref 6–23)
CO2: 27 meq/L (ref 19–32)
Calcium: 9.2 mg/dL (ref 8.4–10.5)
Chloride: 99 meq/L (ref 96–112)
Creatinine, Ser: 0.78 mg/dL (ref 0.40–1.50)
GFR: 100.69 mL/min (ref >60.00)
Glucose, Bld: 186 mg/dL — ABNORMAL HIGH (ref 70–99)
Potassium: 3.7 meq/L (ref 3.5–5.1)
Sodium: 135 meq/L (ref 135–145)

## 2024-06-11 LAB — HEMOGLOBIN A1C: Hgb A1c MFr Bld: 10.9 % — ABNORMAL HIGH (ref 4.6–6.5)

## 2024-06-12 DIAGNOSIS — F1199 Opioid use, unspecified with unspecified opioid-induced disorder: Secondary | ICD-10-CM | POA: Diagnosis not present

## 2024-06-12 LAB — IBC + FERRITIN
Ferritin: 14.9 ng/mL — ABNORMAL LOW (ref 22.0–322.0)
Iron: 29 ug/dL — ABNORMAL LOW (ref 42–165)
Saturation Ratios: 6.2 % — ABNORMAL LOW (ref 20.0–50.0)
TIBC: 466.2 ug/dL — ABNORMAL HIGH (ref 250.0–450.0)
Transferrin: 333 mg/dL (ref 212.0–360.0)

## 2024-06-13 ENCOUNTER — Ambulatory Visit: Admitting: Internal Medicine

## 2024-06-13 ENCOUNTER — Encounter: Payer: Self-pay | Admitting: Internal Medicine

## 2024-06-13 ENCOUNTER — Encounter: Payer: Self-pay | Admitting: Nurse Practitioner

## 2024-06-13 VITALS — BP 120/70 | HR 79 | Resp 16 | Ht 68.0 in | Wt 225.0 lb

## 2024-06-13 DIAGNOSIS — E7800 Pure hypercholesterolemia, unspecified: Secondary | ICD-10-CM

## 2024-06-13 DIAGNOSIS — G8929 Other chronic pain: Secondary | ICD-10-CM

## 2024-06-13 DIAGNOSIS — I700 Atherosclerosis of aorta: Secondary | ICD-10-CM

## 2024-06-13 DIAGNOSIS — L089 Local infection of the skin and subcutaneous tissue, unspecified: Secondary | ICD-10-CM

## 2024-06-13 DIAGNOSIS — K219 Gastro-esophageal reflux disease without esophagitis: Secondary | ICD-10-CM | POA: Diagnosis not present

## 2024-06-13 DIAGNOSIS — E1149 Type 2 diabetes mellitus with other diabetic neurological complication: Secondary | ICD-10-CM | POA: Diagnosis not present

## 2024-06-13 DIAGNOSIS — Z7984 Long term (current) use of oral hypoglycemic drugs: Secondary | ICD-10-CM | POA: Diagnosis not present

## 2024-06-13 DIAGNOSIS — K7460 Unspecified cirrhosis of liver: Secondary | ICD-10-CM

## 2024-06-13 DIAGNOSIS — K5900 Constipation, unspecified: Secondary | ICD-10-CM

## 2024-06-13 DIAGNOSIS — I10 Essential (primary) hypertension: Secondary | ICD-10-CM | POA: Diagnosis not present

## 2024-06-13 DIAGNOSIS — M545 Low back pain, unspecified: Secondary | ICD-10-CM | POA: Diagnosis not present

## 2024-06-13 MED ORDER — METFORMIN HCL ER 500 MG PO TB24: 500.0000 mg | ORAL_TABLET | Freq: Every day | ORAL | 2 refills | Status: DC

## 2024-06-13 MED ORDER — INTEGRA 62.5-62.5-40-3 MG PO CAPS: ORAL_CAPSULE | ORAL | 1 refills | Status: DC

## 2024-06-13 MED ORDER — METOPROLOL SUCCINATE ER 50 MG PO TB24: ORAL_TABLET | ORAL | 1 refills | Status: DC

## 2024-06-13 NOTE — Progress Notes (Signed)
 Subjective:    [name], male    DOB: [birth year], 53 y.o.   MRN: [id number]  Patient here for  Chief Complaint  Patient presents with   Medical Management of Chronic Issues    HPI Here for a scheduled follow up - follow up regarding diabetes, hypercholesterolemia and hypertension. He was seen 06/01/24 - persistent skin lesion/infection. Treated with topical bactroban  and keflex . Lesion today - improved. Saw GI 05/15/24 - recommended right upper quadrant ultrasound and EGD/colonoscopy. Recommended lactulose . Not taking lactulose . Discussed the need to start. Reports is scheduled for colonoscopy next week, but he reports he might have to change given his father might have a procedure. He will call and cancel. Abdominal ultrasound revealed no liver mass. Recommended f/u ultrasound in 6 months. He is taking tresiba . Has titrated up to 28 units. Never started the short acting insulin . Not checking sugars. Discussed the importance of checking his sugars and getting his diabetes under better control. Discussed low carb diet and exercise. No chest pain. Breathing stable. No increased cough or congestion. No abdominal pain reported. Bowels are some better due to being on less pain medication - has been unable to get.    Past Medical History:  Diagnosis Date   Allergy    Anemia    Aortic atherosclerosis (HCC)    Chronic back pain    Chronic back pain    Cirrhosis of liver (HCC)    Complication of anesthesia    hard time to wake up x1   COVID-19    DM (diabetes mellitus), type 2 (HCC)    GERD (gastroesophageal reflux disease)    Gout    Hypercholesterolemia    Hypertension    Liver lesion    Migraines    Obesity    Sleep apnea    Subarachnoid hemorrhage (HCC) 11/2020   no surgery   Thrombocytopenia (HCC)    TIA (transient ischemic attack)    Past Surgical History:  Procedure Laterality Date   BACK SURGERY  08/12/2014   multiple   BOTOX  INJECTION N/A 11/04/2022    Procedure: BOTOX  INJECTION;  Surgeon: Jordis Laneta FALCON, MD;  Location: [facility] ORS;  Service: General;  Laterality: N/A;   CHOLECYSTECTOMY     COLONOSCOPY WITH PROPOFOL  N/A 03/11/2022   Procedure: COLONOSCOPY WITH PROPOFOL ;  Surgeon: Therisa Bi, MD;  Location: [facility] ENDOSCOPY;  Service: Gastroenterology;  Laterality: N/A;   COLONOSCOPY WITH PROPOFOL  N/A 11/17/2022   Procedure: COLONOSCOPY WITH PROPOFOL ;  Surgeon: Therisa Bi, MD;  Location: [facility] ENDOSCOPY;  Service: Gastroenterology;  Laterality: N/A;   COLONOSCOPY WITH PROPOFOL  N/A 11/24/2022   Procedure: COLONOSCOPY WITH PROPOFOL ;  Surgeon: Therisa Bi, MD;  Location: [facility] ENDOSCOPY;  Service: Gastroenterology;  Laterality: N/A;   ESOPHAGOGASTRODUODENOSCOPY N/A 03/11/2022   Procedure: ESOPHAGOGASTRODUODENOSCOPY (EGD);  Surgeon: Therisa Bi, MD;  Location: [facility] ENDOSCOPY;  Service: Gastroenterology;  Laterality: N/A;   KNEE ARTHROSCOPY Left    Dr CLIFTON   SPHINCTEROTOMY N/A 11/04/2022   Procedure: SPHINCTEROTOMY;  Surgeon: Jordis Laneta FALCON, MD;  Location: [facility] ORS;  Service: General;  Laterality: N/A;   Family History  Problem Relation Age of Onset   Cancer Father        germ cell (retroperitoneal)   Coronary artery disease Other        s/p stent (age 69)   Hypertension Paternal Grandfather    Kidney cancer Paternal Grandfather    Diabetes Paternal Grandfather    Diabetes Maternal Grandfather    Heart disease Cousin  MI - age 74   Social History   Socioeconomic History   Marital status: Married    Spouse name: Not on file   Number of children: 2   Years of education: 12   Highest education level: Not on file  Occupational History   Occupation: disabled    Employer: [employer]  Tobacco Use   Smoking status: Never   Smokeless tobacco: Never  Vaping Use   Vaping status: Never Used  Substance and Sexual Activity   Alcohol use: No    Alcohol/week: 0.0 standard drinks of alcohol   Drug use: Never   Sexual activity: Not  on file  Other Topics Concern   Not on file  Social History Narrative   Lives with girlfriend in a one story home.  Has 2 sons.  Trying to get disability due to several back issues.  Education: high school.    Social Drivers of Corporate investment banker Strain: Low Risk  (05/15/2024)   Received [facility]   Overall Financial Resource Strain (CARDIA)    Difficulty of Paying Living Expenses: Not hard at all  Food Insecurity: No Food Insecurity (05/15/2024)   Received from [facility]   Hunger Vital Sign    Within the past 12 months, you worried that your food would run out before you got the money to buy more.: Never true    Within the past 12 months, the food you bought just didn't last and you didn't have money to get more.: Never true  Transportation Needs: No Transportation Needs (05/15/2024)   Received from [facility] - Transportation    In the past 12 months, has lack of transportation kept you from medical appointments or from getting medications?: No    Lack of Transportation (Non-Medical): No  Physical Activity: Not on file  Stress: No Stress Concern Present (02/28/2023)   [employer] of Occupational Health - Occupational Stress Questionnaire    Feeling of Stress : Not at all  Social Connections: Unknown (02/28/2023)   Social Connection and Isolation Panel    Frequency of Communication with Friends and Family: Not on file    Frequency of Social Gatherings with Friends and Family: Not on file    Attends Religious Services: Not on file    Active Member of Clubs or Organizations: Not on file    Attends Banker Meetings: Not on file    Marital Status: Married     Review of Systems  Constitutional:  Negative for appetite change and unexpected weight change.  HENT:  Negative for congestion and sinus pressure.   Respiratory:  Negative for cough, chest tightness and shortness of breath.    Cardiovascular:  Negative for chest pain, palpitations and leg swelling.  Gastrointestinal:  Positive for constipation. Negative for abdominal pain and vomiting.  Genitourinary:  Negative for difficulty urinating and dysuria.  Musculoskeletal:  Negative for joint swelling and myalgias.  Skin:  Negative for color change and rash.  Neurological:  Negative for dizziness and headaches.  Psychiatric/Behavioral:  Negative for agitation and dysphoric mood.        Objective:     BP 120/70   Pulse 79   Resp 16   Ht 5' 8 (1.727 m)   Wt 225 lb (102.1 kg)   SpO2 98%   BMI 34.21 kg/m  Wt Readings from Last 3 Encounters:  06/13/24 225 lb (102.1 kg)  06/01/24 220 lb  9.6 oz (100.1 kg)  04/13/24 219 lb 9.6 oz (99.6 kg)    Physical Exam Constitutional:      General: He is not in acute distress.    Appearance: Normal appearance. He is well-developed.  HENT:     Head: Normocephalic and atraumatic.     Right Ear: External ear normal.     Left Ear: External ear normal.  Eyes:     General: No scleral icterus.       Right eye: No discharge.        Left eye: No discharge.  Cardiovascular:     Rate and Rhythm: Normal rate and regular rhythm.  Pulmonary:     Effort: Pulmonary effort is normal. No respiratory distress.     Breath sounds: Normal breath sounds.  Abdominal:     General: Bowel sounds are normal.     Palpations: Abdomen is soft.     Tenderness: There is no abdominal tenderness.  Musculoskeletal:        General: No swelling or tenderness.     Cervical back: Neck supple. No tenderness.  Lymphadenopathy:     Cervical: No cervical adenopathy.  Skin:    Findings: No erythema or rash.  Neurological:     Mental Status: He is alert.  Psychiatric:        Mood and Affect: Mood normal.        Behavior: Behavior normal.         Outpatient Encounter Medications as of 06/13/2024  Medication Sig   Fe Fum-FePoly-Vit C-Vit B3 (INTEGRA) 62.5-62.5-40-3 MG CAPS One capsule daily    allopurinol  (ZYLOPRIM ) 300 MG tablet TAKE 1 TABLET BY MOUTH EVERY DAY   atorvastatin  (LIPITOR) 40 MG tablet TAKE 1 TABLET BY MOUTH EVERY DAY   bisacodyl (DULCOLAX) 5 MG EC tablet Take 5 mg by mouth daily as needed for moderate constipation.   cephALEXin  (KEFLEX ) 500 MG capsule Take 1 capsule (500 mg total) by mouth 4 (four) times daily.   Continuous Glucose Sensor (FREESTYLE LIBRE 3 PLUS SENSOR) MISC Use to check blood glucose continuously. Change sensor every 15 days.   insulin  degludec (TRESIBA  FLEXTOUCH) 200 UNIT/ML FlexTouch Pen Inject 24 Units into the skin daily.   Insulin  Pen Needle (PEN NEEDLES) 32G X 4 MM MISC Use to inject medication daily   metaxalone (SKELAXIN) 800 MG tablet Take 800 mg by mouth 3 (three) times daily.   metFORMIN  (GLUCOPHAGE -XR) 500 MG 24 hr tablet Take 1 tablet (500 mg total) by mouth daily with breakfast.   metoprolol  succinate (TOPROL -XL) 50 MG 24 hr tablet TAKE 2 TABLETS BY MOUTH DAILY WITH OR IMMEDIATELY FOLLOWING A MEAL.   mupirocin  ointment (BACTROBAN ) 2 % Apply 1 Application topically 2 (two) times daily.   oxyCODONE  (ROXICODONE ) 15 MG immediate release tablet Take 15 mg by mouth every 4 (four) hours.   sildenafil  (REVATIO ) 20 MG tablet Take 1-5 tablets 1 hour prior to intercourse as needed   triamterene -hydrochlorothiazide (MAXZIDE-25) 37.5-25 MG tablet TAKE 1 TABLET BY MOUTH EVERY DAY   [DISCONTINUED] insulin  aspart (NOVOLOG  FLEXPEN) 100 UNIT/ML FlexPen Start 5 units sq AC breakfast. Increase only as instructed by provider. Max dose 10u TIDAC. Disp 9 mL for 30 day supply.   [DISCONTINUED] metFORMIN  (GLUCOPHAGE -XR) 500 MG 24 hr tablet Take 1 tablet (500 mg total) by mouth daily with breakfast.   [DISCONTINUED] metoprolol  succinate (TOPROL -XL) 50 MG 24 hr tablet TAKE 2 TABLETS BY MOUTH DAILY WITH OR IMMEDIATELY FOLLOWING A MEAL.   No facility-administered encounter  medications on file as of 06/13/2024.     Lab Results  Component Value Date   WBC 7.1  03/12/2024   HGB 12.8 (L) 03/12/2024   HCT 39.8 03/12/2024   PLT 129.0 (L) 03/12/2024   GLUCOSE 186 (H) 06/11/2024   CHOL 118 06/11/2024   TRIG 135.0 06/11/2024   HDL 44.10 06/11/2024   LDLDIRECT 53.0 09/08/2022   LDLCALC 47 06/11/2024   ALT 25 06/11/2024   AST 31 06/11/2024   NA 135 06/11/2024   K 3.7 06/11/2024   CL 99 06/11/2024   CREATININE 0.78 06/11/2024   BUN 9 06/11/2024   CO2 27 06/11/2024   TSH 2.87 06/17/2023   PSA 0.41 06/17/2023   INR 1.2 (H) 03/12/2024   HGBA1C 10.9 (H) 06/11/2024    US  Abdomen Limited RUQ (LIVER/GB) Result Date: 05/22/2024 CLINICAL DATA:  screen for [facility] liver cirrhosis EXAM: ULTRASOUND ABDOMEN LIMITED RIGHT UPPER QUADRANT COMPARISON:  December 20, 2022, May 19, 2022 FINDINGS: Gallbladder: Surgically absent Common bile duct: Diameter: Visualized portion measures 2 mm, within normal limits. Liver: Benign cyst is noted measuring 12 mm. Heterogeneous, coarsened, and overall increased parenchymal echogenicity with poor acoustic penetration. Nodular liver contour. Portal vein is patent on color Doppler imaging with normal direction of blood flow towards the liver. Other: None. IMPRESSION: Cirrhotic liver morphology. No focal suspicious hepatic lesion is identified. Electronically Signed   By: Corean Salter M.D.   On: 05/22/2024 12:23       Assessment & Plan:  Aortic atherosclerosis (HCC) Assessment & Plan: Off cholesterol medication due to underlying liver issues. Follow.    Type 2 diabetes mellitus with neurological complications [facility]) Assessment & Plan: He is taking tresiba . Did titrate up to 28 units daily. Never started the short acting insulin . Not checking sugars. Not watching diet. Discussed importance of getting sugars under control. Refer to pharmacy team for pt assistance and help managing medications. Also refer to endocrinology - given poorly controlled diabetes.   Orders: -     Hemoglobin A1c; Future -     Ambulatory referral to  Endocrinology  Primary hypertension Assessment & Plan: Blood pressure doing well.  Continue triam/hctz and metoprolol .  Continue to follow pressures. Follow metabolic panel.   Orders: -     Basic metabolic panel with GFR; Future  Hypercholesterolemia Assessment & Plan: Was on lipitor. Low cholesterol diet and exercise.  Follow lipid panel and liver function tests.  Statin stopped while pursuing liver w/up. Remain off for now.   Orders: -     Hepatic function panel; Future -     Lipid panel; Future  Chronic low back pain, unspecified back pain laterality, unspecified whether sciatica present Assessment & Plan: Followed by pain clinic.    Cirrhosis of liver without ascites, unspecified hepatic cirrhosis type [facility]) Assessment & Plan: Saw GI 05/15/24 - recommended right upper quadrant ultrasound and EGD/colonoscopy. Recommended lactulose . Not taking lactulose . Discussed the need to start. Abdominal ultrasound revealed no liver mass. Recommended f/u ultrasound in 6 months. Continue f/u with GI.    Constipation, unspecified constipation type Assessment & Plan: Takes narcotic pain medication. Have discussed bowel regimen - to keep bowels moving.  Saw GI.  Colonoscopy 03/11/22 - Four 2 to 3 mm polyps in the cecum, removed with a cold biopsy forceps. Resected and retrieved. Two 4 to 6 mm polyps in the cecum, removed with a cold snare. Resected and retrieved. Three 5 to 7 mm polyps in the ascending colon, removed with a cold snare.  Resected and retrieved. One 15 mm polyp in the ascending colon, removed with mucosal resection. Resected and retrieved. Clip was placed. The examination was otherwise normal on direct and retroflexion views. Mucosal resection was performed. Resection and retrieval were complete. Continue f/u with GI.  Recommended f/u colonoscopy 6-8 months. Colonoscopy 11/2022 - normal.  Recommended f/u in one year. Has not followed up. Saw GI and is scheduled for colonoscopy next week.  States might have to postpone due to his father's medical procedure. He will notify and reschedule. Discussed taking lactulose .    Gastroesophageal reflux disease, unspecified whether esophagitis present Assessment & Plan: Continue protonix .    Superficial skin infection Assessment & Plan: Improved. Follow.    Other orders -     metFORMIN  HCl ER; Take 1 tablet (500 mg total) by mouth daily with breakfast.  Dispense: 90 tablet; Refill: 2 -     Metoprolol  Succinate ER; TAKE 2 TABLETS BY MOUTH DAILY WITH OR IMMEDIATELY FOLLOWING A MEAL.  Dispense: 180 tablet; Refill: 1 -     Integra; One capsule daily  Dispense: 30 capsule; Refill: 1     Allena Hamilton, MD

## 2024-06-13 NOTE — Assessment & Plan Note (Signed)
 Glycemic control is poor with an A1c of 11%, delaying wound healing. Ensure he receives Tresiba  insulin  from patient assistance and address the glucose sensor-phone linking issue. Reinforce the need for improved glycemic control. Encourage healthy diet. Follow up with PCP.

## 2024-06-13 NOTE — Assessment & Plan Note (Signed)
 The wound has persisted for a year, causing pain and bleeding, with diabetes complicating healing. Previous antibiotics were only partially effective. Prescribe Keflex  and topical mupirocin . Advise air exposure for the wound, covering it if there's a risk of friction. Instruct to avoid Neosporin and emphasize the importance of glycemic control. Return precautions given to patient.

## 2024-06-17 ENCOUNTER — Encounter: Payer: Self-pay | Admitting: Internal Medicine

## 2024-06-17 NOTE — Assessment & Plan Note (Signed)
Improved.  Follow.  

## 2024-06-17 NOTE — Assessment & Plan Note (Signed)
 Blood pressure doing well.  Continue triam/hctz and metoprolol .  Continue to follow pressures. Follow metabolic panel.

## 2024-06-17 NOTE — Assessment & Plan Note (Signed)
Followed by pain clinic.  

## 2024-06-17 NOTE — Assessment & Plan Note (Signed)
 Off cholesterol medication due to underlying liver issues. Follow.

## 2024-06-17 NOTE — Assessment & Plan Note (Signed)
 Takes narcotic pain medication. Have discussed bowel regimen - to keep bowels moving.  Saw GI.  Colonoscopy 03/11/22 - Four 2 to 3 mm polyps in the cecum, removed with a cold biopsy forceps. Resected and retrieved. Two 4 to 6 mm polyps in the cecum, removed with a cold snare. Resected and retrieved. Three 5 to 7 mm polyps in the ascending colon, removed with a cold snare. Resected and retrieved. One 15 mm polyp in the ascending colon, removed with mucosal resection. Resected and retrieved. Clip was placed. The examination was otherwise normal on direct and retroflexion views. Mucosal resection was performed. Resection and retrieval were complete. Continue f/u with GI.  Recommended f/u colonoscopy 6-8 months. Colonoscopy 11/2022 - normal.  Recommended f/u in one year. Has not followed up. Saw GI and is scheduled for colonoscopy next week. States might have to postpone due to his father's medical procedure. He will notify and reschedule. Discussed taking lactulose .

## 2024-06-17 NOTE — Assessment & Plan Note (Signed)
 Continue protonix

## 2024-06-17 NOTE — Assessment & Plan Note (Signed)
 He is taking tresiba . Did titrate up to 28 units daily. Never started the short acting insulin . Not checking sugars. Not watching diet. Discussed importance of getting sugars under control. Refer to pharmacy team for pt assistance and help managing medications. Also refer to endocrinology - given poorly controlled diabetes.

## 2024-06-17 NOTE — Assessment & Plan Note (Signed)
 Saw GI 05/15/24 - recommended right upper quadrant ultrasound and EGD/colonoscopy. Recommended lactulose . Not taking lactulose . Discussed the need to start. Abdominal ultrasound revealed no liver mass. Recommended f/u ultrasound in 6 months. Continue f/u with GI.

## 2024-06-17 NOTE — Assessment & Plan Note (Signed)
 Was on lipitor. Low cholesterol diet and exercise.  Follow lipid panel and liver function tests.  Statin stopped while pursuing liver w/up. Remain off for now.

## 2024-06-18 ENCOUNTER — Ambulatory Visit: Admission: RE | Admit: 2024-06-18 | Source: Home / Self Care | Admitting: Gastroenterology

## 2024-06-18 SURGERY — COLONOSCOPY
Anesthesia: General

## 2024-06-19 ENCOUNTER — Encounter: Payer: Self-pay | Admitting: Pharmacist

## 2024-06-19 NOTE — Progress Notes (Signed)
Attempted to contact patient to reschedule appointment for medication management. Left HIPAA compliant message for patient to return my call at their convenience.   

## 2024-06-20 DIAGNOSIS — F1199 Opioid use, unspecified with unspecified opioid-induced disorder: Secondary | ICD-10-CM | POA: Diagnosis not present

## 2024-06-26 DIAGNOSIS — F1199 Opioid use, unspecified with unspecified opioid-induced disorder: Secondary | ICD-10-CM | POA: Diagnosis not present

## 2024-06-29 ENCOUNTER — Other Ambulatory Visit: Payer: Self-pay | Admitting: Pharmacist

## 2024-06-29 NOTE — Progress Notes (Signed)
Attempted to contact patient to reschedule appointment for medication management. Left HIPAA compliant message for patient to return my call at their convenience.   

## 2024-07-03 DIAGNOSIS — F1199 Opioid use, unspecified with unspecified opioid-induced disorder: Secondary | ICD-10-CM | POA: Diagnosis not present

## 2024-07-06 DIAGNOSIS — M961 Postlaminectomy syndrome, not elsewhere classified: Secondary | ICD-10-CM | POA: Diagnosis not present

## 2024-07-06 DIAGNOSIS — M545 Low back pain, unspecified: Secondary | ICD-10-CM | POA: Diagnosis not present

## 2024-07-06 DIAGNOSIS — M5137 Other intervertebral disc degeneration, lumbosacral region with discogenic back pain only: Secondary | ICD-10-CM | POA: Diagnosis not present

## 2024-07-09 ENCOUNTER — Encounter: Admission: RE | Payer: Self-pay | Source: Home / Self Care

## 2024-07-09 ENCOUNTER — Ambulatory Visit: Admission: RE | Admit: 2024-07-09 | Source: Home / Self Care | Admitting: Gastroenterology

## 2024-07-09 SURGERY — COLONOSCOPY
Anesthesia: General

## 2024-07-10 DIAGNOSIS — F1199 Opioid use, unspecified with unspecified opioid-induced disorder: Secondary | ICD-10-CM | POA: Diagnosis not present

## 2024-07-17 DIAGNOSIS — F1199 Opioid use, unspecified with unspecified opioid-induced disorder: Secondary | ICD-10-CM | POA: Diagnosis not present

## 2024-07-20 DIAGNOSIS — Z79891 Long term (current) use of opiate analgesic: Secondary | ICD-10-CM | POA: Diagnosis not present

## 2024-07-20 DIAGNOSIS — M545 Low back pain, unspecified: Secondary | ICD-10-CM | POA: Diagnosis not present

## 2024-07-20 DIAGNOSIS — G894 Chronic pain syndrome: Secondary | ICD-10-CM | POA: Diagnosis not present

## 2024-07-20 DIAGNOSIS — M5137 Other intervertebral disc degeneration, lumbosacral region with discogenic back pain only: Secondary | ICD-10-CM | POA: Diagnosis not present

## 2024-07-20 DIAGNOSIS — M961 Postlaminectomy syndrome, not elsewhere classified: Secondary | ICD-10-CM | POA: Diagnosis not present

## 2024-07-24 DIAGNOSIS — F1199 Opioid use, unspecified with unspecified opioid-induced disorder: Secondary | ICD-10-CM | POA: Diagnosis not present

## 2024-07-31 DIAGNOSIS — F1199 Opioid use, unspecified with unspecified opioid-induced disorder: Secondary | ICD-10-CM | POA: Diagnosis not present

## 2024-08-07 DIAGNOSIS — F1199 Opioid use, unspecified with unspecified opioid-induced disorder: Secondary | ICD-10-CM | POA: Diagnosis not present

## 2024-08-10 NOTE — Progress Notes (Signed)
 Cameron Sanchez                                          MRN: [id number]   08/10/2024   The [facility] Quality Team Specialist reviewed this patient medical record for the purposes of chart review for care gap closure. The following were reviewed: chart review for care gap closure-glycemic status assessment and kidney health evaluation for diabetes:eGFR  and uACR.    [facility] Quality Team

## 2024-08-14 DIAGNOSIS — F1199 Opioid use, unspecified with unspecified opioid-induced disorder: Secondary | ICD-10-CM | POA: Diagnosis not present

## 2024-08-17 DIAGNOSIS — M5137 Other intervertebral disc degeneration, lumbosacral region with discogenic back pain only: Secondary | ICD-10-CM | POA: Diagnosis not present

## 2024-08-17 DIAGNOSIS — G894 Chronic pain syndrome: Secondary | ICD-10-CM | POA: Diagnosis not present

## 2024-08-17 DIAGNOSIS — M961 Postlaminectomy syndrome, not elsewhere classified: Secondary | ICD-10-CM | POA: Diagnosis not present

## 2024-08-17 DIAGNOSIS — M545 Low back pain, unspecified: Secondary | ICD-10-CM | POA: Diagnosis not present

## 2024-08-17 DIAGNOSIS — Z79891 Long term (current) use of opiate analgesic: Secondary | ICD-10-CM | POA: Diagnosis not present

## 2024-08-20 ENCOUNTER — Ambulatory Visit: Admit: 2024-08-20 | Admitting: Gastroenterology

## 2024-08-20 SURGERY — COLONOSCOPY
Anesthesia: General

## 2024-08-21 DIAGNOSIS — F1199 Opioid use, unspecified with unspecified opioid-induced disorder: Secondary | ICD-10-CM | POA: Diagnosis not present

## 2024-08-28 DIAGNOSIS — F1199 Opioid use, unspecified with unspecified opioid-induced disorder: Secondary | ICD-10-CM | POA: Diagnosis not present

## 2024-09-04 DIAGNOSIS — F1199 Opioid use, unspecified with unspecified opioid-induced disorder: Secondary | ICD-10-CM | POA: Diagnosis not present

## 2024-09-11 DIAGNOSIS — F1199 Opioid use, unspecified with unspecified opioid-induced disorder: Secondary | ICD-10-CM | POA: Diagnosis not present

## 2024-09-13 ENCOUNTER — Other Ambulatory Visit (INDEPENDENT_AMBULATORY_CARE_PROVIDER_SITE_OTHER)

## 2024-09-13 DIAGNOSIS — I10 Essential (primary) hypertension: Secondary | ICD-10-CM | POA: Diagnosis not present

## 2024-09-13 DIAGNOSIS — E1149 Type 2 diabetes mellitus with other diabetic neurological complication: Secondary | ICD-10-CM | POA: Diagnosis not present

## 2024-09-13 DIAGNOSIS — E7800 Pure hypercholesterolemia, unspecified: Secondary | ICD-10-CM | POA: Diagnosis not present

## 2024-09-13 LAB — BASIC METABOLIC PANEL WITH GFR
BUN: 12 mg/dL (ref 6–23)
CO2: 26 meq/L (ref 19–32)
Calcium: 9.2 mg/dL (ref 8.4–10.5)
Chloride: 98 meq/L (ref 96–112)
Creatinine, Ser: 0.86 mg/dL (ref 0.40–1.50)
GFR: 97.58 mL/min (ref >60.00)
Glucose, Bld: 279 mg/dL — ABNORMAL HIGH (ref 70–99)
Potassium: 4.1 meq/L (ref 3.5–5.1)
Sodium: 133 meq/L — ABNORMAL LOW (ref 135–145)

## 2024-09-13 LAB — HEMOGLOBIN A1C: Hgb A1c MFr Bld: 11.8 % — ABNORMAL HIGH (ref 4.6–6.5)

## 2024-09-13 LAB — HEPATIC FUNCTION PANEL
ALT: 25 U/L (ref 0–53)
AST: 31 U/L (ref 0–37)
Albumin: 4.3 g/dL (ref 3.5–5.2)
Alkaline Phosphatase: 65 U/L (ref 39–117)
Bilirubin, Direct: 0.2 mg/dL (ref 0.0–0.3)
Total Bilirubin: 0.8 mg/dL (ref 0.2–1.2)
Total Protein: 6.9 g/dL (ref 6.0–8.3)

## 2024-09-13 LAB — LIPID PANEL
Cholesterol: 130 mg/dL (ref 0–200)
HDL: 44.9 mg/dL (ref >39.00)
LDL Cholesterol: 46 mg/dL (ref 0–99)
NonHDL: 85.57
Total CHOL/HDL Ratio: 3
Triglycerides: 198 mg/dL — ABNORMAL HIGH (ref 0.0–149.0)
VLDL: 39.6 mg/dL (ref 0.0–40.0)

## 2024-09-14 ENCOUNTER — Ambulatory Visit: Payer: Self-pay | Admitting: Internal Medicine

## 2024-09-14 ENCOUNTER — Other Ambulatory Visit

## 2024-09-14 DIAGNOSIS — M5137 Other intervertebral disc degeneration, lumbosacral region with discogenic back pain only: Secondary | ICD-10-CM | POA: Diagnosis not present

## 2024-09-14 DIAGNOSIS — G894 Chronic pain syndrome: Secondary | ICD-10-CM | POA: Diagnosis not present

## 2024-09-14 DIAGNOSIS — Z79891 Long term (current) use of opiate analgesic: Secondary | ICD-10-CM | POA: Diagnosis not present

## 2024-09-14 DIAGNOSIS — M961 Postlaminectomy syndrome, not elsewhere classified: Secondary | ICD-10-CM | POA: Diagnosis not present

## 2024-09-17 DIAGNOSIS — F1199 Opioid use, unspecified with unspecified opioid-induced disorder: Secondary | ICD-10-CM | POA: Diagnosis not present

## 2024-09-18 ENCOUNTER — Encounter: Payer: Self-pay | Admitting: Internal Medicine

## 2024-09-18 ENCOUNTER — Ambulatory Visit: Admitting: Internal Medicine

## 2024-09-18 ENCOUNTER — Other Ambulatory Visit: Payer: Self-pay | Admitting: Internal Medicine

## 2024-09-18 VITALS — BP 136/78 | HR 72 | Temp 97.8°F | Ht 68.0 in | Wt 224.0 lb

## 2024-09-18 DIAGNOSIS — E1149 Type 2 diabetes mellitus with other diabetic neurological complication: Secondary | ICD-10-CM

## 2024-09-18 DIAGNOSIS — K219 Gastro-esophageal reflux disease without esophagitis: Secondary | ICD-10-CM

## 2024-09-18 DIAGNOSIS — I10 Essential (primary) hypertension: Secondary | ICD-10-CM

## 2024-09-18 DIAGNOSIS — D649 Anemia, unspecified: Secondary | ICD-10-CM

## 2024-09-18 DIAGNOSIS — K7460 Unspecified cirrhosis of liver: Secondary | ICD-10-CM | POA: Diagnosis not present

## 2024-09-18 DIAGNOSIS — D696 Thrombocytopenia, unspecified: Secondary | ICD-10-CM

## 2024-09-18 DIAGNOSIS — E7800 Pure hypercholesterolemia, unspecified: Secondary | ICD-10-CM | POA: Diagnosis not present

## 2024-09-18 DIAGNOSIS — K5900 Constipation, unspecified: Secondary | ICD-10-CM

## 2024-09-18 DIAGNOSIS — N50812 Left testicular pain: Secondary | ICD-10-CM

## 2024-09-18 LAB — MICROALBUMIN / CREATININE URINE RATIO
Creatinine,U: 75.4 mg/dL
Microalb Creat Ratio: UNDETERMINED mg/g (ref 0.0–30.0)
Microalb, Ur: <0.7 mg/dL

## 2024-09-18 MED ORDER — FREESTYLE LIBRE 3 PLUS SENSOR MISC: 5 refills | Status: AC

## 2024-09-18 MED ORDER — TRESIBA FLEXTOUCH 200 UNIT/ML ~~LOC~~ SOPN: 32.0000 [IU] | PEN_INJECTOR | Freq: Every day | SUBCUTANEOUS | 2 refills | Status: DC

## 2024-09-18 MED ORDER — ALLOPURINOL 300 MG PO TABS: 300.0000 mg | ORAL_TABLET | Freq: Every day | ORAL | 1 refills | Status: AC

## 2024-09-18 MED ORDER — METOPROLOL SUCCINATE ER 50 MG PO TB24: ORAL_TABLET | ORAL | 1 refills | Status: AC

## 2024-09-18 MED ORDER — METFORMIN HCL ER 500 MG PO TB24: 500.0000 mg | ORAL_TABLET | Freq: Every day | ORAL | 2 refills | Status: AC

## 2024-09-18 MED ORDER — TRIAMTERENE-HCTZ 37.5-25 MG PO TABS: 1.0000 | ORAL_TABLET | Freq: Every day | ORAL | 1 refills | Status: AC

## 2024-09-18 NOTE — Progress Notes (Signed)
 Subjective:    [name], male    DOB: Feb 23, 1971, 53 y.o.   MRN: [id number]  Patient here for  Chief Complaint  Patient presents with   Medical Management of Chronic Issues    3 mth f/u    HPI Here for a scheduled follow up - follow up regarding diabetes, hypertension and hypercholesterolemia.  Saw GI 05/15/24 - recommended right upper quadrant ultrasound and EGD/colonoscopy. Recommended lactulose . Scheduled for colonoscopy 09/25/24.  Discussed bowels and importance of keeping his bowels moving. Constipation worsened with pain medication. Sugar has been poorly controlled.  Discussed importance of watching his diet. Monitoring his sugars. Brough in no recorded sugar readings. Have set up CGM on several occasions. Has failed to f/u with [facility] - pharmacy team. Has not made appt with endocrinology. Discussed noncompliance. GI following - liver - ultrasound 05/22/24 - no liver mass. Repeat ultrasound in 6 months. No chest pain reported. Breathing stable.    Past Medical History:  Diagnosis Date   Allergy    Anemia    Aortic atherosclerosis    Chronic back pain    Chronic back pain    Cirrhosis of liver (HCC)    Complication of anesthesia    hard time to wake up x1   COVID-19    DM (diabetes mellitus), type 2 (HCC)    GERD (gastroesophageal reflux disease)    Gout    Hypercholesterolemia    Hypertension    Liver lesion    Migraines    Obesity    Sleep apnea    Subarachnoid hemorrhage (HCC) 11/2020   no surgery   Thrombocytopenia    TIA (transient ischemic attack)    Past Surgical History:  Procedure Laterality Date   BACK SURGERY  08/12/2014   multiple   BOTOX  INJECTION N/A 11/04/2022   Procedure: BOTOX  INJECTION;  Surgeon: Jordis Laneta FALCON, MD;  Location: [facility] ORS;  Service: General;  Laterality: N/A;   CHOLECYSTECTOMY     COLONOSCOPY WITH PROPOFOL  N/A 03/11/2022   Procedure: COLONOSCOPY WITH PROPOFOL ;  Surgeon: Therisa Bi, MD;  Location: [facility] ENDOSCOPY;   Service: Gastroenterology;  Laterality: N/A;   COLONOSCOPY WITH PROPOFOL  N/A 11/17/2022   Procedure: COLONOSCOPY WITH PROPOFOL ;  Surgeon: Therisa Bi, MD;  Location: [facility] ENDOSCOPY;  Service: Gastroenterology;  Laterality: N/A;   COLONOSCOPY WITH PROPOFOL  N/A 11/24/2022   Procedure: COLONOSCOPY WITH PROPOFOL ;  Surgeon: Therisa Bi, MD;  Location: [facility] ENDOSCOPY;  Service: Gastroenterology;  Laterality: N/A;   ESOPHAGOGASTRODUODENOSCOPY N/A 03/11/2022   Procedure: ESOPHAGOGASTRODUODENOSCOPY (EGD);  Surgeon: Therisa Bi, MD;  Location: [facility] ENDOSCOPY;  Service: Gastroenterology;  Laterality: N/A;   KNEE ARTHROSCOPY Left    Dr CLIFTON   SPHINCTEROTOMY N/A 11/04/2022   Procedure: SPHINCTEROTOMY;  Surgeon: Jordis Laneta FALCON, MD;  Location: [facility] ORS;  Service: General;  Laterality: N/A;   Family History  Problem Relation Age of Onset   Cancer Father        germ cell (retroperitoneal)   Coronary artery disease Other        s/p stent (age 18)   Hypertension Paternal Grandfather    Kidney cancer Paternal Grandfather    Diabetes Paternal Grandfather    Diabetes Maternal Grandfather    Heart disease Cousin        MI - age 30   Social History   Socioeconomic History   Marital status: Married    Spouse name: Not on file   Number of children: 2   Years of education: 93  Highest education level: Not on file  Occupational History   Occupation: disabled    Employer: [employer]  Tobacco Use   Smoking status: Never   Smokeless tobacco: Never  Vaping Use   Vaping status: Never Used  Substance and Sexual Activity   Alcohol use: No    Alcohol/week: 0.0 standard drinks of alcohol   Drug use: Never   Sexual activity: Not on file  Other Topics Concern   Not on file  Social History Narrative   Lives with girlfriend in a one story home.  Has 2 sons.  Trying to get disability due to several back issues.  Education: high school.    Social Drivers of Corporate Investment Banker Strain: Low  Risk  (05/15/2024)   Received from [facility]   Overall Financial Resource Strain (CARDIA)    Difficulty of Paying Living Expenses: Not hard at all  Food Insecurity: No Food Insecurity (05/15/2024)   Received [facility]   Hunger Vital Sign    Within the past 12 months, you worried that your food would run out before you got the money to buy more.: Never true    Within the past 12 months, the food you bought just didn't last and you didn't have money to get more.: Never true  Transportation Needs: No Transportation Needs (05/15/2024)   Received from [facility] - Transportation    In the past 12 months, has lack of transportation kept you from medical appointments or from getting medications?: No    Lack of Transportation (Non-Medical): No  Physical Activity: Not on file  Stress: No Stress Concern Present (02/28/2023)   [employer] of Occupational Health - Occupational Stress Questionnaire    Feeling of Stress : Not at all  Social Connections: Unknown (02/28/2023)   Social Connection and Isolation Panel    Frequency of Communication with Friends and Family: Not on file    Frequency of Social Gatherings with Friends and Family: Not on file    Attends Religious Services: Not on file    Active Member of Clubs or Organizations: Not on file    Attends Banker Meetings: Not on file    Marital Status: Married     Review of Systems  Constitutional:  Negative for appetite change and unexpected weight change.  HENT:  Negative for congestion and sinus pressure.   Respiratory:  Negative for cough, chest tightness and shortness of breath.   Cardiovascular:  Negative for chest pain, palpitations and leg swelling.  Gastrointestinal:  Positive for constipation. Negative for abdominal pain and vomiting.  Genitourinary:  Negative for difficulty urinating and dysuria.  Musculoskeletal:  Positive for back pain. Negative  for myalgias.  Skin:  Negative for color change and rash.  Neurological:  Negative for dizziness and headaches.  Psychiatric/Behavioral:  Negative for agitation and dysphoric mood.        Objective:     BP 136/78   Pulse 72   Temp 97.8 F (36.6 C) (Oral)   Ht 5' 8 (1.727 m)   Wt 224 lb (101.6 kg)   SpO2 99%   BMI 34.06 kg/m  Wt Readings from Last 3 Encounters:  09/18/24 224 lb (101.6 kg)  06/13/24 225 lb (102.1 kg)  06/01/24 220 lb 9.6 oz (100.1 kg)    Physical Exam Vitals reviewed.  Constitutional:      General: He is not in acute distress.  Appearance: Normal appearance. He is well-developed.  HENT:     Head: Normocephalic and atraumatic.     Right Ear: External ear normal.     Left Ear: External ear normal.     Mouth/Throat:     Pharynx: No oropharyngeal exudate or posterior oropharyngeal erythema.  Eyes:     General: No scleral icterus.       Right eye: No discharge.        Left eye: No discharge.     Conjunctiva/sclera: Conjunctivae normal.  Cardiovascular:     Rate and Rhythm: Normal rate and regular rhythm.  Pulmonary:     Effort: Pulmonary effort is normal. No respiratory distress.     Breath sounds: Normal breath sounds.  Abdominal:     General: Bowel sounds are normal.     Palpations: Abdomen is soft.     Tenderness: There is no abdominal tenderness.  Musculoskeletal:        General: No swelling or tenderness.     Cervical back: Neck supple. No tenderness.  Lymphadenopathy:     Cervical: No cervical adenopathy.  Skin:    Findings: No erythema or rash.  Neurological:     Mental Status: He is alert.  Psychiatric:        Mood and Affect: Mood normal.        Behavior: Behavior normal.         Outpatient Encounter Medications as of 09/18/2024  Medication Sig   atorvastatin  (LIPITOR) 40 MG tablet TAKE 1 TABLET BY MOUTH EVERY DAY   bisacodyl (DULCOLAX) 5 MG EC tablet Take 5 mg by mouth daily as needed for moderate constipation.   Insulin   Pen Needle (PEN NEEDLES) 32G X 4 MM MISC Use to inject medication daily   metaxalone (SKELAXIN) 800 MG tablet Take 800 mg by mouth 3 (three) times daily.   mupirocin  ointment (BACTROBAN ) 2 % Apply 1 Application topically 2 (two) times daily.   oxyCODONE  (ROXICODONE ) 15 MG immediate release tablet Take 15 mg by mouth every 4 (four) hours.   sildenafil  (REVATIO ) 20 MG tablet Take 1-5 tablets 1 hour prior to intercourse as needed   allopurinol  (ZYLOPRIM ) 300 MG tablet Take 1 tablet (300 mg total) by mouth daily.   Continuous Glucose Sensor (FREESTYLE LIBRE 3 PLUS SENSOR) MISC Use to check blood glucose continuously. Change sensor every 15 days.   Fe Fum-FePoly-Vit C-Vit B3 (INTEGRA) 62.5-62.5-40-3 MG CAPS One capsule daily (Patient not taking: Reported on 09/18/2024)   insulin  degludec (TRESIBA  FLEXTOUCH) 200 UNIT/ML FlexTouch Pen Inject 32 Units into the skin daily.   metFORMIN  (GLUCOPHAGE -XR) 500 MG 24 hr tablet Take 1 tablet (500 mg total) by mouth daily with breakfast.   metoprolol  succinate (TOPROL -XL) 50 MG 24 hr tablet TAKE 2 TABLETS BY MOUTH DAILY WITH OR IMMEDIATELY FOLLOWING A MEAL.   triamterene -hydrochlorothiazide (MAXZIDE-25) 37.5-25 MG tablet Take 1 tablet by mouth daily.   [DISCONTINUED] allopurinol  (ZYLOPRIM ) 300 MG tablet TAKE 1 TABLET BY MOUTH EVERY DAY   [DISCONTINUED] cephALEXin  (KEFLEX ) 500 MG capsule Take 1 capsule (500 mg total) by mouth 4 (four) times daily.   [DISCONTINUED] Continuous Glucose Sensor (FREESTYLE LIBRE 3 PLUS SENSOR) MISC Use to check blood glucose continuously. Change sensor every 15 days.   [DISCONTINUED] insulin  degludec (TRESIBA  FLEXTOUCH) 200 UNIT/ML FlexTouch Pen Inject 24 Units into the skin daily.   [DISCONTINUED] metFORMIN  (GLUCOPHAGE -XR) 500 MG 24 hr tablet Take 1 tablet (500 mg total) by mouth daily with breakfast.   [DISCONTINUED] metoprolol  succinate (TOPROL -XL)  50 MG 24 hr tablet TAKE 2 TABLETS BY MOUTH DAILY WITH OR IMMEDIATELY FOLLOWING A MEAL.    [DISCONTINUED] triamterene -hydrochlorothiazide (MAXZIDE-25) 37.5-25 MG tablet TAKE 1 TABLET BY MOUTH EVERY DAY   No facility-administered encounter medications on file as of 09/18/2024.     Lab Results  Component Value Date   WBC 7.1 03/12/2024   HGB 12.8 (L) 03/12/2024   HCT 39.8 03/12/2024   PLT 129.0 (L) 03/12/2024   GLUCOSE 279 (H) 09/13/2024   CHOL 130 09/13/2024   TRIG 198.0 (H) 09/13/2024   HDL 44.90 09/13/2024   LDLDIRECT 53.0 09/08/2022   LDLCALC 46 09/13/2024   ALT 25 09/13/2024   AST 31 09/13/2024   NA 133 (L) 09/13/2024   K 4.1 09/13/2024   CL 98 09/13/2024   CREATININE 0.86 09/13/2024   BUN 12 09/13/2024   CO2 26 09/13/2024   TSH 2.87 06/17/2023   PSA 0.41 06/17/2023   INR 1.2 (H) 03/12/2024   HGBA1C 11.8 (H) 09/13/2024   MICROALBUR <0.7 09/18/2024    US  Abdomen Limited RUQ (LIVER/GB) Result Date: 05/22/2024 CLINICAL DATA:  screen for [facility] liver cirrhosis EXAM: ULTRASOUND ABDOMEN LIMITED RIGHT UPPER QUADRANT COMPARISON:  December 20, 2022, May 19, 2022 FINDINGS: Gallbladder: Surgically absent Common bile duct: Diameter: Visualized portion measures 2 mm, within normal limits. Liver: Benign cyst is noted measuring 12 mm. Heterogeneous, coarsened, and overall increased parenchymal echogenicity with poor acoustic penetration. Nodular liver contour. Portal vein is patent on color Doppler imaging with normal direction of blood flow towards the liver. Other: None. IMPRESSION: Cirrhotic liver morphology. No focal suspicious hepatic lesion is identified. Electronically Signed   By: Corean Salter M.D.   On: 05/22/2024 12:23       Assessment & Plan:  Cirrhosis of liver without ascites, unspecified hepatic cirrhosis type [facility]) Assessment & Plan: Saw GI 05/15/24 - recommended right upper quadrant ultrasound and EGD/colonoscopy. Recommended lactulose . Have discussed the need to take lactulose . Abdominal ultrasound revealed no liver mass. Recommended f/u ultrasound in 6 months.  Continue f/u with GI. Follow liver function tests.    Type 2 diabetes mellitus with neurological complications [facility]) Assessment & Plan: He is taking tresiba . Taking 28 units daily. Never started the short acting insulin . Discussed today. Not checking sugars. Not watching diet. Discussed importance of getting sugars under control. Have discussed CGM and placed sensor on several occasions. Will try again. Rx sent in for [facility] 3+. Was Referred to pharmacy team for pt assistance and help managing medications. Has not followed through with scheduling an appt. Also referred to endocrinology - given poorly controlled diabetes. Has not scheduled an appt. Low carb diet and exercise. Follow met b and A1c. Can titrate insulin  - record sugars. Increase to 32 units.   Orders: -     FreeStyle Libre 3 Plus Sensor; Use to check blood glucose continuously. Change sensor every 15 days.  Dispense: 2 each; Refill: 5 -     Tresiba  FlexTouch; Inject 32 Units into the skin daily.  Dispense: 15 mL; Refill: 2 -     Hemoglobin A1c; Future -     Microalbumin / creatinine urine ratio  Primary hypertension Assessment & Plan: Blood pressure doing well.  Continue triam/hctz and metoprolol .  Continue to follow pressures. Check metabolic panel today.   Orders: -     Basic metabolic panel with GFR; Future  Hypercholesterolemia Assessment & Plan: Was on lipitor. Low cholesterol diet and exercise.  Follow lipid panel and liver function tests.  Statin  stopped while pursuing liver w/up. Remain off for now. Check lipid panel.   Orders: -     Hepatic function panel; Future -     Lipid panel; Future  Anemia, unspecified type Assessment & Plan: Had colonoscopy 02/2022.  Multiple polyps.  Recommended f/u in 6-8 months.  (Dr Therisa).  F/u colonoscopy 11/24/22 - normal.  Recommended f/u colonoscopy in one year.  Not taking iron. Recheck cbc and iron studies. Scheduled for f/u colonoscopy next week. Discussed.   Orders: -     IBC +  Ferritin; Future -     CBC with Differential/Platelet; Future  Thrombocytopenia Assessment & Plan: Check cbc today. Felt to be related to underlying liver issues.   Pain in left testicle Assessment & Plan: Needs f/u with urology. Saw urology. Had scrotal ultrasound - No evidence of testicular torsion. Moderate left hydrocele. Bilateral testicular microlithiasis. Ill-defined heterogeneous area in the anterior left scrotal wall measuring 7 x 4 x 4 mm, indeterminate. Recommend correlation with physical exam. Treated with doxycycline .    Gastroesophageal reflux disease, unspecified whether esophagitis present Assessment & Plan: Continue protonix .    Constipation, unspecified constipation type Assessment & Plan: Takes narcotic pain medication. Have discussed bowel regimen - to keep bowels moving.  Saw GI.  Colonoscopy 03/11/22 - Four 2 to 3 mm polyps in the cecum, removed with a cold biopsy forceps. Resected and retrieved. Two 4 to 6 mm polyps in the cecum, removed with a cold snare. Resected and retrieved. Three 5 to 7 mm polyps in the ascending colon, removed with a cold snare. Resected and retrieved. One 15 mm polyp in the ascending colon, removed with mucosal resection. Resected and retrieved. Clip was placed. The examination was otherwise normal on direct and retroflexion views. Mucosal resection was performed. Resection and retrieval were complete. Continue f/u with GI.  Recommended f/u colonoscopy 6-8 months. Colonoscopy 11/2022 - normal.  Recommended f/u in one year. Seeing Dr Therisa.  Scheduled for f/u colonoscopy next week.  Discussed.    Other orders -     Allopurinol ; Take 1 tablet (300 mg total) by mouth daily.  Dispense: 90 tablet; Refill: 1 -     metFORMIN  HCl ER; Take 1 tablet (500 mg total) by mouth daily with breakfast.  Dispense: 90 tablet; Refill: 2 -     Metoprolol  Succinate ER; TAKE 2 TABLETS BY MOUTH DAILY WITH OR IMMEDIATELY FOLLOWING A MEAL.  Dispense: 180 tablet; Refill:  1 -     Triamterene -HCTZ; Take 1 tablet by mouth daily.  Dispense: 90 tablet; Refill: 1   I spent 45 minutes with the patient. Specifically time spent discussed his diabetes, liver issues and compliance issues. Time also spent discussing further w/up, evaluation and treatment.    Allena Hamilton, MD

## 2024-09-19 ENCOUNTER — Ambulatory Visit: Payer: Self-pay | Admitting: Internal Medicine

## 2024-09-19 NOTE — Telephone Encounter (Signed)
 Received notification that tresiba  was not covered. Need to clarify what is covered - need the preferred options.

## 2024-09-21 NOTE — Telephone Encounter (Signed)
 Cameron Sanchez is covered per Pharmacy. I have pended it but not completed the directions

## 2024-09-23 ENCOUNTER — Encounter: Payer: Self-pay | Admitting: Internal Medicine

## 2024-09-23 MED ORDER — BASAGLAR KWIKPEN 100 UNIT/ML ~~LOC~~ SOPN: 32.0000 [IU] | PEN_INJECTOR | Freq: Every day | SUBCUTANEOUS | 2 refills | Status: AC

## 2024-09-23 NOTE — Assessment & Plan Note (Signed)
 Saw GI 05/15/24 - recommended right upper quadrant ultrasound and EGD/colonoscopy. Recommended lactulose . Have discussed the need to take lactulose . Abdominal ultrasound revealed no liver mass. Recommended f/u ultrasound in 6 months. Continue f/u with GI. Follow liver function tests.

## 2024-09-23 NOTE — Assessment & Plan Note (Signed)
 Blood pressure doing well.  Continue triam/hctz and metoprolol .  Continue to follow pressures. Check metabolic panel today.

## 2024-09-23 NOTE — Assessment & Plan Note (Signed)
 Was on lipitor. Low cholesterol diet and exercise.  Follow lipid panel and liver function tests.  Statin stopped while pursuing liver w/up. Remain off for now. Check lipid panel.

## 2024-09-23 NOTE — Assessment & Plan Note (Signed)
 Check cbc today. Felt to be related to underlying liver issues.

## 2024-09-23 NOTE — Assessment & Plan Note (Signed)
 Continue protonix

## 2024-09-23 NOTE — Assessment & Plan Note (Signed)
 Had colonoscopy 02/2022.  Multiple polyps.  Recommended f/u in 6-8 months.  (Dr Therisa).  F/u colonoscopy 11/24/22 - normal.  Recommended f/u colonoscopy in one year.  Not taking iron. Recheck cbc and iron studies. Scheduled for f/u colonoscopy next week. Discussed.

## 2024-09-23 NOTE — Assessment & Plan Note (Signed)
 Needs f/u with urology. Saw urology. Had scrotal ultrasound - No evidence of testicular torsion. Moderate left hydrocele. Bilateral testicular microlithiasis. Ill-defined heterogeneous area in the anterior left scrotal wall measuring 7 x 4 x 4 mm, indeterminate. Recommend correlation with physical exam. Treated with doxycycline .

## 2024-09-23 NOTE — Telephone Encounter (Signed)
 Rx sent in for basaglar.

## 2024-09-23 NOTE — Assessment & Plan Note (Signed)
 Takes narcotic pain medication. Have discussed bowel regimen - to keep bowels moving.  Saw GI.  Colonoscopy 03/11/22 - Four 2 to 3 mm polyps in the cecum, removed with a cold biopsy forceps. Resected and retrieved. Two 4 to 6 mm polyps in the cecum, removed with a cold snare. Resected and retrieved. Three 5 to 7 mm polyps in the ascending colon, removed with a cold snare. Resected and retrieved. One 15 mm polyp in the ascending colon, removed with mucosal resection. Resected and retrieved. Clip was placed. The examination was otherwise normal on direct and retroflexion views. Mucosal resection was performed. Resection and retrieval were complete. Continue f/u with GI.  Recommended f/u colonoscopy 6-8 months. Colonoscopy 11/2022 - normal.  Recommended f/u in one year. Seeing Dr Therisa.  Scheduled for f/u colonoscopy next week.  Discussed.

## 2024-09-23 NOTE — Assessment & Plan Note (Addendum)
 He is taking tresiba . Taking 28 units daily. Never started the short acting insulin . Discussed today. Not checking sugars. Not watching diet. Discussed importance of getting sugars under control. Have discussed CGM and placed sensor on several occasions. Will try again. Rx sent in for [facility] 3+. Was Referred to pharmacy team for pt assistance and help managing medications. Has not followed through with scheduling an appt. Also referred to endocrinology - given poorly controlled diabetes. Has not scheduled an appt. Low carb diet and exercise. Follow met b and A1c. Can titrate insulin  - record sugars. Increase to 32 units.

## 2024-09-24 ENCOUNTER — Encounter: Payer: Self-pay | Admitting: Pharmacist

## 2024-09-24 NOTE — Progress Notes (Signed)
 Pharmacy Quality Measure Review  This patient is appearing on a report for being at risk of failing the Kidney Health Evaluation for Patients with Diabetes measure this calendar year.   Insurance report not up to date.  Since, UACR has been ordered, collected.   Lab Results  Component Value Date   MICRALBCREAT Unable to calculate 09/18/2024

## 2024-09-25 DIAGNOSIS — F1199 Opioid use, unspecified with unspecified opioid-induced disorder: Secondary | ICD-10-CM | POA: Diagnosis not present

## 2024-10-02 ENCOUNTER — Ambulatory Visit

## 2024-10-02 DIAGNOSIS — F1199 Opioid use, unspecified with unspecified opioid-induced disorder: Secondary | ICD-10-CM | POA: Diagnosis not present

## 2024-10-04 NOTE — Progress Notes (Signed)
 TOWNES FUHS                                          MRN: [id number]   10/04/2024   The [facility] Quality Team Specialist reviewed this patient medical record for the purposes of chart review for care gap closure. The following were reviewed: chart review for care gap closure-glycemic status assessment.GSD 11.8    [facility]I Quality Team

## 2024-10-04 NOTE — Progress Notes (Signed)
 RY MOODY                                          MRN: [id number]   10/04/2024   The [facility] Quality Team Specialist reviewed this patient medical record for the purposes of chart review for care gap closure. The following were reviewed: abstraction for care gap closure-kidney health evaluation for diabetes:eGFR  and uACR.    [facility] Quality Team

## 2024-10-05 ENCOUNTER — Ambulatory Visit (INDEPENDENT_AMBULATORY_CARE_PROVIDER_SITE_OTHER): Admitting: *Deleted

## 2024-10-05 ENCOUNTER — Telehealth: Payer: Self-pay | Admitting: *Deleted

## 2024-10-05 VITALS — Ht 68.0 in | Wt 222.0 lb

## 2024-10-05 DIAGNOSIS — Z0000 Encounter for general adult medical examination without abnormal findings: Secondary | ICD-10-CM | POA: Diagnosis not present

## 2024-10-05 DIAGNOSIS — E1149 Type 2 diabetes mellitus with other diabetic neurological complication: Secondary | ICD-10-CM

## 2024-10-05 NOTE — Telephone Encounter (Signed)
 Performed AWV Patient stated when he was last in for an office visit he was asked about a referral for diabetic management. Patient stated that he has thought about it and thinks that it will be a good idea. Patient stated that he is okay for a referral for diabetic management.

## 2024-10-05 NOTE — Patient Instructions (Addendum)
 Mr. Carrier,  Thank you for taking the time for your Medicare Wellness Visit. I appreciate your continued commitment to your health goals. Please review the care plan we discussed, and feel free to reach out if I can assist you further.  Please note that Annual Wellness Visits do not include a physical exam. Some assessments may be limited, especially if the visit was conducted virtually. If needed, we may recommend an in-person follow-up with your provider.  Ongoing Care Seeing your primary care provider every 3 to 6 months helps us  monitor your health and provide consistent, personalized care. Remember to call and schedule a diabetic eye exam. Consider updating your vaccines and keep your appointment scheduled for a colonoscopy.  Referrals If a referral was made during today's visit and you haven't received any updates within two weeks, please contact the referred provider directly to check on the status.  Recommended Screenings:  Health Maintenance  Topic Date Due   Pneumococcal Vaccine for age over 61 (1 of 2 - PCV) Never done   Zoster (Shingles) Vaccine (1 of 2) Never done   Hepatitis B Vaccine (2 of 2 - CpG 2-dose series) 03/03/2023   Colon Cancer Screening  11/25/2023   Eye exam for diabetics  06/15/2024   COVID-19 Vaccine (5 - 2025-26 season) 07/16/2024   Flu Shot  02/12/2025*   Complete foot exam   01/01/2025   Hemoglobin A1C  03/14/2025   Yearly kidney function blood test for diabetes  09/13/2025   Yearly kidney health urinalysis for diabetes  09/18/2025   Medicare Annual Wellness Visit  10/05/2025   DTaP/Tdap/Td vaccine (2 - Td or Tdap) 11/29/2030   Hepatitis C Screening  Completed   HIV Screening  Completed   HPV Vaccine  Aged Out   Meningitis B Vaccine  Aged Out  *Topic was postponed. The date shown is not the original due date.       10/05/2024   11:35 AM  Advanced Directives  Does Patient Have a Medical Advance Directive? No  Would patient like information on  creating a medical advance directive? No - Patient declined    Vision: Annual vision screenings are recommended for early detection of glaucoma, cataracts, and diabetic retinopathy. These exams can also reveal signs of chronic conditions such as diabetes and high blood pressure.  Dental: Annual dental screenings help detect early signs of oral cancer, gum disease, and other conditions linked to overall health, including heart disease and diabetes.  Please see the attached documents for additional preventive care recommendations.  Managing Pain Without Opioids Opioids are strong medicines used to treat moderate to severe pain. For some people, especially those who have long-term (chronic) pain, opioids may not be the best choice for pain management due to: Side effects like nausea, constipation, and sleepiness. The risk of addiction (opioid use disorder). The longer you take opioids, the greater your risk of addiction. Pain that lasts for more than 3 months is called chronic pain. Managing chronic pain usually requires more than one approach and is often provided by a team of health care providers working together (multidisciplinary approach). Pain management may be done at a pain management center or pain clinic. How to manage pain without the use of opioids Use non-opioid medicines Non-opioid medicines for pain may include: Over-the-counter or prescription non-steroidal anti-inflammatory drugs (NSAIDs). These may be the first medicines used for pain. They work well for muscle and bone pain, and they reduce swelling. Acetaminophen . This over-the-counter medicine may work well for  milder pain but not swelling. Antidepressants. These may be used to treat chronic pain. A certain type of antidepressant (tricyclics) is often used. These medicines are given in lower doses for pain than when used for depression. Anticonvulsants. These are usually used to treat seizures but may also reduce nerve  (neuropathic) pain. Muscle relaxants. These relieve pain caused by sudden muscle tightening (spasms). You may also use a pain medicine that is applied to the skin as a patch, cream, or gel (topical analgesic), such as a numbing medicine. These may cause fewer side effects than medicines taken by mouth. Do certain therapies as directed Some therapies can help with pain management. They include: Physical therapy. You will do exercises to gain strength and flexibility. A physical therapist may teach you exercises to move and stretch parts of your body that are weak, stiff, or painful. You can learn these exercises at physical therapy visits and practice them at home. Physical therapy may also involve: Massage. Heat wraps or applying heat or cold to affected areas. Electrical signals that interrupt pain signals (transcutaneous electrical nerve stimulation, TENS). Weak lasers that reduce pain and swelling (low-level laser therapy). Signals from your body that help you learn to regulate pain (biofeedback). Occupational therapy. This helps you to learn ways to function at home and work with less pain. Recreational therapy. This involves trying new activities or hobbies, such as a physical activity or drawing. Mental health therapy, including: Cognitive behavioral therapy (CBT). This helps you learn coping skills for dealing with pain. Acceptance and commitment therapy (ACT) to change the way you think and react to pain. Relaxation therapies, including muscle relaxation exercises and mindfulness-based stress reduction. Pain management counseling. This may be individual, family, or group counseling.  Receive medical treatments Medical treatments for pain management include: Nerve block injections. These may include a pain blocker and anti-inflammatory medicines. You may have injections: Near the spine to relieve chronic back or neck pain. Into joints to relieve back or joint pain. Into nerve areas  that supply a painful area to relieve body pain. Into muscles (trigger point injections) to relieve some painful muscle conditions. A medical device placed near your spine to help block pain signals and relieve nerve pain or chronic back pain (spinal cord stimulation device). Acupuncture. Follow these instructions at home Medicines Take over-the-counter and prescription medicines only as told by your health care provider. If you are taking pain medicine, ask your health care providers about possible side effects to watch out for. Do not drive or use heavy machinery while taking prescription opioid pain medicine. Lifestyle  Do not use drugs or alcohol to reduce pain. If you drink alcohol, limit how much you have to: 0-1 drink a day for women who are not pregnant. 0-2 drinks a day for men. Know how much alcohol is in a drink. In the [place] one drink equals one 12 oz bottle of beer (355 mL), one 5 oz glass of wine (148 mL), or one 1 oz glass of hard liquor (44 mL). Do not use any products that contain nicotine or tobacco. These products include cigarettes, chewing tobacco, and vaping devices, such as e-cigarettes. If you need help quitting, ask your health care provider. Eat a healthy diet and maintain a healthy weight. Poor diet and excess weight may make pain worse. Eat foods that are high in fiber. These include fresh fruits and vegetables, whole grains, and beans. Limit foods that are high in fat and processed sugars, such as fried and sweet  foods. Exercise regularly. Exercise lowers stress and may help relieve pain. Ask your health care provider what activities and exercises are safe for you. If your health care provider approves, join an exercise class that combines movement and stress reduction. Examples include yoga and tai chi. Get enough sleep. Lack of sleep may make pain worse. Lower stress as much as possible. Practice stress reduction techniques as told by your therapist. General  instructions Work with all your pain management providers to find the treatments that work best for you. You are an important member of your pain management team. There are many things you can do to reduce pain on your own. Consider joining an online or in-person support group for people who have chronic pain. Keep all follow-up visits. This is important. Where to find more information You can find more information about managing pain without opioids from: American Academy of Pain Medicine: painmed.org Institute for Chronic Pain: instituteforchronicpain.org American Chronic Pain Association: theacpa.org Contact a health care provider if: You have side effects from pain medicine. Your pain gets worse or does not get better with treatments or home therapy. You are struggling with anxiety or depression. Summary Many types of pain can be managed without opioids. Chronic pain may respond better to pain management without opioids. Pain is best managed when you and a team of health care providers work together. Pain management without opioids may include non-opioid medicines, medical treatments, physical therapy, mental health therapy, and lifestyle changes. Tell your health care providers if your pain gets worse or is not being managed well enough. This information is not intended to replace advice given to you by your health care provider. Make sure you discuss any questions you have with your health care provider. Document Revised: 02/11/2021 Document Reviewed: 02/11/2021 Elsevier Patient Education  2024 [employer].

## 2024-10-05 NOTE — Progress Notes (Signed)
 Chief Complaint  Patient presents with   Medicare Wellness     Subjective:   Cameron Sanchez is a 53 y.o. male who presents for a Medicare Annual Wellness Visit.  Allergies (verified) Patient has no known allergies.   History: Past Medical History:  Diagnosis Date   Allergy    Anemia    Aortic atherosclerosis    Chronic back pain    Chronic back pain    Cirrhosis of liver (HCC)    Complication of anesthesia    hard time to wake up x1   COVID-19    DM (diabetes mellitus), type 2 (HCC)    GERD (gastroesophageal reflux disease)    Gout    Hypercholesterolemia    Hypertension    Liver lesion    Migraines    Obesity    Sleep apnea    Subarachnoid hemorrhage (HCC) 11/2020   no surgery   Thrombocytopenia    TIA (transient ischemic attack)    Past Surgical History:  Procedure Laterality Date   BACK SURGERY  08/12/2014   multiple   BOTOX  INJECTION N/A 11/04/2022   Procedure: BOTOX  INJECTION;  Surgeon: Jordis Laneta FALCON, MD;  Location: [facility] ORS;  Service: General;  Laterality: N/A;   CHOLECYSTECTOMY     COLONOSCOPY WITH PROPOFOL  N/A 03/11/2022   Procedure: COLONOSCOPY WITH PROPOFOL ;  Surgeon: Therisa Bi, MD;  Location: [facility] ENDOSCOPY;  Service: Gastroenterology;  Laterality: N/A;   COLONOSCOPY WITH PROPOFOL  N/A 11/17/2022   Procedure: COLONOSCOPY WITH PROPOFOL ;  Surgeon: Therisa Bi, MD;  Location: [facility] ENDOSCOPY;  Service: Gastroenterology;  Laterality: N/A;   COLONOSCOPY WITH PROPOFOL  N/A 11/24/2022   Procedure: COLONOSCOPY WITH PROPOFOL ;  Surgeon: Therisa Bi, MD;  Location: [facility] ENDOSCOPY;  Service: Gastroenterology;  Laterality: N/A;   ESOPHAGOGASTRODUODENOSCOPY N/A 03/11/2022   Procedure: ESOPHAGOGASTRODUODENOSCOPY (EGD);  Surgeon: Therisa Bi, MD;  Location: [facility] ENDOSCOPY;  Service: Gastroenterology;  Laterality: N/A;   KNEE ARTHROSCOPY Left    Dr CLIFTON   SPHINCTEROTOMY N/A 11/04/2022   Procedure: SPHINCTEROTOMY;  Surgeon: Jordis Laneta FALCON, MD;  Location: [facility] ORS;   Service: General;  Laterality: N/A;   Family History  Problem Relation Age of Onset   Cancer Father        germ cell (retroperitoneal)   Coronary artery disease Other        s/p stent (age 55)   Hypertension Paternal Grandfather    Kidney cancer Paternal Grandfather    Diabetes Paternal Grandfather    Diabetes Maternal Grandfather    Heart disease Cousin        MI - age 26   Social History   Occupational History   Occupation: disabled    Employer: [employer]  Tobacco Use   Smoking status: Never   Smokeless tobacco: Never  Vaping Use   Vaping status: Never Used  Substance and Sexual Activity   Alcohol use: No    Alcohol/week: 0.0 standard drinks of alcohol   Drug use: Never   Sexual activity: Not on file   Tobacco Counseling Counseling given: Not Answered  SDOH Screenings   Food Insecurity: No Food Insecurity (10/05/2024)  Housing: Low Risk  (10/05/2024)  Transportation Needs: No Transportation Needs (10/05/2024)  Utilities: Not At Risk (10/05/2024)  Alcohol Screen: Low Risk  (10/05/2024)  Depression (PHQ2-9): Low Risk  (10/05/2024)  Financial Resource Strain: Low Risk  (10/05/2024)  Physical Activity: Inactive (10/05/2024)  Social Connections: Moderately Isolated (10/05/2024)  Stress: No Stress Concern Present (10/05/2024)  Tobacco Use: Low Risk  (  10/05/2024)  Health Literacy: Adequate Health Literacy (10/05/2024)   See flowsheets for full screening details  Depression Screen PHQ 2 & 9 Depression Scale- Over the past 2 weeks, how often have you been bothered by any of the following problems? Little interest or pleasure in doing things: 0 Feeling down, depressed, or hopeless (PHQ Adolescent also includes...irritable): 0 PHQ-2 Total Score: 0 Trouble falling or staying asleep, or sleeping too much: 1 Feeling tired or having little energy: 1 Poor appetite or overeating (PHQ Adolescent also includes...weight loss): 0 Feeling bad about yourself - or  that you are a failure or have let yourself or your family down: 0 Trouble concentrating on things, such as reading the newspaper or watching television (PHQ Adolescent also includes...like school work): 0 Moving or speaking so slowly that other people could have noticed. Or the opposite - being so fidgety or restless that you have been moving around a lot more than usual: 0 Thoughts that you would be better off dead, or of hurting yourself in some way: 0 PHQ-9 Total Score: 2 If you checked off any problems, how difficult have these problems made it for you to do your work, take care of things at home, or get along with other people?: Somewhat difficult     Goals Addressed             This Visit's Progress    Patient Stated       Wants to try to get off of some of his medications and lose some weight       Visit info / Clinical Intake: Medicare Wellness Visit Type:: Subsequent Annual Wellness Visit Persons participating in visit:: patient Medicare Wellness Visit Mode:: Telephone If telephone:: video declined Because this visit was a virtual/telehealth visit:: pt reported vitals If Telephone or Video please confirm:: I connected with the patient using audio enabled telemedicine application and verified that I am speaking with the correct person using two identifiers; I discussed the limitations of evaluation and management by telemedicine; The patient expressed understanding and agreed to proceed Patient Location:: Home Provider Location:: Office/Home Information given by:: patient Interpreter Needed?: No Pre-visit prep was completed: yes AWV questionnaire completed by patient prior to visit?: no Living arrangements:: lives with spouse/significant other Patient's Overall Health Status Rating: (!) fair Typical amount of pain: (!) a lot Does pain affect daily life?: (!) yes Are you currently prescribed opioids?: (!) yes  Dietary Habits and Nutritional Risks How many meals a  day?: 2 Eats fruit and vegetables daily?: (!) no Most meals are obtained by: preparing own meals In the last 2 weeks, have you had any of the following?: none Diabetic:: (!) yes Any non-healing wounds?: no How often do you check your BS?: continuous glucose monitor Would you like to be referred to a Nutritionist or for Diabetic Management? : yes (Note sent to provider)  Functional Status Activities of Daily Living (to include ambulation/medication): Independent Ambulation: Independent with device- listed below Home Assistive Devices/Equipment: Cane (at times) Medication Administration: Independent Home Management: Independent Manage your own finances?: yes Primary transportation is: driving Concerns about vision?: (!) yes (visual changes) Concerns about hearing?: no  Fall Screening Falls in the past year?: 0 Number of falls in past year: 0 Was there an injury with Fall?: 0 Fall Risk Category Calculator: 0 Patient Fall Risk Level: Low Fall Risk  Fall Risk Patient at Risk for Falls Due to: No Fall Risks Fall risk Follow up: Falls evaluation completed; Falls prevention discussed  Home  and Transportation Safety: All rugs have non-skid backing?: yes All stairs or steps have railings?: (!) no Grab bars in the bathtub or shower?: (!) no Have non-skid surface in bathtub or shower?: (!) no Good home lighting?: yes Regular seat belt use?: yes Hospital stays in the last year:: no  Cognitive Assessment Difficulty concentrating, remembering, or making decisions? : no Will 6CIT or Mini Cog be Completed: yes What year is it?: 0 points What month is it?: 0 points Give patient an address phrase to remember (5 components): [address] [place], [place] About what time is it?: 0 points Count backwards from 20 to 1: 0 points Say the months of the year in reverse: 0 points Repeat the address phrase from earlier: 0 points 6 CIT Score: 0 points  Advance Directives (For Healthcare) Does  Patient Have a Medical Advance Directive?: No Would patient like information on creating a medical advance directive?: No - Patient declined  Reviewed/Updated  Reviewed/Updated: Reviewed All (Medical, Surgical, Family, Medications, Allergies, Care Teams, Patient Goals)        Objective:    Today's Vitals   10/05/24 1131  Weight: 222 lb (100.7 kg)  Height: 5' 8 (1.727 m)   Body mass index is 33.75 kg/m.  Current Medications (verified) Outpatient Encounter Medications as of 10/05/2024  Medication Sig   allopurinol  (ZYLOPRIM ) 300 MG tablet Take 1 tablet (300 mg total) by mouth daily.   atorvastatin  (LIPITOR) 40 MG tablet TAKE 1 TABLET BY MOUTH EVERY DAY   bisacodyl (DULCOLAX) 5 MG EC tablet Take 5 mg by mouth daily as needed for moderate constipation.   Continuous Glucose Sensor (FREESTYLE LIBRE 3 PLUS SENSOR) MISC Use to check blood glucose continuously. Change sensor every 15 days.   Insulin  Glargine (BASAGLAR  KWIKPEN) 100 UNIT/ML Inject 32 Units into the skin daily.   Insulin  Pen Needle (PEN NEEDLES) 32G X 4 MM MISC Use to inject medication daily   metaxalone (SKELAXIN) 800 MG tablet Take 800 mg by mouth 3 (three) times daily.   metFORMIN  (GLUCOPHAGE -XR) 500 MG 24 hr tablet Take 1 tablet (500 mg total) by mouth daily with breakfast.   metoprolol  succinate (TOPROL -XL) 50 MG 24 hr tablet TAKE 2 TABLETS BY MOUTH DAILY WITH OR IMMEDIATELY FOLLOWING A MEAL.   mupirocin  ointment (BACTROBAN ) 2 % Apply 1 Application topically 2 (two) times daily. (Patient taking differently: Apply 1 Application topically 2 (two) times daily as needed.)   oxyCODONE  (ROXICODONE ) 15 MG immediate release tablet Take 15 mg by mouth every 4 (four) hours.   sildenafil  (REVATIO ) 20 MG tablet Take 1-5 tablets 1 hour prior to intercourse as needed   triamterene -hydrochlorothiazide (MAXZIDE-25) 37.5-25 MG tablet Take 1 tablet by mouth daily.   Fe Fum-FePoly-Vit C-Vit B3 (INTEGRA) 62.5-62.5-40-3 MG CAPS One capsule  daily (Patient not taking: Reported on 10/05/2024)   No facility-administered encounter medications on file as of 10/05/2024.   Hearing/Vision screen Hearing Screening - Comments:: No issues Vision Screening - Comments:: Readers,  Eye, overdue, Will call and schedule a diabetic eye exam.  Immunizations and Health Maintenance Health Maintenance  Topic Date Due   Pneumococcal Vaccine: 50+ Years (1 of 2 - PCV) Never done   Zoster Vaccines- Shingrix (1 of 2) Never done   Hepatitis B Vaccines 19-59 Average Risk (2 of 2 - CpG 2-dose series) 03/03/2023   Colonoscopy  11/25/2023   OPHTHALMOLOGY EXAM  06/15/2024   COVID-19 Vaccine (5 - 2025-26 season) 07/16/2024   Influenza Vaccine  02/12/2025 (Originally 06/15/2024)  FOOT EXAM  01/01/2025   HEMOGLOBIN A1C  03/14/2025   Diabetic kidney evaluation - eGFR measurement  09/13/2025   Diabetic kidney evaluation - Urine ACR  09/18/2025   Medicare Annual Wellness (AWV)  10/05/2025   DTaP/Tdap/Td (2 - Td or Tdap) 11/29/2030   Hepatitis C Screening  Completed   HIV Screening  Completed   HPV VACCINES  Aged Out   Meningococcal B Vaccine  Aged Out        Assessment/Plan:  This is a routine wellness examination for Cameron Sanchez.  Patient Care Team: Glendia Shad, MD as PCP - General (Internal Medicine) Jacobo Evalene PARAS, MD as Consulting Physician (Oncology) Geronimo Manuelita SAUNDERS, [facility] (Pharmacist) Nancey Bruckner, MD as Referring Physician (Pain Medicine) Therisa Bi, MD as Consulting Physician (Gastroenterology)  I have personally reviewed and noted the following in the patient's chart:   Medical and social history Use of alcohol, tobacco or illicit drugs  Current medications and supplements including opioid prescriptions. Functional ability and status Nutritional status Physical activity Advanced directives List of other physicians Hospitalizations, surgeries, and ER visits in previous 12 months Vitals Screenings to include  cognitive, depression, and falls Referrals and appointments  No orders of the defined types were placed in this encounter.  In addition, I have reviewed and discussed with patient certain preventive protocols, quality metrics, and best practice recommendations. A written personalized care plan for preventive services as well as general preventive health recommendations were provided to patient.   Angeline Fredericks, LPN   [id number]   Return in 1 year (on 10/05/2025).  After Visit Summary: (MyChart) Due to this being a telephonic visit, the after visit summary with patients personalized plan was offered to patient via MyChart   Nurse Notes: Patient declines all vaccines. Patient stated that he is scheduled for a colonoscopy 11/20/2024. Patient stated that he will call and schedule a diabetic eye exam.

## 2024-10-06 NOTE — Telephone Encounter (Signed)
 Order placed for referral for diabetes education.

## 2024-10-09 DIAGNOSIS — F1199 Opioid use, unspecified with unspecified opioid-induced disorder: Secondary | ICD-10-CM | POA: Diagnosis not present

## 2024-10-12 DIAGNOSIS — G894 Chronic pain syndrome: Secondary | ICD-10-CM | POA: Diagnosis not present

## 2024-10-12 DIAGNOSIS — M5137 Other intervertebral disc degeneration, lumbosacral region with discogenic back pain only: Secondary | ICD-10-CM | POA: Diagnosis not present

## 2024-10-12 DIAGNOSIS — M961 Postlaminectomy syndrome, not elsewhere classified: Secondary | ICD-10-CM | POA: Diagnosis not present

## 2024-10-12 DIAGNOSIS — Z79891 Long term (current) use of opiate analgesic: Secondary | ICD-10-CM | POA: Diagnosis not present

## 2024-10-12 DIAGNOSIS — M545 Low back pain, unspecified: Secondary | ICD-10-CM | POA: Diagnosis not present

## 2024-10-31 ENCOUNTER — Encounter: Payer: Self-pay | Admitting: Internal Medicine

## 2024-10-31 ENCOUNTER — Ambulatory Visit: Admitting: Internal Medicine

## 2024-10-31 VITALS — BP 128/74 | HR 80 | Temp 98.3°F | Ht 68.0 in | Wt 223.4 lb

## 2024-10-31 DIAGNOSIS — E1149 Type 2 diabetes mellitus with other diabetic neurological complication: Secondary | ICD-10-CM

## 2024-10-31 DIAGNOSIS — M109 Gout, unspecified: Secondary | ICD-10-CM

## 2024-10-31 DIAGNOSIS — I10 Essential (primary) hypertension: Secondary | ICD-10-CM | POA: Diagnosis not present

## 2024-10-31 DIAGNOSIS — K7460 Unspecified cirrhosis of liver: Secondary | ICD-10-CM | POA: Diagnosis not present

## 2024-10-31 DIAGNOSIS — G8929 Other chronic pain: Secondary | ICD-10-CM

## 2024-10-31 DIAGNOSIS — K219 Gastro-esophageal reflux disease without esophagitis: Secondary | ICD-10-CM

## 2024-10-31 DIAGNOSIS — N50812 Left testicular pain: Secondary | ICD-10-CM

## 2024-10-31 DIAGNOSIS — E1169 Type 2 diabetes mellitus with other specified complication: Secondary | ICD-10-CM

## 2024-10-31 DIAGNOSIS — E7800 Pure hypercholesterolemia, unspecified: Secondary | ICD-10-CM

## 2024-10-31 DIAGNOSIS — M545 Low back pain, unspecified: Secondary | ICD-10-CM

## 2024-10-31 DIAGNOSIS — E611 Iron deficiency: Secondary | ICD-10-CM

## 2024-10-31 DIAGNOSIS — K5900 Constipation, unspecified: Secondary | ICD-10-CM

## 2024-10-31 DIAGNOSIS — D696 Thrombocytopenia, unspecified: Secondary | ICD-10-CM | POA: Diagnosis not present

## 2024-10-31 DIAGNOSIS — F439 Reaction to severe stress, unspecified: Secondary | ICD-10-CM

## 2024-10-31 DIAGNOSIS — K769 Liver disease, unspecified: Secondary | ICD-10-CM

## 2024-10-31 LAB — HM DIABETES FOOT EXAM

## 2024-10-31 NOTE — Progress Notes (Signed)
 "  Subjective:    [name], male    DOB: Dec 31, 1970, 53 y.o.   MRN: [id number]  Patient here for  Chief Complaint  Patient presents with   Medical Management of Chronic Issues    HPI Here for a scheduled follow up - follow up regarding diabetes, hypertension and hypercholesterolemia.  Saw GI 05/15/24 - recommended right upper quadrant ultrasound and EGD/colonoscopy. Recommended lactulose . Scheduled for colonoscopy  GI following - liver - ultrasound 05/22/24 - no liver mass. Repeat ultrasound in 6 months. Sugars have been poorly controlled. He has started to decrease his sweet tea intake. Has decreased bread intake. Reports he has colonoscopy scheduled 11/2024. Taking dulcolax. Bowels are some better. Still with constipation. Discussed f/u with urology - testicular discomfort. Brought in no sugar readings. Discussed using CGM.    Past Medical History:  Diagnosis Date   Allergy    Anemia    Aortic atherosclerosis    Chronic back pain    Chronic back pain    Cirrhosis of liver (HCC)    Complication of anesthesia    hard time to wake up x1   COVID-19    DM (diabetes mellitus), type 2 (HCC)    GERD (gastroesophageal reflux disease)    Gout    Hypercholesterolemia    Hypertension    Liver lesion    Migraines    Obesity    Sleep apnea    Subarachnoid hemorrhage (HCC) 11/2020   no surgery   Thrombocytopenia    TIA (transient ischemic attack)    Past Surgical History:  Procedure Laterality Date   BACK SURGERY  08/12/2014   multiple   BOTOX  INJECTION N/A 11/04/2022   Procedure: BOTOX  INJECTION;  Surgeon: Jordis Laneta FALCON, MD;  Location: [facility] ORS;  Service: General;  Laterality: N/A;   CHOLECYSTECTOMY     COLONOSCOPY WITH PROPOFOL  N/A 03/11/2022   Procedure: COLONOSCOPY WITH PROPOFOL ;  Surgeon: Therisa Bi, MD;  Location: [facility] ENDOSCOPY;  Service: Gastroenterology;  Laterality: N/A;   COLONOSCOPY WITH PROPOFOL  N/A 11/17/2022   Procedure: COLONOSCOPY WITH PROPOFOL ;   Surgeon: Therisa Bi, MD;  Location: [facility] ENDOSCOPY;  Service: Gastroenterology;  Laterality: N/A;   COLONOSCOPY WITH PROPOFOL  N/A 11/24/2022   Procedure: COLONOSCOPY WITH PROPOFOL ;  Surgeon: Therisa Bi, MD;  Location: [facility] ENDOSCOPY;  Service: Gastroenterology;  Laterality: N/A;   ESOPHAGOGASTRODUODENOSCOPY N/A 03/11/2022   Procedure: ESOPHAGOGASTRODUODENOSCOPY (EGD);  Surgeon: Therisa Bi, MD;  Location: [facility] ENDOSCOPY;  Service: Gastroenterology;  Laterality: N/A;   KNEE ARTHROSCOPY Left    Dr CLIFTON   SPHINCTEROTOMY N/A 11/04/2022   Procedure: SPHINCTEROTOMY;  Surgeon: Jordis Laneta FALCON, MD;  Location: [facility] ORS;  Service: General;  Laterality: N/A;   Family History  Problem Relation Age of Onset   Cancer Father        germ cell (retroperitoneal)   Coronary artery disease Other        s/p stent (age 55)   Hypertension Paternal Grandfather    Kidney cancer Paternal Grandfather    Diabetes Paternal Grandfather    Diabetes Maternal Grandfather    Heart disease Cousin        MI - age 40   Social History   Socioeconomic History   Marital status: Married    Spouse name: Not on file   Number of children: 2   Years of education: 12   Highest education level: Not on file  Occupational History   Occupation: disabled    Employer: [employer]  Tobacco Use   Smoking status: Never   Smokeless tobacco: Never  Vaping Use   Vaping status: Never Used  Substance and Sexual Activity   Alcohol use: No    Alcohol/week: 0.0 standard drinks of alcohol   Drug use: Never   Sexual activity: Not on file  Other Topics Concern   Not on file  Social History Narrative   Lives with girlfriend in a one story home.  Has 2 sons.  Trying to get disability due to several back issues.  Education: high school.    Social Drivers of Health   Tobacco Use: Low Risk (11/09/2024)   Patient History    Smoking Tobacco Use: Never    Smokeless Tobacco Use: Never    Passive Exposure: Not on file   Financial Resource Strain: Low Risk (10/05/2024)   Overall Financial Resource Strain (CARDIA)    Difficulty of Paying Living Expenses: Not hard at all  Food Insecurity: No Food Insecurity (10/05/2024)   Epic    Worried About Programme Researcher, Broadcasting/film/video in the Last Year: Never true    Ran Out of Food in the Last Year: Never true  Transportation Needs: No Transportation Needs (10/05/2024)   Epic    Lack of Transportation (Medical): No    Lack of Transportation (Non-Medical): No  Physical Activity: Inactive (10/05/2024)   Exercise Vital Sign    Days of Exercise per Week: 0 days    Minutes of Exercise per Session: 0 min  Stress: No Stress Concern Present (10/05/2024)   [employer] of Occupational Health - Occupational Stress Questionnaire    Feeling of Stress: Not at all  Social Connections: Moderately Isolated (10/05/2024)   Social Connection and Isolation Panel    Frequency of Communication with Friends and Family: More than three times a week    Frequency of Social Gatherings with Friends and Family: Never    Attends Religious Services: Never    Database Administrator or Organizations: No    Attends Banker Meetings: Never    Marital Status: Married  Depression (PHQ2-9): Low Risk (10/31/2024)   Depression (PHQ2-9)    PHQ-2 Score: 2  Alcohol Screen: Low Risk (10/05/2024)   Alcohol Screen    Last Alcohol Screening Score (AUDIT): 0  Housing: Low Risk (10/05/2024)   Epic    Unable to Pay for Housing in the Last Year: No    Number of Times Moved in the Last Year: 0    Homeless in the Last Year: No  Utilities: Not At Risk (10/05/2024)   Epic    Threatened with loss of utilities: No  Health Literacy: Adequate Health Literacy (10/05/2024)   B1300 Health Literacy    Frequency of need for help with medical instructions: Never     Review of Systems  Constitutional:  Negative for appetite change and unexpected weight change.  HENT:  Negative for congestion and sinus  pressure.   Respiratory:  Negative for cough, chest tightness and shortness of breath.   Cardiovascular:  Negative for chest pain, palpitations and leg swelling.  Gastrointestinal:  Positive for constipation. Negative for vomiting.  Genitourinary:  Negative for difficulty urinating and dysuria.  Musculoskeletal:  Positive for back pain.  Skin:  Negative for color change and rash.  Neurological:  Negative for dizziness and headaches.  Psychiatric/Behavioral:  Negative for agitation and dysphoric mood.        Objective:     BP 128/74   Pulse 80   Temp 98.3 F (  36.8 C) (Oral)   Ht 5' 8 (1.727 m)   Wt 223 lb 6.4 oz (101.3 kg)   SpO2 98%   BMI 33.97 kg/m  Wt Readings from Last 3 Encounters:  10/31/24 223 lb 6.4 oz (101.3 kg)  10/05/24 222 lb (100.7 kg)  09/18/24 224 lb (101.6 kg)    Physical Exam Vitals reviewed.  Constitutional:      General: He is not in acute distress.    Appearance: Normal appearance. He is well-developed.  HENT:     Head: Normocephalic and atraumatic.     Right Ear: External ear normal.     Left Ear: External ear normal.  Eyes:     General: No scleral icterus.       Right eye: No discharge.        Left eye: No discharge.     Conjunctiva/sclera: Conjunctivae normal.  Cardiovascular:     Rate and Rhythm: Normal rate and regular rhythm.  Pulmonary:     Effort: Pulmonary effort is normal. No respiratory distress.     Breath sounds: Normal breath sounds.  Abdominal:     General: Bowel sounds are normal.     Palpations: Abdomen is soft.     Tenderness: There is no abdominal tenderness.  Genitourinary:    Comments: Declined exam.  Musculoskeletal:        General: No swelling or tenderness.     Cervical back: Neck supple. No tenderness.  Lymphadenopathy:     Cervical: No cervical adenopathy.  Skin:    Findings: No erythema or rash.  Neurological:     Mental Status: He is alert.  Psychiatric:        Mood and Affect: Mood normal.         Behavior: Behavior normal.         Outpatient Encounter Medications as of 10/31/2024  Medication Sig   allopurinol  (ZYLOPRIM ) 300 MG tablet Take 1 tablet (300 mg total) by mouth daily.   atorvastatin  (LIPITOR) 40 MG tablet TAKE 1 TABLET BY MOUTH EVERY DAY   bisacodyl (DULCOLAX) 5 MG EC tablet Take 5 mg by mouth daily as needed for moderate constipation.   Continuous Glucose Sensor (FREESTYLE LIBRE 3 PLUS SENSOR) MISC Use to check blood glucose continuously. Change sensor every 15 days.   ENULOSE  10 GM/15ML SOLN    Insulin  Glargine (BASAGLAR  KWIKPEN) 100 UNIT/ML Inject 32 Units into the skin daily.   Insulin  Pen Needle (PEN NEEDLES) 32G X 4 MM MISC Use to inject medication daily   metaxalone (SKELAXIN) 800 MG tablet Take 800 mg by mouth 3 (three) times daily.   metFORMIN  (GLUCOPHAGE -XR) 500 MG 24 hr tablet Take 1 tablet (500 mg total) by mouth daily with breakfast.   metoprolol  succinate (TOPROL -XL) 50 MG 24 hr tablet TAKE 2 TABLETS BY MOUTH DAILY WITH OR IMMEDIATELY FOLLOWING A MEAL.   mupirocin  ointment (BACTROBAN ) 2 % Apply 1 Application topically 2 (two) times daily. (Patient taking differently: Apply 1 Application topically 2 (two) times daily as needed.)   oxyCODONE  (ROXICODONE ) 15 MG immediate release tablet Take 15 mg by mouth every 4 (four) hours.   pantoprazole  (PROTONIX ) 40 MG tablet Take 40 mg by mouth 2 (two) times daily.   sildenafil  (REVATIO ) 20 MG tablet Take 1-5 tablets 1 hour prior to intercourse as needed   triamterene -hydrochlorothiazide (MAXZIDE-25) 37.5-25 MG tablet Take 1 tablet by mouth daily.   [DISCONTINUED] diclofenac (VOLTAREN) 75 MG EC tablet  (Patient not taking: Reported on 10/31/2024)   [  DISCONTINUED] Fe Fum-FePoly-Vit C-Vit B3 (INTEGRA) 62.5-62.5-40-3 MG CAPS One capsule daily (Patient not taking: Reported on 10/31/2024)   [DISCONTINUED] Na Sulfate-K Sulfate-Mg Sulfate concentrate (SUPREP) 17.5-3.13-1.6 GM/177ML SOLN  (Patient not taking: Reported on  10/31/2024)   No facility-administered encounter medications on file as of 10/31/2024.     Lab Results  Component Value Date   WBC 7.1 03/12/2024   HGB 12.8 (L) 03/12/2024   HCT 39.8 03/12/2024   PLT 129.0 (L) 03/12/2024   GLUCOSE 279 (H) 09/13/2024   CHOL 130 09/13/2024   TRIG 198.0 (H) 09/13/2024   HDL 44.90 09/13/2024   LDLDIRECT 53.0 09/08/2022   LDLCALC 46 09/13/2024   ALT 25 09/13/2024   AST 31 09/13/2024   NA 133 (L) 09/13/2024   K 4.1 09/13/2024   CL 98 09/13/2024   CREATININE 0.86 09/13/2024   BUN 12 09/13/2024   CO2 26 09/13/2024   TSH 2.87 06/17/2023   PSA 0.41 06/17/2023   INR 1.2 (H) 03/12/2024   HGBA1C 11.8 (H) 09/13/2024   MICROALBUR <0.7 09/18/2024    US  Abdomen Limited RUQ (LIVER/GB) Result Date: 05/22/2024 CLINICAL DATA:  screen for [facility] liver cirrhosis EXAM: ULTRASOUND ABDOMEN LIMITED RIGHT UPPER QUADRANT COMPARISON:  December 20, 2022, May 19, 2022 FINDINGS: Gallbladder: Surgically absent Common bile duct: Diameter: Visualized portion measures 2 mm, within normal limits. Liver: Benign cyst is noted measuring 12 mm. Heterogeneous, coarsened, and overall increased parenchymal echogenicity with poor acoustic penetration. Nodular liver contour. Portal vein is patent on color Doppler imaging with normal direction of blood flow towards the liver. Other: None. IMPRESSION: Cirrhotic liver morphology. No focal suspicious hepatic lesion is identified. Electronically Signed   By: Corean Salter M.D.   On: 05/22/2024 12:23       Assessment & Plan:  Type 2 diabetes mellitus with neurological complications [facility]) Assessment & Plan: He is taking tresiba . Taking 24 units daily. Never started the short acting insulin . Discussed today. Not checking sugars. Discussed importance of getting sugars under control. Have discussed CGM and placed sensor on several occasions. Will try again. Rx has been sent in for [facility] 3+. Was Referred to pharmacy team for pt assistance and help  managing medications. Has not followed through with scheduling an appt. Also referred to endocrinology - given poorly controlled diabetes. Has not scheduled an appt. Low carb diet and exercise. He had adjusted his diet some. Has decreased bread. Has decreased intake of sweet tea. Follow met b and A1c. Can titrate insulin  - record sugars. Arrange f/u with pharmacy.   Orders: -     AMB Referral VBCI Care Management  Thrombocytopenia Assessment & Plan: Follow cbc. Felt to be related to underlying liver issues.    Stress Assessment & Plan: Increased stress/frustration related to his pain issues.  Seeing pain clinic. Follow.    Pain in left testicle Assessment & Plan: Needs f/u with urology. Saw urology. Had scrotal ultrasound - No evidence of testicular torsion. Moderate left hydrocele. Bilateral testicular microlithiasis. Ill-defined heterogeneous area in the anterior left scrotal wall measuring 7 x 4 x 4 mm, indeterminate. Recommend correlation with physical exam. Treated with doxycycline . Discussed the need for f/u with urology given persistent discomfort.    Liver lesion Assessment & Plan: Saw GI (Dr Therisa) - 01/2023 - recommended MRI of the liver in 07/05/2023 to screen for [facility].  Still has not followed up with GI.  Have discussed again today the need to follow up. Plans to f/u.    Iron deficiency Assessment &  Plan: Dr Therisa 01/2023 - recommended - Capsule study of the small bowel in view of iron deficiency. EGD to screen for esophageal varices in April 2026. Oral iron ferrous sulfate  325 mg 1 tablet every other day to be continued.  Colonoscopy 2025. Did not follow up.  Discussed again today the need to follow up. Has f/u colonoscopy planned 11/2024 (per pt).    Primary hypertension Assessment & Plan: Blood pressure as outlined.  Continue triam/hctz and metoprolol .  Continue to follow pressures. Follow metabolic panel.    Hypercholesterolemia Assessment & Plan: Lipitor. Low  cholesterol diet and exercise.  Follow lipid panel and liver function tests.  Needs to f/u with GI regarding liver w/up.    Gout, unspecified cause, unspecified chronicity, unspecified site Assessment & Plan: No reported flares since last visit.  Follow.  Continues allopurinol .    Gastroesophageal reflux disease, unspecified whether esophagitis present Assessment & Plan: Continue protonix .    Constipation, unspecified constipation type Assessment & Plan: Takes narcotic pain medication. Have discussed bowel regimen - to keep bowels moving.  Saw GI.  Colonoscopy 03/11/22 - Four 2 to 3 mm polyps in the cecum, removed with a cold biopsy forceps. Resected and retrieved. Two 4 to 6 mm polyps in the cecum, removed with a cold snare. Resected and retrieved. Three 5 to 7 mm polyps in the ascending colon, removed with a cold snare. Resected and retrieved. One 15 mm polyp in the ascending colon, removed with mucosal resection. Resected and retrieved. Clip was placed. The examination was otherwise normal on direct and retroflexion views. Mucosal resection was performed. Resection and retrieval were complete. Continue f/u with GI.  Recommended f/u colonoscopy 6-8 months. Colonoscopy 11/2022 - normal.  Recommended f/u in one year. Seeing Dr Therisa.  Scheduled for f/u colonoscopy in 11/2024 (per pt). Continue dulcolax. Avoid GLP 1 agonist given increased constipation.    Cirrhosis of liver without ascites, unspecified hepatic cirrhosis type [facility]) Assessment & Plan: Saw GI 05/15/24 - recommended right upper quadrant ultrasound and EGD/colonoscopy. Recommended lactulose . Have discussed the need to take lactulose . Abdominal ultrasound revealed no liver mass. Recommended f/u ultrasound in 6 months. Continue f/u with GI. Follow liver function tests. He has failed to f/u. Discussed today. Plans to f/u with GI.    Chronic low back pain, unspecified back pain laterality, unspecified whether sciatica present Assessment &  Plan: Followed by pain clinic.       Allena Hamilton, MD "

## 2024-11-01 NOTE — Progress Notes (Signed)
 ROZELL THEILER                                          MRN: [id number]   11/01/2024   The VBCI Quality Team Specialist reviewed this patient medical record for the purposes of chart review for care gap closure. The following were reviewed: chart review for care gap closure-glycemic status assessment.    VBCI Quality Team

## 2024-11-09 ENCOUNTER — Encounter: Payer: Self-pay | Admitting: Internal Medicine

## 2024-11-09 NOTE — Assessment & Plan Note (Signed)
 Follow cbc. Felt to be related to underlying liver issues.

## 2024-11-09 NOTE — Assessment & Plan Note (Signed)
 Blood pressure as outlined.  Continue triam/hctz and metoprolol .  Continue to follow pressures. Follow metabolic panel.

## 2024-11-09 NOTE — Assessment & Plan Note (Signed)
 Takes narcotic pain medication. Have discussed bowel regimen - to keep bowels moving.  Saw GI.  Colonoscopy 03/11/22 - Four 2 to 3 mm polyps in the cecum, removed with a cold biopsy forceps. Resected and retrieved. Two 4 to 6 mm polyps in the cecum, removed with a cold snare. Resected and retrieved. Three 5 to 7 mm polyps in the ascending colon, removed with a cold snare. Resected and retrieved. One 15 mm polyp in the ascending colon, removed with mucosal resection. Resected and retrieved. Clip was placed. The examination was otherwise normal on direct and retroflexion views. Mucosal resection was performed. Resection and retrieval were complete. Continue f/u with GI.  Recommended f/u colonoscopy 6-8 months. Colonoscopy 11/2022 - normal.  Recommended f/u in one year. Seeing Dr Therisa.  Scheduled for f/u colonoscopy in 11/2024 (per pt). Continue dulcolax. Avoid GLP 1 agonist given increased constipation.

## 2024-11-09 NOTE — Assessment & Plan Note (Signed)
 Lipitor. Low cholesterol diet and exercise.  Follow lipid panel and liver function tests.  Needs to f/u with GI regarding liver w/up.

## 2024-11-09 NOTE — Assessment & Plan Note (Signed)
 He is taking tresiba . Taking 24 units daily. Never started the short acting insulin . Discussed today. Not checking sugars. Discussed importance of getting sugars under control. Have discussed CGM and placed sensor on several occasions. Will try again. Rx has been sent in for [facility] 3+. Was Referred to pharmacy team for pt assistance and help managing medications. Has not followed through with scheduling an appt. Also referred to endocrinology - given poorly controlled diabetes. Has not scheduled an appt. Low carb diet and exercise. He had adjusted his diet some. Has decreased bread. Has decreased intake of sweet tea. Follow met b and A1c. Can titrate insulin  - record sugars. Arrange f/u with pharmacy.

## 2024-11-09 NOTE — Assessment & Plan Note (Signed)
 Saw GI 05/15/24 - recommended right upper quadrant ultrasound and EGD/colonoscopy. Recommended lactulose . Have discussed the need to take lactulose . Abdominal ultrasound revealed no liver mass. Recommended f/u ultrasound in 6 months. Continue f/u with GI. Follow liver function tests. He has failed to f/u. Discussed today. Plans to f/u with GI.

## 2024-11-09 NOTE — Assessment & Plan Note (Signed)
 Continue protonix

## 2024-11-09 NOTE — Assessment & Plan Note (Signed)
 Saw GI (Dr Therisa) - 01/2023 - recommended MRI of the liver in 07/05/2023 to screen for [facility].  Still has not followed up with GI.  Have discussed again today the need to follow up. Plans to f/u.

## 2024-11-09 NOTE — Assessment & Plan Note (Signed)
Followed by pain clinic.  

## 2024-11-09 NOTE — Assessment & Plan Note (Signed)
 Dr Therisa 01/2023 - recommended - Capsule study of the small bowel in view of iron deficiency. EGD to screen for esophageal varices in April 2026. Oral iron ferrous sulfate  325 mg 1 tablet every other day to be continued.  Colonoscopy 2025. Did not follow up.  Discussed again today the need to follow up. Has f/u colonoscopy planned 11/2024 (per pt).

## 2024-11-09 NOTE — Assessment & Plan Note (Addendum)
 Needs f/u with urology. Saw urology. Had scrotal ultrasound - No evidence of testicular torsion. Moderate left hydrocele. Bilateral testicular microlithiasis. Ill-defined heterogeneous area in the anterior left scrotal wall measuring 7 x 4 x 4 mm, indeterminate. Recommend correlation with physical exam. Treated with doxycycline . Discussed the need for f/u with urology given persistent discomfort.

## 2024-11-09 NOTE — Assessment & Plan Note (Signed)
 Increased stress/frustration related to his pain issues.  Seeing pain clinic. Follow.

## 2024-11-09 NOTE — Assessment & Plan Note (Signed)
No reported flares since last visit.  Follow.  Continues allopurinol.

## 2024-11-13 ENCOUNTER — Other Ambulatory Visit: Payer: Self-pay | Admitting: Gastroenterology

## 2024-11-13 DIAGNOSIS — K7460 Unspecified cirrhosis of liver: Secondary | ICD-10-CM

## 2024-11-14 LAB — OPHTHALMOLOGY REPORT-SCANNED

## 2024-11-20 ENCOUNTER — Ambulatory Visit: Admission: RE | Admit: 2024-11-20 | Source: Home / Self Care | Admitting: Gastroenterology

## 2024-11-20 ENCOUNTER — Telehealth: Payer: Self-pay

## 2024-11-20 NOTE — Progress Notes (Signed)
 Complex Care Management Note Care Guide Note  11/20/2024 Name: Cameron Sanchez MRN: [id number] DOB: 1971/03/08   Complex Care Management Outreach Attempts: An unsuccessful telephone outreach was attempted today to offer the patient information about available complex care management services.  Follow Up Plan:  Additional outreach attempts will be made to offer the patient complex care management information and services.   Encounter Outcome:  Patient Request to Call Back  Dreama [facility]wood [facility]  [facility], [facility] VBCI Assistant Direct Dial: [phone]  Fax: [phone]

## 2024-11-22 ENCOUNTER — Ambulatory Visit
Admission: RE | Admit: 2024-11-22 | Discharge: 2024-11-22 | Disposition: A | Discharge status: Home / Self Care | Source: Ambulatory Visit | Attending: Gastroenterology | Admitting: Gastroenterology

## 2024-11-22 DIAGNOSIS — K7460 Unspecified cirrhosis of liver: Secondary | ICD-10-CM | POA: Insufficient documentation

## 2024-11-22 NOTE — Progress Notes (Signed)
 Complex Care Management Note  Care Guide Note 11/22/2024 Name: Cameron Sanchez MRN: [id number] DOB: 06-03-1971  Cameron Sanchez is a 54 y.o. year old male who sees Glendia Shad, MD for primary care. I reached out to [name] by phone today to offer complex care management services.  [name] was given information about Complex Care Management services today including:   The Complex Care Management services include support from the care team which includes your Nurse Care Manager, Clinical Social Worker, or Pharmacist.  The Complex Care Management team is here to help remove barriers to the health concerns and goals most important to you. Complex Care Management services are voluntary, and the patient may decline or stop services at any time by request to their care team member.   Complex Care Management Consent Status: Patient agreed to services and verbal consent obtained.   Follow up plan:  Telephone appointment with complex care management team member scheduled for:  11/27/24 at 2:00 p.m.   Encounter Outcome:  Patient Scheduled  Dreama [facility]wood [facility]  [facility], [facility] VBCI Assistant Direct Dial: [phone]  Fax: [phone]

## 2024-11-22 NOTE — Progress Notes (Signed)
 Complex Care Management Note Care Guide Note  11/22/2024 Name: Cameron Sanchez MRN: [id number] DOB: Jul 27, 1971   Complex Care Management Outreach Attempts: A second unsuccessful outreach was attempted today to offer the patient with information about available complex care management services.  Follow Up Plan:  Additional outreach attempts will be made to offer the patient complex care management information and services.   Encounter Outcome:  No Answer  Dreama Lynwood [facility]  [facility], [facility] VBCI Assistant Direct Dial: [phone]  Fax: [phone]

## 2024-11-26 ENCOUNTER — Ambulatory Visit: Payer: Self-pay | Admitting: Gastroenterology

## 2024-11-27 ENCOUNTER — Other Ambulatory Visit

## 2024-11-27 DIAGNOSIS — E1149 Type 2 diabetes mellitus with other diabetic neurological complication: Secondary | ICD-10-CM

## 2024-11-27 NOTE — Progress Notes (Deleted)
 Cameron Sanchez

## 2024-11-27 NOTE — Progress Notes (Unsigned)
 "  12/05/2024 Name: Cameron Sanchez MRN: [id number] DOB: 10/23/1971  Subjective  Chief Complaint  Patient presents with   Diabetes    Reason for visit: ?  Cameron Sanchez is a 54 y.o. male with a history of diabetes (type 2), who presents today for an initial diabetes pharmacotherapy visit. Previously seen, lost to follow up. They were referred by their PCP for management of diabetes.  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  Known DM Complications: autonomic neuropathy (ED)  Care Team: Primary Care Provider: Glendia Shad, MD  Medication Access/Adherence: Prescription drug coverage: Payor: [employer] MEDICARE / Plan: [employer] MEDICARE HMO/PPO / Product Type: *No Product type* / .  - Reports that all medications are relatively affordable.  - Current Patient Assistance: Tresiba  PAP - Medication Adherence: Patient denies missing doses of their medication.   Since Last visit / History of Present Illness: ?  Patient reports doing well since last visit. Denies concerns with current insulin  regiment though continues to note perception that metformin  causes reduction in sexual drive and generally prefers not to take. No c/f lows at this time, though notes sugar is elevated on [place] most of the time.   Reported DM Regimen: ?  Tresiba  U200 36 units daily Metformin  XR 500 mg daily (breakfast)   DM medications tried in the past:?  Ozempic  (worsening of baseline constipation at 0.5 mg dose) - on chronic opioid w/o bowel regimen   CGM: Libreview 3+ (sent instructions via MyChart to connect to LibreView) Pt reports BG consistently 265+. Unable to recall specifics.    Hypo/Hyperglycemia: ?  Symptoms of hypoglycemia since last visit:? no  Symptoms of hyperglycemia since last visit:? yes - blurry vision  Reported Diet: Patient typically eats 2 meals per day.  Breakfast: Usually bacon and eggs, sometime hash browns or grits Lunch: Does not eat Dinner:  Very light meal  Snacks: Nighttime - apple, shredded cheese Beverages: Stopped drinking sweet tea at home  Exercise: No formal exercise. 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 no History of albuminuria? no, last UACR on 09/18/24 = unable to calculate ACE/ARB - Not taking ; Urine MA/CR Ratio - <30 mg/g.  Last eye exam: N/A; No retinopathy present Last foot exam: 10/31/2024 Tobacco Use: No history of tobacco use  Immunizations:? Flu: No Record - DUE (Last: ); Pneumococcal: No Record - DUE ; Shingrix: No Record - DUE (Last: , ); TDap: Up to date (Last: 11/29/2020)  Cardiovascular Risk Reduction History of clinical ASCVD? yes. Hx TIA. Aortic atherosclerosis The 10-year ASCVD risk score (Arnett DK, et al., 2019) is: 13.7% History of heart failure? no History of hyperlipidemia? yes Current BMI: 34 kg/m2 (Ht 68 in, Wt 101.3 kg) Taking statin? yes; high intensity (atorvastatin  40 mg daily) Taking aspirin ? indicated (secondary prevention); Not taking   Taking SGLT-2i? no Taking GLP- 1 RA? no      _______________________________________________  Objective     Physical Examination:  Vitals:  Wt Readings from Last 3 Encounters:  10/31/24 223 lb 6.4 oz (101.3 kg)  10/05/24 222 lb (100.7 kg)  09/18/24 224 lb (101.6 kg)   BP Readings from Last 3 Encounters:  10/31/24 128/74  09/18/24 136/78  06/13/24 120/70   Pulse Readings from Last 3 Encounters:  10/31/24 80  09/18/24 72  06/13/24 79     Labs:?  Lab Results  Component Value  Date   HGBA1C 11.8 (H) 09/13/2024   HGBA1C 10.9 (H) 06/11/2024   HGBA1C 11.0 (H) 03/05/2024   GLUCOSE 279 (H) 09/13/2024   MICRALBCREAT Unable to calculate 09/18/2024   CREATININE 0.86 09/13/2024   CREATININE 0.78 06/11/2024   CREATININE 1.03 03/05/2024   GFR 97.58 09/13/2024   GFR 100.69 06/11/2024   GFR 82.17 03/05/2024    Lab Results  Component Value  Date   CHOL 130 09/13/2024   LDLCALC 46 09/13/2024   LDLCALC 47 06/11/2024   LDLCALC 35 03/05/2024   LDLDIRECT 53.0 09/08/2022   LDLDIRECT 142.0 06/21/2019   HDL 44.90 09/13/2024   TRIG 198.0 (H) 09/13/2024   TRIG 135.0 06/11/2024   TRIG 222.0 (H) 03/05/2024   ALT 25 09/13/2024   ALT 25 06/11/2024   AST 31 09/13/2024   AST 31 06/11/2024      Chemistry      Component Value Date/Time   NA 133 (L) 09/13/2024 0840   NA 137 02/03/2023 1450   NA 137 09/13/2012 2026   K 4.1 09/13/2024 0840   K 3.7 09/13/2012 2026   CL 98 09/13/2024 0840   CL 100 09/13/2012 2026   CO2 26 09/13/2024 0840   CO2 27 09/13/2012 2026   BUN 12 09/13/2024 0840   BUN 10 02/03/2023 1450   BUN 10 09/13/2012 2026   CREATININE 0.86 09/13/2024 0840   CREATININE 1.16 02/18/2017 1547      Component Value Date/Time   CALCIUM  9.2 09/13/2024 0840   CALCIUM  9.7 09/13/2012 2026   ALKPHOS 65 09/13/2024 0840   ALKPHOS 63 09/13/2012 2026   AST 31 09/13/2024 0840   AST 27 09/13/2012 2026   ALT 25 09/13/2024 0840   ALT 71 09/13/2012 2026   BILITOT 0.8 09/13/2024 0840   BILITOT 0.6 02/03/2023 1450   BILITOT 1.5 (H) 09/13/2012 2026     The 10-year ASCVD risk score (Arnett DK, et al., 2019) is: 13.7%  Assessment and Plan:   1. Diabetes, type 2: uncontrolled per last A1c of 11.8% (09/13/24), increased from previous, 10.9%. Goal <7% without hypoglycemia. Utilizing a Libre 3+ to check blood sugar. Sent instructions on how to connect to LibreView. Discussed option to trial weaker GLP1, Trulicity (prior c/f worsening of opioid-induced constipation on Ozempic ). Patient is open, will start process of getting through patient assistance.   Current Regimen: Tresiba  U200 36 units/d, Metformin  XR 500 mg/d  Diet: Has cut out sweet tea and limiting bread intake Increase Tresiba  U200 to 42 units/day (16%??) Continue metformin  XR 500mg /d  Initiate Trulicity PAP Reviewed s/sx/tx hypoglycemia Future Consideration: SGLT2i:  Ideal once A1c closer to euglycemia as to avoid volume depletion in the setting of significant glucosuria.  GLP1-RA: Feels he did not tolerate due to baseline constipation on chronic opioids. Trulicity reasonable in the future if patient amenable. Less effective than Ozempic  though possible better tolerated. Again, he would benefit from bowel regimen given chronic opioid use.  DPP4i: Reasonable if does not want to try Trulicity. May be eligible for PAP.  Metformin : Titration is ideal in the setting of good renal function, eGFR >60.  XR typically well-tolerated. Patient preference not to take metformin  d/t colleague-reported sexual side effects.  SU: Not unreasonable, though defer given alternative options with lower risk of hypoglycemia.  Insulin : Not unreasonable, though defer as last resort or as needed for severe hyperglycemia given risk hypoglycemic. Other safer options at this time.    2. ASCVD (secondary prevention): controlled on last lipid panel with LDL  46 mg/dL, TG 801 mg/dL (89/69/74). LDL goal <55 mg/dL (primary prevention with diabetes and multiple risk factors).  Current Regimen: atorvastatin  40 mg daily Continue medications today without changes.   3. Healthcare Maintenance:  Pneumococcal - Current status: DUE  Shingles - Current status: DUE Influenza - Current status: DUE   Follow Up Patient given direct line for questions regarding medication therapy Reminder set for 2 weeks to follow up on Libre connection to portal/insulin  titration.   Future Appointments  Date Time Provider Department Center  12/26/2024  9:30 AM Katheleen Aleene ORN, RD [facility] None  12/27/2024  8:45 AM [facility] [address]  12/31/2024  9:30 AM Glendia Shad, MD [facility] [address]  10/07/2025  3:40 PM [facility] ANNUAL WELLNESS VISIT [facility] [address] DOROTHA Bridgette, PharmD PGY1 [facility]System Pharmacy Administration and Leadership Resident [facility]  Patient  seen in conjunction with resident above. Visit supervised.   Manuelita FABIENE Kobs, PharmD, BCACP, CPP Clinical Pharmacist Practitioner [facility] Ph: [phone]   "

## 2024-12-04 ENCOUNTER — Encounter: Admitting: Dietician

## 2024-12-04 NOTE — Progress Notes (Signed)
 [facility]                                          MRN: [id number]   12/04/2024   The [facility] Quality Team Specialist reviewed this patient medical record for the purposes of chart review for care gap closure. The following were reviewed: chart review for care gap closure-glycemic status assessment and kidney health evaluation for diabetes:eGFR  and uACR.    [facility]I Quality Team

## 2024-12-05 NOTE — Patient Instructions (Signed)
 [name],   It was a pleasure to speak with you today! As we discussed:?   Increase Tresiba  to 42 units once daily. Hopefully we will start to see those morning sugars closer to goal of less than 130 (before any food or drink)  Continue metformin  as you have been taking it  We discussed the option to try a similar but weaker once-weekly injection  called Trulicity. While it is slightly less effective than Ozempic , we can still see good blood sugar lowering effects and generally do not have as much concern with stomach/bowel side effects like constipation.  There is a program available that may be able to provide you with Trulicity via mail for no cost. We can start this application this week.   _________________________________________ It looks like your [facility] App got disconnected from the clinic.  As discussed, below are instruction on how to reconnect via your smart phone App.   How To Share Your Readings With Us  Once in the app, go to settings -> connected apps -> LibreView -> Enter Practice ID -> [email or web address]    Please reach out prior to your next scheduled appointment should you have any questions or concerns.  You may reach me via MyChart Message, or you may leave me a voicemail at [phone] and I will get back to you shortly.   Thank you!   Future Appointments  Date Time Provider Department Center  12/26/2024  9:30 AM Katheleen Aleene ORN, RD [facility] None  12/27/2024  8:45 AM [facility] [address]  12/31/2024  9:30 AM Glendia Shad, MD [facility] [address]  10/07/2025  3:40 PM [facility] ANNUAL WELLNESS VISIT [facility] [address] Manuelita FABIENE Geronimo, PharmD, [place], CPP Clinical Pharmacist Practitioner [facility] Ph: [phone]

## 2024-12-16 ENCOUNTER — Other Ambulatory Visit: Payer: Self-pay | Admitting: Internal Medicine

## 2024-12-26 ENCOUNTER — Encounter: Admitting: Dietician

## 2024-12-27 ENCOUNTER — Other Ambulatory Visit

## 2024-12-31 ENCOUNTER — Ambulatory Visit: Admitting: Internal Medicine

## 2025-01-17 ENCOUNTER — Ambulatory Visit: Admission: RE | Admit: 2025-01-17 | Source: Home / Self Care | Admitting: Gastroenterology

## 2025-01-17 ENCOUNTER — Encounter: Admission: RE | Payer: Self-pay | Source: Home / Self Care

## 2025-10-07 ENCOUNTER — Ambulatory Visit
